# Patient Record
Sex: Female | Born: 1965 | Race: White | Hispanic: No | Marital: Married | Smoking: Never smoker
Health system: Southern US, Community
[De-identification: ages and names within clinical notes are randomized; demographics above are authoritative.]

## PROBLEM LIST (undated history)

## (undated) DIAGNOSIS — T884XXA Failed or difficult intubation, initial encounter: Secondary | ICD-10-CM

## (undated) DIAGNOSIS — Z803 Family history of malignant neoplasm of breast: Secondary | ICD-10-CM

## (undated) DIAGNOSIS — Z87442 Personal history of urinary calculi: Secondary | ICD-10-CM

## (undated) DIAGNOSIS — T7840XA Allergy, unspecified, initial encounter: Secondary | ICD-10-CM

## (undated) DIAGNOSIS — F410 Panic disorder [episodic paroxysmal anxiety] without agoraphobia: Secondary | ICD-10-CM

## (undated) DIAGNOSIS — C50919 Malignant neoplasm of unspecified site of unspecified female breast: Secondary | ICD-10-CM

## (undated) HISTORY — DX: Malignant neoplasm of unspecified site of unspecified female breast: C50.919

## (undated) HISTORY — DX: Family history of malignant neoplasm of breast: Z80.3

## (undated) HISTORY — PX: BREAST BIOPSY: SHX20

## (undated) HISTORY — PX: COLONOSCOPY: SHX174

## (undated) HISTORY — DX: Allergy, unspecified, initial encounter: T78.40XA

## (undated) HISTORY — DX: Panic disorder (episodic paroxysmal anxiety): F41.0

---

## 1997-09-13 ENCOUNTER — Other Ambulatory Visit: Admission: RE | Admit: 1997-09-13 | Discharge: 1997-09-13 | Payer: Self-pay | Admitting: *Deleted

## 1998-02-13 ENCOUNTER — Other Ambulatory Visit: Admission: RE | Admit: 1998-02-13 | Discharge: 1998-02-13 | Payer: Self-pay | Admitting: *Deleted

## 1998-11-13 ENCOUNTER — Other Ambulatory Visit: Admission: RE | Admit: 1998-11-13 | Discharge: 1998-11-13 | Payer: Self-pay | Admitting: *Deleted

## 1998-11-29 ENCOUNTER — Ambulatory Visit (HOSPITAL_COMMUNITY): Admission: RE | Admit: 1998-11-29 | Discharge: 1998-11-29 | Payer: Self-pay | Admitting: Obstetrics and Gynecology

## 1999-11-12 ENCOUNTER — Other Ambulatory Visit: Admission: RE | Admit: 1999-11-12 | Discharge: 1999-11-12 | Payer: Self-pay | Admitting: *Deleted

## 2000-07-01 ENCOUNTER — Other Ambulatory Visit: Admission: RE | Admit: 2000-07-01 | Discharge: 2000-07-01 | Payer: Self-pay | Admitting: Obstetrics and Gynecology

## 2001-02-02 ENCOUNTER — Inpatient Hospital Stay (HOSPITAL_COMMUNITY): Admission: AD | Admit: 2001-02-02 | Discharge: 2001-02-05 | Payer: Self-pay | Admitting: Obstetrics and Gynecology

## 2001-02-10 ENCOUNTER — Observation Stay (HOSPITAL_COMMUNITY): Admission: AD | Admit: 2001-02-10 | Discharge: 2001-02-10 | Payer: Self-pay | Admitting: Obstetrics and Gynecology

## 2001-03-11 ENCOUNTER — Other Ambulatory Visit: Admission: RE | Admit: 2001-03-11 | Discharge: 2001-03-11 | Payer: Self-pay | Admitting: Obstetrics and Gynecology

## 2002-04-24 ENCOUNTER — Other Ambulatory Visit: Admission: RE | Admit: 2002-04-24 | Discharge: 2002-04-24 | Payer: Self-pay | Admitting: Obstetrics and Gynecology

## 2003-04-26 ENCOUNTER — Other Ambulatory Visit: Admission: RE | Admit: 2003-04-26 | Discharge: 2003-04-26 | Payer: Self-pay | Admitting: Obstetrics and Gynecology

## 2003-05-15 ENCOUNTER — Encounter: Payer: Self-pay | Admitting: Internal Medicine

## 2004-05-08 ENCOUNTER — Other Ambulatory Visit: Admission: RE | Admit: 2004-05-08 | Discharge: 2004-05-08 | Payer: Self-pay | Admitting: Obstetrics and Gynecology

## 2004-08-04 ENCOUNTER — Ambulatory Visit: Payer: Self-pay | Admitting: Internal Medicine

## 2004-08-11 ENCOUNTER — Ambulatory Visit: Payer: Self-pay | Admitting: Internal Medicine

## 2004-09-10 ENCOUNTER — Other Ambulatory Visit: Admission: RE | Admit: 2004-09-10 | Discharge: 2004-09-10 | Payer: Self-pay | Admitting: Obstetrics and Gynecology

## 2005-07-10 ENCOUNTER — Other Ambulatory Visit: Admission: RE | Admit: 2005-07-10 | Discharge: 2005-07-10 | Payer: Self-pay | Admitting: Obstetrics and Gynecology

## 2006-07-09 ENCOUNTER — Ambulatory Visit: Payer: Self-pay | Admitting: Internal Medicine

## 2006-07-09 LAB — CONVERTED CEMR LAB
ALT: 22 units/L (ref 0–40)
AST: 21 units/L (ref 0–37)
Alkaline Phosphatase: 78 units/L (ref 39–117)
BUN: 9 mg/dL (ref 6–23)
Basophils Relative: 0.2 % (ref 0.0–1.0)
Bilirubin, Direct: 0.2 mg/dL (ref 0.0–0.3)
Calcium: 9 mg/dL (ref 8.4–10.5)
Chloride: 107 meq/L (ref 96–112)
Cholesterol: 154 mg/dL (ref 0–200)
Creatinine, Ser: 0.6 mg/dL (ref 0.4–1.2)
Eosinophils Absolute: 0.1 10*3/uL (ref 0.0–0.6)
GFR calc non Af Amer: 117 mL/min
Glucose, Bld: 76 mg/dL (ref 70–99)
HCT: 37.6 % (ref 36.0–46.0)
Hemoglobin: 13 g/dL (ref 12.0–15.0)
Neutro Abs: 3 10*3/uL (ref 1.4–7.7)
Platelets: 295 10*3/uL (ref 150–400)
TSH: 1.35 microintl units/mL (ref 0.35–5.50)

## 2006-10-07 DIAGNOSIS — J309 Allergic rhinitis, unspecified: Secondary | ICD-10-CM | POA: Insufficient documentation

## 2007-02-02 ENCOUNTER — Ambulatory Visit: Payer: Self-pay | Admitting: Internal Medicine

## 2007-02-02 DIAGNOSIS — R002 Palpitations: Secondary | ICD-10-CM | POA: Insufficient documentation

## 2007-02-11 ENCOUNTER — Encounter: Payer: Self-pay | Admitting: Internal Medicine

## 2007-02-11 ENCOUNTER — Ambulatory Visit: Payer: Self-pay

## 2007-02-11 ENCOUNTER — Telehealth: Payer: Self-pay | Admitting: *Deleted

## 2008-03-20 ENCOUNTER — Ambulatory Visit: Payer: Self-pay | Admitting: Internal Medicine

## 2008-03-20 LAB — CONVERTED CEMR LAB
ALT: 16 units/L (ref 0–35)
Albumin: 4.1 g/dL (ref 3.5–5.2)
Alkaline Phosphatase: 69 units/L (ref 39–117)
Basophils Absolute: 0 10*3/uL (ref 0.0–0.1)
Bilirubin, Direct: 0.1 mg/dL (ref 0.0–0.3)
CO2: 31 meq/L (ref 19–32)
Calcium: 9.1 mg/dL (ref 8.4–10.5)
Chloride: 102 meq/L (ref 96–112)
GFR calc non Af Amer: 84 mL/min
Glucose, Urine, Semiquant: NEGATIVE
HDL: 66.5 mg/dL (ref >39.0)
Hemoglobin: 13 g/dL (ref 12.0–15.0)
LDL Cholesterol: 96 mg/dL (ref 0–99)
Neutro Abs: 3.6 10*3/uL (ref 1.4–7.7)
Nitrite: NEGATIVE
Platelets: 300 10*3/uL (ref 150–400)
RDW: 11.8 % (ref 11.5–14.6)
Sodium: 141 meq/L (ref 135–145)
Specific Gravity, Urine: 1.015
Total Bilirubin: 0.8 mg/dL (ref 0.3–1.2)
VLDL: 9 mg/dL (ref 0–40)
WBC: 6.1 10*3/uL (ref 4.5–10.5)

## 2008-03-27 ENCOUNTER — Ambulatory Visit: Payer: Self-pay | Admitting: Internal Medicine

## 2008-03-27 DIAGNOSIS — F410 Panic disorder [episodic paroxysmal anxiety] without agoraphobia: Secondary | ICD-10-CM | POA: Insufficient documentation

## 2008-03-27 DIAGNOSIS — R3129 Other microscopic hematuria: Secondary | ICD-10-CM | POA: Insufficient documentation

## 2008-03-27 LAB — CONVERTED CEMR LAB
Bilirubin Urine: NEGATIVE
Glucose, Urine, Semiquant: NEGATIVE
Nitrite: NEGATIVE
Protein, U semiquant: NEGATIVE
Urobilinogen, UA: NEGATIVE

## 2008-08-07 ENCOUNTER — Encounter: Payer: Self-pay | Admitting: Internal Medicine

## 2008-08-10 ENCOUNTER — Encounter: Payer: Self-pay | Admitting: Internal Medicine

## 2009-01-28 ENCOUNTER — Encounter: Payer: Self-pay | Admitting: Internal Medicine

## 2009-03-26 ENCOUNTER — Encounter: Payer: Self-pay | Admitting: Internal Medicine

## 2009-04-02 ENCOUNTER — Encounter: Payer: Self-pay | Admitting: Internal Medicine

## 2009-04-08 ENCOUNTER — Encounter: Payer: Self-pay | Admitting: Internal Medicine

## 2009-04-26 ENCOUNTER — Encounter: Payer: Self-pay | Admitting: Internal Medicine

## 2009-05-02 ENCOUNTER — Encounter: Payer: Self-pay | Admitting: Internal Medicine

## 2009-05-02 ENCOUNTER — Ambulatory Visit (HOSPITAL_BASED_OUTPATIENT_CLINIC_OR_DEPARTMENT_OTHER): Admission: RE | Admit: 2009-05-02 | Discharge: 2009-05-02 | Payer: Self-pay | Admitting: General Surgery

## 2009-05-17 ENCOUNTER — Encounter: Payer: Self-pay | Admitting: Internal Medicine

## 2009-09-13 ENCOUNTER — Encounter: Payer: Self-pay | Admitting: Internal Medicine

## 2009-10-07 ENCOUNTER — Encounter: Payer: Self-pay | Admitting: Internal Medicine

## 2009-10-30 ENCOUNTER — Encounter: Admission: RE | Admit: 2009-10-30 | Discharge: 2009-10-30 | Payer: Self-pay | Admitting: Radiology

## 2010-04-24 NOTE — Letter (Signed)
Summary: [facility]   Imported By: Maryln Gottron 06/12/2009 10:52:02  _____________________________________________________________________  External Attachment:    Type:   Image     Comment:   External Document

## 2010-04-24 NOTE — Op Note (Signed)
Summary: Specimen Radiograph, Needle Localization-Addended Report  Specimen Radiograph, Needle Localization-Addended Report   Imported By: Maryln Gottron 05/07/2009 11:28:16  _____________________________________________________________________  External Attachment:    Type:   Image     Comment:   External Document

## 2010-04-24 NOTE — Op Note (Signed)
Summary: Specimen Radiograph, Needle Localization/[facility]  Specimen Radiograph, Needle Localization/[facility]   Imported By: Maryln Gottron 05/07/2009 11:21:11  _____________________________________________________________________  External Attachment:    Type:   Image     Comment:   External Document

## 2010-04-24 NOTE — Op Note (Signed)
Summary: Stereotactic Biopsy/[facility]  Stereotactic Biopsy/[facility]   Imported By: Maryln Gottron 04/12/2009 15:02:34  _____________________________________________________________________  External Attachment:    Type:   Image     Comment:   External Document

## 2010-04-24 NOTE — Letter (Signed)
Summary: [facility]   Imported By: Maryln Gottron 05/20/2009 15:30:55  _____________________________________________________________________  External Attachment:    Type:   Image     Comment:   External Document

## 2010-05-20 ENCOUNTER — Ambulatory Visit (INDEPENDENT_AMBULATORY_CARE_PROVIDER_SITE_OTHER): Payer: 59 | Admitting: Internal Medicine

## 2010-05-20 ENCOUNTER — Encounter: Payer: Self-pay | Admitting: Internal Medicine

## 2010-05-20 VITALS — BP 110/60 | HR 72 | Temp 98.3°F | Resp 14 | Ht 67.0 in | Wt 144.0 lb

## 2010-05-20 DIAGNOSIS — N9489 Other specified conditions associated with female genital organs and menstrual cycle: Secondary | ICD-10-CM

## 2010-05-20 DIAGNOSIS — J309 Allergic rhinitis, unspecified: Secondary | ICD-10-CM

## 2010-05-20 DIAGNOSIS — R102 Pelvic and perineal pain: Secondary | ICD-10-CM

## 2010-05-20 DIAGNOSIS — F410 Panic disorder [episodic paroxysmal anxiety] without agoraphobia: Secondary | ICD-10-CM

## 2010-05-20 LAB — POCT URINALYSIS DIPSTICK
Glucose, UA: NEGATIVE
Ketones, UA: NEGATIVE
Leukocytes, UA: NEGATIVE
Nitrite, UA: NEGATIVE
Urobilinogen, UA: 1
pH, UA: 7

## 2010-05-20 NOTE — Assessment & Plan Note (Signed)
Controlled  Under care Dr Nolen Mu

## 2010-05-20 NOTE — Assessment & Plan Note (Addendum)
?   Rectal pressure ? Pelvic pressure but no bowel or bladder dysfunction at present.    Rectal exam normal . / if radiating from back and atypical  Or some type of prolapse.   Get her  Gyne to get her exam    And then go from there  .

## 2010-05-20 NOTE — Patient Instructions (Signed)
Will facilitate appt with your gyne to evaluate this problem .

## 2010-05-20 NOTE — Assessment & Plan Note (Signed)
Quiescent

## 2010-05-20 NOTE — Progress Notes (Signed)
  Subjective:    Patient ID: Amanda Williams, female    DOB: [birth year], 45 y.o.   MRN: [id number]  HPI Patient comes in today  For a new problem Her last visit was  Over a year ago .  She has had no change in med status.   Sees GYne Dr Estanislado Pandy    Poss last fall.  Has had insidious onset of  Pelvic pressure pain after sitting and and end of day only relieved by laying down.  No radiation  More rectal than abd area .   Better in am .  NO rx noted .    Stable anxiety panic on zoloft  Review of Systems Neg cv pulm Gi changes  . Periods and no urinary changes.   No blood in stool. No back pain or neuro sx .  rst of ros [place]  Has hx of microscopic  Hematuria. .    Objective:   Physical Exam WDWN in nad  HEENT no Williams abnormality NEck no masses  Or tenderness Chest:  Clear to A&P without wheezes rales or rhonchi CV:  S1-S2 no gallops or murmurs peripheral perfusion is normal Abdomen:  Sof,t normal bowel sounds without hepatosplenomegaly, no guarding rebound or masses no CVA tenderness no heernia Rectal no  Masses or prolapse   Stool neg  Hem  Nl tone Neuro  Non focal and nl weakness MS back non tender  No atrophy .     Assessment & Plan:  Pelvic perineal pressure sx   / cause   associated with sitting long times   ? If atypical back radiation  R/o pelvic process.    Sx  Strategy   Get her Gyne to evaluated.

## 2010-06-03 ENCOUNTER — Other Ambulatory Visit: Payer: Self-pay | Admitting: Dermatology

## 2010-06-12 LAB — BASIC METABOLIC PANEL
BUN: 8 mg/dL (ref 6–23)
Chloride: 104 mEq/L (ref 96–112)

## 2010-06-12 LAB — DIFFERENTIAL
Basophils Relative: 0 % (ref 0–1)
Eosinophils Absolute: 0 10*3/uL (ref 0.0–0.7)
Lymphocytes Relative: 38 % (ref 12–46)
Lymphs Abs: 2.6 10*3/uL (ref 0.7–4.0)
Neutro Abs: 3.8 10*3/uL (ref 1.7–7.7)

## 2010-06-12 LAB — CBC
HCT: 37.2 % (ref 36.0–46.0)
MCV: 90.4 fL (ref 78.0–100.0)
Platelets: 285 10*3/uL (ref 150–400)
RDW: 12.8 % (ref 11.5–15.5)
WBC: 6.9 10*3/uL (ref 4.0–10.5)

## 2010-07-16 ENCOUNTER — Other Ambulatory Visit: Payer: Self-pay | Admitting: Dermatology

## 2010-08-08 NOTE — H&P (Signed)
[facility]  Patient:    Amanda Williams, Amanda Williams Visit Number: [id number] MRN: [id number]          Service Type: OBS Location: [id number] Attending Physician:  Lenoard Aden Dictated by:   Lenoard Aden, M.D. Admit Date:  02/02/2001   CC:         [facility]   History and Physical  CHIEF COMPLAINT:              Labor.  HISTORY OF PRESENT ILLNESS:   A 45 year old white female, G3, P1, EDD January 26, 2001, at [redacted] weeks gestation who presents in active labor.  Past obstetrical history remarkable for vacuum-assisted vaginal delivery of a female in 1998, [facility] for partial molar pregnancy in September 2000.  ALLERGIES:                    PENICILLIN and SULFA.  MEDICATIONS:                  Prenatal vitamins.  PRENATAL LAB COURSE:          Otherwise uncomplicated.  Patient is advanced maternal age and declined amniocentesis, had a normal triple screen and 20-week ultrasound.  Prenatal lab data reveals a blood type of O positive, Rh antibody negative, rubella immune, hepatitis B surface antigen negative, HA nonreactive.  GC and Chlamydia negative.  GBS negative.  FAMILY HISTORY:               Noncontributory.  SOCIAL HISTORY:               Noncontributory.  PHYSICAL EXAMINATION:  GENERAL:                      She is a well-developed, well-nourished white female in no apparent distress.  HEENT:                        Normal.  LUNGS:                        Clear.  HEART:                        Regular rhythm.  ABDOMEN:                      Soft, gravid, nontender.  Estimated fetal weight on ultrasound 8-1/2 pounds.  PELVIC:                       Cervix is 8 cm, 100% vertex, and 0.  Artificial rupture of membranes clear.  EXTREMITIES:                  No cords.  NEUROLOGIC:                   Exam is nonfocal.  IMPRESSION:                   A 41-week intrauterine pregnancy in active labor.  PLAN:                         Admit and  anticipate attempts at vaginal delivery. Dictated by:   Lenoard Aden, M.D. Attending Physician:  Lenoard Aden DD:  02/02/01 TD:  02/02/01 Job: [id number]

## 2010-10-10 ENCOUNTER — Other Ambulatory Visit: Payer: Self-pay

## 2010-10-10 DIAGNOSIS — Z0289 Encounter for other administrative examinations: Secondary | ICD-10-CM

## 2010-10-17 ENCOUNTER — Ambulatory Visit (INDEPENDENT_AMBULATORY_CARE_PROVIDER_SITE_OTHER): Payer: 59 | Admitting: Internal Medicine

## 2010-10-17 ENCOUNTER — Encounter: Payer: Self-pay | Admitting: Internal Medicine

## 2010-10-17 VITALS — BP 100/70 | HR 76 | Temp 98.4°F | Ht 66.75 in | Wt 142.0 lb

## 2010-10-17 DIAGNOSIS — Z0000 Encounter for general adult medical examination without abnormal findings: Secondary | ICD-10-CM

## 2010-10-17 DIAGNOSIS — R922 Inconclusive mammogram: Secondary | ICD-10-CM

## 2010-10-17 LAB — POCT URINALYSIS DIPSTICK
Bilirubin, UA: NEGATIVE
Glucose, UA: NEGATIVE
Leukocytes, UA: NEGATIVE
Protein, UA: NEGATIVE
Spec Grav, UA: 1.01

## 2010-10-17 LAB — CBC WITH DIFFERENTIAL/PLATELET
Eosinophils Absolute: 0.1 10*3/uL (ref 0.0–0.7)
Eosinophils Relative: 1.2 % (ref 0.0–5.0)
HCT: 38.4 % (ref 36.0–46.0)
Monocytes Absolute: 0.4 10*3/uL (ref 0.1–1.0)
Neutro Abs: 3.9 10*3/uL (ref 1.4–7.7)
Neutrophils Relative %: 61.5 % (ref 43.0–77.0)
RBC: 4.24 Mil/uL (ref 3.87–5.11)
RDW: 13.3 % (ref 11.5–14.6)

## 2010-10-17 LAB — BASIC METABOLIC PANEL
CO2: 28 mEq/L (ref 19–32)
Calcium: 8.6 mg/dL (ref 8.4–10.5)
Creatinine, Ser: 0.7 mg/dL (ref 0.4–1.2)
GFR: 92.9 mL/min (ref >60.00)
Glucose, Bld: 92 mg/dL (ref 70–99)

## 2010-10-17 LAB — LIPID PANEL
Cholesterol: 162 mg/dL (ref 0–200)
LDL Cholesterol: 94 mg/dL (ref 0–99)
Total CHOL/HDL Ratio: 3
VLDL: 11.8 mg/dL (ref 0.0–40.0)

## 2010-10-17 LAB — HEPATIC FUNCTION PANEL
ALT: 13 U/L (ref 0–35)
AST: 16 U/L (ref 0–37)
Alkaline Phosphatase: 81 U/L (ref 39–117)

## 2010-10-17 LAB — TSH: TSH: 1.2 u[IU]/mL (ref 0.35–5.50)

## 2010-10-17 NOTE — Progress Notes (Signed)
  Subjective:    Patient ID: Amanda Williams, female    DOB: Oct 09, 1965, 45 y.o.   MRN: [id number]  HPI Patient comes in today for Preventive Health Care visit  Since last visit no major changes in health. Gets mamo for dense breast and b q 6 months and then early  To get gyne check and pap in fall. Pelvic pressure got better  Has had eval and  blood in ua    Sees DR Nolen Mu yearly and doing well on same dose of zoloft. Review of Systems ROS:  GEN/ HEENTNo fever, significant weight changes sweats headaches vision problems hearing changes, CV/ PULM; No chest pain shortness of breath cough, syncope,edema  change in exercise tolerance. GI /GU: No adominal pain, vomiting, change in bowel habits. No blood in the stool. No significant GU symptoms. SKIN/HEME: ,no acute skin rashes suspicious lesions or bleeding. No lymphadenopathy, nodules, masses.  NEURO/ PSYCH:  No neurologic signs such as weakness numbness No depression anxiety. IMM/ Allergy: No unusual infections.  Allergy .  Mild hay fever.  REST of 12 system review negative  Past history family history social history reviewed in the electronic medical record.     Objective:   Physical Exam Physical Exam: Vital signs reviewed WUJ:WJXB is a well-developed well-nourished alert cooperative  white female who appears her stated age in no acute distress.  HEENT: normocephalic  traumatic , Eyes: PERRL EOM's full, conjunctiva clear, Nares: paten,t no deformity discharge or tenderness., Ears: no deformity EAC's clear TMs with normal landmarks. Mouth: clear OP, no lesions, edema.  Moist mucous membranes. Dentition in adequate repair. NECK: supple without masses, thyromegaly or bruits. CHEST/PULM:  Clear to auscultation and percussion breath sounds equal no wheeze , rales or rhonchi. No chest wall deformities or tenderness. Breast: normal by inspection . No dimpling, discharge, masses, tenderness or discharge . Lumpy  Left breast with wellhealed scar   CV: PMI is nondisplaced, S1 S2 no gallops, murmurs, rubs. Peripheral pulses are full without delay.No JVD .  ABDOMEN: Bowel sounds normal nontender  No guard or rebound, no hepato splenomegal no CVA tenderness.  No hernia. Extremtities:  No clubbing cyanosis or edema, no acute joint swelling or redness no focal atrophy NEURO:  Oriented x3, cranial nerves 3-12 appear to be intact, no obvious focal weakness,gait within normal limits no abnormal reflexes or asymmetrical SKIN: No acute rashes normal turgor, color, no bruising or petechiae. Sun changes  PSYCH: Oriented, good eye contact, no obvious depression anxiety, cognition and judgment appear normal. LN: no cervical axillary inguinal adenopathy     Assessment & Plan:   Preventive Health Care Counseled regarding healthy nutrition, exercise, sleep, injury prevention, calcium vit d and healthy weight . UTD mammo pap and tdap Hx of dense breast.   To call uro for appropriate fu visit when due Depression stable  On meds

## 2010-10-17 NOTE — Patient Instructions (Signed)
Will notify you  of labs when available. Continue lifestyle intervention healthy eating and add  exercise .  Check up in 1-2 years  And reg GYNE check.

## 2010-10-18 ENCOUNTER — Encounter: Payer: Self-pay | Admitting: Internal Medicine

## 2010-10-18 DIAGNOSIS — R922 Inconclusive mammogram: Secondary | ICD-10-CM | POA: Insufficient documentation

## 2010-10-18 DIAGNOSIS — Z0000 Encounter for general adult medical examination without abnormal findings: Secondary | ICD-10-CM | POA: Insufficient documentation

## 2010-10-18 DIAGNOSIS — R9230 Dense breasts, unspecified: Secondary | ICD-10-CM | POA: Insufficient documentation

## 2010-10-20 ENCOUNTER — Encounter: Payer: Self-pay | Admitting: *Deleted

## 2011-04-24 ENCOUNTER — Encounter: Payer: Self-pay | Admitting: Internal Medicine

## 2012-01-27 ENCOUNTER — Ambulatory Visit: Payer: 59 | Admitting: Obstetrics and Gynecology

## 2012-04-12 ENCOUNTER — Encounter: Payer: Self-pay | Admitting: Internal Medicine

## 2012-04-12 ENCOUNTER — Ambulatory Visit (INDEPENDENT_AMBULATORY_CARE_PROVIDER_SITE_OTHER): Payer: BC Managed Care – PPO | Admitting: Internal Medicine

## 2012-04-12 VITALS — BP 104/76 | HR 63 | Temp 98.0°F | Wt 150.0 lb

## 2012-04-12 DIAGNOSIS — R6889 Other general symptoms and signs: Secondary | ICD-10-CM | POA: Insufficient documentation

## 2012-04-12 LAB — HEPATIC FUNCTION PANEL
ALT: 17 U/L (ref 0–35)
AST: 18 U/L (ref 0–37)
Albumin: 4.2 g/dL (ref 3.5–5.2)

## 2012-04-12 LAB — CBC WITH DIFFERENTIAL/PLATELET
Basophils Absolute: 0.1 10*3/uL (ref 0.0–0.1)
Basophils Relative: 0.9 % (ref 0.0–3.0)
Eosinophils Relative: 1.7 % (ref 0.0–5.0)
HCT: 38.3 % (ref 36.0–46.0)
Lymphocytes Relative: 36 % (ref 12.0–46.0)
MCHC: 32.7 g/dL (ref 30.0–36.0)
MCV: 90.3 fl (ref 78.0–100.0)
Monocytes Absolute: 0.5 10*3/uL (ref 0.1–1.0)
Monocytes Relative: 7.2 % (ref 3.0–12.0)
Neutrophils Relative %: 54.2 % (ref 43.0–77.0)
RDW: 12.9 % (ref 11.5–14.6)

## 2012-04-12 LAB — SEDIMENTATION RATE: Sed Rate: 13 mm/hr (ref 0–22)

## 2012-04-12 LAB — T4, FREE: Free T4: 0.8 ng/dL (ref 0.60–1.60)

## 2012-04-12 NOTE — Progress Notes (Signed)
Chief Complaint  Patient presents with  . throat pressure    HPI: Patient comes in today for SDA for  new problem evaluation. Last visit was 7 12 about 18 months ago . She is generally well sees her OB/GYN and Dr. Nolen Mu yearly for med check. She was in her usual state of health until she insidiously noted. "Feeling like lump in throat" for 3-4 weeks  No breathing eating or  reflis sx  Not going away like a pressure  Not sudden sitting working  As cpA    . No choking no heartburn symptoms cough respiratory infection fever or true dysphasia. She's not sure if it's positional or not she thinks it feels a little better when she gets up in the morning.  On zoloft   For suppression of panic attacks and does hurt well with this. She has not had these symptoms before. She did have heartburn of pregnancy. She's not taking any medicine. Family history is negative for thyroid disease. ROS: See pertinent positives and negatives per HPI. No flare of allergies panic.  Past Medical History  Diagnosis Date  . Allergy   . Panic attack     echo 2009 normal  . Hematuria     ? trigonitis    No family history on file.  History   Social History  . Marital Status: Married    Spouse Name: N/A    Number of Children: N/A  . Years of Education: N/A   Social History Main Topics  . Smoking status: Never Smoker   . Smokeless tobacco: None  . Alcohol Use: None  . Drug Use: None  . Sexually Active: None   Other Topics Concern  . 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 Has smoke detector and wears seat belts.  No firearms. No excess sun exposure. Sees dentist regularly .     Outpatient Encounter Prescriptions as of 04/12/2012  Medication Sig Dispense Refill  . sertraline (ZOLOFT) 100 MG tablet Take 100 mg by mouth daily.          EXAM:  BP 104/76  Pulse 63  Temp 98 F (36.7 C) (Oral)  Wt 150 lb (68.04 kg)   SpO2 97%  LMP 03/25/2012  There is no height on file to calculate BMI.  GENERAL: vitals reviewed and listed above, alert, oriented, appears well hydrated and in no acute distress  HEENT: atraumatic, conjunctiva  clear, no obvious abnormalities on inspection of external nose and ears TMs are intact OP : no lesion edema or exudate  no edema is noted no drainage face nontender  NECK: no obvious masses on inspection   no significant adenopathy no bruit she points to the suprasternal notch with her is somewhat of a fullness but cannot tell if she has thyromegaly the isthmus  no supraclavicular adenopathy LUNGS: clear to auscultation bilaterally, no wheezes, rales or rhonchi, good air movement  CV: HRRR, no clubbing cyanosis or  peripheral edema nl cap refill  Abdomen soft without organomegaly guarding or rebound. MS: moves all extremities without noticeable focal  abnormality  PSYCH: pleasant and cooperative, no obvious depression or anxiety  ASSESSMENT AND PLAN:  Discussed the following assessment and plan:  1. Throat symptom  Basic metabolic panel, CBC with Differential, Hepatic function panel, TSH, T4, free, T3, free, Sedimentation rate, US Soft Tissue Head/Neck  Essentially normal exam except possibly borderline thyroid enlargement check labs today ultrasound if negative we'll follow more evaluation if progressive other    -Patient advised to return or notify health care team  immediately if symptoms worsen or persist or new concerns arise.  Patient Instructions  Your exam is good today.  We will get thyroid tests and blood count and arrange for an ultrasound of the thyroid gland to make sure that is not enlarged as the cause of your symptoms. If these tests are normal we will observe and have you come back in a couple months if it is continuing or progressing.  Consider getting ear nose and throat Dr. For  exam if needed.  Occasionally silent reflux could cause this.  Will notify you   of labs when available. Can sign up for My CHart    Neta Mends. Panosh M.D.

## 2012-04-12 NOTE — Patient Instructions (Addendum)
Your exam is good today.  We will get thyroid tests and blood count and arrange for an ultrasound of the thyroid gland to make sure that is not enlarged as the cause of your symptoms. If these tests are normal we will observe and have you come back in a couple months if it is continuing or progressing.  Consider getting ear nose and throat Dr. For  exam if needed.  Occasionally silent reflux could cause this.  Will notify you  of labs when available. Can sign up for My CHart

## 2012-04-14 ENCOUNTER — Ambulatory Visit: Payer: BC Managed Care – PPO

## 2012-04-14 DIAGNOSIS — R6889 Other general symptoms and signs: Secondary | ICD-10-CM

## 2012-04-14 LAB — BASIC METABOLIC PANEL
BUN: 9 mg/dL (ref 6–23)
CO2: 24 mEq/L (ref 19–32)
Chloride: 106 mEq/L (ref 96–112)
GFR: 93.8 mL/min (ref >60.00)

## 2012-04-14 NOTE — Addendum Note (Signed)
Addended by: Rita Ohara R on: 04/14/2012 09:59 AM   Modules accepted: Orders

## 2012-04-15 ENCOUNTER — Encounter: Payer: Self-pay | Admitting: Family Medicine

## 2012-04-15 ENCOUNTER — Ambulatory Visit
Admission: RE | Admit: 2012-04-15 | Discharge: 2012-04-15 | Disposition: A | Discharge status: Home / Self Care | Payer: BC Managed Care – PPO | Source: Ambulatory Visit | Attending: Internal Medicine | Admitting: Internal Medicine

## 2012-04-15 DIAGNOSIS — R6889 Other general symptoms and signs: Secondary | ICD-10-CM

## 2012-04-18 ENCOUNTER — Encounter: Payer: Self-pay | Admitting: Family Medicine

## 2012-04-19 ENCOUNTER — Ambulatory Visit: Payer: 59 | Admitting: Obstetrics and Gynecology

## 2012-08-03 ENCOUNTER — Telehealth: Payer: Self-pay | Admitting: Internal Medicine

## 2012-08-03 NOTE — Telephone Encounter (Signed)
PT stated that she would like to obtain an antibiotic prior to traveling to [place]. Please assist.

## 2012-08-03 NOTE — Telephone Encounter (Signed)
The patient will need to be seen in the office.  Was seen once recently for a sore throat and not seen before that since 2012.  Please call the patient and make this appt.  Thanks!!

## 2012-08-04 ENCOUNTER — Encounter: Payer: Self-pay | Admitting: Internal Medicine

## 2012-08-04 ENCOUNTER — Ambulatory Visit (INDEPENDENT_AMBULATORY_CARE_PROVIDER_SITE_OTHER): Payer: BC Managed Care – PPO | Admitting: Internal Medicine

## 2012-08-04 VITALS — BP 110/76 | HR 59 | Temp 98.1°F | Wt 148.0 lb

## 2012-08-04 DIAGNOSIS — Z209 Contact with and (suspected) exposure to unspecified communicable disease: Secondary | ICD-10-CM

## 2012-08-04 DIAGNOSIS — Z7184 Encounter for health counseling related to travel: Secondary | ICD-10-CM

## 2012-08-04 DIAGNOSIS — Z7189 Other specified counseling: Secondary | ICD-10-CM

## 2012-08-04 DIAGNOSIS — Z299 Encounter for prophylactic measures, unspecified: Secondary | ICD-10-CM

## 2012-08-04 DIAGNOSIS — Z23 Encounter for immunization: Secondary | ICD-10-CM

## 2012-08-04 MED ORDER — CIPROFLOXACIN HCL 500 MG PO TABS: 500.0000 mg | ORAL_TABLET | Freq: Two times a day (BID) | ORAL | Status: DC

## 2012-08-04 NOTE — Progress Notes (Signed)
Chief Complaint  Patient presents with  . Patient leaving the country.    Going to [place] for vacation.    HPI: Patient comes in today for evaluation for travel. He is to go to [place] for about 10 days beginning the end of the month. She looked on the website at the [facility]. They're not going [place] but staying at [employer] and diving. Wanted to check immunizations and other advice. She's doing well this is a anniversary trip.  Has not had hepatitis A vaccine uncertain if she her had hepatitis B vaccine.  Continuing same medicines sertraline. No new medication. Is allergic to sulfa and penicillin doesn't know if she's ever had Cipro ROS: See pertinent positives and negatives per HPI.  Past Medical History  Diagnosis Date  . Allergy   . Panic attack     echo 2009 normal  . Hematuria     ? trigonitis    No family history on file.  History   Social History  . Marital Status: Married    Spouse Name: N/A    Number of Children: N/A  . Years of Education: N/A   Social History Main Topics  . Smoking status: Never Smoker   . Smokeless tobacco: None  . Alcohol Use: None  . Drug Use: None  . Sexually Active: None   Other Topics Concern  . None   Social History Narrative   hhof  4    Pets dog and cat.   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    Has smoke detector and wears seat belts.  No firearms. No excess sun exposure. Sees dentist regularly .           Outpatient Encounter Prescriptions as of 08/04/2012  Medication Sig Dispense Refill  . ciprofloxacin (CIPRO) 500 MG tablet Take 1 tablet (500 mg total) by mouth 2 (two) times daily. If needed for travelers diarrhea.  6 tablet  0  . sertraline (ZOLOFT) 100 MG tablet Take 100 mg by mouth daily.        No facility-administered encounter medications on file as of 08/04/2012.    EXAM:  BP 110/76  Pulse 59  Temp(Src) 98.1 F (36.7 C) (Oral)  Wt  148 lb (67.132 kg)  BMI 23.37 kg/m2  SpO2 98%  LMP 07/29/2012  Body mass index is 23.37 kg/(m^2).  GENERAL: vitals reviewed and listed above, alert, oriented, appears well hydrated and in no acute distress  MPSYCH: pleasant and cooperative, no obvious depression or anxiety  ASSESSMENT AND PLAN:  Discussed the following assessment and plan:  Need for hepatitis A and B vaccination - Plan: Hepatitis A hepatitis B combined vaccine IM  -Patient advised to return or notify health care team  if symptoms worsen or persist or new concerns arise.  Patient Instructions  Can begin antibiotic if needed for  Travelers diarrhea   Begin twin rix   Hep a and hep b    Series of 3      Travelers' Diarrhea Travelers' diarrhea (TD) is the most common illness affecting travelers. Each year many travelers develop diarrhea. TD usually occurs within the first week of travel. However, it may occur at any time while traveling. It may even occur after returning home. The most important risk factor is where you are going. High-risk places are the developing  countries of:  [place].  [place].  The [place].  [place]. High risk people include young adults and those with:  Transplants.  HIV infections.  Medicine that suppresses the immune system.  Inflammatory-bowel disease.  Diabetes.  H-2 blockers or antacids. Attack rates are similar for men and women. The primary source of TD is eating or drinking food or water tainted with feces (stool or bowel movements). CAUSES  Infectious agents are the primary cause of TD. Germs cause almost 80% of TD cases. The most common germ produces:  Watery diarrhea with cramps.  Low-grade or no fever. There are many other bacterial, viral and parasitic pathogens (disease causing "bugs").  SYMPTOMS  Most TD cases begin suddenly. Symptoms include stool that is increased in:  Frequency.  Volume.  Weight. Altered stool consistency also is common.  Typically, you have four to five loose or watery bowel movements each day. Other common symptoms are:  Nausea.  Vomiting.  Diarrhea.  Abdominal cramping.  Bloating.  Fever.  Urgency.  Malaise. Most cases are not dangerous. Most cases go away in 1-2 days without treatment. TD is rarely life threatening. 90% of cases resolve within 1 week. 98% resolve within 1 month. PREVENTION   Avoid foods or beverages purchased from street vendors in high risk countries.  Avoid food from places where unclean conditions are present.  Avoid raw or undercooked meat and seafood.  Avoid raw fruits (e.g., oranges, bananas, avocados) and vegetables unless you peel them yourself.  If handled properly, well-cooked and packaged foods usually are safe. Foods associated with increased risk for TD include:  Tap water.  Ice.  Unpasteurized milk.  Dairy products.  Safe beverages include:  Bottled carbonated beverages.  Hot tea or coffee.  Beer.  Wine.  Boiled water.  Water treated with iodine or chlorine. ANTIBIOTICS ARE NOT RECOMMENDED AS PREVENTION  CDC (Centers for Disease Control) does not recommend antimicrobial drugs (medicine that kill germs) to prevent TD. Several studies show that Pepto-Bismol taken as either 2 tablets 4 times daily, or 2 fluid ounces 4 times daily, reduces the incidence of travelers' diarrhea. People that should avoid Pepto-Bismol include those who are:  Pregnant.  Allergic to aspirin.  Taking anticoagulants medicine (probenecid, methotrexate).  Be informed about potential side effects, in particular about temporary blackening of the tongue and stool, and rarely ringing in the ears. Because of potential adverse side effects, preventative Pepto-Bismol should not be used for more than 3 weeks.  Some antibiotics taken in a once-a-day dose are 90% effective at preventing travelers' diarrhea. However, antibiotics are not recommended as prevention. Routine  antimicrobial prophylaxis increases your risk for:  Adverse reactions.  Infections with resistant organisms.  Antibiotics can increase your susceptibility to resistant bacterial pathogens and provide no protection against either viral or parasitic pathogens. This can give travelers a false sense of security. As a result, strict adherence to preventive measures is encouraged. Pepto-Bismol should be used as an extra effort if prophylaxis is needed. TREATMENT   TD usually is a self-limited disorder. It gets well without treatment. It often goes away without specific treatment. Oral re-hydration is often helpful to replace lost fluids and electrolytes. Clear liquids are routinely recommended for adults. You may be helped with antimicrobial therapy if you develop three or more loose stools in an 8-hour period, especially if associated with:  Nausea.  Vomiting.  Abdominal cramps.  Fever.  Blood in stools.  Antibiotics usually are given for 3-5 days. Pepto-Bismol also may be  used as treatment. Take one fluid ounce, or two 262 mg tablets every 30 minutes, for up to 8 doses in a 24-hour period. This can be repeated on a second day. If diarrhea persists despite therapy, you should be evaluated by a caregiver and treated for possible parasitic infection.  Because drug resistance is a continuing problem and may vary from country to country, professional assistance should be looked for if problems persist.  Antimotility agents (loperamide, diphenoxylate, and paregoric) mostly reduce diarrhea by slowing down the passage of food and drink in the gut. This allows more time for absorption. Some persons believe diarrhea is the body's defense mechanism to minimize contact time between gut pathogens and lining of the bowel. In several studies, antimotility agents have been useful in treating travelers' diarrhea by decreasing the duration of diarrhea. However, these agents should never be used by persons with  fever or bloody diarrhea because they can increase the severity of disease by delaying clearance of causative organisms. Because antimotility agents are now available over the counter, their improper use is of concern. Complications have been reported from the use of these medicines such as:  Toxic megacolon.  Sepsis.  Disseminated intravascular coagulation. SEEK IMMEDIATE MEDICAL CARE IF:   You are unable to keep fluids down.  Vomiting or diarrhea becomes persistent.  Abdominal (belly) pain develops or increases or localizes. (Right sided pain can be appendicitis and left sided pain in adults can be diverticulitis).  You develop an oral temperature above 102 F (38.9 C), or as your caregiver suggests.  Diarrhea becomes excessive or contains blood or mucous.  Excessive weakness, dizziness, fainting or extreme thirst.  Checking weight 2 to 3 times per day in babies and children will help verify adequate fluid replacement. Your caregiver will tell you what loss should concern you or suggest another visit to your personal physician.  Record your weight or your child's weight today. Compare this to your home scale and record all weights and time and date weighed. Try to check weight at the same times every day. Bring this chart to your caregivers if you or your child needs to be seen again. FOR MORE INFORMATION  Travelers should consult with a caregiver before departing on a trip abroad. Information about TD is available from:  Your local or state health departments.  World Science writer [facility]). Other information that may be of interest to travelers can be found at the [facility] Travelers' Health homepage at [employer].gl. Document Released: 02/27/2002 Document Revised: 06/01/2011 Document Reviewed: 05/17/2008 [facility] Patient Information 2013 [place], [place].      Neta Mends. Simcha Farrington M.D.  Total visit > 50% spent counseling and coordinating care

## 2012-08-04 NOTE — Patient Instructions (Signed)
Can begin antibiotic if needed for  Travelers diarrhea   Begin twin rix   Hep a and hep b    Series of 3      Travelers' Diarrhea Travelers' diarrhea (TD) is the most common illness affecting travelers. Each year many travelers develop diarrhea. TD usually occurs within the first week of travel. However, it may occur at any time while traveling. It may even occur after returning home. The most important risk factor is where you are going. High-risk places are the developing countries of:  [place].  [place].  [place].  [place]. High risk people include young adults and those with:  Transplants.  HIV infections.  Medicine that suppresses the immune system.  Inflammatory-bowel disease.  Diabetes.  H-2 blockers or antacids. Attack rates are similar for men and women. The primary source of TD is eating or drinking food or water tainted with feces (stool or bowel movements). CAUSES  Infectious agents are the primary cause of TD. Germs cause almost 80% of TD cases. The most common germ produces:  Watery diarrhea with cramps.  Low-grade or no fever. There are many other bacterial, viral and parasitic pathogens (disease causing "bugs").  SYMPTOMS  Most TD cases begin suddenly. Symptoms include stool that is increased in:  Frequency.  Volume.  Weight. Altered stool consistency also is common. Typically, you have four to five loose or watery bowel movements each day. Other common symptoms are:  Nausea.  Vomiting.  Diarrhea.  Abdominal cramping.  Bloating.  Fever.  Urgency.  Malaise. Most cases are not dangerous. Most cases go away in 1-2 days without treatment. TD is rarely life threatening. 90% of cases resolve within 1 week. 98% resolve within 1 month. PREVENTION   Avoid foods or beverages purchased from street vendors in high risk countries.  Avoid food from places where unclean conditions are present.  Avoid raw or undercooked meat and  seafood.  Avoid raw fruits (e.g., oranges, bananas, avocados) and vegetables unless you peel them yourself.  If handled properly, well-cooked and packaged foods usually are safe. Foods associated with increased risk for TD include:  Tap water.  Ice.  Unpasteurized milk.  Dairy products.  Safe beverages include:  Bottled carbonated beverages.  Hot tea or coffee.  Beer.  Wine.  Boiled water.  Water treated with iodine or chlorine. ANTIBIOTICS ARE NOT RECOMMENDED AS PREVENTION  CDC (Centers for Disease Control) does not recommend antimicrobial drugs (medicine that kill germs) to prevent TD. Several studies show that Pepto-Bismol taken as either 2 tablets 4 times daily, or 2 fluid ounces 4 times daily, reduces the incidence of travelers' diarrhea. People that should avoid Pepto-Bismol include those who are:  Pregnant.  Allergic to aspirin.  Taking anticoagulants medicine (probenecid, methotrexate).  Be informed about potential side effects, in particular about temporary blackening of the tongue and stool, and rarely ringing in the ears. Because of potential adverse side effects, preventative Pepto-Bismol should not be used for more than 3 weeks.  Some antibiotics taken in a once-a-day dose are 90% effective at preventing travelers' diarrhea. However, antibiotics are not recommended as prevention. Routine antimicrobial prophylaxis increases your risk for:  Adverse reactions.  Infections with resistant organisms.  Antibiotics can increase your susceptibility to resistant bacterial pathogens and provide no protection against either viral or parasitic pathogens. This can give travelers a false sense of security. As a result, strict adherence to preventive measures is encouraged. Pepto-Bismol should be used as an extra effort if prophylaxis  is needed. TREATMENT   TD usually is a self-limited disorder. It gets well without treatment. It often goes away without specific  treatment. Oral re-hydration is often helpful to replace lost fluids and electrolytes. Clear liquids are routinely recommended for adults. You may be helped with antimicrobial therapy if you develop three or more loose stools in an 8-hour period, especially if associated with:  Nausea.  Vomiting.  Abdominal cramps.  Fever.  Blood in stools.  Antibiotics usually are given for 3-5 days. Pepto-Bismol also may be used as treatment. Take one fluid ounce, or two 262 mg tablets every 30 minutes, for up to 8 doses in a 24-hour period. This can be repeated on a second day. If diarrhea persists despite therapy, you should be evaluated by a caregiver and treated for possible parasitic infection.  Because drug resistance is a continuing problem and may vary from country to country, professional assistance should be looked for if problems persist.  Antimotility agents (loperamide, diphenoxylate, and paregoric) mostly reduce diarrhea by slowing down the passage of food and drink in the gut. This allows more time for absorption. Some persons believe diarrhea is the body's defense mechanism to minimize contact time between gut pathogens and lining of the bowel. In several studies, antimotility agents have been useful in treating travelers' diarrhea by decreasing the duration of diarrhea. However, these agents should never be used by persons with fever or bloody diarrhea because they can increase the severity of disease by delaying clearance of causative organisms. Because antimotility agents are now available over the counter, their improper use is of concern. Complications have been reported from the use of these medicines such as:  Toxic megacolon.  Sepsis.  Disseminated intravascular coagulation. SEEK IMMEDIATE MEDICAL CARE IF:   You are unable to keep fluids down.  Vomiting or diarrhea becomes persistent.  Abdominal (belly) pain develops or increases or localizes. (Right sided pain can be  appendicitis and left sided pain in adults can be diverticulitis).  You develop an oral temperature above 102 F (38.9 C), or as your caregiver suggests.  Diarrhea becomes excessive or contains blood or mucous.  Excessive weakness, dizziness, fainting or extreme thirst.  Checking weight 2 to 3 times per day in babies and children will help verify adequate fluid replacement. Your caregiver will tell you what loss should concern you or suggest another visit to your personal physician.  Record your weight or your child's weight today. Compare this to your home scale and record all weights and time and date weighed. Try to check weight at the same times every day. Bring this chart to your caregivers if you or your child needs to be seen again. FOR MORE INFORMATION  Travelers should consult with a caregiver before departing on a trip abroad. Information about TD is available from:  Your local or state health departments.  World [employer] writer [facility]). Other information that may be of interest to travelers can be found at the [facility] Travelers' Health homepage at [email or web address]. Document Released: 02/27/2002 Document Revised: 06/01/2011 Document Reviewed: 05/17/2008 [facility] Patient Information 2013 [place], [place].

## 2012-12-28 ENCOUNTER — Encounter: Payer: Self-pay | Admitting: Internal Medicine

## 2013-07-28 LAB — HM MAMMOGRAPHY

## 2013-07-31 ENCOUNTER — Encounter: Payer: Self-pay | Admitting: Internal Medicine

## 2015-08-12 LAB — HM PAP SMEAR

## 2016-05-05 ENCOUNTER — Telehealth: Payer: Self-pay | Admitting: Internal Medicine

## 2016-05-05 NOTE — Telephone Encounter (Signed)
Pt would like to see if you would continue to see her has not been seen since 2014 and state that Dr. Regis Bill said that she did not have to come back every year just only when she needs a CPE. So now she is needing a CPE is it okay to schedule.  Pt would like to have a call.

## 2016-05-05 NOTE — Telephone Encounter (Signed)
Ok to do cpx     Have her bring any   Info  Immunizations  paps or other relevant information tha may have been done elsewhere

## 2016-05-06 NOTE — Telephone Encounter (Signed)
Pt scheduled and will supply information that Dr. Regis Bill requested.

## 2016-05-12 ENCOUNTER — Other Ambulatory Visit (INDEPENDENT_AMBULATORY_CARE_PROVIDER_SITE_OTHER): Payer: 59

## 2016-05-12 DIAGNOSIS — Z0000 Encounter for general adult medical examination without abnormal findings: Secondary | ICD-10-CM | POA: Diagnosis not present

## 2016-05-12 LAB — CBC WITH DIFFERENTIAL/PLATELET
BASOS ABS: 0.1 10*3/uL (ref 0.0–0.1)
Basophils Relative: 0.6 % (ref 0.0–3.0)
Eosinophils Absolute: 0.1 10*3/uL (ref 0.0–0.7)
Eosinophils Relative: 1.3 % (ref 0.0–5.0)
HCT: 38.8 % (ref 36.0–46.0)
HEMOGLOBIN: 13.2 g/dL (ref 12.0–15.0)
LYMPHS ABS: 3.4 10*3/uL (ref 0.7–4.0)
LYMPHS PCT: 41.3 % (ref 12.0–46.0)
MCHC: 34.1 g/dL (ref 30.0–36.0)
MCV: 88.3 fl (ref 78.0–100.0)
MONOS PCT: 6 % (ref 3.0–12.0)
Monocytes Absolute: 0.5 10*3/uL (ref 0.1–1.0)
NEUTROS PCT: 50.8 % (ref 43.0–77.0)
Neutro Abs: 4.2 10*3/uL (ref 1.4–7.7)
Platelets: 329 10*3/uL (ref 150.0–400.0)
RBC: 4.39 Mil/uL (ref 3.87–5.11)
RDW: 12.4 % (ref 11.5–15.5)
WBC: 8.2 10*3/uL (ref 4.0–10.5)

## 2016-05-12 LAB — BASIC METABOLIC PANEL
BUN: 10 mg/dL (ref 6–23)
CALCIUM: 8.9 mg/dL (ref 8.4–10.5)
CO2: 28 mEq/L (ref 19–32)
Chloride: 101 mEq/L (ref 96–112)
Creatinine, Ser: 0.74 mg/dL (ref 0.40–1.20)
GFR: 87.92 mL/min (ref >60.00)
GLUCOSE: 95 mg/dL (ref 70–99)
POTASSIUM: 4.2 meq/L (ref 3.5–5.1)
SODIUM: 137 meq/L (ref 135–145)

## 2016-05-12 LAB — HEPATIC FUNCTION PANEL
ALBUMIN: 4.3 g/dL (ref 3.5–5.2)
ALK PHOS: 75 U/L (ref 39–117)
ALT: 10 U/L (ref 0–35)
AST: 14 U/L (ref 0–37)
Bilirubin, Direct: 0.1 mg/dL (ref 0.0–0.3)
Total Bilirubin: 0.6 mg/dL (ref 0.2–1.2)
Total Protein: 7.1 g/dL (ref 6.0–8.3)

## 2016-05-12 LAB — LIPID PANEL
Cholesterol: 195 mg/dL (ref 0–200)
HDL: 60.2 mg/dL (ref >39.00)
LDL Cholesterol: 114 mg/dL — ABNORMAL HIGH (ref 0–99)
NONHDL: 135
Total CHOL/HDL Ratio: 3
Triglycerides: 107 mg/dL (ref 0.0–149.0)
VLDL: 21.4 mg/dL (ref 0.0–40.0)

## 2016-05-12 LAB — TSH: TSH: 3.23 u[IU]/mL (ref 0.35–4.50)

## 2016-05-19 NOTE — Progress Notes (Signed)
Chief Complaint  Patient presents with  . Annual Exam    HPI: Patient  Amanda Williams  51 y.o. comes in today for Preventive Health Care visit  Last seen 5 2014 reestablishing  Has been pretty well.   Is on Sertraline for panic disorder  And   And oral hrt   For menopausal.  sx .  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  Asks  If can transition over and have me prescribe her sertraline. She denies any specific major side effects and has been on it a while. Asks about Mr. shot for her hepatitis vaccine she got the AB #1 but never got the second and the third. We'll be getting her mammogram in May and scheduled for colonoscopy in March.  Health Maintenance  Topic Date Due  . MAMMOGRAM  07/29/2014  . COLONOSCOPY  04/27/2015  . INFLUENZA VACCINE  10/22/2015  . HIV Screening  05/20/2017 (Originally 04/26/1980)  . TETANUS/TDAP  03/27/2018  . PAP SMEAR  08/12/2018   Health Maintenance Review LIFESTYLE:  Exercise:   Aim 3 x per week walking yoga . Tobacco/ETS: no Alcohol:  Weekend  Sugar beverages: juice .  Sleep: about 7   Drug use: no HH of   3  Dog 2 gp bearded dragon .  Sons in college also  Work:   workin  In home   20 hours  .    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  GEN/ HEENT: No fever, significant weight changes sweats headaches vision problems hearing changes, CV/ PULM; No chest pain shortness of breath cough, syncope,edema  change in exercise tolerance. GI /GU: No adominal pain, vomiting, change in bowel habits. No blood in the stool. No significant GU symptoms. SKIN/HEME: ,no acute skin rashes suspicious lesions or bleeding. No lymphadenopathy, nodules, masses.  NEURO/ PSYCH:  No neurologic signs such as weakness numbness. No depression anxiety. IMM/ Allergy: No unusual infections.   Allergy .   REST of 12 system review negative except as per HPI   Past Medical History:  Diagnosis Date  . Allergy   . Hematuria    ? trigonitis  . Panic attack    echo 2009 normal    Past Surgical History:  Procedure Laterality Date  . BREAST BIOPSY     left  . KIDNEY STONE SURGERY  5 2004    History reviewed. No pertinent family history.  Social History   Social History  . Marital status: Married    Spouse name: N/A  . Number of children: N/A  . Years of education: N/A   Social History Main Topics  . Smoking status: Never Smoker  . Smokeless tobacco: Never Used  . Alcohol use Yes     Comment: ocassionally  . Drug use: No  . Sexual activity: Not Asked   Other Topics Concern  . None   Social History Narrative   hhof  4    Pets dog and cat.   Works 25- 69 .     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    Has smoke detector and wears seat belts.  No firearms. No excess sun exposure. Sees dentist  regularly .           Outpatient Medications Prior to Visit  Medication Sig Dispense Refill  . sertraline (ZOLOFT) 100 MG tablet Take 100 mg by mouth daily.     . ciprofloxacin (CIPRO) 500 MG tablet Take 1 tablet (500 mg total) by mouth 2 (two) times daily. If needed for travelers diarrhea. 6 tablet 0   No facility-administered medications prior to visit.      EXAM:  BP 98/60 (BP Location: Right Arm, Patient Position: Sitting, Cuff Size: Normal)   Pulse 67   Temp 98.2 F (36.8 C) (Oral)   Ht 5' 6" (1.676 m)   Wt 154 lb 4.8 oz (70 kg)   BMI 24.90 kg/m   Body mass index is 24.9 kg/m. Wt Readings from Last 3 Encounters:  05/20/16 154 lb 4.8 oz (70 kg)  08/04/12 148 lb (67.1 kg)  04/12/12 150 lb (68 kg)    Physical Exam: Vital signs reviewed ZOX:WRUE is a well-developed well-nourished alert cooperative    who appearsr stated age in no acute distress.  HEENT: normocephalic atraumatic , Eyes: PERRL EOM's full, conjunctiva clear, Nares:  paten,t no deformity discharge or tenderness., Ears: no deformity EAC's clear TMs with normal landmarks. Mouth: clear OP, no lesions, edema.  Moist mucous membranes. Dentition in adequate repair. NECK: supple without masses, thyromegaly or bruits. CHEST/PULM:  Clear to auscultation and percussion breath sounds equal no wheeze , rales or rhonchi. No chest wall deformities or tenderness. Breast: normal by inspection . No dimpling, discharge, masses, tenderness or discharge . CV: PMI is nondisplaced, S1 S2 no gallops, murmurs, rubs. Peripheral pulses are full without delay.No JVD .  ABDOMEN: Bowel sounds normal nontender  No guard or rebound, no hepato splenomegal no CVA tenderness.  No hernia. Extremtities:  No clubbing cyanosis or edema, no acute joint swelling or redness no focal atrophy NEURO:  Oriented x3, cranial nerves 3-12 appear to be intact, no obvious focal weakness,gait within normal limits no abnormal reflexes or asymmetrical SKIN: No acute rashes normal turgor, color, no bruising or petechiae. PSYCH: Oriented, good eye contact, no obvious depression anxiety, cognition and judgment appear normal. LN: no cervical axillary inguinal adenopathy  Lab Results  Component Value Date   WBC 8.2 05/12/2016   HGB 13.2 05/12/2016   HCT 38.8 05/12/2016   PLT 329.0 05/12/2016   GLUCOSE 95 05/12/2016   CHOL 195 05/12/2016   TRIG 107.0 05/12/2016   HDL 60.20 05/12/2016   LDLCALC 114 (H) 05/12/2016   ALT 10 05/12/2016   AST 14 05/12/2016   NA 137 05/12/2016   K 4.2 05/12/2016   CL 101 05/12/2016   CREATININE 0.74 05/12/2016   BUN 10 05/12/2016   CO2 28 05/12/2016   TSH 3.23 05/12/2016    BP Readings from Last 3 Encounters:  05/20/16 98/60  08/04/12 110/76  04/12/12 104/76    Lab results reviewed with patient   ASSESSMENT AND PLAN:  Discussed the following assessment and plan:  Visit for preventive health examination  Medication management  Need for hepatitis A and B  vaccination  Hormone replacement therapy (HRT) - per gyne  Panic disorder - Well controlled on sertraline okay to transition to PCP for prescription when due. Med management   Has been seeing dr Caprice Beaver now closing practice  Counseled healthy ls Patient Care Team: Burnis Medin, MD as PCP - General Delsa Bern, MD (Obstetrics and Gynecology) Patient Instructions   Continue lifestyle intervention healthy eating and exercise .  Healthy lifestyle includes : At least 150 minutes of exercise weeks  , weight at healthy levels, which is usually   BMI 19-25. Avoid trans fats and processed foods;  Increase fresh fruits and veges to 5 servings per day. And avoid sweet beverages including tea and juice. Mediterranean diet with olive oil and nuts have been noted to be heart and brain healthy . Avoid tobacco products . Limit  alcohol to  7 per week for women and 14 servings for men.  Get adequate sleep . Wear seat belts . Don't text and drive .   Contact us for  Rx for the sertraline when  You   You are funning out to transition prescriber.  Twin rix# 2   And twin rix #3 over 4 months after # 2    Health Maintenance, Female Adopting a healthy lifestyle and getting preventive care can go a long way to promote health and wellness. Talk with your health care provider about what schedule of regular examinations is right for you. This is a good chance for you to check in with your provider about disease prevention and staying healthy. In between checkups, there are plenty of things you can do on your own. Experts have done a lot of research about which lifestyle changes and preventive measures are most likely to keep you healthy. Ask your health care provider for more information. Weight and diet Eat a healthy diet  Be sure to include plenty of vegetables, fruits, low-fat dairy products, and lean protein.  Do not eat a lot of foods high in solid fats, added sugars, or salt.  Get regular  exercise. This is one of the most important things you can do for your health.  Most adults should exercise for at least 150 minutes each week. The exercise should increase your heart rate and make you sweat (moderate-intensity exercise).  Most adults should also do strengthening exercises at least twice a week. This is in addition to the moderate-intensity exercise. Maintain a healthy weight  Body mass index (BMI) is a measurement that can be used to identify possible weight problems. It estimates body fat based on height and weight. Your health care provider can help determine your BMI and help you achieve or maintain a healthy weight.  For females 35 years of age and older:  A BMI below 18.5 is considered underweight.  A BMI of 18.5 to 24.9 is normal.  A BMI of 25 to 29.9 is considered overweight.  A BMI of 30 and above is considered obese. Watch levels of cholesterol and blood lipids  You should start having your blood tested for lipids and cholesterol at 51 years of age, then have this test every 5 years.  You may need to have your cholesterol levels checked more often if:  Your lipid or cholesterol levels are high.  You are older than 51 years of age.  You are at high risk for heart disease. Cancer screening Lung Cancer  Lung cancer screening is recommended for adults 43-18 years old who are at high risk for lung cancer because of a history of smoking.  A yearly low-dose CT scan of the lungs is recommended for people who:  Currently smoke.  Have quit within the past 15 years.  Have at least a 30-pack-year history of smoking. A pack year is smoking an average of one pack of cigarettes a day for 1 year.  Yearly screening should continue until it has been 15 years since you quit.  Yearly screening should stop if you develop a health problem that would prevent you from having lung cancer treatment. Breast Cancer  Practice breast self-awareness. This means  understanding how your breasts normally appear and feel.  It also means doing regular breast self-exams. Let your health care provider know about any changes, no matter how small.  If you are in your 20s or 30s, you should have a clinical breast exam (CBE) by a health care provider every 1-3 years as part of a regular health exam.  If you are 61 or older, have a CBE every year. Also consider having a breast X-ray (mammogram) every year.  If you have a family history of breast cancer, talk to your health care provider about genetic screening.  If you are at high risk for breast cancer, talk to your health care provider about having an MRI and a mammogram every year.  Breast cancer gene (BRCA) assessment is recommended for women who have family members with BRCA-related cancers. BRCA-related cancers include:  Breast.  Ovarian.  Tubal.  Peritoneal cancers.  Results of the assessment will determine the need for genetic counseling and BRCA1 and BRCA2 testing. Cervical Cancer  Your health care provider may recommend that you be screened regularly for cancer of the pelvic organs (ovaries, uterus, and vagina). This screening involves a pelvic examination, including checking for microscopic changes to the surface of your cervix (Pap test). You may be encouraged to have this screening done every 3 years, beginning at age 27.  For women ages 37-65, health care providers may recommend pelvic exams and Pap testing every 3 years, or they may recommend the Pap and pelvic exam, combined with testing for human papilloma virus (HPV), every 5 years. Some types of HPV increase your risk of cervical cancer. Testing for HPV may also be done on women of any age with unclear Pap test results.  Other health care providers may not recommend any screening for nonpregnant women who are considered low risk for pelvic cancer and who do not have symptoms. Ask your health care provider if a screening pelvic exam is  right for you.  If you have had past treatment for cervical cancer or a condition that could lead to cancer, you need Pap tests and screening for cancer for at least 20 years after your treatment. If Pap tests have been discontinued, your risk factors (such as having a new sexual partner) need to be reassessed to determine if screening should resume. Some women have medical problems that increase the chance of getting cervical cancer. In these cases, your health care provider may recommend more frequent screening and Pap tests. Colorectal Cancer  This type of cancer can be detected and often prevented.  Routine colorectal cancer screening usually begins at 51 years of age and continues through 51 years of age.  Your health care provider may recommend screening at an earlier age if you have risk factors for colon cancer.  Your health care provider may also recommend using home test kits to check for hidden blood in the stool.  A small camera at the end of a tube can be used to examine your colon directly (sigmoidoscopy or colonoscopy). This is done to check for the earliest forms of colorectal cancer.  Routine screening usually begins at age 74.  Direct examination of the colon should be repeated every 5-10 years through 51 years of age. However, you may need to be screened more often if early forms of precancerous polyps or  small growths are found. Skin Cancer  Check your skin from head to toe regularly.  Tell your health care provider about any new moles or changes in moles, especially if there is a change in a mole's shape or color.  Also tell your health care provider if you have a mole that is larger than the size of a pencil eraser.  Always use sunscreen. Apply sunscreen liberally and repeatedly throughout the day.  Protect yourself by wearing long sleeves, pants, a wide-brimmed hat, and sunglasses whenever you are outside. Heart disease, diabetes, and high blood pressure  High  blood pressure causes heart disease and increases the risk of stroke. High blood pressure is more likely to develop in:  People who have blood pressure in the high end of the normal range (130-139/85-89 mm Hg).  People who are overweight or obese.  People who are African American.  If you are 68-80 years of age, have your blood pressure checked every 3-5 years. If you are 51 years of age or older, have your blood pressure checked every year. You should have your blood pressure measured twice-once when you are at a hospital or clinic, and once when you are not at a hospital or clinic. Record the average of the two measurements. To check your blood pressure when you are not at a hospital or clinic, you can use:  An automated blood pressure machine at a pharmacy.  A home blood pressure monitor.  If you are between 71 years and 45 years old, ask your health care provider if you should take aspirin to prevent strokes.  Have regular diabetes screenings. This involves taking a blood sample to check your fasting blood sugar level.  If you are at a normal weight and have a low risk for diabetes, have this test once every three years after 51 years of age.  If you are overweight and have a high risk for diabetes, consider being tested at a younger age or more often. Preventing infection Hepatitis B  If you have a higher risk for hepatitis B, you should be screened for this virus. You are considered at high risk for hepatitis B if:  You were born in a country where hepatitis B is common. Ask your health care provider which countries are considered high risk.  Your parents were born in a high-risk country, and you have not been immunized against hepatitis B (hepatitis B vaccine).  You have HIV or AIDS.  You use needles to inject street drugs.  You live with someone who has hepatitis B.  You have had sex with someone who has hepatitis B.  You get hemodialysis treatment.  You take certain  medicines for conditions, including cancer, organ transplantation, and autoimmune conditions. Hepatitis C  Blood testing is recommended for:  Everyone born from [birth year] through 1965.  Anyone with known risk factors for hepatitis C. Sexually transmitted infections (STIs)  You should be screened for sexually transmitted infections (STIs) including gonorrhea and chlamydia if:  You are sexually active and are younger than 50 years of age.  You are older than 51 years of age and your health care provider tells you that you are at risk for this type of infection.  Your sexual activity has changed since you were last screened and you are at an increased risk for chlamydia or gonorrhea. Ask your health care provider if you are at risk.  If you do not have HIV, but are at risk, it may be recommended that  you take a prescription medicine daily to prevent HIV infection. This is called pre-exposure prophylaxis (PrEP). You are considered at risk if:  You are sexually active and do not regularly use condoms or know the HIV status of your partner(s).  You take drugs by injection.  You are sexually active with a partner who has HIV. Talk with your health care provider about whether you are at high risk of being infected with HIV. If you choose to begin PrEP, you should first be tested for HIV. You should then be tested every 3 months for as long as you are taking PrEP. Pregnancy  If you are premenopausal and you may become pregnant, ask your health care provider about preconception counseling.  If you may become pregnant, take 400 to 800 micrograms (mcg) of folic acid every day.  If you want to prevent pregnancy, talk to your health care provider about birth control (contraception). Osteoporosis and menopause  Osteoporosis is a disease in which the bones lose minerals and strength with aging. This can result in serious bone fractures. Your risk for osteoporosis can be identified using a bone density  scan.  If you are 3 years of age or older, or if you are at risk for osteoporosis and fractures, ask your health care provider if you should be screened.  Ask your health care provider whether you should take a calcium or vitamin D supplement to lower your risk for osteoporosis.  Menopause may have certain physical symptoms and risks.  Hormone replacement therapy may reduce some of these symptoms and risks. Talk to your health care provider about whether hormone replacement therapy is right for you. Follow these instructions at home:  Schedule regular health, dental, and eye exams.  Stay current with your immunizations.  Do not use any tobacco products including cigarettes, chewing tobacco, or electronic cigarettes.  If you are pregnant, do not drink alcohol.  If you are breastfeeding, limit how much and how often you drink alcohol.  Limit alcohol intake to no more than 1 drink per day for nonpregnant women. One drink equals 12 ounces of beer, 5 ounces of wine, or 1 ounces of hard liquor.  Do not use street drugs.  Do not share needles.  Ask your health care provider for help if you need support or information about quitting drugs.  Tell your health care provider if you often feel depressed.  Tell your health care provider if you have ever been abused or do not feel safe at home. This information is not intended to replace advice given to you by your health care provider. Make sure you discuss any questions you have with your health care provider. Document Released: 09/22/2010 Document Revised: 08/15/2015 Document Reviewed: 12/11/2014 Elsevier Interactive Patient Education  2017 [place] K. Panosh M.D.

## 2016-05-20 ENCOUNTER — Encounter: Payer: Self-pay | Admitting: Internal Medicine

## 2016-05-20 ENCOUNTER — Ambulatory Visit (INDEPENDENT_AMBULATORY_CARE_PROVIDER_SITE_OTHER): Payer: 59 | Admitting: Internal Medicine

## 2016-05-20 VITALS — BP 98/60 | HR 67 | Temp 98.2°F | Ht 66.0 in | Wt 154.3 lb

## 2016-05-20 DIAGNOSIS — Z0000 Encounter for general adult medical examination without abnormal findings: Secondary | ICD-10-CM | POA: Diagnosis not present

## 2016-05-20 DIAGNOSIS — Z79890 Hormone replacement therapy (postmenopausal): Secondary | ICD-10-CM | POA: Diagnosis not present

## 2016-05-20 DIAGNOSIS — Z79899 Other long term (current) drug therapy: Secondary | ICD-10-CM

## 2016-05-20 DIAGNOSIS — Z23 Encounter for immunization: Secondary | ICD-10-CM

## 2016-05-20 DIAGNOSIS — F410 Panic disorder [episodic paroxysmal anxiety] without agoraphobia: Secondary | ICD-10-CM | POA: Diagnosis not present

## 2016-05-20 NOTE — Patient Instructions (Addendum)
Continue lifestyle intervention healthy eating and exercise . Healthy lifestyle includes : At least 150 minutes of exercise weeks  , weight at healthy levels, which is usually   BMI 19-25. Avoid trans fats and processed foods;  Increase fresh fruits and veges to 5 servings per day. And avoid sweet beverages including tea and juice. Mediterranean diet with olive oil and nuts have been noted to be heart and brain healthy . Avoid tobacco products . Limit  alcohol to  7 per week for women and 14 servings for men.  Get adequate sleep . Wear seat belts . Don't text and drive .   Contact us for  Rx for the sertraline when  You   You are funning out to transition prescriber.  Twin rix# 2   And twin rix #3 over 4 months after # 2    Health Maintenance, Female Adopting a healthy lifestyle and getting preventive care can go a long way to promote health and wellness. Talk with your health care provider about what schedule of regular examinations is right for you. This is a good chance for you to check in with your provider about disease prevention and staying healthy. In between checkups, there are plenty of things you can do on your own. Experts have done a lot of research about which lifestyle changes and preventive measures are most likely to keep you healthy. Ask your health care provider for more information. Weight and diet Eat a healthy diet  Be sure to include plenty of vegetables, fruits, low-fat dairy products, and lean protein.  Do not eat a lot of foods high in solid fats, added sugars, or salt.  Get regular exercise. This is one of the most important things you can do for your health.  Most adults should exercise for at least 150 minutes each week. The exercise should increase your heart rate and make you sweat (moderate-intensity exercise).  Most adults should also do strengthening exercises at least twice a week. This is in addition to the moderate-intensity exercise. Maintain a  healthy weight  Body mass index (BMI) is a measurement that can be used to identify possible weight problems. It estimates body fat based on height and weight. Your health care provider can help determine your BMI and help you achieve or maintain a healthy weight.  For females 31 years of age and older:  A BMI below 18.5 is considered underweight.  A BMI of 18.5 to 24.9 is normal.  A BMI of 25 to 29.9 is considered overweight.  A BMI of 30 and above is considered obese. Watch levels of cholesterol and blood lipids  You should start having your blood tested for lipids and cholesterol at 51 years of age, then have this test every 5 years.  You may need to have your cholesterol levels checked more often if:  Your lipid or cholesterol levels are high.  You are older than 51 years of age.  You are at high risk for heart disease. Cancer screening Lung Cancer  Lung cancer screening is recommended for adults 80-76 years old who are at high risk for lung cancer because of a history of smoking.  A yearly low-dose CT scan of the lungs is recommended for people who:  Currently smoke.  Have quit within the past 15 years.  Have at least a 30-pack-year history of smoking. A pack year is smoking an average of one pack of cigarettes a day for 1 year.  Yearly screening should continue until  it has been 15 years since you quit.  Yearly screening should stop if you develop a health problem that would prevent you from having lung cancer treatment. Breast Cancer  Practice breast self-awareness. This means understanding how your breasts normally appear and feel.  It also means doing regular breast self-exams. Let your health care provider know about any changes, no matter how small.  If you are in your 20s or 30s, you should have a clinical breast exam (CBE) by a health care provider every 1-3 years as part of a regular health exam.  If you are 38 or older, have a CBE every year. Also  consider having a breast X-ray (mammogram) every year.  If you have a family history of breast cancer, talk to your health care provider about genetic screening.  If you are at high risk for breast cancer, talk to your health care provider about having an MRI and a mammogram every year.  Breast cancer gene (BRCA) assessment is recommended for women who have family members with BRCA-related cancers. BRCA-related cancers include:  Breast.  Ovarian.  Tubal.  Peritoneal cancers.  Results of the assessment will determine the need for genetic counseling and BRCA1 and BRCA2 testing. Cervical Cancer  Your health care provider may recommend that you be screened regularly for cancer of the pelvic organs (ovaries, uterus, and vagina). This screening involves a pelvic examination, including checking for microscopic changes to the surface of your cervix (Pap test). You may be encouraged to have this screening done every 3 years, beginning at age 47.  For women ages 21-65, health care providers may recommend pelvic exams and Pap testing every 3 years, or they may recommend the Pap and pelvic exam, combined with testing for human papilloma virus (HPV), every 5 years. Some types of HPV increase your risk of cervical cancer. Testing for HPV may also be done on women of any age with unclear Pap test results.  Other health care providers may not recommend any screening for nonpregnant women who are considered low risk for pelvic cancer and who do not have symptoms. Ask your health care provider if a screening pelvic exam is right for you.  If you have had past treatment for cervical cancer or a condition that could lead to cancer, you need Pap tests and screening for cancer for at least 20 years after your treatment. If Pap tests have been discontinued, your risk factors (such as having a new sexual partner) need to be reassessed to determine if screening should resume. Some women have medical problems that  increase the chance of getting cervical cancer. In these cases, your health care provider may recommend more frequent screening and Pap tests. Colorectal Cancer  This type of cancer can be detected and often prevented.  Routine colorectal cancer screening usually begins at 51 years of age and continues through 51 years of age.  Your health care provider may recommend screening at an earlier age if you have risk factors for colon cancer.  Your health care provider may also recommend using home test kits to check for hidden blood in the stool.  A small camera at the end of a tube can be used to examine your colon directly (sigmoidoscopy or colonoscopy). This is done to check for the earliest forms of colorectal cancer.  Routine screening usually begins at age 69.  Direct examination of the colon should be repeated every 5-10 years through 51 years of age. However, you may need to be screened  more often if early forms of precancerous polyps or small growths are found. Skin Cancer  Check your skin from head to toe regularly.  Tell your health care provider about any new moles or changes in moles, especially if there is a change in a mole's shape or color.  Also tell your health care provider if you have a mole that is larger than the size of a pencil eraser.  Always use sunscreen. Apply sunscreen liberally and repeatedly throughout the day.  Protect yourself by wearing long sleeves, pants, a wide-brimmed hat, and sunglasses whenever you are outside. Heart disease, diabetes, and high blood pressure  High blood pressure causes heart disease and increases the risk of stroke. High blood pressure is more likely to develop in:  People who have blood pressure in the high end of the normal range (130-139/85-89 mm Hg).  People who are overweight or obese.  People who are African American.  If you are 37-29 years of age, have your blood pressure checked every 3-5 years. If you are 60 years of  age or older, have your blood pressure checked every year. You should have your blood pressure measured twice-once when you are at a hospital or clinic, and once when you are not at a hospital or clinic. Record the average of the two measurements. To check your blood pressure when you are not at a hospital or clinic, you can use:  An automated blood pressure machine at a pharmacy.  A home blood pressure monitor.  If you are between 32 years and 79 years old, ask your health care provider if you should take aspirin to prevent strokes.  Have regular diabetes screenings. This involves taking a blood sample to check your fasting blood sugar level.  If you are at a normal weight and have a low risk for diabetes, have this test once every three years after 51 years of age.  If you are overweight and have a high risk for diabetes, consider being tested at a younger age or more often. Preventing infection Hepatitis B  If you have a higher risk for hepatitis B, you should be screened for this virus. You are considered at high risk for hepatitis B if:  You were born in a country where hepatitis B is common. Ask your health care provider which countries are considered high risk.  Your parents were born in a high-risk country, and you have not been immunized against hepatitis B (hepatitis B vaccine).  You have HIV or AIDS.  You use needles to inject street drugs.  You live with someone who has hepatitis B.  You have had sex with someone who has hepatitis B.  You get hemodialysis treatment.  You take certain medicines for conditions, including cancer, organ transplantation, and autoimmune conditions. Hepatitis C  Blood testing is recommended for:  Everyone born from [birth year] through 1965.  Anyone with known risk factors for hepatitis C. Sexually transmitted infections (STIs)  You should be screened for sexually transmitted infections (STIs) including gonorrhea and chlamydia if:  You are  sexually active and are younger than 51 years of age.  You are older than 51 years of age and your health care provider tells you that you are at risk for this type of infection.  Your sexual activity has changed since you were last screened and you are at an increased risk for chlamydia or gonorrhea. Ask your health care provider if you are at risk.  If you do not have HIV,  but are at risk, it may be recommended that you take a prescription medicine daily to prevent HIV infection. This is called pre-exposure prophylaxis (PrEP). You are considered at risk if:  You are sexually active and do not regularly use condoms or know the HIV status of your partner(s).  You take drugs by injection.  You are sexually active with a partner who has HIV. Talk with your health care provider about whether you are at high risk of being infected with HIV. If you choose to begin PrEP, you should first be tested for HIV. You should then be tested every 3 months for as long as you are taking PrEP. Pregnancy  If you are premenopausal and you may become pregnant, ask your health care provider about preconception counseling.  If you may become pregnant, take 400 to 800 micrograms (mcg) of folic acid every day.  If you want to prevent pregnancy, talk to your health care provider about birth control (contraception). Osteoporosis and menopause  Osteoporosis is a disease in which the bones lose minerals and strength with aging. This can result in serious bone fractures. Your risk for osteoporosis can be identified using a bone density scan.  If you are 86 years of age or older, or if you are at risk for osteoporosis and fractures, ask your health care provider if you should be screened.  Ask your health care provider whether you should take a calcium or vitamin D supplement to lower your risk for osteoporosis.  Menopause may have certain physical symptoms and risks.  Hormone replacement therapy may reduce some of  these symptoms and risks. Talk to your health care provider about whether hormone replacement therapy is right for you. Follow these instructions at home:  Schedule regular health, dental, and eye exams.  Stay current with your immunizations.  Do not use any tobacco products including cigarettes, chewing tobacco, or electronic cigarettes.  If you are pregnant, do not drink alcohol.  If you are breastfeeding, limit how much and how often you drink alcohol.  Limit alcohol intake to no more than 1 drink per day for nonpregnant women. One drink equals 12 ounces of beer, 5 ounces of wine, or 1 ounces of hard liquor.  Do not use street drugs.  Do not share needles.  Ask your health care provider for help if you need support or information about quitting drugs.  Tell your health care provider if you often feel depressed.  Tell your health care provider if you have ever been abused or do not feel safe at home. This information is not intended to replace advice given to you by your health care provider. Make sure you discuss any questions you have with your health care provider. Document Released: 09/22/2010 Document Revised: 08/15/2015 Document Reviewed: 12/11/2014 Elsevier Interactive Patient Education  2017 [employer].

## 2016-12-14 ENCOUNTER — Other Ambulatory Visit: Payer: Self-pay | Admitting: Radiology

## 2016-12-16 ENCOUNTER — Telehealth: Payer: Self-pay | Admitting: Hematology and Oncology

## 2016-12-16 ENCOUNTER — Other Ambulatory Visit: Payer: Self-pay | Admitting: Radiology

## 2016-12-16 ENCOUNTER — Encounter: Payer: Self-pay | Admitting: Hematology and Oncology

## 2016-12-16 DIAGNOSIS — Z1501 Genetic susceptibility to malignant neoplasm of breast: Secondary | ICD-10-CM

## 2016-12-16 DIAGNOSIS — Z853 Personal history of malignant neoplasm of breast: Secondary | ICD-10-CM

## 2016-12-16 DIAGNOSIS — Z1509 Genetic susceptibility to other malignant neoplasm: Secondary | ICD-10-CM

## 2016-12-16 NOTE — Telephone Encounter (Signed)
LVM for patient to return call in reference to [facility] appointment for the afternoon.

## 2016-12-17 ENCOUNTER — Ambulatory Visit
Admission: RE | Admit: 2016-12-17 | Discharge: 2016-12-17 | Disposition: A | Discharge status: Home / Self Care | Payer: 59 | Source: Ambulatory Visit | Attending: Radiology | Admitting: Radiology

## 2016-12-17 DIAGNOSIS — Z1509 Genetic susceptibility to other malignant neoplasm: Secondary | ICD-10-CM

## 2016-12-17 DIAGNOSIS — Z853 Personal history of malignant neoplasm of breast: Secondary | ICD-10-CM

## 2016-12-17 DIAGNOSIS — Z1501 Genetic susceptibility to malignant neoplasm of breast: Secondary | ICD-10-CM

## 2016-12-17 MED ORDER — GADOBENATE DIMEGLUMINE 529 MG/ML IV SOLN: 15.0000 mL | Freq: Once | INTRAVENOUS | Status: DC | PRN

## 2016-12-18 ENCOUNTER — Encounter: Payer: Self-pay | Admitting: *Deleted

## 2016-12-18 DIAGNOSIS — C50811 Malignant neoplasm of overlapping sites of right female breast: Secondary | ICD-10-CM

## 2016-12-18 DIAGNOSIS — Z170 Estrogen receptor positive status [ER+]: Secondary | ICD-10-CM

## 2016-12-18 DIAGNOSIS — C50812 Malignant neoplasm of overlapping 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 breast: Secondary | ICD-10-CM | POA: Insufficient documentation

## 2016-12-21 ENCOUNTER — Other Ambulatory Visit: Payer: Self-pay | Admitting: Radiology

## 2016-12-23 ENCOUNTER — Ambulatory Visit: Payer: 59 | Attending: General Surgery | Admitting: Physical Therapy

## 2016-12-23 ENCOUNTER — Ambulatory Visit (HOSPITAL_BASED_OUTPATIENT_CLINIC_OR_DEPARTMENT_OTHER): Payer: 59 | Admitting: Hematology and Oncology

## 2016-12-23 ENCOUNTER — Other Ambulatory Visit: Payer: Self-pay | Admitting: *Deleted

## 2016-12-23 ENCOUNTER — Telehealth: Payer: Self-pay

## 2016-12-23 ENCOUNTER — Other Ambulatory Visit (HOSPITAL_BASED_OUTPATIENT_CLINIC_OR_DEPARTMENT_OTHER): Payer: 59

## 2016-12-23 ENCOUNTER — Ambulatory Visit: Payer: Self-pay | Admitting: General Surgery

## 2016-12-23 ENCOUNTER — Encounter: Payer: Self-pay | Admitting: Hematology and Oncology

## 2016-12-23 ENCOUNTER — Encounter: Payer: Self-pay | Admitting: Physical Therapy

## 2016-12-23 ENCOUNTER — Ambulatory Visit
Admission: RE | Admit: 2016-12-23 | Discharge: 2016-12-23 | Disposition: A | Discharge status: Home / Self Care | Payer: 59 | Source: Ambulatory Visit | Attending: Radiation Oncology | Admitting: Radiation Oncology

## 2016-12-23 ENCOUNTER — Encounter: Payer: Self-pay | Admitting: Radiation Oncology

## 2016-12-23 DIAGNOSIS — C50811 Malignant neoplasm of overlapping sites of right female breast: Secondary | ICD-10-CM

## 2016-12-23 DIAGNOSIS — R293 Abnormal posture: Secondary | ICD-10-CM | POA: Diagnosis present

## 2016-12-23 DIAGNOSIS — C50812 Malignant neoplasm of overlapping 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 breast: Secondary | ICD-10-CM

## 2016-12-23 DIAGNOSIS — Z170 Estrogen receptor positive status [ER+]: Secondary | ICD-10-CM

## 2016-12-23 DIAGNOSIS — C50912 Malignant neoplasm of unspecified site of left female breast: Secondary | ICD-10-CM | POA: Diagnosis not present

## 2016-12-23 DIAGNOSIS — C773 Secondary and unspecified malignant neoplasm of axilla and upper limb lymph nodes: Secondary | ICD-10-CM | POA: Diagnosis not present

## 2016-12-23 LAB — CBC WITH DIFFERENTIAL/PLATELET
BASO%: 0.8 % (ref 0.0–2.0)
BASOS ABS: 0.1 10*3/uL (ref 0.0–0.1)
EOS%: 0.6 % (ref 0.0–7.0)
Eosinophils Absolute: 0 10*3/uL (ref 0.0–0.5)
HCT: 38.9 % (ref 34.8–46.6)
HGB: 13 g/dL (ref 11.6–15.9)
LYMPH%: 31.2 % (ref 14.0–49.7)
MCH: 30 pg (ref 25.1–34.0)
MCHC: 33.5 g/dL (ref 31.5–36.0)
MCV: 89.6 fL (ref 79.5–101.0)
MONO#: 0.4 10*3/uL (ref 0.1–0.9)
MONO%: 5.4 % (ref 0.0–14.0)
NEUT#: 4.5 10*3/uL (ref 1.5–6.5)
NEUT%: 62 % (ref 38.4–76.8)
Platelets: 346 10*3/uL (ref 145–400)
RBC: 4.34 10*6/uL (ref 3.70–5.45)
RDW: 12.7 % (ref 11.2–14.5)
WBC: 7.2 10*3/uL (ref 3.9–10.3)
lymph#: 2.3 10*3/uL (ref 0.9–3.3)

## 2016-12-23 LAB — COMPREHENSIVE METABOLIC PANEL
ALT: 11 U/L (ref 0–55)
ANION GAP: 9 meq/L (ref 3–11)
AST: 16 U/L (ref 5–34)
Albumin: 4.1 g/dL (ref 3.5–5.0)
Alkaline Phosphatase: 79 U/L (ref 40–150)
BUN: 8.7 mg/dL (ref 7.0–26.0)
CHLORIDE: 105 meq/L (ref 98–109)
CO2: 26 meq/L (ref 22–29)
Calcium: 9.7 mg/dL (ref 8.4–10.4)
Creatinine: 0.8 mg/dL (ref 0.6–1.1)
EGFR: 84 mL/min/{1.73_m2} — ABNORMAL LOW (ref >90)
Glucose: 113 mg/dl (ref 70–140)
POTASSIUM: 4 meq/L (ref 3.5–5.1)
Sodium: 140 mEq/L (ref 136–145)
Total Bilirubin: 0.5 mg/dL (ref 0.20–1.20)
Total Protein: 7.9 g/dL (ref 6.4–8.3)

## 2016-12-23 MED ORDER — TAMOXIFEN CITRATE 20 MG PO TABS: 20.0000 mg | ORAL_TABLET | Freq: Every day | ORAL | 0 refills | Status: DC

## 2016-12-23 NOTE — Progress Notes (Signed)
Nutrition Assessment  Reason for Assessment:  Pt seen in Breast Clinic  ASSESSMENT:   51 year old female with new diagnosis of breast cancer.  Past medical history of panic disorder, menopause symptoms  Patient reports normal appetite  Medications:  reviewed  Labs: reviewed  Anthropometrics:   Height: 66 inches Weight: 150 lb BMI: 24   NUTRITION DIAGNOSIS: Food and nutrition related knowledge deficit related to new diagnosis of breast cancer as evidenced by no prior need for nutrition related information.  INTERVENTION:   Discussed and provided packet of information regarding nutritional tips for breast cancer patients.  Questions answered.  Teachback method used.  Contact information provided and patient knows to contact me with questions/concerns.    MONITORING, EVALUATION, and GOAL: Pt will consume a healthy plant based diet to maintain lean body mass throughout treatment.   Amanda Williams B. Zenia Resides, [place], [place] Registered Dietitian [phone] (pager)

## 2016-12-23 NOTE — Patient Instructions (Signed)

## 2016-12-23 NOTE — Progress Notes (Signed)
Radiation Oncology         [phone] ________________________________  Name: DARIN REDMANN        MRN: [id number]  Date of Service: 12/23/2016 DOB: [birth year]  TO:IZTIWP, Standley Brooking, MD  Excell Seltzer, MD     REFERRING PHYSICIAN: Excell Seltzer, MD   DIAGNOSIS: The encounter diagnosis was Malignant neoplasm of overlapping sites of right breast in female, estrogen receptor positive ([place]).   HISTORY OF PRESENT ILLNESS: ANSLEE MICHELETTI is a 51 y.o. female seen in the multidisciplinary breast clinic for a new diagnosis of bilateral breast cancer. The patient was noted to have a palpable mass with nipple inversion of the right breast for several weeks. She proceeded with mammography on 12/10/16 which revealed dense tissue and concerns for distortion in the posterior aspect of the breast with displacement and thickening of the skin. She had an ultrasound of the right breast as well that revealed a 3.1 cm mass at 11:00, 2.5 cm mass at 6:00, 4 cm mass at 8:00, and a 2.2 cm mass at 5:00. In the axilla 2 of the lymph nodes had cortical thickening. She proceeded with biopsy of the right breast in three sites on 12/14/16, the 11:00, 5:30, and 8:00 positions were listed as biopsy sites and revealed a grade 1, ER/PR positive, HER2 negative invasive lobular carcinoma in each. The axillary node was also notable for carcinoma. She had an MRI on 12/17/16 and this revealed enhancement of the skin, with patchy nodular enhancement of bilateral breasts. Within the right breast, there was diffuse enhancement in all 4 quadrants with about 7 cm of enhancement in total per discussion by radiology in conference. On the left there were three spiculated masses, all measuring about 1 cm and throughout the 4 quadrants. She underwent left breast ultrasound that revealed 3.6 cm at 9:00 and 1 cm at 12:00, and her axilla was negative. A biopsy of the two sites seen on ultrasound on 12/21/16 revealed grade 1 invasive ductal carcinoma.  She comes today to discuss options of treatment for her cancer.   PREVIOUS RADIATION THERAPY: No   PAST MEDICAL HISTORY:  Past Medical History:  Diagnosis Date  . Allergy   . Hematuria    ? trigonitis  . Panic attack    echo 2009 normal       PAST SURGICAL HISTORY: Past Surgical History:  Procedure Laterality Date  . BREAST BIOPSY     left  . KIDNEY STONE SURGERY  5 2004     FAMILY HISTORY: Family History  Problem Relation Age of Onset  . Breast cancer Maternal Grandmother     SOCIAL HISTORY:  reports that she has never smoked. She has never used smokeless tobacco. She reports that she drinks about 2.4 oz of alcohol per week . She reports that she does not use drugs. The patient is a Engineer, maintenance (IT). She is married and lives in [place].    ALLERGIES: Penicillins and Sulfonamide derivatives   MEDICATIONS:  Current Outpatient Prescriptions  Medication Sig Dispense Refill  . estradiol (ESTRACE) 1 MG tablet Take 1 mg by mouth daily.    . Progesterone Micronized (PROGESTERONE PO) Take by mouth.    . sertraline (ZOLOFT) 100 MG tablet Take 100 mg by mouth daily.      No current facility-administered medications for this encounter.      REVIEW OF SYSTEMS: On review of systems, the patient reports that she is doing well overall. She denies any chest pain, shortness of breath, cough,  fevers, chills, night sweats, unintended weight changes. She denies any bowel or bladder disturbances, and denies abdominal pain, nausea or vomiting. She denies any new musculoskeletal or joint aches or pains. A complete review of systems is obtained and is otherwise negative.     PHYSICAL EXAM:  Wt Readings from Last 3 Encounters:  12/23/16 150 lb 4.8 oz (68.2 kg)  05/20/16 154 lb 4.8 oz (70 kg)  08/04/12 148 lb (67.1 kg)   Temp Readings from Last 3 Encounters:  12/23/16 98.5 F (36.9 C) (Oral)  05/20/16 98.2 F (36.8 C) (Oral)  08/04/12 98.1 F (36.7 C) (Oral)   BP Readings from Last 3  Encounters:  12/23/16 118/75  05/20/16 98/60  08/04/12 110/76   Pulse Readings from Last 3 Encounters:  12/23/16 68  05/20/16 67  08/04/12 (!) 59     In general this is a well appearing Caucasian female in no acute distress. She is alert and oriented x4 and appropriate throughout the examination. HEENT reveals that the patient is normocephalic, atraumatic. EOMs are intact. PERRLA. Skin is intact without any evidence of gross lesions. Cardiopulmonary assessment is negative for acute distress and she exhibits normal effort. Breast exam is deferred.    ECOG = 1  0 - Asymptomatic (Fully active, able to carry on all predisease activities without restriction)  1 - Symptomatic but completely ambulatory (Restricted in physically strenuous activity but ambulatory and able to carry out work of a light or sedentary nature. For example, light housework, office work)  2 - Symptomatic, <50% in bed during the day (Ambulatory and capable of all self care but unable to carry out any work activities. Up and about more than 50% of waking hours)  3 - Symptomatic, >50% in bed, but not bedbound (Capable of only limited self-care, confined to bed or chair 50% or more of waking hours)  4 - Bedbound (Completely disabled. Cannot carry on any self-care. Totally confined to bed or chair)  5 - Death   Eustace Pen MM, Creech RH, Tormey DC, et al. [phone]). "Toxicity and response criteria of the [facility]". Bee Oncol. 5 (6): 649-55    LABORATORY DATA:  Lab Results  Component Value Date   WBC 7.2 12/23/2016   HGB 13.0 12/23/2016   HCT 38.9 12/23/2016   MCV 89.6 12/23/2016   PLT 346 12/23/2016   Lab Results  Component Value Date   NA 140 12/23/2016   K 4.0 12/23/2016   CL 101 05/12/2016   CO2 26 12/23/2016   Lab Results  Component Value Date   ALT 11 12/23/2016   AST 16 12/23/2016   ALKPHOS 79 12/23/2016   BILITOT 0.50 12/23/2016      RADIOGRAPHY: Mr Breast  Bilateral W Wo Contrast  Result Date: 12/18/2016 CLINICAL DATA:  Recent diagnosis right breast cancer. LABS:  Does not apply EXAM: BILATERAL BREAST MRI WITH AND WITHOUT CONTRAST TECHNIQUE: Multiplanar, multisequence MR images of both breasts were obtained prior to and following the intravenous administration of 15 ml of MultiHance. THREE-DIMENSIONAL MR IMAGE RENDERING ON INDEPENDENT WORKSTATION: Three-dimensional MR images were rendered by post-processing of the original MR data on an independent workstation. The three-dimensional MR images were interpreted, and findings are reported in the following complete MRI report for this study. Three dimensional images were evaluated at the independent DynaCad workstation COMPARISON:  Previous exam(s). FINDINGS: Breast composition: d. Extreme fibroglandular tissue. Background parenchymal enhancement: Marked. Right breast: Extensive enhancement with plateau enhancement kinetics is identified throughout  the right breast involving all 4 quadrants with extension of enhancement to the skin and thick in the enhancement of the skin especially just below the nipple. The abnormal enhancement does not extend to the chest wall. Left breast: Patchy nodular enhancement is noted throughout all 4 quadrants of the left breast with similar enhancement pattern to the right breast but less extent of involvement. There are 3 spiculated masse, one in the medial mid depth left breast and two in the anterior and mid depth left breast at the level of the nipple. All these masses measure approximately 1 cm in size. Lymph nodes: Abnormal thick cortex enlarged lymph nodes are identified within the right axilla consistent with biopsy proven metastatic breast cancer. The left axillary lymph nodes are normal appearing. Ancillary findings:  None. IMPRESSION: Suspicious findings. RECOMMENDATION: Extensive enhancement throughout the right breast 4 quadrant with extension to the skin and no extension to  the chest wall consistent with biopsy proven breast cancer. Abnormal enhancing enlarged right axillary lymph nodes consistent with biopsy proven metastatic breast cancer. Patchy nodular enhancement throughout all 4 quadrants of the left breast with 3 spiculated masses in the anterior to mid and depth left breast. Recommend second-look ultrasound for the 3 spiculated masses with biopsy. BI-RADS CATEGORY  5: Highly suggestive of malignancy. Electronically Signed   By: Abelardo Diesel M.D.   On: 12/18/2016 08:45       IMPRESSION/PLAN: 1. 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 The patient's prognostic markers are still pending at the time of dictation. Dr. Lisbeth Renshaw discusses the pathology findings and reviews the nature of bilateral breast disease. The consensus from the breast conference included neoadjuvant endocrine therapy bilateral mastectomies with immediate reconstruction, and right axillary dissection and left sentinel node evaluation. Her tumor will be sent for mammaprint as well to determine the role for radiotherapy. She would benefit for right chest wall and regional node radiotherapy, and at this time we would not anticipate treatment for the left unless she had high risk features at the time of surgery. Dr. Lisbeth Renshaw spent time discussing radiation therapy, and discussed the risks, benefits, short, and long term effects of radiotherapy, and the patient is interested in proceeding. Dr. Lisbeth Renshaw discusses the delivery and logistics of radiotherapy and would anticipate a course of 6 1/2 weeks. We will see her back about 2 weeks after surgery (if there is not a role for chemotherapy) to move forward with the simulation and planning process and anticipate starting radiotherapy about 4 weeks after surgery.  2. Possible genetic predisposition to malignancy. Given the nature of her breast cancer being  bilateral as well as her family history makes her eligible for genetic testing. She will be referred for this.   In a visit lasting 45 minutes, greater than 50% of the time was spent face to face discussing her case, and coordinating the patient's care.   The above documentation reflects my direct findings during this shared patient visit. Please see the separate note by Dr. Lisbeth Renshaw on this date for the remainder of the patient's plan of care.    Carola Rhine, PAC

## 2016-12-23 NOTE — Therapy (Signed)
[place], [place], [address] Phone: [phone]   Fax:  [phone]  Physical Therapy Evaluation  Patient Details  Name: Amanda Williams MRN: [id number] Date of Birth: [birth year] Referring Provider: Dr. Excell Seltzer  Encounter Date: 12/23/2016      PT End of Session - 12/23/16 1609    Visit Number 1   Number of Visits 2   PT Start Time 1450   PT Stop Time 1527   PT Time Calculation (min) 37 min   Activity Tolerance Patient tolerated treatment well   Behavior During Therapy [facility] for tasks assessed/performed      Past Medical History:  Diagnosis Date  . Allergy   . Hematuria    ? trigonitis  . Panic attack    echo 2009 normal    Past Surgical History:  Procedure Laterality Date  . BREAST BIOPSY     left  . KIDNEY STONE SURGERY  5 2004    There were no vitals filed for this visit.       Subjective Assessment - 12/23/16 1559    Subjective Patient reports she is here to be seen by her medical team for her newly diagnosed bilateral breast cancer.   Patient is accompained by: Family member   Pertinent History Patient was diagnosed on 12/10/16 with bilateral grade 1 invasive lobular carcinoma breast cancer. There are 3 masses on the left each measuring 1 cm located in vasrious quadrants. There are 4 masses on the right breast located in 3 quadrants measuring 2.2, 3.1, 2.5, and 4 cm. All masses that were biopsied are ER/PR positive and HER2 negative with a Ki67 of 10%. An axillary node on the right side was biopsied and found to be positive.   Patient Stated Goals Reduce lymphedema risk and learn post op shoulder ROM HEP   Currently in Pain? No/denies  Sometimes her back "goes out" and pain is 10/10. This is a long-term issue and not new.    Multiple Pain Sites No            [facility] PT Assessment - 12/23/16 0001      Assessment   Medical Diagnosis Bilateral breast cancer   Referring  Provider Dr. Excell Seltzer   Onset Date/Surgical Date 12/10/16   Hand Dominance Left   Prior Therapy none     Precautions   Precautions Other (comment)   Precaution Comments active cancer     Restrictions   Weight Bearing Restrictions No     Balance Screen   Has the patient fallen in the past 6 months No   Has the patient had a decrease in activity level because of a fear of falling?  No   Is the patient reluctant to leave their home because of a fear of falling?  No     Home Environment   Living Environment Private residence   Living Arrangements Spouse/significant other;Children  Lives with husband and 43 y.o. son; has 10 y.o. 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   Leisure She does yoga 2x/wk, walks 45 min 2x/wk     Cognition   Overall Cognitive Status Within Functional Limits for tasks assessed     Posture/Postural Control   Posture/Postural Control Postural limitations   Postural Limitations Rounded Shoulders;Forward head     ROM /  Strength   AROM / PROM / Strength AROM;Strength     AROM   AROM Assessment Site Shoulder;Cervical   Right/Left Shoulder Right;Left   Right Shoulder Extension 38 Degrees   Right Shoulder Flexion 159 Degrees   Right Shoulder ABduction 178 Degrees   Right Shoulder Internal Rotation 79 Degrees   Right Shoulder External Rotation 90 Degrees   Left Shoulder Extension 40 Degrees   Left Shoulder Flexion 155 Degrees   Left Shoulder ABduction 173 Degrees   Left Shoulder Internal Rotation 76 Degrees   Left Shoulder External Rotation 90 Degrees   Cervical Flexion WNL   Cervical Extension WNL   Cervical - Right Side Bend WNL   Cervical - Left Side Bend WNL   Cervical - Right Rotation WNL   Cervical - Left Rotation WNL     Strength   Overall Strength Within functional limits for tasks performed            LYMPHEDEMA/ONCOLOGY QUESTIONNAIRE - 12/23/16 1607      Type   Cancer Type Bilateral breast cancer     Lymphedema Assessments   Lymphedema Assessments Upper extremities     Right Upper Extremity Lymphedema   10 cm Proximal to Olecranon Process 26.3 cm   Olecranon Process 23.5 cm   10 cm Proximal to Ulnar Styloid Process 21.5 cm   Just Proximal to Ulnar Styloid Process 14.8 cm   Across Hand at [employer] 17.6 cm   At [place] of 2nd Digit 5.9 cm     Left Upper Extremity Lymphedema   10 cm Proximal to Olecranon Process 26.5 cm   Olecranon Process 23.9 cm   10 cm Proximal to Ulnar Styloid Process 20.3 cm   Just Proximal to Ulnar Styloid Process 14.5 cm   Across Hand at [employer] 17.2 cm   At [place] of 2nd Digit 5.8 cm         Objective measurements completed on examination: See above findings.                  PT Education - 12/23/16 1608    Education provided Yes   Education Details Lymphedema risk reduction and post op shoulder ROM HEP   Person(s) Educated Patient;Spouse   Methods Explanation;Demonstration;Handout   Comprehension Returned demonstration;Verbalized understanding              Breast Clinic Goals - 12/23/16 1622      Patient will be able to verbalize understanding of pertinent lymphedema risk reduction practices relevant to her diagnosis specifically related to skin care.   Time 1   Period Days   Status Achieved     Patient will be able to return demonstrate and/or verbalize understanding of the post-op home exercise program related to regaining shoulder range of motion.   Time 1   Period Days   Status Achieved     Patient will be able to verbalize understanding of the importance of attending the postoperative After Breast Cancer Class for further lymphedema risk reduction education and therapeutic exercise.   Time 1   Period Days   Status Achieved               Plan - 12/23/16 1609    Clinical  Impression Statement Patient was diagnosed on 12/10/16 with bilateral grade 1 invasive lobular carcinoma breast cancer. There are 3 masses on the left each measuring 1 cm located in various quadrants. There are 4 masses on the right breast located in 3  quadrants measuring 2.2, 3.1, 2.5, and 4 cm. All masses that were biopsied are ER/PR positive and HER2 negative with a Ki67 of 10%. An axillary node on the right side was biopsied and found to be positive. Her multidisciplinary medical team met prior to her assessments to determine a recommended treatment plan. She is planning to have a bilateral mastectomy, a right axillary node dissection, a left sentinel node biopsy with reconstruction followed by possible chemotherapy, right side radiation, and anti-estrogen therapy. She will benefit from post op PT to regain shoulder ROM and reduce lymphedema risk as her right arm will be at high risk.   History and Personal Factors relevant to plan of care: Unknown extent of disease; pt scheduled to have CT/bone scan; currently metastatic disease to axillary node   Clinical Presentation Evolving   Clinical Presentation due to: Unknown extent of disease   Clinical Decision Making Moderate   Rehab Potential Excellent   Clinical Impairments Affecting Rehab Potential None   PT Treatment/Interventions ADLs/Self Care Home Management;Therapeutic exercise;Patient/family education   PT Next Visit Plan Will f/u when referred by her plastic surgeon   PT Home Exercise Plan Post op shoulder ROM HEP   Consulted and Agree with Plan of Care Patient;Family member/caregiver   Family Member Consulted Husband      Patient will benefit from skilled therapeutic intervention in order to improve the following deficits and impairments:  Decreased range of motion, Impaired UE functional use, Pain, Decreased knowledge of precautions, Postural dysfunction  Visit Diagnosis: Malignant neoplasm of overlapping sites of right breast in female,  estrogen receptor positive ([place]) - Plan: PT plan of care cert/re-cert  Malignant neoplasm of overlapping sites of left breast in female, estrogen receptor positive ([place]) - Plan: PT plan of care cert/re-cert  Abnormal posture - Plan: PT plan of care cert/re-cert   Patient will follow up at outpatient cancer rehab 3-4 weeks following surgery.  If the patient requires physical therapy at that time, a specific plan will be dictated and sent to the referring physician for approval. The patient was educated today on appropriate basic range of motion exercises to begin post operatively and the importance of attending the After Breast Cancer class following surgery.  Patient was educated today on lymphedema risk reduction practices as it pertains to recommendations that will benefit the patient immediately following surgery.  She verbalized good understanding.      Problem List Patient Active Problem List   Diagnosis Date Noted  . Malignant neoplasm of overlapping sites of right breast in female, estrogen receptor positive ([place]) 12/23/2016  . Malignant neoplasm of overlapping 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 breast ([place]) 12/18/2016  . Exposure to communicable disease 08/04/2012  . Counseling about travel 08/04/2012  . Throat symptom 04/12/2012  . Visit for preventive health examination 10/18/2010  . Dense breasts 10/18/2010  . Pelvic pressure in female 05/20/2010  . PANIC ATTACK 03/27/2008  . MICROSCOPIC HEMATURIA 03/27/2008  . PALPITATIONS 02/02/2007  . ALLERGIC RHINITIS 10/07/2006    Annia Friendly, PT 12/23/16 4:27 PM  [facility] [place], [place], [address] Phone: [phone]   Fax:  [phone]  Name: Amanda Williams MRN: [id number] Date of Birth: 1965-11-05

## 2016-12-23 NOTE — Telephone Encounter (Signed)
Gave patient contrast.per 10/3 los

## 2016-12-24 ENCOUNTER — Ambulatory Visit (HOSPITAL_BASED_OUTPATIENT_CLINIC_OR_DEPARTMENT_OTHER): Payer: 59 | Admitting: Genetics

## 2016-12-24 ENCOUNTER — Encounter: Payer: Self-pay | Admitting: General Practice

## 2016-12-24 ENCOUNTER — Encounter: Payer: Self-pay | Admitting: Genetics

## 2016-12-24 DIAGNOSIS — Z170 Estrogen receptor positive status [ER+]: Secondary | ICD-10-CM | POA: Diagnosis not present

## 2016-12-24 DIAGNOSIS — C50811 Malignant neoplasm of overlapping sites of right female breast: Secondary | ICD-10-CM

## 2016-12-24 DIAGNOSIS — Z803 Family history of malignant neoplasm of breast: Secondary | ICD-10-CM | POA: Diagnosis not present

## 2016-12-24 NOTE — Assessment & Plan Note (Signed)
12/21/2016:Palpable lumps in the right breast with nipple inversion: Mammogram revealed skin thickening and distortion, ultrasound revealed 4 masses 3.1 cm at 11:00, 2.5 cm at 6:00, 4 cm at 8:00, 2.2 cm at 5:00 with 2 abnormal axillary lymph nodes: MRI revealed 7 cm abnormality on right breast, in addition 3.6 cm abnormality in the left breast and 2 additional masses 1 cm each; right breast: T4 N1 stage III B clinical stage; left breast: T2 N0 stage IB clinical stage  Right breast: Grade 1 Invasive lobular cancer, lymph node also positive, ER 70%, PR 90%, Ki-67 10%, HER-2 negative ratio 1.09 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 After reviewing the pathology in detail, we proceeded to discuss the different treatment options between surgery, radiation, chemotherapy, antiestrogen therapies.  Recommendation: 1. Bilateral mastectomies 2. Mammaprint testing on the right breast (if the patient has less than 3 involved lymph nodes) 3. Oncotype DX testing on the left breast 4. Systemic chemotherapy if she has high risk molecular testing or more than 4 positive lymph nodes 5. Adjuvant radiation therapy 6. Followed by adjuvant antiestrogen therapy with tamoxifen. 7. Genetic testing  Since the patient has several weeks to go before her surgery, I started her on tamoxifen 20 mg daily.  Tamoxifen counseling: We discussed the risks and benefits of tamoxifen. These include but not limited to insomnia, hot flashes, mood changes, vaginal dryness, and weight gain. Although rare, serious side effects including endometrial cancer, risk of blood clots were also discussed. We strongly believe that the benefits far outweigh the risks. Patient understands these risks  and consented to starting treatment.   I discussed with the patient that we will meet one week post surgery to discuss the final pathology report and determine appropriate treatment plan.

## 2016-12-24 NOTE — Progress Notes (Signed)
REFERRING PROVIDER: Nicholas Lose, MD [place], [place] [address]  PRIMARY PROVIDER:  Burnis Medin, MD  PRIMARY REASON FOR VISIT:  1. Malignant neoplasm of overlapping sites of right breast in female, estrogen receptor positive ([place])   2. Family history of breast cancer      HISTORY OF PRESENT ILLNESS:   Amanda Williams, a 51 y.o. female, was seen for a  cancer genetics consultation at the request of Dr. Lindi Adie due to a personal and family history of cancer.  Amanda Williams presents to clinic today to discuss the possibility of a hereditary predisposition to cancer, genetic testing, and to further clarify her future cancer risks, as well as potential cancer risks for family members.   In Sept 2018, at the age of 80, Amanda Williams was diagnosed with bilateral breast cancer.  She will be having a bilateral mastectomy followed by adjuvant radiation, antiestrogen therapy and possible adjuvant chemotherapy.     CANCER HISTORY:    Malignant neoplasm of overlapping sites of right breast in female, estrogen receptor positive ([place])   12/21/2016 Initial Diagnosis    Palpable lumps in the right breast with nipple inversion: Mammogram revealed skin thickening and distortion, ultrasound revealed 4 masses 3.1 cm at 11:00, 2.5 cm at 6:00, 4 cm at 8:00, 2.2 cm at 5:00 with 2 abnormal axillary lymph nodes: MRI revealed 7 cm abnormality on right breast, in addition 3.6 cm abnormality in the left breast and 2 additional masses 1 cm each; right breast: T4 N1 stage III B clinical stage; left breast: T2 N0 stage IB clinical stage      12/21/2016 Pathology Results    Right breast: Grade 1 Invasive lobular cancer, lymph node also positive, ER 70%, PR 90%, Ki-67 10%, HER-2 negative ratio 1.09 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  OCP use for approximately 30 years.  Ovaries  intact: yes.  Hysterectomy: no.  Menopausal status: perimenopausal.  HRT use: 1 and 1.5 years. Colonoscopy: yes; reportedly normal. Mammogram within the last year: yes.  Past Medical History:  Diagnosis Date  . Allergy   . Breast cancer ([place])   . Family history of breast cancer   . Hematuria    ? trigonitis  . Panic attack    echo 2009 normal    Past Surgical History:  Procedure Laterality Date  . BREAST BIOPSY     left  . KIDNEY STONE SURGERY  5 2004    Social History   Social History  . Marital status: Married    Spouse name: N/A  . Number of children: N/A  . Years of education: N/A   Social History Main Topics  . Smoking status: Never Smoker  . Smokeless tobacco: Never Used  . Alcohol use 2.4 oz/week    4 Glasses of wine per week     Comment: ocassionally  . Drug use: No  . Sexual activity: Not Asked   Other Topics Concern  . None   Social History Narrative   hhof  4    Pets dog and cat.   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    Has smoke detector and wears seat belts.  No firearms. No excess sun exposure. Sees dentist  regularly .            FAMILY HISTORY:  We obtained a detailed, 4-generation family history.  Significant diagnoses are listed below: Family History  Problem Relation Age of Onset  . Breast cancer Maternal Grandmother 87   [name] has 2 sons ages 39 and 75 who have no history of cancer.  [name] has no biological siblings.  She does not have any information about her father and his side of the family.    Amanda Williams mother is 45 with no history of cancer.  She still has her uterus and ovaries intact as far as she is aware.  Amanda Williams has 1 maternal aunt who is 34 with no history of cancer.  She has her ovaries and uterus intact.  This aunt has 1 son who is 9 with no history of cancer.  Amanda Williams maternal grandfather is 49 without any history of cancer. Amanda Williams maternal grandmother  was diagnosed with breast cancer at 33 and is now 22.  This grandmother's father had lung cancer (hx smoking) and her mother died 'young' from pneumonia.    Amanda Williams is unaware of previous family history of genetic testing for hereditary cancer risks. Patient reports that she does not know her mother's side's descent.  Paternal ancestors are of Caucasian descent. There is no reported Ashkenazi Jewish ancestry. There is no known consanguinity.  GENETIC COUNSELING ASSESSMENT: [name] is a 51 y.o. female with a personal and family history which is somewhat suggestive of a Hereditary Cancer Predisposition Syndrome. We, therefore, discussed and recommended the following at today's visit.   DISCUSSION: We reviewed the characteristics, features and inheritance patterns of hereditary cancer syndromes. We also discussed genetic testing, including the appropriate family members to test, the process of testing, insurance coverage and turn-around-time for results. We discussed the implications of a negative, positive and/or variant of uncertain significant result. We recommended Amanda Williams pursue genetic testing for the Multi-Cancer gene panel. The Multi-Cancer Panel offered by [facility] includes sequencing and/or deletion duplication testing of the following 83 genes: ALK, APC, ATM, AXIN2,BAP1,  BARD1, BLM, BMPR1A, BRCA1, BRCA2, BRIP1, CASR, CDC73, CDH1, CDK4, CDKN1B, CDKN1C, CDKN2A (p14ARF), CDKN2A (p16INK4a), CEBPA, CHEK2, CTNNA1, DICER1, DIS3L2, EGFR (c.2369C>T, p.Thr790Met variant only), EPCAM (Deletion/duplication testing only), FH, FLCN, GATA2, GPC3, GREM1 (Promoter region deletion/duplication testing only), HOXB13 (c.251G>A, p.Gly84Glu), HRAS, KIT, MAX, MEN1, MET, MITF (c.952G>A, p.Glu318Lys variant only), MLH1, MSH2, MSH3, MSH6, MUTYH, NBN, NF1, NF2, NTHL1, PALB2, PDGFRA, PHOX2B, PMS2, POLD1, POLE, POT1, PRKAR1A, PTCH1, PTEN, RAD50, RAD51C, RAD51D, RB1, RECQL4, RET, RUNX1, SDHAF2, SDHA (sequence changes  only), SDHB, SDHC, SDHD, SMAD4, SMARCA4, SMARCB1, SMARCE1, STK11, SUFU, TERC, TERT, TMEM127, TP53, TSC1, TSC2, VHL, WRN and WT1.   We discussed that only 5-10% of cancers are associated with a Hereditary cancer predisposition syndrome.  One of the most common hereditary cancer syndromes that increases breast cancer risk is called Hereditary Breast and Ovarian Cancer (HBOC) syndrome.  This syndrome is caused by mutations in the BRCA1 and BRCA2 genes.  This syndrome increases an individual's lifetime risk to develop breast, ovarian, pancreatic, and other types of cancer.  There are also many other cancer predisposition syndromes caused by mutations in several other genes.  We discussed that if she is found to have a mutation in one of these genes, it may impact future medical management recommendations such as increased cancer screenings and consideration of risk reducing surgeries.  A positive result could also have implications for the  patient's family members.  A Negative result would mean we were unable to identify a hereditary component to her cancer, but does not rule out the possibility of a hereditary basis for her cancer.  There could be mutations that are undetectable by current technology, or in genes not yet tested or identified to increase cancer risk.    We discussed the potential to find a Variant of Uncertain Significance or VUS.  These are variants that have not yet been identified as pathogenic or benign, and it is unknown if this variant is associated with increased cancer risk or if this is a normal finding.  Most VUS's are reclassified to benign or likely benign.   It should not be used to make medical management decisions. With time, we suspect the lab will determine the significance of any VUS's identified if any.   Based on Ms. Calderwood's personal and family history of cancer, she meets medical criteria for genetic testing. Despite that she meets criteria, she may still have an out of  pocket cost. We discussed that if her out of pocket cost for testing is over $100, the laboratory will call and confirm whether she wants to proceed with testing.  If the out of pocket cost of testing is less than $100 she will be billed by the genetic testing laboratory.   We discussed that some people do not want to undergo genetic testing due to fear of genetic discrimination.  A federal law called the Genetic Information Non-Discrimination Act (GINA) of 2008 helps protect individuals against genetic discrimination based on their genetic test results.  It impacts both health insurance and employment.  For health insurance, it protects against increased premiums, being kicked off insurance or being forced to take a test in order to be insured.  For employment it protects against hiring, firing and promoting decisions based on genetic test results.  Health status due to a cancer diagnosis is not protected under GINA.  This law does not protect life insurance, disability insurance, or other types of insurance.   PLAN: After considering the risks, benefits, and limitations, Ms. Riches  provided informed consent to pursue genetic testing and the blood sample was sent to [facility] for analysis of the Multi-Cancer Panel. Results should be available within approximately 2-3 weeks' time, at which point they will be disclosed by telephone to Ms. Gravett, as will any additional recommendations warranted by these results. Ms. Groom will receive a summary of her genetic counseling visit and a copy of her results once available. This information will also be available in Epic. We encouraged Ms. Pangborn to remain in contact with cancer genetics annually so that we can continuously update the family history and inform her of any changes in cancer genetics and testing that may be of benefit for her family. Ms. Pack questions were answered to her satisfaction today. Our contact information was provided should  additional questions or concerns arise.  Lastly, we encouraged Ms. Wollam to remain in contact with cancer genetics annually so that we can continuously update the family history and inform her of any changes in cancer genetics and testing that may be of benefit for this family.   Ms.  Tarazon questions were answered to her satisfaction today. Our contact information was provided should additional questions or concerns arise. Thank you for the referral and allowing [place] to share in the care of your patient.   Tana Felts, MS Genetic Counselor [email or web address] phone: [phone]  The patient was seen  for a total of 35 minutes in face-to-face genetic counseling.  This patient was discussed with Drs. Magrinat, Lindi Adie and/or Burr Medico who agrees with the above.

## 2016-12-24 NOTE — Progress Notes (Signed)
[facility] Psychosocial Distress Screening [place] by phone following Breast Multidisciplinary Clinic to introduce [facility] team/resources, reviewing distress screen per protocol.  The patient scored a 7 on the Psychosocial Distress Thermometer which indicates severe distress. Also assessed for distress and other psychosocial needs.   ONCBCN DISTRESS SCREENING 12/24/2016  Screening Type Initial Screening  Distress experienced in past week (1-10) 7  Emotional problem type Nervousness/Anxiety;Adjusting to illness  Information Concerns Type Lack of info about diagnosis;Lack of info about treatment  Physical Problem type Pain;Sleep/insomnia  Referral to support programs Yes   After [facility], Soumya is processing the unexpected scope and involvement of her treatment plan.  Per pt, she has good support from family/friends and no additional concerns beyond preparing for a long and invasive treatment process.   Provided empathic listening and pastoral reflection, normalizing feelings and use of Support Center team/programming resources for coping.  Encouraged table massages, Breast Cancer Support Group, [facility], and other programs as means for deep self-care and connection with other people who have lived some of the same questions.   Follow up needed: No. Geralyn knows to contact Support Team whenever desired, but please also page if immediate needs arise or circumstances change.  Thank you.   [place], [place], [facility] Pager [phone] Voicemail [phone]

## 2016-12-24 NOTE — Progress Notes (Signed)
[facility] NOTE  Patient Care Team: Panosh, Standley Brooking, MD as PCP - General Delsa Bern, MD (Obstetrics and Gynecology) Excell Seltzer, MD as Consulting Physician (General Surgery) Nicholas Lose, MD as Consulting Physician (Hematology and Oncology) Kyung Rudd, MD as Consulting Physician (Radiation Oncology)  CHIEF COMPLAINTS/PURPOSE OF CONSULTATION:  Newly diagnosed breast cancer  HISTORY OF PRESENTING ILLNESS:  Amanda Williams 51 y.o. female is here because of recent diagnosis of bilateral breast cancers. Patient felt lumps in the right breast for the last [redacted] weeks along with nipple inversion. She had extremely dense breasts and the mammogram revealed skin thickening along with distortion the breast. Ultrasound revealed multiple masses in the right breast measuring 3.1 cm #2.5 cm, 4 cm, 2.2 cm. She also had 2 enlarged abnormal axillary lymph nodes. These breast masses and the lymph nodes were biopsied and they came back as grade 1 invasive lobular cancer that was ER/PR positive HER-2 negative with a Ki-67 of 10%. She had a breast MRI that revealed a 7 cm area of abnormality in the right breast in the following 3 spiculated masses but also in the left breast she had new 3.6 cm mass along with satellite lesions measuring 1 cm each in the retroareolar region. 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 She was presented this morning in the multidisciplinary tumor board and she is here today accompanied by her husband to discuss the treatment plan.  I reviewed her records extensively and collaborated the history with the patient.  SUMMARY OF ONCOLOGIC HISTORY:   Malignant neoplasm of overlapping sites of right breast in female, estrogen receptor positive ([place])   12/21/2016 Initial Diagnosis    Palpable lumps in the right breast with nipple inversion: Mammogram revealed skin thickening and  distortion, ultrasound revealed 4 masses 3.1 cm at 11:00, 2.5 cm at 6:00, 4 cm at 8:00, 2.2 cm at 5:00 with 2 abnormal axillary lymph nodes: MRI revealed 7 cm abnormality on right breast, in addition 3.6 cm abnormality in the left breast and 2 additional masses 1 cm each; right breast: T4 N1 stage III B clinical stage; left breast: T2 N0 stage IB clinical stage      12/21/2016 Pathology Results    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 Allergy   . Hematuria    ? trigonitis  . Panic attack    echo 2009 normal    SURGICAL HISTORY: Past Surgical History:  Procedure Laterality Date  . BREAST BIOPSY     left  . KIDNEY STONE SURGERY  5 2004    SOCIAL HISTORY: Social History   Social History  . Marital status: Married    Spouse name: N/A  . Number of children: N/A  . Years of education: N/A   Occupational History  . Not on file.   Social History Main Topics  . Smoking status: Never Smoker  . Smokeless tobacco: Never Used  . Alcohol use 2.4 oz/week    4 Glasses of wine per week     Comment: ocassionally  . Drug use: No  . Sexual activity: Not on file   Other Topics Concern  . Not on file  Social History Narrative   hhof  4    Pets dog and cat.   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    Has smoke detector and wears seat belts.  No firearms. No excess sun exposure. Sees dentist regularly .           FAMILY HISTORY: Family History  Problem Relation Age of Onset  . Breast cancer Maternal Grandmother     ALLERGIES:  is allergic to penicillins and sulfonamide derivatives.  MEDICATIONS:  Current Outpatient Prescriptions  Medication Sig Dispense Refill  . sertraline (ZOLOFT) 100 MG tablet Take 100 mg by mouth daily.      . tamoxifen (NOLVADEX) 20 MG tablet Take 1 tablet (20 mg total) by mouth daily. 30 tablet 0   No current facility-administered medications for this visit.     REVIEW OF SYSTEMS:   Constitutional: Denies fevers, chills or abnormal night sweats Eyes: Denies blurriness of vision, double vision or watery eyes Ears, nose, mouth, throat, and face: Denies mucositis or sore throat Respiratory: Denies cough, dyspnea or wheezes Cardiovascular: Denies palpitation, chest discomfort or lower extremity swelling Gastrointestinal:  Denies nausea, heartburn or change in bowel habits Skin: Denies abnormal skin rashes Lymphatics: Denies new lymphadenopathy or easy bruising Neurological:Denies numbness, tingling or new weaknesses Behavioral/Psych: Mood is stable, no new changes  Breast: Palpable lump in the right breast with nipple inversion All other systems were reviewed with the patient and are negative.  PHYSICAL EXAMINATION: ECOG PERFORMANCE STATUS: 1 - Symptomatic but completely ambulatory  Vitals:   12/23/16 1246  BP: 118/75  Pulse: 68  Resp: 17  Temp: 98.5 F (36.9 C)  SpO2: 100%   Filed Weights   12/23/16 1246  Weight: 150 lb 4.8 oz (68.2 kg)    GENERAL:alert, no distress and comfortable SKIN: skin color, texture, turgor are normal, no rashes or significant lesions EYES: normal, conjunctiva are pink and non-injected, sclera clear OROPHARYNX:no exudate, no erythema and lips, buccal mucosa, and tongue normal  NECK: supple, thyroid normal size, non-tender, without nodularity LYMPH:  no palpable lymphadenopathy in the cervical, axillary or inguinal LUNGS: clear to auscultation and percussion with normal breathing effort HEART: regular rate & rhythm and no murmurs and no lower extremity edema ABDOMEN:abdomen soft, non-tender and normal bowel sounds Musculoskeletal:no cyanosis of digits and no clubbing  PSYCH: alert & oriented x 3 with fluent speech NEURO: no focal motor/sensory  deficits BREAST: Palpable lump in the right breast nipple inversion. I could not palpate any axillary or supraclavicular lymphadenopathy (exam performed in the presence of a chaperone)   LABORATORY DATA:  I have reviewed the data as listed Lab Results  Component Value Date   WBC 7.2 12/23/2016   HGB 13.0 12/23/2016   HCT 38.9 12/23/2016   MCV 89.6 12/23/2016   PLT 346 12/23/2016   Lab Results  Component Value Date   NA 140 12/23/2016   K 4.0 12/23/2016   CL 101 05/12/2016   CO2 26 12/23/2016    RADIOGRAPHIC STUDIES: I have personally reviewed the radiological reports and agreed with the findings in the report.  ASSESSMENT AND PLAN:  Malignant neoplasm of overlapping sites of right breast in female, estrogen receptor positive ([place]) 12/21/2016:Palpable lumps in the right breast with nipple inversion: Mammogram revealed skin thickening and distortion, ultrasound revealed 4 masses 3.1 cm at 11:00, 2.5 cm at 6:00, 4  cm at 8:00, 2.2 cm at 5:00 with 2 abnormal axillary lymph nodes: MRI revealed 7 cm abnormality on right breast, in addition 3.6 cm abnormality in the left breast and 2 additional masses 1 cm each; right breast: T4 N1 stage III B clinical stage; left breast: T2 N0 stage IB clinical stage  Right breast: Grade 1 Invasive lobular cancer, lymph node also positive, ER 70%, PR 90%, Ki-67 10%, HER-2 negative ratio 1.09 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 After reviewing the pathology in detail, we proceeded to discuss the different treatment options between surgery, radiation, chemotherapy, antiestrogen therapies.  Recommendation: 1. Bilateral mastectomies 2. Mammaprint testing on the right breast (if the patient has less than 3  involved lymph nodes) 3. Oncotype DX testing on the left breast 4. Systemic chemotherapy if she has high risk molecular testing or more than 4 positive lymph nodes 5. Adjuvant radiation therapy 6. Followed by adjuvant antiestrogen therapy with tamoxifen. 7. Genetic testing  Since the patient has several weeks to go before her surgery, I started her on tamoxifen 20 mg daily.  Tamoxifen counseling: We discussed the risks and benefits of tamoxifen. These include but not limited to insomnia, hot flashes, mood changes, vaginal dryness, and weight gain. Although rare, serious side effects including endometrial cancer, risk of blood clots were also discussed. We strongly believe that the benefits far outweigh the risks. Patient understands these risks and consented to starting treatment.   I discussed with the patient that we will meet one week post surgery to discuss the final pathology report and determine appropriate treatment plan.     All questions were answered. The patient knows to call the clinic with any problems, questions or concerns.    Rulon Eisenmenger, MD 12/24/16

## 2016-12-25 ENCOUNTER — Ambulatory Visit (HOSPITAL_COMMUNITY): Payer: 59

## 2016-12-28 ENCOUNTER — Encounter (HOSPITAL_COMMUNITY): Payer: Self-pay

## 2016-12-28 ENCOUNTER — Telehealth: Payer: Self-pay | Admitting: *Deleted

## 2016-12-28 ENCOUNTER — Encounter (HOSPITAL_COMMUNITY)
Admission: RE | Admit: 2016-12-28 | Discharge: 2016-12-28 | Disposition: A | Discharge status: Home / Self Care | Payer: 59 | Source: Ambulatory Visit | Attending: Hematology and Oncology | Admitting: Hematology and Oncology

## 2016-12-28 DIAGNOSIS — C50811 Malignant neoplasm of overlapping sites of right female breast: Secondary | ICD-10-CM

## 2016-12-28 DIAGNOSIS — Z170 Estrogen receptor positive status [ER+]: Secondary | ICD-10-CM

## 2016-12-28 MED ORDER — IOPAMIDOL (ISOVUE-300) INJECTION 61%
INTRAVENOUS | Status: AC
  Filled 2016-12-28: qty 100

## 2016-12-28 MED ORDER — TECHNETIUM TC 99M MEDRONATE IV KIT
21.3000 | PACK | Freq: Once | INTRAVENOUS | Status: AC | PRN
  Administered 2016-12-28: 21.3 via INTRAVENOUS

## 2016-12-28 MED ORDER — IOPAMIDOL (ISOVUE-300) INJECTION 61%
100.0000 mL | Freq: Once | INTRAVENOUS | Status: AC | PRN
  Administered 2016-12-28: 100 mL via INTRAVENOUS

## 2016-12-29 ENCOUNTER — Encounter: Payer: Self-pay | Admitting: General Practice

## 2016-12-29 NOTE — Telephone Encounter (Signed)
  Oncology Nurse Navigator Documentation  Navigator Location: [facility] (12/28/16 1400)   )Navigator Encounter Type: Telephone (12/28/16 1400) Telephone: Outgoing Call;Clinic/[facility] Follow-up (12/28/16 1400)                                                  Time Spent with Patient: 15 (12/28/16 1400)

## 2016-12-29 NOTE — Progress Notes (Signed)
[facility] Spiritual Care Note  Referred by Bryson Ha Perkins/PA via Vernie Shanks Elmore/LCSW for assistance with thinking through how to talk with pt's 5 and 51yo children about dx/tx/px.  Netta Neat at an inconvenient time, so she plans to call back later.  [place], [place], [facility] Pager [phone] Voicemail [phone]

## 2017-01-05 ENCOUNTER — Ambulatory Visit: Payer: Self-pay | Admitting: Genetics

## 2017-01-05 DIAGNOSIS — Z803 Family history of malignant neoplasm of breast: Secondary | ICD-10-CM

## 2017-01-05 DIAGNOSIS — Z170 Estrogen receptor positive status [ER+]: Secondary | ICD-10-CM

## 2017-01-05 DIAGNOSIS — Z1379 Encounter for other screening for genetic and chromosomal anomalies: Secondary | ICD-10-CM | POA: Insufficient documentation

## 2017-01-05 DIAGNOSIS — C50811 Malignant neoplasm of overlapping sites of right female breast: Secondary | ICD-10-CM

## 2017-01-07 ENCOUNTER — Encounter: Payer: Self-pay | Admitting: Genetics

## 2017-01-07 ENCOUNTER — Other Ambulatory Visit: Payer: Self-pay | Admitting: General Surgery

## 2017-01-07 DIAGNOSIS — C50812 Malignant neoplasm of overlapping 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 breast: Secondary | ICD-10-CM

## 2017-01-07 NOTE — Progress Notes (Signed)
HPI: Ms. Wassenaar was previously seen in the [facility] on 12/24/2016 due to a personal and family history of breast cancer and concerns regarding a hereditary predisposition to cancer. Please refer to our prior cancer genetics clinic note for more information regarding Ms. Reindel's medical, social and family histories, and our assessment and recommendations, at the time. Ms. Husser recent genetic test results were disclosed to her, as well as recommendations warranted by these results. These results and recommendations are discussed in more detail below.  CANCER HISTORY:    Malignant neoplasm of overlapping sites of right breast in female, estrogen receptor positive ([place])   12/21/2016 Initial Diagnosis    Palpable lumps in the right breast with nipple inversion: Mammogram revealed skin thickening and distortion, ultrasound revealed 4 masses 3.1 cm at 11:00, 2.5 cm at 6:00, 4 cm at 8:00, 2.2 cm at 5:00 with 2 abnormal axillary lymph nodes: MRI revealed 7 cm abnormality on right breast, in addition 3.6 cm abnormality in the left breast and 2 additional masses 1 cm each; right breast: T4 N1 stage III B clinical stage; left breast: T2 N0 stage IB clinical stage      12/21/2016 Pathology Results    Right breast: Grade 1 Invasive lobular cancer, lymph node also positive, ER 70%, PR 90%, Ki-67 10%, HER-2 negative ratio 1.09 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  The Multi-Cancer Panel was ordered. The Multi-Cancer Panel offered by [facility] includes sequencing and/or deletion duplication testing of the following 83 genes: ALK, APC, ATM, AXIN2,BAP1,  BARD1, BLM, BMPR1A, BRCA1, BRCA2, BRIP1, CASR, CDC73, CDH1, CDK4, CDKN1B, CDKN1C, CDKN2A (p14ARF), CDKN2A (p16INK4a), CEBPA, CHEK2, CTNNA1, DICER1, DIS3L2, EGFR  (c.2369C>T, p.Thr790Met variant only), EPCAM (Deletion/duplication testing only), FH, FLCN, GATA2, GPC3, GREM1 (Promoter region deletion/duplication testing only), HOXB13 (c.251G>A, p.Gly84Glu), HRAS, KIT, MAX, MEN1, MET, MITF (c.952G>A, p.Glu318Lys variant only), MLH1, MSH2, MSH3, MSH6, MUTYH, NBN, NF1, NF2, NTHL1, PALB2, PDGFRA, PHOX2B, PMS2, POLD1, POLE, POT1, PRKAR1A, PTCH1, PTEN, RAD50, RAD51C, RAD51D, RB1, RECQL4, RET, RUNX1, SDHAF2, SDHA (sequence changes only), SDHB, SDHC, SDHD, SMAD4, SMARCA4, SMARCB1, SMARCE1, STK11, SUFU, TERC, TERT, TMEM127, TP53, TSC1, TSC2, VHL, WRN and WT1.   Results: Negative, no pathogenic variants identified.  The date of this test report is 01/04/2017.          FAMILY HISTORY:  We obtained a detailed, 4-generation family history.  Significant diagnoses are listed below: Family History  Problem Relation Age of Onset  . Breast cancer Maternal Grandmother 87   Ms. Martindelcampo has 2 sons ages 38 and 70 who have no history of cancer.  Ms. Trigueros has no biological siblings.  She does not have any information about her father and his side of the family.    Ms. Hausner mother is 35 with no history of cancer.  She still has her uterus and ovaries intact as far as she is aware.  Ms. Salem has 1 maternal aunt who is 71 with no history of cancer.  She has her ovaries and uterus intact.  This aunt has 1 son who is 88 with no history of cancer.  Ms. Rickles maternal grandfather is 45 without any history of cancer. Ms. Yeagle maternal grandmother was diagnosed with breast cancer at 22 and is now 21.  This grandmother's father had lung  cancer (hx smoking) and her mother died 'young' from pneumonia.    Ms. Kressin is unaware of previous family history of genetic testing for hereditary cancer risks. Patient reports that she does not know her mother's side's descent.  Paternal ancestors are of Caucasian descent. There is no reported Ashkenazi Jewish ancestry. There is no known  consanguinity.  GENETIC TEST RESULTS: Genetic testing performed through [facility]'s Multi-Cancer Panel reported out on 01/04/2017 showed no pathogenic mutations. The Multi-Cancer Panel offered by [facility] includes sequencing and/or deletion duplication testing of the following 83 genes: ALK, APC, ATM, AXIN2,BAP1,  BARD1, BLM, BMPR1A, BRCA1, BRCA2, BRIP1, CASR, CDC73, CDH1, CDK4, CDKN1B, CDKN1C, CDKN2A (p14ARF), CDKN2A (p16INK4a), CEBPA, CHEK2, CTNNA1, DICER1, DIS3L2, EGFR (c.2369C>T, p.Thr790Met variant only), EPCAM (Deletion/duplication testing only), FH, FLCN, GATA2, GPC3, GREM1 (Promoter region deletion/duplication testing only), HOXB13 (c.251G>A, p.Gly84Glu), HRAS, KIT, MAX, MEN1, MET, MITF (c.952G>A, p.Glu318Lys variant only), MLH1, MSH2, MSH3, MSH6, MUTYH, NBN, NF1, NF2, NTHL1, PALB2, PDGFRA, PHOX2B, PMS2, POLD1, POLE, POT1, PRKAR1A, PTCH1, PTEN, RAD50, RAD51C, RAD51D, RB1, RECQL4, RET, RUNX1, SDHAF2, SDHA (sequence changes only), SDHB, SDHC, SDHD, SMAD4, SMARCA4, SMARCB1, SMARCE1, STK11, SUFU, TERC, TERT, TMEM127, TP53, TSC1, TSC2, VHL, WRN and WT1. .  The test report will be scanned into EPIC and will be located under the Molecular Pathology section of the Results Review tab.A portion of the result report is included below for reference.     We discussed with Ms. Croston that because current genetic testing is not perfect, it is possible there may be a gene mutation in one of these genes that current testing cannot detect, but that chance is small. We also discussed, that there could be another gene that has not yet been discovered, or that we have not yet tested, that is responsible for the cancer diagnoses in the family. Therefore, it is important to remain in touch with cancer genetics in the future so that we can continue to offer Ms. Corti the most up to date genetic testing.   ADDITIONAL GENETIC TESTING: We discussed with Ms. Hougland that her genetic testing was fairly extensive.  If there are  are genes identified to increase cancer risk that can be analyzed in the future, we would be happy to discuss and coordinate this testing at that time.    CANCER SCREENING RECOMMENDATIONS: Ms. Rothschild test result was negative (normal).  This means that we have not identified a hereditary cause for her personal history of bilateral breast cancer or family history of cancer.   While reassuring, this does not definitively rule out a hereditary basis for your cancer. It is still possible that there could be genetic mutations that are undetectable by current technology, or genetic mutations in genes that have not been tested or identified to increase cancer risk.  Therefore, Ms. Hergert was advised to continue following the cancer screening guidelines provided by her primary healthcare providers. Other factors such as her personal and family history may still affect her cancer risk.     RECOMMENDATIONS FOR FAMILY MEMBERS: Women in this family might be at some increased risk of developing cancer, over the general population risk, simply due to the family history of cancer. We recommended women in this family have a yearly mammogram beginning at age 25, or 66 years younger than the earliest onset of cancer, an annual clinical breast exam, and perform monthly breast self-exams. Women in this family should also have a gynecological exam as recommended by their primary provider. All family members should have a colonoscopy by  age 39.  FOLLOW-UP: Lastly, we discussed with Ms. Valera that cancer genetics is a rapidly advancing field and it is possible that new genetic tests will be appropriate for her and/or her family members in the future. We encouraged her to remain in contact with cancer genetics on an annual basis so we can update her personal and family histories and let her know of advances in cancer genetics that may benefit this family.   Our contact number was provided. Ms. Truszkowski questions were  answered to her satisfaction, and she knows she is welcome to call us at anytime with additional questions or concerns.   Ferol Luz, MS Genetic Counselor [email or web address]

## 2017-01-08 ENCOUNTER — Telehealth: Payer: Self-pay | Admitting: Genetics

## 2017-01-08 ENCOUNTER — Encounter (HOSPITAL_BASED_OUTPATIENT_CLINIC_OR_DEPARTMENT_OTHER): Payer: Self-pay | Admitting: *Deleted

## 2017-01-08 NOTE — Telephone Encounter (Signed)
Revealed negative genetic testing.  This normal result is reassuring and indicates that it is unlikely Amanda Williams's cancer is due to a hereditary cause.  It is unlikely that there is an increased risk of another cancer due to a mutation in one of these genes.  However, genetic testing is not perfect, and cannot definitively rule out a hereditary cause.  It will be important for her to keep in contact with genetics to learn if any additional testing may be needed in the future.

## 2017-01-09 NOTE — H&P (Signed)
Subjective:     Patient ID: Amanda Williams is a 51 y.o. female.  HPI  Referred for consultation breast reconstruction. Presented with palpable mass with nipple inversion of the right breast. MMG noted distortion in the posterior aspect of the breast and thickening of the skin. US of RIGHT breast revealed a 3.1 cm mass at 11:00, 2.5 cm mass at 6:00, 4 cm mass at 8:00, and a 2.2 cm mass at 5:00. In the axilla 2 of the lymph nodes had cortical thickening. Biopsy of the right breast at the 11:00, 5:30, and 8:00 positions showed ILC, ER/PR +, HER2 -.  The axillary node was positive for carcinoma.   MRI demonstrated enhancement of the skin, with patchy nodular enhancement of bilateral breasts. Within the RIGHT breast, there was diffuse enhancement in all 4 quadrants. On the LEFT there were three spiculated masses 4 quadrants. LEFT breast US revealed 3.6 cm mass at 9:00 and 1 cm mass at 12:00, and her axilla was negative. No US correlate for third MRI site. Biopsy of two sites noted on US with IDC.  Plan for bilateral mastectomies. She will receive post mastectomy radiation on RIGHT. Plan mammaprint right, oncotype left. Genetics negative. Tamoxifen started neoadjuvantly as she awaits scheduling and completion work up.  Staging scans negative  Current 38 B, desires larger. Wt stable. Prior left breast benign excisional biopsy.  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 Have 2 teenage children at home. [facility] have [facility].  Review of Systems 12 point review negative    Objective:   Physical Exam  Cardiovascular: Normal rate.  Normal heart sounds. Pulmonary/Chest: Effort normal. Clear to ausculatation Abdominal: Soft.  Small to moderate volume redundant tissue, no panniculus no hernia   Right breast firm large mass occupying majority breast Left breast with radial scar medial breast grade1 ptosis bilat   SN to nipple R 24.5 L 26 BW R 17 L  17 (CW 13 cm) Nipple to IMF R 9.5 L 9.5 cm  Assessment:     Right breast ca multicentric (ILC) ER+ Left breast ca multicentric (IDC)    Plan:     Reviewed options prosthesis and provided brochure for [employer]. Discussed immediate vs delayed, autologous vs implant based reconstruction. Reviewed incisions, drains, OR length, hospital stay and recovery for each. Discussed process of expansion and implant based risks including rupture, MRI surveillance for silicone implants, infection requiring surgery or removal, contracture. Discussed TRAM vs DIEP, latter would require treatment at tertiary center. Discussed abdominal based complications including failure flap, abdominal bulge or hernia. My opinion is that volume of her abdomen would result in smaller than current volume breast. Reviewed microsurgeon would have other options in addition to abdomen if autologous is her goal.  Discussed future surgery dependent on adjuvant treatments. This includes radiation- discussed this significantly increases risk reconstruction including wound healing problems capsular contracture. Options would be to delay reconstruction and pursue LD + TE on radiated side TE alone if no radiation or or DIEP. Alternative would be placement expanders at time of mastectomy- the benefit of this would be to retain breast footprint. However this would then mean expanders would be in place for several months (approximately 6 months from end of radiation) before continuing reconstruction process. At that time could do implant exchange alone, implant exchange with LD flap for radiated chest, or coversion to autologous.   Plan immediate expander based reconstruction prepectoral. Use of   ADM in breast reconstruction discussed, cadaveric source. Risks of this include seroma, and in short term can act as nidus for infection and may require removal.  Reviewed SSM will be asensate and not stimulate. Reviewed risks mastectomy flap  necrosis requiring additional surgery.   Reviewed portfolio, examples of expanders and implants.  Irene Limbo, MD [facility] Plastic & Reconstructive Surgery [phone], pin [phone]

## 2017-01-11 NOTE — Progress Notes (Signed)
PT in to pick up Ensure pre op drink.  Instructions reviewed.

## 2017-01-12 ENCOUNTER — Ambulatory Visit (HOSPITAL_COMMUNITY)
Admission: RE | Admit: 2017-01-12 | Discharge: 2017-01-13 | Disposition: A | Discharge status: Home / Self Care | Payer: 59 | Source: Ambulatory Visit | Attending: General Surgery | Admitting: General Surgery

## 2017-01-12 ENCOUNTER — Ambulatory Visit (HOSPITAL_BASED_OUTPATIENT_CLINIC_OR_DEPARTMENT_OTHER): Payer: 59 | Admitting: Anesthesiology

## 2017-01-12 ENCOUNTER — Encounter (HOSPITAL_BASED_OUTPATIENT_CLINIC_OR_DEPARTMENT_OTHER)
Admission: RE | Disposition: A | Discharge status: Home / Self Care | Payer: Self-pay | Source: Ambulatory Visit | Attending: General Surgery

## 2017-01-12 ENCOUNTER — Encounter (HOSPITAL_BASED_OUTPATIENT_CLINIC_OR_DEPARTMENT_OTHER): Payer: Self-pay | Admitting: Anesthesiology

## 2017-01-12 ENCOUNTER — Ambulatory Visit (HOSPITAL_COMMUNITY)
Admission: RE | Admit: 2017-01-12 | Discharge: 2017-01-12 | Disposition: A | Discharge status: Home / Self Care | Payer: 59 | Source: Ambulatory Visit | Attending: General Surgery | Admitting: General Surgery

## 2017-01-12 DIAGNOSIS — Z1501 Genetic susceptibility to malignant neoplasm of breast: Secondary | ICD-10-CM | POA: Diagnosis not present

## 2017-01-12 DIAGNOSIS — C50911 Malignant neoplasm of unspecified site of right female breast: Secondary | ICD-10-CM | POA: Diagnosis not present

## 2017-01-12 DIAGNOSIS — C50912 Malignant neoplasm of unspecified site of left female breast: Secondary | ICD-10-CM | POA: Diagnosis not present

## 2017-01-12 DIAGNOSIS — F419 Anxiety disorder, unspecified: Secondary | ICD-10-CM | POA: Insufficient documentation

## 2017-01-12 DIAGNOSIS — C50812 Malignant neoplasm of overlapping 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 breast: Secondary | ICD-10-CM

## 2017-01-12 DIAGNOSIS — C773 Secondary and unspecified malignant neoplasm of axilla and upper limb lymph nodes: Secondary | ICD-10-CM | POA: Insufficient documentation

## 2017-01-12 DIAGNOSIS — Z79899 Other long term (current) drug therapy: Secondary | ICD-10-CM | POA: Insufficient documentation

## 2017-01-12 DIAGNOSIS — Z170 Estrogen receptor positive status [ER+]: Secondary | ICD-10-CM | POA: Insufficient documentation

## 2017-01-12 HISTORY — PX: MASTECTOMY W/ SENTINEL NODE BIOPSY: SHX2001

## 2017-01-12 HISTORY — PX: BREAST RECONSTRUCTION WITH PLACEMENT OF TISSUE EXPANDER AND FLEX HD (ACELLULAR HYDRATED DERMIS): SHX6295

## 2017-01-12 SURGERY — MASTECTOMY WITH SENTINEL LYMPH NODE BIOPSY
Anesthesia: General | Site: Chest | Laterality: Bilateral

## 2017-01-12 MED ORDER — ONDANSETRON HCL 4 MG/2ML IJ SOLN: 4.0000 mg | Freq: Once | INTRAMUSCULAR | Status: DC | PRN

## 2017-01-12 MED ORDER — FENTANYL CITRATE (PF) 100 MCG/2ML IJ SOLN
50.0000 ug | INTRAMUSCULAR | Status: DC | PRN
  Administered 2017-01-12: 100 ug via INTRAVENOUS

## 2017-01-12 MED ORDER — CHLORHEXIDINE GLUCONATE CLOTH 2 % EX PADS: 6.0000 | MEDICATED_PAD | Freq: Once | CUTANEOUS | Status: DC

## 2017-01-12 MED ORDER — SUGAMMADEX SODIUM 200 MG/2ML IV SOLN
INTRAVENOUS | Status: AC
  Filled 2017-01-12: qty 2

## 2017-01-12 MED ORDER — MIDAZOLAM HCL 2 MG/2ML IJ SOLN: 1.0000 mg | INTRAMUSCULAR | Status: DC | PRN

## 2017-01-12 MED ORDER — CIPROFLOXACIN IN D5W 400 MG/200ML IV SOLN
400.0000 mg | INTRAVENOUS | Status: AC
  Administered 2017-01-12: 400 mg via INTRAVENOUS

## 2017-01-12 MED ORDER — CIPROFLOXACIN IN D5W 400 MG/200ML IV SOLN
400.0000 mg | Freq: Two times a day (BID) | INTRAVENOUS | Status: DC
  Administered 2017-01-13: 400 mg via INTRAVENOUS
  Filled 2017-01-12: qty 200

## 2017-01-12 MED ORDER — ONDANSETRON HCL 4 MG/2ML IJ SOLN: 4.0000 mg | Freq: Four times a day (QID) | INTRAMUSCULAR | Status: DC | PRN

## 2017-01-12 MED ORDER — 0.9 % SODIUM CHLORIDE (POUR BTL) OPTIME
TOPICAL | Status: DC | PRN
  Administered 2017-01-12: 1000 mL

## 2017-01-12 MED ORDER — ONDANSETRON 4 MG PO TBDP
4.0000 mg | ORAL_TABLET | Freq: Four times a day (QID) | ORAL | Status: DC | PRN
Start: 2017-01-12 — End: 2017-01-13

## 2017-01-12 MED ORDER — FENTANYL CITRATE (PF) 100 MCG/2ML IJ SOLN
INTRAMUSCULAR | Status: AC
  Filled 2017-01-12: qty 2

## 2017-01-12 MED ORDER — PHENYLEPHRINE HCL 10 MG/ML IJ SOLN
INTRAMUSCULAR | Status: AC
  Filled 2017-01-12: qty 2

## 2017-01-12 MED ORDER — ENSURE PRE-SURGERY PO LIQD: 592.0000 mL | Freq: Once | ORAL | Status: DC

## 2017-01-12 MED ORDER — ENSURE PRE-SURGERY PO LIQD: 296.0000 mL | Freq: Once | ORAL | Status: DC

## 2017-01-12 MED ORDER — METHOCARBAMOL 500 MG PO TABS
500.0000 mg | ORAL_TABLET | Freq: Four times a day (QID) | ORAL | Status: DC | PRN
  Administered 2017-01-12 – 2017-01-13 (×2): 500 mg via ORAL
  Filled 2017-01-12 (×2): qty 1

## 2017-01-12 MED ORDER — GLYCOPYRROLATE 0.2 MG/ML IJ SOLN
INTRAMUSCULAR | Status: DC | PRN
  Administered 2017-01-12: 0.2 mg via INTRAVENOUS

## 2017-01-12 MED ORDER — SODIUM CHLORIDE 0.9 % IV SOLN
INTRAVENOUS | Status: DC
  Administered 2017-01-12: 18:00:00 via INTRAVENOUS

## 2017-01-12 MED ORDER — LACTATED RINGERS IV SOLN
INTRAVENOUS | Status: DC
  Administered 2017-01-12 (×4): via INTRAVENOUS

## 2017-01-12 MED ORDER — PHENYLEPHRINE HCL 10 MG/ML IJ SOLN
INTRAVENOUS | Status: DC | PRN
  Administered 2017-01-12: 25 ug/min via INTRAVENOUS

## 2017-01-12 MED ORDER — PROCHLORPERAZINE MALEATE 10 MG PO TABS: 10.0000 mg | ORAL_TABLET | Freq: Four times a day (QID) | ORAL | Status: DC | PRN

## 2017-01-12 MED ORDER — LIDOCAINE HCL (CARDIAC) 20 MG/ML IV SOLN
INTRAVENOUS | Status: DC | PRN
  Administered 2017-01-12: 30 mg via INTRAVENOUS

## 2017-01-12 MED ORDER — MIDAZOLAM HCL 2 MG/2ML IJ SOLN
INTRAMUSCULAR | Status: AC
  Filled 2017-01-12: qty 2

## 2017-01-12 MED ORDER — PROPOFOL 10 MG/ML IV BOLUS
INTRAVENOUS | Status: DC | PRN
  Administered 2017-01-12: 60 mg via INTRAVENOUS
  Administered 2017-01-12: 200 mg via INTRAVENOUS

## 2017-01-12 MED ORDER — GABAPENTIN 300 MG PO CAPS
ORAL_CAPSULE | ORAL | Status: AC
  Filled 2017-01-12: qty 1

## 2017-01-12 MED ORDER — MIDAZOLAM HCL 2 MG/2ML IJ SOLN
1.0000 mg | INTRAMUSCULAR | Status: DC | PRN
  Administered 2017-01-12: 2 mg via INTRAVENOUS

## 2017-01-12 MED ORDER — ROCURONIUM BROMIDE 100 MG/10ML IV SOLN
INTRAVENOUS | Status: DC | PRN
  Administered 2017-01-12: 10 mg via INTRAVENOUS
  Administered 2017-01-12: 50 mg via INTRAVENOUS

## 2017-01-12 MED ORDER — TECHNETIUM TC 99M SULFUR COLLOID FILTERED
1.0000 | Freq: Once | INTRAVENOUS | Status: AC | PRN
  Administered 2017-01-12: 1 via INTRADERMAL

## 2017-01-12 MED ORDER — DOXYCYCLINE HYCLATE 50 MG PO CAPS: 50.0000 mg | ORAL_CAPSULE | Freq: Two times a day (BID) | ORAL | 0 refills | Status: DC

## 2017-01-12 MED ORDER — DEXAMETHASONE SODIUM PHOSPHATE 4 MG/ML IJ SOLN
INTRAMUSCULAR | Status: DC | PRN
  Administered 2017-01-12 (×2): 10 mg via INTRAVENOUS

## 2017-01-12 MED ORDER — ESMOLOL HCL 100 MG/10ML IV SOLN
INTRAVENOUS | Status: AC
  Filled 2017-01-12: qty 10

## 2017-01-12 MED ORDER — OXYCODONE HCL 5 MG PO TABS: 5.0000 mg | ORAL_TABLET | ORAL | 0 refills | Status: DC | PRN

## 2017-01-12 MED ORDER — GABAPENTIN 300 MG PO CAPS
300.0000 mg | ORAL_CAPSULE | ORAL | Status: AC
  Administered 2017-01-12: 300 mg via ORAL

## 2017-01-12 MED ORDER — PHENYLEPHRINE HCL 10 MG/ML IJ SOLN
INTRAMUSCULAR | Status: AC
  Filled 2017-01-12: qty 1

## 2017-01-12 MED ORDER — SIMETHICONE 80 MG PO CHEW: 40.0000 mg | CHEWABLE_TABLET | Freq: Four times a day (QID) | ORAL | Status: DC | PRN

## 2017-01-12 MED ORDER — ACETAMINOPHEN 500 MG PO TABS
ORAL_TABLET | ORAL | Status: AC
  Filled 2017-01-12: qty 2

## 2017-01-12 MED ORDER — FENTANYL CITRATE (PF) 100 MCG/2ML IJ SOLN
25.0000 ug | INTRAMUSCULAR | Status: DC | PRN
Start: 2017-01-12 — End: 2017-01-13
  Administered 2017-01-12 (×3): 50 ug via INTRAVENOUS

## 2017-01-12 MED ORDER — OXYCODONE HCL 5 MG PO TABS
5.0000 mg | ORAL_TABLET | ORAL | Status: DC | PRN
  Administered 2017-01-12 – 2017-01-13 (×3): 5 mg via ORAL
  Filled 2017-01-12 (×3): qty 1

## 2017-01-12 MED ORDER — PHENYLEPHRINE 40 MCG/ML (10ML) SYRINGE FOR IV PUSH (FOR BLOOD PRESSURE SUPPORT)
PREFILLED_SYRINGE | INTRAVENOUS | Status: AC
  Filled 2017-01-12: qty 20

## 2017-01-12 MED ORDER — CIPROFLOXACIN IN D5W 400 MG/200ML IV SOLN
INTRAVENOUS | Status: AC
  Filled 2017-01-12: qty 200

## 2017-01-12 MED ORDER — CIPROFLOXACIN IN D5W 400 MG/200ML IV SOLN: 400.0000 mg | INTRAVENOUS | Status: DC

## 2017-01-12 MED ORDER — SODIUM CHLORIDE 0.9 % IV SOLN
INTRAVENOUS | Status: DC | PRN
  Administered 2017-01-12: 1000 mL

## 2017-01-12 MED ORDER — ACETAMINOPHEN 500 MG PO TABS: 1000.0000 mg | ORAL_TABLET | ORAL | Status: DC

## 2017-01-12 MED ORDER — SCOPOLAMINE 1 MG/3DAYS TD PT72: 1.0000 | MEDICATED_PATCH | Freq: Once | TRANSDERMAL | Status: DC | PRN

## 2017-01-12 MED ORDER — ACETAMINOPHEN 500 MG PO TABS
1000.0000 mg | ORAL_TABLET | ORAL | Status: AC
Start: 2017-01-12 — End: 2017-01-12
  Administered 2017-01-12: 1000 mg via ORAL

## 2017-01-12 MED ORDER — PROCHLORPERAZINE EDISYLATE 5 MG/ML IJ SOLN: 5.0000 mg | Freq: Four times a day (QID) | INTRAMUSCULAR | Status: DC | PRN

## 2017-01-12 MED ORDER — MORPHINE SULFATE (PF) 4 MG/ML IV SOLN
INTRAVENOUS | Status: AC
  Filled 2017-01-12: qty 1

## 2017-01-12 MED ORDER — PHENYLEPHRINE HCL 10 MG/ML IJ SOLN
INTRAMUSCULAR | Status: DC | PRN
Start: 2017-01-12 — End: 2017-01-12
  Administered 2017-01-12 (×2): 80 ug via INTRAVENOUS
  Administered 2017-01-12: 120 ug via INTRAVENOUS
  Administered 2017-01-12 (×2): 80 ug via INTRAVENOUS
  Administered 2017-01-12: 120 ug via INTRAVENOUS

## 2017-01-12 MED ORDER — LACTATED RINGERS IV SOLN: 500.0000 mL | INTRAVENOUS | Status: DC

## 2017-01-12 MED ORDER — CEFAZOLIN SODIUM-DEXTROSE 2-4 GM/100ML-% IV SOLN
INTRAVENOUS | Status: AC
  Filled 2017-01-12: qty 100

## 2017-01-12 MED ORDER — FENTANYL CITRATE (PF) 100 MCG/2ML IJ SOLN
50.0000 ug | INTRAMUSCULAR | Status: AC | PRN
  Administered 2017-01-12: 25 ug via INTRAVENOUS
  Administered 2017-01-12: 50 ug via INTRAVENOUS
  Administered 2017-01-12: 100 ug via INTRAVENOUS
  Administered 2017-01-12 (×2): 25 ug via INTRAVENOUS
  Administered 2017-01-12: 50 ug via INTRAVENOUS
  Administered 2017-01-12: 25 ug via INTRAVENOUS

## 2017-01-12 MED ORDER — BUPIVACAINE-EPINEPHRINE (PF) 0.25% -1:200000 IJ SOLN
INTRAMUSCULAR | Status: DC | PRN
  Administered 2017-01-12 (×2): 30 mL

## 2017-01-12 MED ORDER — ACETAMINOPHEN 500 MG PO TABS
1000.0000 mg | ORAL_TABLET | Freq: Four times a day (QID) | ORAL | Status: DC
  Administered 2017-01-12 – 2017-01-13 (×2): 1000 mg via ORAL
  Filled 2017-01-12 (×2): qty 2

## 2017-01-12 MED ORDER — ONDANSETRON HCL 4 MG/2ML IJ SOLN
INTRAMUSCULAR | Status: DC | PRN
  Administered 2017-01-12: 4 mg via INTRAVENOUS

## 2017-01-12 MED ORDER — PROPOFOL 10 MG/ML IV BOLUS
INTRAVENOUS | Status: AC
  Filled 2017-01-12: qty 20

## 2017-01-12 MED ORDER — MORPHINE SULFATE (PF) 2 MG/ML IV SOLN
2.0000 mg | INTRAVENOUS | Status: DC | PRN
  Administered 2017-01-12: 2 mg via INTRAVENOUS

## 2017-01-12 MED ORDER — METHOCARBAMOL 500 MG PO TABS: 500.0000 mg | ORAL_TABLET | Freq: Three times a day (TID) | ORAL | 0 refills | Status: DC | PRN

## 2017-01-12 MED ORDER — GABAPENTIN 300 MG PO CAPS: 300.0000 mg | ORAL_CAPSULE | ORAL | Status: DC

## 2017-01-12 SURGICAL SUPPLY — 99 items
ALLODERM 8X16 READY TO USE (Tissue) ×4 IMPLANT
ALLODERM RTU 8X16 (Tissue) ×8 IMPLANT
APPLIER CLIP 11 MED OPEN (CLIP) ×3
APPLIER CLIP 9.375 MED OPEN (MISCELLANEOUS) ×3
BAG DECANTER FOR FLEXI CONT (MISCELLANEOUS) ×3 IMPLANT
BENZOIN TINCTURE PRP APPL 2/3 (GAUZE/BANDAGES/DRESSINGS) IMPLANT
BINDER BREAST LRG (GAUZE/BANDAGES/DRESSINGS) IMPLANT
BINDER BREAST MEDIUM (GAUZE/BANDAGES/DRESSINGS) IMPLANT
BINDER BREAST XLRG (GAUZE/BANDAGES/DRESSINGS) ×3 IMPLANT
BINDER BREAST XXLRG (GAUZE/BANDAGES/DRESSINGS) IMPLANT
BIOPATCH RED 1 DISK 7.0 (GAUZE/BANDAGES/DRESSINGS) IMPLANT
BLADE CLIPPER SURG (BLADE) IMPLANT
BLADE HEX COATED 2.75 (ELECTRODE) ×3 IMPLANT
BLADE SURG 10 STRL SS (BLADE) ×3 IMPLANT
BLADE SURG 15 STRL LF DISP TIS (BLADE) ×2 IMPLANT
BLADE SURG 15 STRL SS (BLADE) ×1
BNDG GAUZE ELAST 4 BULKY (GAUZE/BANDAGES/DRESSINGS) ×6 IMPLANT
CANISTER SUCT 1200ML W/VALVE (MISCELLANEOUS) ×6 IMPLANT
CHLORAPREP W/TINT 26ML (MISCELLANEOUS) ×6 IMPLANT
CLIP APPLIE 11 MED OPEN (CLIP) ×2 IMPLANT
CLIP APPLIE 9.375 MED OPEN (MISCELLANEOUS) ×2 IMPLANT
CLIP VESOCCLUDE SM WIDE 6/CT (CLIP) IMPLANT
COVER BACK TABLE 60X90IN (DRAPES) ×3 IMPLANT
COVER MAYO STAND STRL (DRAPES) ×6 IMPLANT
COVER PROBE W GEL 5X96 (DRAPES) ×3 IMPLANT
DECANTER SPIKE VIAL GLASS SM (MISCELLANEOUS) IMPLANT
DERMABOND ADVANCED (GAUZE/BANDAGES/DRESSINGS) ×2
DERMABOND ADVANCED .7 DNX12 (GAUZE/BANDAGES/DRESSINGS) ×4 IMPLANT
DEVICE DISSECT PLASMABLAD 3.0S (MISCELLANEOUS) IMPLANT
DRAIN CHANNEL 15F RND FF W/TCR (WOUND CARE) ×9 IMPLANT
DRAIN CHANNEL 19F RND (DRAIN) ×9 IMPLANT
DRAPE LAPAROSCOPIC ABDOMINAL (DRAPES) IMPLANT
DRAPE TOP ARMCOVERS (MISCELLANEOUS) ×3 IMPLANT
DRAPE U-SHAPE 76X120 STRL (DRAPES) ×3 IMPLANT
DRAPE UTILITY XL STRL (DRAPES) ×6 IMPLANT
DRSG PAD ABDOMINAL 8X10 ST (GAUZE/BANDAGES/DRESSINGS) ×9 IMPLANT
DRSG TEGADERM 4X10 (GAUZE/BANDAGES/DRESSINGS) IMPLANT
DRSG TEGADERM 4X4.75 (GAUZE/BANDAGES/DRESSINGS) ×6 IMPLANT
ELECT BLADE 4.0 EZ CLEAN MEGAD (MISCELLANEOUS) ×3
ELECT COATED BLADE 2.86 ST (ELECTRODE) ×3 IMPLANT
ELECT REM PT RETURN 9FT ADLT (ELECTROSURGICAL) ×3
ELECTRODE BLDE 4.0 EZ CLN MEGD (MISCELLANEOUS) ×2 IMPLANT
ELECTRODE REM PT RTRN 9FT ADLT (ELECTROSURGICAL) ×2 IMPLANT
EVACUATOR SILICONE 100CC (DRAIN) ×15 IMPLANT
EXPANDER BREAST TISSUE 400CC (Breast) ×6 IMPLANT
GAUZE SPONGE 4X4 12PLY STRL LF (GAUZE/BANDAGES/DRESSINGS) IMPLANT
GLOVE BIO SURGEON STRL SZ 6 (GLOVE) ×9 IMPLANT
GLOVE BIO SURGEON STRL SZ7 (GLOVE) ×6 IMPLANT
GLOVE BIOGEL PI IND STRL 7.5 (GLOVE) ×2 IMPLANT
GLOVE BIOGEL PI INDICATOR 7.5 (GLOVE) ×1
GOWN STRL REUS W/ TWL LRG LVL3 (GOWN DISPOSABLE) ×10 IMPLANT
GOWN STRL REUS W/TWL LRG LVL3 (GOWN DISPOSABLE) ×5
HEMOSTAT ARISTA ABSORB 3G PWDR (MISCELLANEOUS) ×3 IMPLANT
ILLUMINATOR WAVEGUIDE N/F (MISCELLANEOUS) IMPLANT
IV NS 500ML (IV SOLUTION)
IV NS 500ML BAXH (IV SOLUTION) IMPLANT
KIT FILL SYSTEM UNIVERSAL (SET/KITS/TRAYS/PACK) ×3 IMPLANT
LIGHT WAVEGUIDE WIDE FLAT (MISCELLANEOUS) ×3 IMPLANT
MARKER SKIN DUAL TIP RULER LAB (MISCELLANEOUS) IMPLANT
NDL SAFETY ECLIPSE 18X1.5 (NEEDLE) IMPLANT
NEEDLE HYPO 18GX1.5 SHARP (NEEDLE)
NEEDLE HYPO 25X1 1.5 SAFETY (NEEDLE) ×3 IMPLANT
NS IRRIG 1000ML POUR BTL (IV SOLUTION) ×6 IMPLANT
PACK BASIN DAY SURGERY FS (CUSTOM PROCEDURE TRAY) ×3 IMPLANT
PENCIL BUTTON HOLSTER BLD 10FT (ELECTRODE) ×3 IMPLANT
PIN SAFETY STERILE (MISCELLANEOUS) ×12 IMPLANT
PLASMABLADE 3.0S (MISCELLANEOUS)
PUNCH BIOPSY DERMAL 4MM (MISCELLANEOUS) ×3 IMPLANT
SHEET MEDIUM DRAPE 40X70 STRL (DRAPES) ×3 IMPLANT
SLEEVE SCD COMPRESS KNEE MED (MISCELLANEOUS) ×6 IMPLANT
SPONGE LAP 18X18 X RAY DECT (DISPOSABLE) ×21 IMPLANT
SPONGE LAP 4X18 X RAY DECT (DISPOSABLE) IMPLANT
STAPLER VISISTAT 35W (STAPLE) ×3 IMPLANT
STOCKINETTE IMPERVIOUS LG (DRAPES) IMPLANT
STRIP CLOSURE SKIN 1/2X4 (GAUZE/BANDAGES/DRESSINGS) IMPLANT
SUT CHROMIC 4 0 PS 2 18 (SUTURE) ×18 IMPLANT
SUT ETHILON 2 0 FS 18 (SUTURE) ×9 IMPLANT
SUT MNCRL AB 4-0 PS2 18 (SUTURE) ×6 IMPLANT
SUT PDS AB 2-0 CT2 27 (SUTURE) IMPLANT
SUT SILK 2 0 SH (SUTURE) IMPLANT
SUT VIC AB 0 CT1 27 (SUTURE) ×4
SUT VIC AB 0 CT1 27XBRD ANBCTR (SUTURE) ×8 IMPLANT
SUT VIC AB 3-0 54X BRD REEL (SUTURE) IMPLANT
SUT VIC AB 3-0 BRD 54 (SUTURE)
SUT VIC AB 3-0 SH 27 (SUTURE) ×2
SUT VIC AB 3-0 SH 27X BRD (SUTURE) ×4 IMPLANT
SUT VICRYL 0 CT-2 (SUTURE) IMPLANT
SUT VICRYL 3-0 CR8 SH (SUTURE) ×3 IMPLANT
SUT VICRYL 4-0 PS2 18IN ABS (SUTURE) ×6 IMPLANT
SUT VLOC 180 0 24IN GS25 (SUTURE) ×6 IMPLANT
SYR BULB IRRIGATION 50ML (SYRINGE) ×3 IMPLANT
SYR CONTROL 10ML LL (SYRINGE) IMPLANT
TAPE MEASURE VINYL STERILE (MISCELLANEOUS) IMPLANT
TISSUE ALLDRM RTU 8X16 (Tissue) ×8 IMPLANT
TOWEL OR 17X24 6PK STRL BLUE (TOWEL DISPOSABLE) ×9 IMPLANT
TOWEL OR NON WOVEN STRL DISP B (DISPOSABLE) ×3 IMPLANT
TUBE CONNECTING 20X1/4 (TUBING) ×3 IMPLANT
UNDERPAD 30X30 (UNDERPADS AND DIAPERS) IMPLANT
YANKAUER SUCT BULB TIP NO VENT (SUCTIONS) ×3 IMPLANT

## 2017-01-12 NOTE — Anesthesia Preprocedure Evaluation (Signed)
Anesthesia Evaluation  Patient identified by MRN, date of birth, ID band Patient awake    Reviewed: Allergy & Precautions, NPO status , Patient's Chart, lab work & pertinent test results  Airway Mallampati: II  TM Distance: >3 FB Neck ROM: Full    Dental  (+) Dental Advisory Given   Pulmonary neg pulmonary ROS,    breath sounds clear to auscultation       Cardiovascular negative cardio ROS   Rhythm:Regular Rate:Normal     Neuro/Psych Anxiety negative neurological ROS     GI/Hepatic negative GI ROS, Neg liver ROS,   Endo/Other  negative endocrine ROS  Renal/GU negative Renal ROS     Musculoskeletal   Abdominal   Peds  Hematology negative hematology ROS (+)   Anesthesia Other Findings   Reproductive/Obstetrics                             Anesthesia Physical Anesthesia Plan  ASA: II  Anesthesia Plan: General   Post-op Pain Management:  Regional for Post-op pain   Induction: Intravenous  PONV Risk Score and Plan: 3 and Ondansetron, Dexamethasone, Midazolam and Treatment may vary due to age or medical condition  Airway Management Planned: Oral ETT  Additional Equipment:   Intra-op Plan:   Post-operative Plan: Extubation in OR  Informed Consent: I have reviewed the patients History and Physical, chart, labs and discussed the procedure including the risks, benefits and alternatives for the proposed anesthesia with the patient or authorized representative who has indicated his/her understanding and acceptance.   Dental advisory given  Plan Discussed with: CRNA  Anesthesia Plan Comments:         Anesthesia Quick Evaluation

## 2017-01-12 NOTE — Interval H&P Note (Signed)
History and Physical Interval Note:  01/12/2017 12:26 PM  [name]  has presented today for surgery, with the diagnosis of BILATERAL BREAST CANCER  The various methods of treatment have been discussed with the patient and family. After consideration of risks, benefits and other options for treatment, the patient has consented to  Procedure(s): RIGHT MODIFIED RADICAL MASTECTOMY, LEFT TOTAL MASTECTOMY WITH LEFT SENTINEL LYMPH NODE BIOPSY (Bilateral) BREAST RECONSTRUCTION WITH PLACEMENT OF TISSUE EXPANDER AND ALLODERM (Bilateral) as a surgical intervention .  The patient's history has been reviewed, patient examined, no change in status, stable for surgery.  I have reviewed the patient's chart and labs.  Questions were answered to the patient's satisfaction.     Amanda Williams

## 2017-01-12 NOTE — Progress Notes (Signed)
Emotional support during breast injections °

## 2017-01-12 NOTE — Anesthesia Procedure Notes (Addendum)
Procedure Name: Intubation Date/Time: 01/12/2017 1:10 PM Performed by: BLOCKER, TIMOTHY D Pre-anesthesia Checklist: Patient identified, Emergency Drugs available, Suction available, Patient being monitored and Timeout performed Patient Re-evaluated:Patient Re-evaluated prior to induction Oxygen Delivery Method: Circle system utilized Preoxygenation: Pre-oxygenation with 100% oxygen Induction Type: IV induction Ventilation: Mask ventilation without difficulty Laryngoscope Size: Mac, 3 and Glidescope Grade View: Grade III Tube type: Oral Tube size: 7.0 mm Number of attempts: 3 Airway Equipment and Method: Stylet,  Oral airway and Video-laryngoscopy Placement Confirmation: ETT inserted through vocal cords under direct vision,  positive ETCO2 and breath sounds checked- equal and bilateral Secured at: 21 cm Tube secured with: Tape Dental Injury: Teeth and Oropharynx as per pre-operative assessment  Difficulty Due To: Difficulty was unanticipated Future Recommendations: Recommend- induction with short-acting agent, and alternative techniques readily available Comments: Smooth IV induction, EASY MASK airway, very limited oral opening to instrument the airway, airway anterior, One attempt unable to secure airway with MAC3 unable to visualize vocal cords, second attempt with glide scope partial vocal cords visualized and unable to pass tube, third attempt (second with glide scope) Dr Ola Spurr vocal cords partial visualized able to place 7.0 ETT . +ETCO2, + BBS, dentition unchanged

## 2017-01-12 NOTE — Op Note (Signed)
Operative Note   DATE OF OPERATION: 10.23.18  LOCATION: [facility]-outpatient  SURGICAL DIVISION: Plastic Surgery  PREOPERATIVE DIAGNOSES:  1. Right breast ca multicentric (ILC) ER+ 2. Left breast ca multicentric (IDC)  POSTOPERATIVE DIAGNOSES:  same  PROCEDURE:  1. Bilateral breast reconstruction with tissue expanders 2. Acellular dermis for breast reconstruction (Alloderm) 500 cm2  SURGEON: Irene Limbo MD MBA  ASSISTANT: none  ANESTHESIA:  General.   EBL: 150 ml for entire procedure  COMPLICATIONS: None immediate.   INDICATIONS FOR PROCEDURE:  The patient, Amanda Williams, is a 51 y.o. female born on March 09, 1966, is here for immediate prepectoral expander based reconstruction following right MRM left skin sparing mastectomy with SLN.   FINDINGS: Natrelle 133FV-12-T 400 ml tissue expander placed bilateral, initial fill 240 ml air right, initial fill 300 ml air left. RIGHT SN [id number] LEFT SN [id number]  DESCRIPTION OF PROCEDURE:  Following induction of general anesthesia, Foley catheter placed. The patient's operative site was prepped and draped in a sterile fashion. A time out was performed and all information was confirmed to be correct. Following completion of mastectomies, reconstruction began on left side.  The cavity was irrigated with solution containing Ancef, gentamicin, and bacitracin. Hemostasis was ensured. Laterally the mastectomy flap over posterior axillary line was advanced anteriorly and the subcutaneous tissue and superficial fascia was secured to pectoralis muscle and serratus muscle with 0-vicryl. A 19 Fr drain was placed in subcutaneous position laterally and a 15 Fr drain placed along inframammary fold. Each secured to skin with 2-0 nylon. Cavity irrigated with Betadine. The tissue expanders were prepared on back table prior in insertion. The expander was filled with air to 300 ml. Perforated acellular dermis was draped over anterior surface expander. The  ADM was then secured to itself over posterior surface of expander with 4-0 chromic suture. Redundant folds acellular dermis excised so that the ADM lied flat without folds over air filled expander.The expander was secured to fascia over lateral sternal borderwith a 0 vicryl. Thelateral tab was also secured to pectoralis muscle with 0-vicryl. The ADM was secured to pectoralis muscle and chest wall along inferior border at inframammary fold with 0 V-lock suture. Skin closure completedwith 3-0 vicryl in fascial layer and 4-0 vicryl in dermis. Skin closure completed with 4-0 monocryl subcuticular and tissue adhesive.  I then directed my attention to right chest where similar irrigation and drain placement completed. Laterally the mastectomy flap over posterior axillary line was advanced anteriorly and the subcutaneous tissue and superficial fascia was secured to pectoralis muscle and serratus muscle with 0-vicryl. The axillary drain was thus isolated from remainder breast cavity. The prepared expander with ADM secured over anterior surface was placed in right chest and tabs secured to chest wall and pectoralis muscle with 0- vicryl suture. The acellular dermis at inframammary fold was secured to chest wall with 0 V-lock suture. Skin closure completedwith 3-0 vicryl in fascial layer and 4-0 vicryl in dermis. Skin closure completed with 4-0 monocryl subcuticular and tissue adhesive. Tegaderm dressings applied followed by breast binder.  The patient was allowed to wake from anesthesia, extubated and taken to the recovery room in satisfactory condition.   SPECIMENS: none  DRAINS: 15 and 19 Fr JP in right and left breast reconstruction  Irene Limbo, MD [facility] Plastic & Reconstructive Surgery [phone], pin [phone]

## 2017-01-12 NOTE — Interval H&P Note (Signed)
History and Physical Interval Note:  01/12/2017 12:19 PM  Amanda Williams  has presented today for surgery, with the diagnosis of BILATERAL BREAST CANCER  The various methods of treatment have been discussed with the patient and family. After consideration of risks, benefits and other options for treatment, the patient has consented to  Procedure(s): RIGHT MODIFIED RADICAL MASTECTOMY, LEFT TOTAL MASTECTOMY WITH LEFT SENTINEL LYMPH NODE BIOPSY (Bilateral) BREAST RECONSTRUCTION WITH PLACEMENT OF TISSUE EXPANDER AND ALLODERM (Bilateral) as a surgical intervention .  The patient's history has been reviewed, patient examined, no change in status, stable for surgery.  I have reviewed the patient's chart and labs.  Questions were answered to the patient's satisfaction.     Apolo Cutshaw

## 2017-01-12 NOTE — H&P (Signed)
51 yof I know from prior benign breast biopsy as well as taking care of her gm Lanier Ensign for breast cancer. that is only family history. she was recently seen in [place] and I am seeing today. she is healthy. she noted a right breast mass about a month ago. she had mm in july that was normal. she then underwent evaluation that showed D density breast tissue. there was architectural distortion in the right breast central to the nipple. there is some skin thickening and nipple retraction also. the left breast was normal. US showed 4 masses in the right breast measuring 3.1 cm upper outer, 4 cm at 8 oclock, 2.5 cm at 6 oclock and 2.2 cm lower inner breast. there were also 2 abnormal right axillary nodes seen. US guided biopsy of three breast masses and a node were then performed. all the breast biopsies and the node showed invasive lobular carcinoma. all are er/pr pos, her 2 negative with low Ki's. she then proceeded to get a breast mri that showed extremely dense breasts. the right breast has extensive enhancement in all four quadrants with extension to the skin. it does not appear to involve chest wall the left breast has patchy nodular enhancement with 3 spiculated masses all measuring 1 cm in size. there are abnormal right sided nodes, the left and im nodes appear normal. she then underwent left sided biopsies. these are both invasive ductal carcinoma with dcis. the first is er/pr pos, her 2 negative and low Ki. the second is a triple positive with a low Ki. she has seen plastic surgery already. she is on tamoxifen right now in anticipation of eventual surgery. she has had genetics drawn. she works as a cpa   Past Surgical History Rolm Bookbinder, MD; 01/07/2017 1:27 PM) Breast Biopsy  Bilateral. Sentinel Lymph Node Biopsy   Allergies Malachy Moan, RMA; 01/07/2017 10:27 AM) Penicillins  Hives. Sulfa Antibiotics  Hives.  Medication History Malachy Moan, [place]; 01/07/2017  10:27 AM) Tamoxifen Citrate (20MG  Tablet, Oral) Active. Sertraline HCl (100MG  Tablet, Oral) Active. Medications Reconciled  Social History Rolm Bookbinder, MD; 01/07/2017 1:28 PM) Alcohol use  Occasional alcohol use. Caffeine use  Carbonated beverages, Coffee, Tea. No drug use  Tobacco use  Never smoker.  Family History Rolm Bookbinder, MD; 01/07/2017 1:28 PM) Heart disease in female family member before age 32  Hypertension  Mother.  Review of Systems Sunday Spillers [place] RN; 12/23/2016 7:32 AM) General Not Present- Appetite Loss, Chills, Fatigue, Fever, Night Sweats, Weight Gain and Weight Loss. Skin Not Present- Change in Wart/Mole, Dryness, Hives, Jaundice, New Lesions, Non-Healing Wounds, Rash and Ulcer. HEENT Present- Wears glasses/contact lenses. Not Present- Earache, Hearing Loss, Hoarseness, Nose Bleed, Oral Ulcers, Ringing in the Ears, Seasonal Allergies, Sinus Pain, Sore Throat, Visual Disturbances and Yellow Eyes. Respiratory Not Present- Bloody sputum, Chronic Cough, Difficulty Breathing, Snoring and Wheezing. Breast Present- Breast Mass and Breast Pain. Not Present- Nipple Discharge and Skin Changes. Cardiovascular Not Present- Chest Pain, Difficulty Breathing Lying Down, Leg Cramps, Palpitations, Rapid Heart Rate, Shortness of Breath and Swelling of Extremities. Gastrointestinal Not Present- Abdominal Pain, Bloating, Bloody Stool, Change in Bowel Habits, Chronic diarrhea, Constipation, Difficulty Swallowing, Excessive gas, Gets full quickly at meals, Hemorrhoids, Indigestion, Nausea, Rectal Pain and Vomiting. Female Genitourinary Not Present- Frequency, Nocturia, Painful Urination, Pelvic Pain and Urgency. Musculoskeletal Not Present- Back Pain, Joint Pain, Joint Stiffness, Muscle Pain, Muscle Weakness and Swelling of Extremities. Neurological Not Present- Decreased Memory, Fainting, Headaches, Numbness, Seizures, Tingling, Tremor, Trouble walking and  Weakness. Psychiatric Present- Anxiety. Not Present- Bipolar, Change in Sleep Pattern, Depression, Fearful and Frequent crying. Endocrine Not Present- Cold Intolerance, Excessive Hunger, Hair Changes, Heat Intolerance, Hot flashes and New Diabetes. Hematology Not Present- Blood Thinners, Easy Bruising, Excessive bleeding, Gland problems, HIV and Persistent Infections.  Vitals Malachy Moan RMA; 01/07/2017 10:28 AM) 01/07/2017 10:27 AM Weight: 149.2 lb Height: 66in Body Surface Area: 1.77 m Body Mass Index: 24.08 kg/m  Temp.: 63F  Pulse: 74 (Regular)  BP: 100/80 (Sitting, Left Arm, Standard) Physical Exam Rolm Bookbinder MD; 01/07/2017 1:14 PM) General Mental Status-Alert. Orientation-Oriented X3. Head and Neck Trachea-midline. Thyroid Gland Characteristics - normal size and consistency. Eye Sclera/Conjunctiva - Bilateral-No scleral icterus. Chest and Lung Exam Chest and lung exam reveals -quiet, even and easy respiratory effort with no use of accessory muscles and on auscultation, normal breath sounds, no adventitious sounds and normal vocal resonance. Breast Note: right nipple partially retracted, left normal appearing left breast without palpable mass right breast feels like most of breast is tumor, no skin involvement, it is mobile no left axillary lad one palpable right axillary node Cardiovascular Cardiovascular examination reveals -normal heart sounds, regular rate and rhythm with no murmurs and no digital clubbing, cyanosis, edema, increased warmth or tenderness. Lymphatic Head & Neck General Head & Neck Lymphatics: Bilateral - Description - Normal. Note: no [place] adenopathy   Assessment & Plan Rolm Bookbinder MD; 01/07/2017 1:27 PM) MALIGNANT NEOPLASM OF OVERLAPPING SITES OF BOTH BREASTS IN FEMALE, ESTROGEN RECEPTOR POSITIVE (C50.811) Story: Right modified radical mastectomy, left total mastectomy, left axillary sentinel node  biopsy We discussed her breast cancer at length today. we discussed all treatment options. I think surgery will be first treatment for her. her her 2 positivity and need for antiher2 therapy and chemotherapy will be evaluated with final pathology and possibly molecular testing. I think she needs a right mrm. this is not clinically inflammatory cancer. I think ssm with removal nac is reasonable. I think fine to have imemediate expander reconstruction. I also recommended an axillary node dissection to her. we discussed drain and lymphedema risk postoperatively as well. On left side I do think she needs total mastectomy with removal nac. we can do a sentinel node on this side and we discussed this in detail. will proceed with prepec expander on this side also. will work with Dr Iran Planas to schedule soon. will need to be off tamoxifen a week before

## 2017-01-12 NOTE — Anesthesia Procedure Notes (Signed)
Anesthesia Regional Block: Pectoralis block   Pre-Anesthetic Checklist: ,, timeout performed, Correct Patient, Correct Site, Correct Laterality, Correct Procedure, Correct Position, site marked, Risks and benefits discussed,  Surgical consent,  Pre-op evaluation,  At surgeon's request and post-op pain management  Laterality: Left  Prep: chloraprep       Needles:  Injection technique: Single-shot  Needle Type: Echogenic Needle     Needle Length: 9cm  Needle Gauge: 21     Additional Needles:   Procedures:,,,, ultrasound used (permanent image in chart),,,,  Narrative:  Start time: 01/12/2017 12:38 PM End time: 01/12/2017 12:44 PM Injection made incrementally with aspirations every 5 mL.  Performed by: Personally  Anesthesiologist: Suzette Battiest

## 2017-01-12 NOTE — Progress Notes (Signed)
Assisted Dr. Rodman Comp with right, and left, ultrasound guided, pectoralis blocks. Side rails up, monitors on throughout procedure. See vital signs in flow sheet. Tolerated Procedure well.

## 2017-01-12 NOTE — Anesthesia Procedure Notes (Signed)
Anesthesia Regional Block: Pectoralis block   Pre-Anesthetic Checklist: ,, timeout performed, Correct Patient, Correct Site, Correct Laterality, Correct Procedure, Correct Position, site marked, Risks and benefits discussed,  Surgical consent,  Pre-op evaluation,  At surgeon's request and post-op pain management  Laterality: Right  Prep: chloraprep       Needles:  Injection technique: Single-shot  Needle Type: Echogenic Needle     Needle Length: 9cm  Needle Gauge: 21     Additional Needles:   Procedures:,,,, ultrasound used (permanent image in chart),,,,  Narrative:  Start time: 01/12/2017 12:30 PM End time: 01/12/2017 12:37 PM Injection made incrementally with aspirations every 5 mL.  Performed by: Personally  Anesthesiologist: Suzette Battiest

## 2017-01-12 NOTE — Op Note (Signed)
Preoperative diagnosis: Clinical stage I left breast cancer, clinical stage II right breast cancer Postoperative diagnosis: Same as above Procedure: 1.  Right modified radical mastectomy 2.  Left skin sparing mastectomy 3.  Left axillary sentinel lymph node biopsy Surgeon: Dr. Serita Grammes Anesthesia: General with bilateral pectoral blocks Estimated blood loss: 100 cc Specimens: 1.  Left total mastectomy marked short superior, long lateral 2.  Left axillary sentinel lymph nodes with highest count of 1868 3.  Right modified radical mastectomy with short marking superior and long stitch marking lateral, additional axillary contents Complications: None Drains: 19 French Blake drain to right axilla, remainder of drains will be placed by plastic surgery Disposition Case turned over to plastic surgery for completion  Indications: This is a 51 year old female who presented with a right breast mass that she had palpated.  She had a mammogram in July that was normal.  She underwent evaluation and ended up having at least 2 abnormal right axillary lymph nodes and at least 3 right breast masses measuring at most 4 cm.  This underwent biopsy and they all were invasive lobular carcinoma that is hormone receptor positive.  These were HER-2 negative. She then had an MRI that showed that she had multiple left breast masses.  All of these measured about 1 cm size.  There were no abnormal lymph nodes.  Two were biopsied and were invasive ductal carcinoma with DCIS.  One of these is HER-2 positive.  In discussion with oncology we have elected to wait for final pathology to determine chemotherapy and anti-HER-2 therapy.  We discussed all of the options and she very much would like to proceed with mastectomy and an attempt at expander reconstruction immediately.  I recommended a sentinel lymph node on the left and a axillary lymph node dissection on the right.  Procedure: After informed consent was obtained the  patient was taken to the operating room.  She underwent bilateral pectoral blocks.  She was given antibiotics.  SCDs were in place.  She was then placed under general anesthesia.  Her airway was very difficult.  She was then prepped and draped in the standard sterile surgical fashion.  A Foley catheter was placed.  A surgical timeout was then performed.  I did a left mastectomy first.  I made elliptical incision encompassing the nipple and the areola in an attempt to save as much skin as possible.  I then created flaps with cautery to the clavicle, parasternal area, inframammary fold, and the latissimus laterally.  I then removed the breast from the pectoralis muscle to include the pectoralis fascia.  I then passed this off the table as a specimen.  I then entered into the axilla.  I used the neoprobe to identify what appeared to be a small bundle of sentinel lymph nodes.  I removed these with the highest count as listed above.  There was no background radioactivity.  I then obtained hemostasis.  I packed this with sponges and went to the other side.  I made a fairly large elliptical incision encompassing the tumor as well as the skin that was adherent to it in several places.  This was seen clinically as well as with the MRI.  This is not an inflammatory cancer.  I then created flaps to the clavicle, parasternal area, inframammary fold, and latissimus laterally.  I then removed the breast from the pectoralis muscle including a small portion of the pectoralis muscle where the tumor was very close.  I then entered into  the axilla.  I identified the long thoracic nerve, thoracodorsal bundle, and axillary vein.  These were all preserved.  I swept all the nodal tissue caudad from there.  There was no more palpable nodal tissue present in this area of the axilla.  There were no other palpable nodes at level 1 in the axilla either.  I then passed this off the table as a specimen.  I obtained hemostasis.  I then placed  a 44 [place] Blake drain to lie in the axilla and secured this with a 2-0 nylon.  The case was then turned over to plastic surgery.

## 2017-01-12 NOTE — Transfer of Care (Signed)
Immediate Anesthesia Transfer of Care Note  Patient: Amanda Williams  Procedure(s) Performed: RIGHT MODIFIED RADICAL MASTECTOMY, LEFT TOTAL MASTECTOMY WITH LEFT SENTINEL LYMPH NODE BIOPSY (Bilateral Breast) BREAST RECONSTRUCTION WITH PLACEMENT OF TISSUE EXPANDER AND ALLODERM (Bilateral Chest)  Patient Location: PACU  Anesthesia Type:GA combined with regional for post-op pain  Level of Consciousness: awake and patient cooperative  Airway & Oxygen Therapy: Patient Spontanous Breathing and Patient connected to face mask oxygen  Post-op Assessment: Report given to RN and Post -op Vital signs reviewed and stable  Post vital signs: Reviewed and stable  Last Vitals:  Vitals:   01/12/17 1257 01/12/17 1258  BP:    Pulse: 97 99  Resp: 18 19  Temp:    SpO2: 100% 100%    Last Pain:  Vitals:   01/12/17 1151  TempSrc: Oral         Complications: No apparent anesthesia complications

## 2017-01-13 DIAGNOSIS — C50911 Malignant neoplasm of unspecified site of right female breast: Secondary | ICD-10-CM | POA: Diagnosis not present

## 2017-01-13 NOTE — Progress Notes (Signed)
Patient ID: Amanda Williams, female   DOB: July 16, 1965, 51 y.o.   MRN: [id number] Doing well, pain controlled, drains as expected dc home

## 2017-01-14 ENCOUNTER — Other Ambulatory Visit: Payer: 59

## 2017-01-14 ENCOUNTER — Encounter (HOSPITAL_BASED_OUTPATIENT_CLINIC_OR_DEPARTMENT_OTHER): Payer: Self-pay | Admitting: General Surgery

## 2017-01-14 NOTE — Anesthesia Postprocedure Evaluation (Signed)
Anesthesia Post Note  Patient: Amanda Williams  Procedure(s) Performed: RIGHT MODIFIED RADICAL MASTECTOMY, LEFT TOTAL MASTECTOMY WITH LEFT SENTINEL LYMPH NODE BIOPSY (Bilateral Breast) BREAST RECONSTRUCTION WITH PLACEMENT OF TISSUE EXPANDER AND ALLODERM (Bilateral Chest)     Patient location during evaluation: PACU Anesthesia Type: General Level of consciousness: awake and alert Pain management: pain level controlled Vital Signs Assessment: post-procedure vital signs reviewed and stable Respiratory status: spontaneous breathing, nonlabored ventilation, respiratory function stable and patient connected to nasal cannula oxygen Cardiovascular status: blood pressure returned to baseline and stable Postop Assessment: no apparent nausea or vomiting Anesthetic complications: no    Last Vitals:  Vitals:   01/13/17 0730 01/13/17 0745  BP:  105/65  Pulse:  77  Resp:  18  Temp:  36.6 C  SpO2: 98% 97%    Last Pain:  Vitals:   01/13/17 0730  TempSrc:   PainSc: 4                  Tiajuana Amass

## 2017-01-15 ENCOUNTER — Telehealth: Payer: Self-pay | Admitting: Hematology and Oncology

## 2017-01-15 NOTE — Telephone Encounter (Signed)
Left message for patient regarding appt that was added per 10/26 sch msg

## 2017-01-19 ENCOUNTER — Other Ambulatory Visit: Payer: Self-pay | Admitting: Hematology and Oncology

## 2017-01-21 ENCOUNTER — Ambulatory Visit (HOSPITAL_BASED_OUTPATIENT_CLINIC_OR_DEPARTMENT_OTHER): Payer: 59 | Admitting: Hematology and Oncology

## 2017-01-21 VITALS — BP 102/61 | HR 82 | Temp 97.9°F | Resp 20 | Ht 66.0 in | Wt 147.4 lb

## 2017-01-21 DIAGNOSIS — C50812 Malignant neoplasm of overlapping 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 breast: Secondary | ICD-10-CM

## 2017-01-21 DIAGNOSIS — Z170 Estrogen receptor positive status [ER+]: Secondary | ICD-10-CM | POA: Diagnosis not present

## 2017-01-21 DIAGNOSIS — C50811 Malignant neoplasm of overlapping sites of right female breast: Secondary | ICD-10-CM | POA: Diagnosis not present

## 2017-01-21 DIAGNOSIS — C773 Secondary and unspecified malignant neoplasm of axilla and upper limb lymph nodes: Secondary | ICD-10-CM | POA: Diagnosis not present

## 2017-01-21 NOTE — Assessment & Plan Note (Addendum)
01/12/2017: Left mastectomy: IDC with DCIS, 2 tumors 3.6 cm ER 90%, PR 95%, Ki-67 5%, HER-2 negative ratio 1.32) and 1.2 cm (ER 95% PR 95% positive Her 2 Positive ratio 3.2, Ki-67 5%) T2 N0 stage Ib Right mastectomy: Invasive and in situ lobular carcinoma involves the dermis of the skin of the nipple, margins negative, 3/3 lymph nodes positive, grade 1, EF 75%, PR 95%, HER-2 negative ratio 1.19, Ki-67 10%, T3N1A stage IIa  Pathology counseling: I discussed the final pathology report of the patient provided  a copy of this report. I discussed the margins as well as lymph node surgeries. We also discussed the final staging along with previously performed ER/PR and HER-2/neu testing.  Recommendation: 1.  Based on the HER-2 positivity, patient will need adjuvant systemic therapy with TCH. 2. I discussed the case with Dr. Donne Hazel and there is no further lymph nodes to be removed in the right axilla. 3.  Adjuvant radiation therapy 4.  Followed by adjuvant antiestrogen therapy Patient may be eligible for [facility] clinical trial

## 2017-01-21 NOTE — Progress Notes (Signed)
Patient Care Team: Panosh, Standley Brooking, MD as PCP - General Delsa Bern, MD (Obstetrics and Gynecology) Excell Seltzer, MD as Consulting Physician (General Surgery) Nicholas Lose, MD as Consulting Physician (Hematology and Oncology) Kyung Rudd, MD as Consulting Physician (Radiation Oncology)  DIAGNOSIS:  Encounter Diagnoses  Name Primary?  . Malignant neoplasm of overlapping sites of right breast in female, estrogen receptor positive ([place]) Yes  . Malignant neoplasm of overlapping 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 breast, unspecified estrogen receptor status ([place])     SUMMARY OF ONCOLOGIC HISTORY:   Malignant neoplasm of overlapping 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 breast ([place])   01/12/2017 Surgery    Left mastectomy: IDC with DCIS, 2 tumors 3.6 cm ER 90%, PR 95%, Ki-67 5%, HER-2 negative ratio 1.32) and 1.2 cm (ER 95% PR 95% positive Her 2 Positive ratio 3.2, Ki-67 5%) T2 N0 stage Ib       Malignant neoplasm of overlapping sites of right breast in female, estrogen receptor positive ([place])   12/21/2016 Initial Diagnosis    Palpable lumps in the right breast with nipple inversion: Mammogram revealed skin thickening and distortion, ultrasound revealed 4 masses 3.1 cm at 11:00, 2.5 cm at 6:00, 4 cm at 8:00, 2.2 cm at 5:00 with 2 abnormal axillary lymph nodes: MRI revealed 7 cm abnormality on right breast, in addition 3.6 cm abnormality in the left breast and 2 additional masses 1 cm each; right breast: T4 N1 stage III B clinical stage; left breast: T2 N0 stage IB clinical stage      12/21/2016 Pathology Results    Right breast: Grade 1 Invasive lobular cancer, lymph node also positive, ER 70%, PR 90%, Ki-67 10%, HER-2 negative ratio 1.09 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  The Multi-Cancer Panel was ordered. The Multi-Cancer  Panel offered by [facility] includes sequencing and/or deletion duplication testing of the following 83 genes: ALK, APC, ATM, AXIN2,BAP1,  BARD1, BLM, BMPR1A, BRCA1, BRCA2, BRIP1, CASR, CDC73, CDH1, CDK4, CDKN1B, CDKN1C, CDKN2A (p14ARF), CDKN2A (p16INK4a), CEBPA, CHEK2, CTNNA1, DICER1, DIS3L2, EGFR (c.2369C>T, p.Thr790Met variant only), EPCAM (Deletion/duplication testing only), FH, FLCN, GATA2, GPC3, GREM1 (Promoter region deletion/duplication testing only), HOXB13 (c.251G>A, p.Gly84Glu), HRAS, KIT, MAX, MEN1, MET, MITF (c.952G>A, p.Glu318Lys variant only), MLH1, MSH2, MSH3, MSH6, MUTYH, NBN, NF1, NF2, NTHL1, PALB2, PDGFRA, PHOX2B, PMS2, POLD1, POLE, POT1, PRKAR1A, PTCH1, PTEN, RAD50, RAD51C, RAD51D, RB1, RECQL4, RET, RUNX1, SDHAF2, SDHA (sequence changes only), SDHB, SDHC, SDHD, SMAD4, SMARCA4, SMARCB1, SMARCE1, STK11, SUFU, TERC, TERT, TMEM127, TP53, TSC1, TSC2, VHL, WRN and WT1.   Results: Negative, no pathogenic variants identified.  The date of this test report is 01/04/2017.        01/12/2017 Surgery    Right mastectomy: Invasive and in situ lobular carcinoma involves the dermis of the skin of the nipple, margins negative, 3/3 lymph nodes positive, grade 1, EF 75%, PR 95%, HER-2 negative ratio 1.19, Ki-67 10%, T3N1A stage IIa       CHIEF COMPLIANT:   INTERVAL HISTORY: Amanda Williams is a  REVIEW OF SYSTEMS:   Constitutional: Denies fevers, chills or abnormal weight loss Eyes: Denies blurriness of vision Ears, nose, mouth, throat, and face: Denies mucositis or sore throat Respiratory: Denies cough, dyspnea or wheezes Cardiovascular: Denies palpitation, chest discomfort Gastrointestinal:  Denies nausea, heartburn or change in bowel habits  Skin: Denies abnormal skin rashes Lymphatics: Denies new lymphadenopathy or easy bruising Neurological:Denies numbness, tingling or new weaknesses Behavioral/Psych: Mood is stable, no new changes  Extremities: No lower extremity edema  All other systems  were reviewed with the patient and are negative.  I have reviewed the past medical history, past surgical history, social history and family history with the patient and they are unchanged from previous note.  ALLERGIES:  is allergic to adhesive [tape]; penicillins; and sulfonamide derivatives.  MEDICATIONS:  Current Outpatient Medications  Medication Sig Dispense Refill  . doxycycline (VIBRAMYCIN) 50 MG capsule Take 1 capsule (50 mg total) by mouth 2 (two) times daily. 12 capsule 0  . methocarbamol (ROBAXIN) 500 MG tablet Take 1 tablet (500 mg total) by mouth every 8 (eight) hours as needed for muscle spasms. 25 tablet 0  . oxyCODONE (ROXICODONE) 5 MG immediate release tablet Take 1-2 tablets (5-10 mg total) by mouth every 4 (four) hours as needed. 40 tablet 0  . sertraline (ZOLOFT) 100 MG tablet Take 100 mg by mouth daily.     . tamoxifen (NOLVADEX) 20 MG tablet TAKE 1 TABLET BY MOUTH EVERY DAY 30 tablet 0   No current facility-administered medications for this visit.     PHYSICAL EXAMINATION: ECOG PERFORMANCE STATUS: 1 - Symptomatic but completely ambulatory  Vitals:   01/21/17 1435  BP: 102/61  Pulse: 82  Resp: 20  Temp: 97.9 F (36.6 C)  SpO2: 98%   Filed Weights   01/21/17 1435  Weight: 147 lb 6.4 oz (66.9 kg)    GENERAL:alert, no distress and comfortable SKIN: skin color, texture, turgor are normal, no rashes or significant lesions EYES: normal, Conjunctiva are pink and non-injected, sclera clear OROPHARYNX:no exudate, no erythema and lips, buccal mucosa, and tongue normal  NECK: supple, thyroid normal size, non-tender, without nodularity LYMPH:  no palpable lymphadenopathy in the cervical, axillary or inguinal LUNGS: clear to auscultation and percussion with normal breathing effort HEART: regular rate & rhythm and no murmurs and no lower extremity edema ABDOMEN:abdomen soft, non-tender and normal bowel sounds MUSCULOSKELETAL:no cyanosis of digits and no clubbing    NEURO: alert & oriented x 3 with fluent speech, no focal motor/sensory deficits EXTREMITIES: No lower extremity edema  LABORATORY DATA:  I have reviewed the data as listed   Chemistry      Component Value Date/Time   NA 140 12/23/2016 1231   K 4.0 12/23/2016 1231   CL 101 05/12/2016 0820   CO2 26 12/23/2016 1231   BUN 8.7 12/23/2016 1231   CREATININE 0.8 12/23/2016 1231      Component Value Date/Time   CALCIUM 9.7 12/23/2016 1231   ALKPHOS 79 12/23/2016 1231   AST 16 12/23/2016 1231   ALT 11 12/23/2016 1231   BILITOT 0.50 12/23/2016 1231       Lab Results  Component Value Date   WBC 7.2 12/23/2016   HGB 13.0 12/23/2016   HCT 38.9 12/23/2016   MCV 89.6 12/23/2016   PLT 346 12/23/2016   NEUTROABS 4.5 12/23/2016    ASSESSMENT & PLAN:  Malignant neoplasm of overlapping sites of right breast in female, estrogen receptor positive ([place]) 01/12/2017: Left mastectomy: IDC with DCIS, 2 tumors 3.6 cm ER 90%, PR 95%, Ki-67 5%, HER-2 negative ratio 1.32) and 1.2 cm (ER 95% PR 95% positive Her 2 Positive ratio 3.2, Ki-67 5%) T2 N0 stage Ib Right mastectomy: Invasive and in situ lobular carcinoma involves the dermis of the skin of the nipple, margins negative,  3/3 lymph nodes positive, grade 1, EF 75%, PR 95%, HER-2 negative ratio 1.19, Ki-67 10%, T3N1A stage IIa  Pathology counseling: I discussed the final pathology report of the patient provided  a copy of this report. I discussed the margins as well as lymph node surgeries. We also discussed the final staging along with previously performed ER/PR and HER-2/neu testing.  Recommendation: 1.  Based on the HER-2 positivity, patient will need adjuvant systemic therapy with TCH. 2. I discussed the case with Dr. Donne Hazel and there is no further lymph nodes to be removed in the right axilla. 3.  Adjuvant radiation therapy 4.  Followed by adjuvant antiestrogen therapy Patient may be eligible for [facility] clinical trial    I spent 25  minutes talking to the patient of which more than half was spent in counseling and coordination of care.  No orders of the defined types were placed in this encounter.  The patient has a good understanding of the overall plan. she agrees with it. she will call with any problems that may develop before the next visit here.   Rulon Eisenmenger, MD 01/25/17

## 2017-01-22 ENCOUNTER — Encounter: Payer: Self-pay | Admitting: *Deleted

## 2017-01-22 ENCOUNTER — Telehealth: Payer: Self-pay | Admitting: *Deleted

## 2017-01-22 NOTE — Telephone Encounter (Signed)
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 She was given materials to read on 01/21/17. Chart review shows she has bilateral breast tissue expanders, which makes her ineligible for trial due to non-compatible with MRI. Patient notified. She was thanked for her interest in trial. Research nurse wished her well in her treatment. Will discuss DCP at next office visit. Mauri Reading Michaelle Copas Therapist, sports, BSN Clinical Research Nurse 01/22/17 @ [phone]

## 2017-01-25 ENCOUNTER — Other Ambulatory Visit: Payer: Self-pay | Admitting: Hematology and Oncology

## 2017-01-25 ENCOUNTER — Other Ambulatory Visit: Payer: Self-pay | Admitting: *Deleted

## 2017-01-25 DIAGNOSIS — Z170 Estrogen receptor positive status [ER+]: Secondary | ICD-10-CM

## 2017-01-25 DIAGNOSIS — C50812 Malignant neoplasm of overlapping 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 breast: Secondary | ICD-10-CM

## 2017-01-25 NOTE — Progress Notes (Signed)
START ON PATHWAY REGIMEN - Breast   Docetaxel + Carboplatin + Trastuzumab [facility]):   A cycle is every 21 days:     Trastuzumab      Trastuzumab      Docetaxel      Carboplatin   **Always confirm dose/schedule in your pharmacy ordering system**    Trastuzumab (Maintenance Post TCH) x 11 Cycles:   A cycle is every 21 days:     Trastuzumab   **Always confirm dose/schedule in your pharmacy ordering system**    Patient Characteristics: Postoperative without Neoadjuvant Therapy (Pathologic Staging), Invasive Disease, Adjuvant Therapy, HER2 Positive, ER Positive, Node Negative, pT1c, pN0/N47m Therapeutic Status: Postoperative without Neoadjuvant Therapy (Pathologic Staging) AJCC Grade: G1 AJCC N Category: pN0 AJCC M Category: cM0 ER Status: Positive (+) AJCC 8 Stage Grouping: IA HER2 Status: Positive (+) Oncotype Dx Recurrence Score: Not Appropriate AJCC T Category: pT1c PR Status: Positive (+) Intent of Therapy: Curative Intent, Discussed with Patient

## 2017-01-26 ENCOUNTER — Other Ambulatory Visit: Payer: Self-pay | Admitting: General Surgery

## 2017-01-28 ENCOUNTER — Encounter: Payer: Self-pay | Admitting: *Deleted

## 2017-01-29 ENCOUNTER — Other Ambulatory Visit (HOSPITAL_COMMUNITY): Payer: 59

## 2017-01-29 ENCOUNTER — Other Ambulatory Visit: Payer: 59

## 2017-01-29 ENCOUNTER — Telehealth: Payer: Self-pay | Admitting: Hematology and Oncology

## 2017-01-29 NOTE — Telephone Encounter (Signed)
Scheduled appt per 11/7 sch message - left message with appt date and time. -

## 2017-01-31 ENCOUNTER — Encounter: Payer: Self-pay | Admitting: Hematology and Oncology

## 2017-02-01 ENCOUNTER — Ambulatory Visit (HOSPITAL_COMMUNITY)
Admission: RE | Admit: 2017-02-01 | Discharge: 2017-02-01 | Disposition: A | Discharge status: Home / Self Care | Payer: 59 | Source: Ambulatory Visit | Attending: Hematology and Oncology | Admitting: Hematology and Oncology

## 2017-02-01 ENCOUNTER — Encounter: Payer: Self-pay | Admitting: Hematology and Oncology

## 2017-02-01 DIAGNOSIS — C50812 Malignant neoplasm of overlapping 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 breast: Secondary | ICD-10-CM

## 2017-02-01 DIAGNOSIS — I081 Rheumatic disorders of both mitral and tricuspid valves: Secondary | ICD-10-CM | POA: Diagnosis not present

## 2017-02-01 DIAGNOSIS — Z170 Estrogen receptor positive status [ER+]: Secondary | ICD-10-CM

## 2017-02-01 NOTE — Progress Notes (Signed)
Called patient to introduce myself as Arboriculturist and to discuss possible assistance available such as [employer] First [employer] for Neulasta as well as the [employer]. Left a voicemail with my contact name and number.

## 2017-02-01 NOTE — Progress Notes (Signed)
  Echocardiogram 2D Echocardiogram has been performed.  Amanda Williams 02/01/2017, 11:47 AM

## 2017-02-02 ENCOUNTER — Telehealth: Payer: Self-pay

## 2017-02-02 NOTE — Telephone Encounter (Signed)
Called pt and lvm with call back number to let her know that Dr.Gudena is okay with delaying her chemo start date as requested from her mychart email. Pt going on a cruise and has several concerns and questions that she would like to discuss with MD. Told pt to call back if she would like to sit down with Dr.Gudena and discuss all this before her chemo start date.

## 2017-02-03 ENCOUNTER — Ambulatory Visit (HOSPITAL_BASED_OUTPATIENT_CLINIC_OR_DEPARTMENT_OTHER): Payer: 59 | Admitting: Hematology and Oncology

## 2017-02-03 ENCOUNTER — Telehealth: Payer: Self-pay | Admitting: Hematology and Oncology

## 2017-02-03 DIAGNOSIS — C50812 Malignant neoplasm of overlapping 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 breast: Secondary | ICD-10-CM | POA: Diagnosis not present

## 2017-02-03 DIAGNOSIS — Z170 Estrogen receptor positive status [ER+]: Secondary | ICD-10-CM

## 2017-02-03 NOTE — Progress Notes (Signed)
Patient Care Team: Panosh, Standley Brooking, MD as PCP - General Delsa Bern, MD (Obstetrics and Gynecology) Excell Seltzer, MD as Consulting Physician (General Surgery) Nicholas Lose, MD as Consulting Physician (Hematology and Oncology) Kyung Rudd, MD as Consulting Physician (Radiation Oncology)  DIAGNOSIS:  Encounter Diagnosis  Name Primary?  . Malignant neoplasm of overlapping sites of left breast in female, estrogen receptor positive ([place])     SUMMARY OF ONCOLOGIC HISTORY:   Malignant neoplasm of overlapping 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 breast ([place])   01/12/2017 Surgery    Left mastectomy: IDC with DCIS, 2 tumors 3.6 cm ER 90%, PR 95%, Ki-67 5%, HER-2 negative ratio 1.32) and 1.2 cm (ER 95% PR 95% positive Her 2 Positive ratio 3.2, Ki-67 5%) T2 N0 stage Ib       Malignant neoplasm of overlapping sites of right breast in female, estrogen receptor positive ([place])   12/21/2016 Initial Diagnosis    Palpable lumps in the right breast with nipple inversion: Mammogram revealed skin thickening and distortion, ultrasound revealed 4 masses 3.1 cm at 11:00, 2.5 cm at 6:00, 4 cm at 8:00, 2.2 cm at 5:00 with 2 abnormal axillary lymph nodes: MRI revealed 7 cm abnormality on right breast, in addition 3.6 cm abnormality in the left breast and 2 additional masses 1 cm each; right breast: T4 N1 stage III B clinical stage; left breast: T2 N0 stage IB clinical stage      12/21/2016 Pathology Results    Right breast: Grade 1 Invasive lobular cancer, lymph node also positive, ER 70%, PR 90%, Ki-67 10%, HER-2 negative ratio 1.09 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  The Multi-Cancer Panel was ordered. The Multi-Cancer Panel offered by [facility] includes sequencing and/or deletion duplication testing of the following 83 genes: ALK, APC,  ATM, AXIN2,BAP1,  BARD1, BLM, BMPR1A, BRCA1, BRCA2, BRIP1, CASR, CDC73, CDH1, CDK4, CDKN1B, CDKN1C, CDKN2A (p14ARF), CDKN2A (p16INK4a), CEBPA, CHEK2, CTNNA1, DICER1, DIS3L2, EGFR (c.2369C>T, p.Thr790Met variant only), EPCAM (Deletion/duplication testing only), FH, FLCN, GATA2, GPC3, GREM1 (Promoter region deletion/duplication testing only), HOXB13 (c.251G>A, p.Gly84Glu), HRAS, KIT, MAX, MEN1, MET, MITF (c.952G>A, p.Glu318Lys variant only), MLH1, MSH2, MSH3, MSH6, MUTYH, NBN, NF1, NF2, NTHL1, PALB2, PDGFRA, PHOX2B, PMS2, POLD1, POLE, POT1, PRKAR1A, PTCH1, PTEN, RAD50, RAD51C, RAD51D, RB1, RECQL4, RET, RUNX1, SDHAF2, SDHA (sequence changes only), SDHB, SDHC, SDHD, SMAD4, SMARCA4, SMARCB1, SMARCE1, STK11, SUFU, TERC, TERT, TMEM127, TP53, TSC1, TSC2, VHL, WRN and WT1.   Results: Negative, no pathogenic variants identified.  The date of this test report is 01/04/2017.        01/12/2017 Surgery    Right mastectomy: Invasive and in situ lobular carcinoma involves the dermis of the skin of the nipple, margins negative, 3/3 lymph nodes positive, grade 1, EF 75%, PR 95%, HER-2 negative ratio 1.19, Ki-67 10%, T3N1A stage IIa       CHIEF COMPLIANT: Follow-up to discuss the adjuvant treatment plan  INTERVAL HISTORY: Amanda Williams is a 51 year old with above-mentioned history of right breast cancer who underwent mastectomy and is here today to discuss the adjuvant treatment plan.  Patient had staging scans.  CT chest abdomen pelvis done on 12/28/2016 showed mild axillary lymphadenopathy but no evidence of distant metastatic disease.  Bone scan did not show any bone metastases.   REVIEW OF SYSTEMS:   Constitutional:  Denies fevers, chills or abnormal weight loss Eyes: Denies blurriness of vision Ears, nose, mouth, throat, and face: Denies mucositis or sore throat Respiratory: Denies cough, dyspnea or wheezes Cardiovascular: Denies palpitation, chest discomfort Gastrointestinal:  Denies nausea, heartburn or  change in bowel habits Skin: Denies abnormal skin rashes Lymphatics: Denies new lymphadenopathy or easy bruising Neurological:Denies numbness, tingling or new weaknesses Behavioral/Psych: Mood is stable, no new changes  Extremities: No lower extremity edema Breast: Right mastectomy All other systems were reviewed with the patient and are negative.  I have reviewed the past medical history, past surgical history, social history and family history with the patient and they are unchanged from previous note.  ALLERGIES:  is allergic to adhesive [tape]; penicillins; and sulfonamide derivatives.  MEDICATIONS:  Current Outpatient Medications  Medication Sig Dispense Refill  . sertraline (ZOLOFT) 100 MG tablet Take 100 mg by mouth daily.     . tamoxifen (NOLVADEX) 20 MG tablet TAKE 1 TABLET BY MOUTH EVERY DAY 30 tablet 0   No current facility-administered medications for this visit.     PHYSICAL EXAMINATION: ECOG PERFORMANCE STATUS: 1 - Symptomatic but completely ambulatory  There were no vitals filed for this visit. There were no vitals filed for this visit.  GENERAL:alert, no distress and comfortable SKIN: skin color, texture, turgor are normal, no rashes or significant lesions EYES: normal, Conjunctiva are pink and non-injected, sclera clear OROPHARYNX:no exudate, no erythema and lips, buccal mucosa, and tongue normal  NECK: supple, thyroid normal size, non-tender, without nodularity LYMPH:  no palpable lymphadenopathy in the cervical, axillary or inguinal LUNGS: clear to auscultation and percussion with normal breathing effort HEART: regular rate & rhythm and no murmurs and no lower extremity edema ABDOMEN:abdomen soft, non-tender and normal bowel sounds MUSCULOSKELETAL:no cyanosis of digits and no clubbing  NEURO: alert & oriented x 3 with fluent speech, no focal motor/sensory deficits EXTREMITIES: No lower extremity edema  LABORATORY DATA:  I have reviewed the data as listed    Chemistry      Component Value Date/Time   NA 140 12/23/2016 1231   K 4.0 12/23/2016 1231   CL 101 05/12/2016 0820   CO2 26 12/23/2016 1231   BUN 8.7 12/23/2016 1231   CREATININE 0.8 12/23/2016 1231      Component Value Date/Time   CALCIUM 9.7 12/23/2016 1231   ALKPHOS 79 12/23/2016 1231   AST 16 12/23/2016 1231   ALT 11 12/23/2016 1231   BILITOT 0.50 12/23/2016 1231       Lab Results  Component Value Date   WBC 7.2 12/23/2016   HGB 13.0 12/23/2016   HCT 38.9 12/23/2016   MCV 89.6 12/23/2016   PLT 346 12/23/2016   NEUTROABS 4.5 12/23/2016    ASSESSMENT & PLAN:  Malignant neoplasm of overlapping 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 breast ([place]) 01/12/2017: Left mastectomy: IDC with DCIS, 2 tumors 3.6 cm ER 90%, PR 95%, Ki-67 5%, HER-2 negative ratio 1.32) and 1.2 cm (ER 95% PR 95% positive Her 2 Positive ratio 3.2, Ki-67 5%) T2 N0 stage Ib Right mastectomy: Invasive and in situ lobular carcinoma involves the dermis of the skin of the nipple, margins negative, 3/3 lymph nodes positive, grade 1, EF 75%, PR 95%, HER-2 negative ratio 1.19, Ki-67 10%, T3N1A stage IIa  Treatment plan: 1.  Adjuvant chemotherapy with TCH followed by Herceptin maintenance for 1 year 2. Adjuvant radiation therapy 3. Followed by adjuvant antiestrogen therapy  Chemo counseling: Discussed risks and benefits of Taxotere and carboplatin including risk of  hair loss, nausea, cytopenias and risk of infection, neuropathy risks as well as renal and kidney toxicities.  We also discussed risks of Herceptin causing decline in ejection fraction.  Return to clinic to start her chemotherapy. Patient has a cruise vacation planned from 03/08/2017-03/15/2017 We will postpone cycle 2 chemotherapy by 1 week I instructed her not to eat food in the buffets.  I also instructed her to be careful about sun exposure.  I spent 25 minutes talking to the patient of which more than half was spent in counseling and coordination of care.  No  orders of the defined types were placed in this encounter.  The patient has a good understanding of the overall plan. she agrees with it. she will call with any problems that may develop before the next visit here.   Rulon Eisenmenger, MD 02/03/17

## 2017-02-03 NOTE — Telephone Encounter (Signed)
Gave patient avs and calendar with appts per 11/14 los.  °

## 2017-02-03 NOTE — Assessment & Plan Note (Signed)
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  Adjuvant chemotherapy with TCH followed by Herceptin maintenance for 1 year 2. Adjuvant radiation therapy 3. Followed by adjuvant antiestrogen therapy  Chemo counseling: Discussed risks and benefits of Taxotere and carboplatin including risk of hair loss, nausea, cytopenias and risk of infection, neuropathy risks as well as renal and kidney toxicities.  We also discussed risks of Herceptin causing decline in ejection fraction.  Return to clinic to start her chemotherapy. I also discussed with her about participating in [employer] clinical trial  

## 2017-02-04 ENCOUNTER — Encounter: Payer: Self-pay | Admitting: *Deleted

## 2017-02-08 ENCOUNTER — Encounter: Payer: Self-pay | Admitting: Physical Therapy

## 2017-02-08 ENCOUNTER — Ambulatory Visit: Payer: 59 | Attending: Plastic Surgery | Admitting: Physical Therapy

## 2017-02-08 DIAGNOSIS — M25511 Pain in right shoulder: Secondary | ICD-10-CM

## 2017-02-08 DIAGNOSIS — M25512 Pain in left shoulder: Secondary | ICD-10-CM | POA: Diagnosis present

## 2017-02-08 DIAGNOSIS — R293 Abnormal posture: Secondary | ICD-10-CM | POA: Diagnosis present

## 2017-02-08 DIAGNOSIS — M25612 Stiffness of left shoulder, not elsewhere classified: Secondary | ICD-10-CM | POA: Insufficient documentation

## 2017-02-08 DIAGNOSIS — R600 Localized edema: Secondary | ICD-10-CM | POA: Insufficient documentation

## 2017-02-08 DIAGNOSIS — M6281 Muscle weakness (generalized): Secondary | ICD-10-CM

## 2017-02-08 DIAGNOSIS — M25611 Stiffness of right shoulder, not elsewhere classified: Secondary | ICD-10-CM

## 2017-02-08 NOTE — Therapy (Signed)
[place], [place], [address] Phone: [phone]   Fax:  [phone]  Physical Therapy Evaluation  Patient Details  Name: Amanda Williams MRN: [id number] Date of Birth: 05-30-1965 Referring Provider: Dr. Iran Planas   Encounter Date: 02/08/2017  PT End of Session - 02/08/17 1154    Visit Number  1    Number of Visits  9    Date for PT Re-Evaluation  03/08/17    PT Start Time  0850    PT Stop Time  0928    PT Time Calculation (min)  38 min    Activity Tolerance  Patient tolerated treatment well    Behavior During Therapy  [facility] for tasks assessed/performed       Past Medical History:  Diagnosis Date  . Allergy   . Breast cancer ([place])   . Family history of breast cancer   . Hematuria    ? trigonitis  . Panic attack    echo 2009 normal    Past Surgical History:  Procedure Laterality Date  . BREAST BIOPSY     left  . BREAST RECONSTRUCTION WITH PLACEMENT OF TISSUE EXPANDER AND ALLODERM Bilateral 01/12/2017   Performed by Irene Limbo, MD at [facility]  . RIGHT MODIFIED RADICAL MASTECTOMY, LEFT TOTAL MASTECTOMY WITH LEFT SENTINEL LYMPH NODE BIOPSY Bilateral 01/12/2017   Performed by Rolm Bookbinder, MD at [facility]    There were no vitals filed for this visit.   Subjective Assessment - 02/08/17 0855    Subjective  I had bilateral mastectomies on 01/12/17. I had to take a pain pill last night because I have more than discomfort but it is not pain in the typical sense. It is hard to describe. I have trouble sleeping at night. I have trouble with range of motion with both arms but the right is worst. I had a dissection on the right and a biopsy on the left. I have trouble getting dressed and pulling my shirt over my head. I can't reach my back or shave.     Pertinent History  Patient was diagnosed on 12/10/16 with bilateral grade 1 invasive lobular carcinoma breast  cancer. There are 3 masses on the left each measuring 1 cm located in vasrious quadrants. There are 4 masses on the right breast located in 3 quadrants measuring 2.2, 3.1, 2.5, and 4 cm. All masses that were biopsied are ER/PR positive and HER2 negative with a Ki67 of 10%. An axillary node on the right side was biopsied and found to be positive., 01/12/17- pt underwent bilateral mastectomies, she had drains removed about 2 weeks ago, pt wil have to undergo chemotherpy followed by radiation- she will begin radiaiton on 02/16/17     Patient Stated Goals  to know what kind of exercises to do to increase strength and flexibility, stamina    Currently in Pain?  Yes    Pain Score  3     Pain Location  Chest    Pain Orientation  -- where drains were placed, under tissue expanders    Pain Descriptors / Indicators  Discomfort    Pain Type  Surgical pain    Pain Onset  1 to 4 weeks ago    Pain Frequency  Intermittent    Aggravating Factors   when she moves    Pain Relieving Factors  not sure    Effect of Pain on Daily Activities  limited to ADLs, unable to  vacuum etc.         [facility] PT Assessment - 02/08/17 0001      Assessment   Medical Diagnosis  Bilateral breast cancer    Referring Provider  Dr. Iran Planas    Onset Date/Surgical Date  01/12/17    Hand Dominance  Left    Prior Therapy  none      Precautions   Precautions  Other (comment)    Precaution Comments  at risk for lymphedema      Restrictions   Weight Bearing Restrictions  No      Balance Screen   Has the patient fallen in the past 6 months  No    Has the patient had a decrease in activity level because of a fear of falling?   No    Is the patient reluctant to leave their home because of a fear of falling?   No      Home Film/video editor residence    Living Arrangements  Spouse/significant other;Children Lives with husband and 27 y.o. son; has 11 y.o. 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    Leisure  She was doing  yoga 2x/wk, walks 45 min 2x/wk prior to surgery      Cognition   Overall Cognitive Status  Within Functional Limits for tasks assessed      Observation/Other Assessments   Observations  mild edema noted at bilateral lateral trunk, mastectomy scars are healing well, no steri strips in place, increased tightness of bilateral pecs      Posture/Postural Control   Posture/Postural Control  Postural limitations    Postural Limitations  Rounded Shoulders;Forward head      AROM   Right/Left Shoulder  Left;Right    Right Shoulder Extension  41 Degrees    Right Shoulder Flexion  109 Degrees    Right Shoulder ABduction  84 Degrees    Right Shoulder Internal Rotation  55 Degrees    Right Shoulder External Rotation  71 Degrees    Left Shoulder Extension  39 Degrees    Left Shoulder Flexion  116 Degrees    Left Shoulder ABduction  105 Degrees    Left Shoulder Internal Rotation  62 Degrees    Left Shoulder External Rotation  77 Degrees    Cervical Flexion  --    Cervical Extension  --    Cervical - Right Side Bend  --    Cervical - Left Side Bend  --    Cervical - Right Rotation  --    Cervical - Left Rotation  --      Strength   Overall Strength  --        LYMPHEDEMA/ONCOLOGY QUESTIONNAIRE - 02/08/17 0911      Type   Cancer Type  Bilateral breast cancer      Surgeries   Mastectomy Date  01/12/17  Sentinel Lymph Node Biopsy Date  01/12/17 on L    Axillary Lymph Node Dissection Date  01/12/17 on R    Number Lymph Nodes  Removed  -- pt was told all nodes on R were removed, on the L had 5 biop      Date Lymphedema/Swelling Started   Date  01/12/17      Treatment   Active Chemotherapy Treatment  No 02/16/17    Past Chemotherapy Treatment  No    Active Radiation Treatment  No will begin after chemo    Past Radiation Treatment  No    Current Hormone Treatment  Yes    Drug Name  Tamoxifen    Past Hormone Therapy  No      What other symptoms do you have   Are you Having Heaviness or Tightness  Yes    Are you having Pain  Yes    Are you having pitting edema  No    Is it Hard or Difficult finding clothes that fit  Yes    Do you have infections  No    Is there Decreased scar mobility  Yes scars still healing      Lymphedema Assessments   Lymphedema Assessments  Upper extremities      Right Upper Extremity Lymphedema   15 cm Proximal to Olecranon Process  27.3 cm    10 cm Proximal to Olecranon Process  25.8 cm    Olecranon Process  23.5 cm    10 cm Proximal to Ulnar Styloid Process  20.2 cm    Just Proximal to Ulnar Styloid Process  15 cm    Across Hand at [employer]  18.5 cm    At [place] of 2nd Digit  5.8 cm      Left Upper Extremity Lymphedema   15 cm Proximal to Olecranon Process  28 cm    10 cm Proximal to Olecranon Process  26.5 cm    Olecranon Process  24 cm    10 cm Proximal to Ulnar Styloid Process  20 cm    Just Proximal to Ulnar Styloid Process  15.5 cm    Across Hand at [employer]  18 cm    At [place] of 2nd Digit  5.7 cm          Objective measurements completed on examination: See above findings.              PT Education - 02/08/17 1158    Education provided  Yes    Education Details  anatomy and physiology of lymphatic system, lymphedema risk reduction practices    Person(s) Educated  Patient    Methods  Explanation;Handout    Comprehension  Verbalized understanding           Breast Clinic Goals - 12/23/16 1622      Patient will be able to  verbalize understanding of pertinent lymphedema risk reduction practices relevant to her diagnosis specifically related to skin care.   Time  1    Period  Days    Status  Achieved      Patient will be able to return demonstrate and/or verbalize understanding of the post-op home exercise program related to regaining shoulder range of motion.   Time  1    Period  Days    Status  Achieved      Patient will be able to verbalize understanding of the importance of attending the postoperative After Breast Cancer Class for further lymphedema  risk reduction education and therapeutic exercise.   Time  1    Period  Days    Status  Achieved       Long Term Clinic Goals - 02/08/17 1154      CC Long Term Goal  #1   Title  Pt to be able to independently verbalize lymphedema risk reduction practices    Time  4    Period  Weeks    Status  New    Target Date  03/08/17      CC Long Term Goal  #2   Title  Pt to demonstrate 165 degrees of flexion bilaterally to allow pt to reach up overhead.    Baseline  R 109, L 116    Time  4    Period  Weeks    Status  New    Target Date  03/08/17      CC Long Term Goal  #3   Title  Pt to demonstrate 165 degrees of bilateral shoulder abduction to allow pt to reach out to sides to complete ADLs.    Baseline  R 84, L 105    Time  4    Period  Weeks    Status  New    Target Date  03/08/17      CC Long Term Goal  #4   Title  Pt to be independent in a home exercise program for continued strengthening and stretching.    Time  4    Period  Weeks    Status  New    Target Date  03/08/17      CC Long Term Goal  #5   Title  Pt to report a 75% improvement in swelling in bilateral trunk to allow improved comfort.    Time  4    Period  Weeks    Status  New      CC Long Term Goal  #6   Title  Pt to report a 75% improvement in tightness across chest with bilateral shoulder ROM to allow improved comfort.     Time  4    Period  Weeks    Status  New    Target  Date  03/08/17      Additional Goals   Additional Goals  Yes          Plan - 02/08/17 0938    Clinical Impression Statement  Pt underwent bilateral mastectomies on 01/12/17 for treatment of bilateral breast cancer. She underwent an axillary node dissection with removal of all lymph nodes per pt on the right side and an axillary node biopsy on the left side. She had tissue expanders placed. She will begin chemotherapy on 02/16/17 followed by radiation. Pt now presents with decreased bilateral shoulder ROM with R worse than left and swelling in bilateral trunk. Pt would benefit from skilled PT services to increase bilateral shoulder ROM and strength, decrease pain and tightness across her chest, decrease swelling and instruct pt in a home exercise program for continued strengthening and stretching.     History and Personal Factors relevant to plan of care:  pt is left handed, pt has bilateral breast cancer so decrease ROM of bilateral UEs     Clinical Presentation  Evolving    Clinical Presentation due to:  pt to begin chemotherapy and radiation after that    Clinical Decision Making  Moderate    Rehab Potential  Good    Clinical Impairments Affecting Rehab Potential  pt  to begin chemo followed by radiation    PT Frequency  2x / week    PT Duration  4 weeks    PT Treatment/Interventions  ADLs/Self Care Home Management;Therapeutic exercise;Therapeutic activities;Patient/family education;Orthotic Fit/Training;Manual lymph drainage;Manual techniques;Scar mobilization;Passive range of motion;Taping    PT Next Visit Plan  give dowel exercises, begin gentle AA/A/PROM to bilateral shoulders    Consulted and Agree with Plan of Care  Patient       Patient will benefit from skilled therapeutic intervention in order to improve the following deficits and impairments:  Decreased range of motion, Impaired UE functional use, Pain, Decreased knowledge of precautions, Postural dysfunction, Increased edema,  Decreased strength, Decreased scar mobility, Increased fascial restricitons  Visit Diagnosis: Stiffness of right shoulder, not elsewhere classified  Shoulder stiffness, left  Acute pain of right shoulder  Acute pain of left shoulder  Muscle weakness (generalized)  Abnormal posture  Localized edema     Problem List Patient Active Problem List   Diagnosis Date Noted  . Bilateral breast cancer ([place]) 01/12/2017  . Genetic testing 01/05/2017  . Family history of breast cancer   . Malignant neoplasm of overlapping sites of right breast in female, estrogen receptor positive ([place]) 12/23/2016  . Malignant neoplasm of overlapping 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 breast ([place]) 12/18/2016  . Exposure to communicable disease 08/04/2012  . Counseling about travel 08/04/2012  . Throat symptom 04/12/2012  . Visit for preventive health examination 10/18/2010  . Dense breasts 10/18/2010  . Pelvic pressure in female 05/20/2010  . PANIC ATTACK 03/27/2008  . MICROSCOPIC HEMATURIA 03/27/2008  . PALPITATIONS 02/02/2007  . ALLERGIC RHINITIS 10/07/2006    Allyson Sabal [facility] 02/08/2017, 11:59 AM  [place], [place], [address] Phone: [phone]   Fax:  [phone]  Name: LAVAUN GREENFIELD MRN: [id number] Date of Birth: [birth year]  Manus Gunning, PT 02/08/17 11:59 AM

## 2017-02-09 ENCOUNTER — Encounter: Payer: Self-pay | Admitting: Physical Therapy

## 2017-02-09 ENCOUNTER — Ambulatory Visit: Payer: 59 | Admitting: Physical Therapy

## 2017-02-09 DIAGNOSIS — M25611 Stiffness of right shoulder, not elsewhere classified: Secondary | ICD-10-CM

## 2017-02-09 DIAGNOSIS — M25511 Pain in right shoulder: Secondary | ICD-10-CM

## 2017-02-09 DIAGNOSIS — M25612 Stiffness of left shoulder, not elsewhere classified: Secondary | ICD-10-CM

## 2017-02-09 DIAGNOSIS — M25512 Pain in left shoulder: Secondary | ICD-10-CM

## 2017-02-09 NOTE — Patient Instructions (Signed)
Shoulder: Flexion (Supine)    With hands shoulder width apart, slowly lower dowel to floor behind head. Do not let elbows bend. Keep back flat. Hold _5-15___ seconds. Repeat __10__ times. Do _2___ sessions per day. CAUTION: Stretch slowly and gently.  Copyright  VHI. All rights reserved.  Shoulder: Abduction (Supine)    With right arm flat on floor, hold dowel in palm. Slowly move arm up to side of head by pushing with opposite arm. Do not let elbow bend. Repeat on left side.  Hold _5-15___ seconds. Repeat _10___ times. Do _2___ sessions per day. CAUTION: Stretch slowly and gently.  Copyright  VHI. All rights reserved.

## 2017-02-09 NOTE — Therapy (Signed)
[place], [place], [address] Phone: [phone]   Fax:  [phone]  Physical Therapy Treatment  Patient Details  Name: Amanda Williams MRN: [id number] Date of Birth: 11/20/1965 Referring Provider: Dr. Iran Planas   Encounter Date: 02/09/2017  PT End of Session - 02/09/17 1153    Visit Number  2    Number of Visits  9    Date for PT Re-Evaluation  03/08/17    PT Start Time  1100    PT Stop Time  1145    PT Time Calculation (min)  45 min    Activity Tolerance  Patient tolerated treatment well    Behavior During Therapy  [facility] for tasks assessed/performed       Past Medical History:  Diagnosis Date  . Allergy   . Breast cancer ([place])   . Family history of breast cancer   . Hematuria    ? trigonitis  . Panic attack    echo 2009 normal    Past Surgical History:  Procedure Laterality Date  . BREAST BIOPSY     left  . BREAST RECONSTRUCTION WITH PLACEMENT OF TISSUE EXPANDER AND FLEX HD (ACELLULAR HYDRATED DERMIS) Bilateral 01/12/2017   Procedure: BREAST RECONSTRUCTION WITH PLACEMENT OF TISSUE EXPANDER AND ALLODERM;  Surgeon: Irene Limbo, MD;  Location: [place];  Service: Plastics;  Laterality: Bilateral;  . MASTECTOMY W/ SENTINEL NODE BIOPSY Bilateral 01/12/2017   Procedure: RIGHT MODIFIED RADICAL MASTECTOMY, LEFT TOTAL MASTECTOMY WITH LEFT SENTINEL LYMPH NODE BIOPSY;  Surgeon: Rolm Bookbinder, MD;  Location: [place];  Service: General;  Laterality: Bilateral;    There were no vitals filed for this visit.  Subjective Assessment - 02/09/17 1102    Subjective  My shoulders are feeling tight today. I am still having to take the pain pill to sleep at night.     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 There are 3 masses on the left each measuring 1 cm located in vasrious quadrants. There  are 4 masses on the right breast located in 3 quadrants measuring 2.2, 3.1, 2.5, and 4 cm. All masses that were biopsied are ER/PR positive and HER2 negative with a Ki67 of 10%. An axillary node on the right side was biopsied and found to be positive., 01/12/17- pt underwent bilateral mastectomies, she had drains removed about 2 weeks ago, pt wil have to undergo chemotherpy followed by radiation- she will begin radiaiton on 02/16/17     Patient Stated Goals  to know what kind of exercises to do to increase strength and flexibility, stamina    Currently in Pain?  Yes    Pain Score  3     Pain Location  Chest    Pain Orientation  -- across chest            LYMPHEDEMA/ONCOLOGY QUESTIONNAIRE - 02/08/17 0911      Type   Cancer Type  Bilateral breast cancer      Surgeries   Mastectomy Date  01/12/17    Sentinel Lymph Node Biopsy Date  01/12/17 on L    Axillary Lymph Node Dissection Date  01/12/17 on R    Number Lymph Nodes Removed  -- pt was told all nodes on R were removed, on the L had 5 biop      Date Lymphedema/Swelling Started   Date  01/12/17      Treatment  Active Chemotherapy Treatment  No 02/16/17    Past Chemotherapy Treatment  No    Active Radiation Treatment  No will begin after chemo    Past Radiation Treatment  No    Current Hormone Treatment  Yes    Drug Name  Tamoxifen    Past Hormone Therapy  No      What other symptoms do you have   Are you Having Heaviness or Tightness  Yes    Are you having Pain  Yes    Are you having pitting edema  No    Is it Hard or Difficult finding clothes that fit  Yes    Do you have infections  No    Is there Decreased scar mobility  Yes scars still healing      Lymphedema Assessments   Lymphedema Assessments  Upper extremities      Right Upper Extremity Lymphedema   15 cm Proximal to Olecranon Process  27.3 cm    10 cm Proximal to Olecranon Process  25.8 cm    Olecranon Process  23.5 cm    10 cm Proximal to Ulnar Styloid  Process  20.2 cm    Just Proximal to Ulnar Styloid Process  15 cm    Across Hand at [employer]  18.5 cm    At [place] of 2nd Digit  5.8 cm      Left Upper Extremity Lymphedema   15 cm Proximal to Olecranon Process  28 cm    10 cm Proximal to Olecranon Process  26.5 cm    Olecranon Process  24 cm    10 cm Proximal to Ulnar Styloid Process  20 cm    Just Proximal to Ulnar Styloid Process  15.5 cm    Across Hand at [employer]  18 cm    At [place] of 2nd Digit  5.7 cm               [facility] Adult PT Treatment/Exercise - 02/09/17 0001      Shoulder Exercises: Supine   Flexion  AAROM;Both;10 reps;Other (comment) with dowel with 5 sec holds    ABduction  AAROM;Both;10 reps with dowel with 5 sec holds      Manual Therapy   Manual Therapy  Passive ROM    Passive ROM  to bilateral shoulders in direction of flexion, abduction and ER to pt's tolerance             PT Education - 02/08/17 1158    Education provided  Yes    Education Details  anatomy and physiology of lymphatic system, lymphedema risk reduction practices    Person(s) Educated  Patient    Methods  Explanation;Handout    Comprehension  Verbalized understanding           Breast Clinic Goals - 12/23/16 1622      Patient will be able to verbalize understanding of pertinent lymphedema risk reduction practices relevant to her diagnosis specifically related to skin care.   Time  1    Period  Days    Status  Achieved      Patient will be able to return demonstrate and/or verbalize understanding of the post-op home exercise program related to regaining shoulder range of motion.   Time  1    Period  Days    Status  Achieved      Patient will be able to verbalize understanding of the importance of attending the postoperative After Breast Cancer Class  for further lymphedema risk reduction education and therapeutic exercise.   Time  1    Period  Days    Status  Achieved       Long Term Clinic Goals -  02/08/17 1154      CC Long Term Goal  #1   Title  Pt to be able to independently verbalize lymphedema risk reduction practices    Time  4    Period  Weeks    Status  New    Target Date  03/08/17      CC Long Term Goal  #2   Title  Pt to demonstrate 165 degrees of flexion bilaterally to allow pt to reach up overhead.    Baseline  R 109, L 116    Time  4    Period  Weeks    Status  New    Target Date  03/08/17      CC Long Term Goal  #3   Title  Pt to demonstrate 165 degrees of bilateral shoulder abduction to allow pt to reach out to sides to complete ADLs.    Baseline  R 84, L 105    Time  4    Period  Weeks    Status  New    Target Date  03/08/17      CC Long Term Goal  #4   Title  Pt to be independent in a home exercise program for continued strengthening and stretching.    Time  4    Period  Weeks    Status  New    Target Date  03/08/17      CC Long Term Goal  #5   Title  Pt to report a 75% improvement in swelling in bilateral trunk to allow improved comfort.    Time  4    Period  Weeks    Status  New      CC Long Term Goal  #6   Title  Pt to report a 75% improvement in tightness across chest with bilateral shoulder ROM to allow improved comfort.     Time  4    Period  Weeks    Status  New    Target Date  03/08/17      Additional Goals   Additional Goals  Yes         Plan - 02/09/17 1153    Clinical Impression Statement  Instructed pt in supine dowel exercises today and issued these at part of pt's home exercise program. Began gentle PROM to bilateral shoulders to pt's tolerance. Also issued pt handout regarding ABC class.     Rehab Potential  Good    Clinical Impairments Affecting Rehab Potential  pt to begin chemo followed by radiation    PT Frequency  2x / week    PT Duration  4 weeks    PT Treatment/Interventions  ADLs/Self Care Home Management;Therapeutic exercise;Therapeutic activities;Patient/family education;Orthotic Fit/Training;Manual lymph  drainage;Manual techniques;Scar mobilization;Passive range of motion;Taping    PT Next Visit Plan  see if pt signed up for ABC class on Dec 3, assess indep with dowel exercises, continue gentle AA/A/PROM to bilateral shoulders, add pulleys and ball    PT Home Exercise Plan  Post op shoulder ROM HEP, supine dowel    Consulted and Agree with Plan of Care  Patient       Patient will benefit from skilled therapeutic intervention in order to improve the following deficits and impairments:  Decreased range of  motion, Impaired UE functional use, Pain, Decreased knowledge of precautions, Postural dysfunction, Increased edema, Decreased strength, Decreased scar mobility, Increased fascial restricitons  Visit Diagnosis: Stiffness of right shoulder, not elsewhere classified  Shoulder stiffness, left  Acute pain of right shoulder  Acute pain of left shoulder     Problem List Patient Active Problem List   Diagnosis Date Noted  . Bilateral breast cancer ([place]) 01/12/2017  . Genetic testing 01/05/2017  . Family history of breast cancer   . Malignant neoplasm of overlapping sites of right breast in female, estrogen receptor positive ([place]) 12/23/2016  . Malignant neoplasm of overlapping 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 breast ([place]) 12/18/2016  . Exposure to communicable disease 08/04/2012  . Counseling about travel 08/04/2012  . Throat symptom 04/12/2012  . Visit for preventive health examination 10/18/2010  . Dense breasts 10/18/2010  . Pelvic pressure in female 05/20/2010  . PANIC ATTACK 03/27/2008  . MICROSCOPIC HEMATURIA 03/27/2008  . PALPITATIONS 02/02/2007  . ALLERGIC RHINITIS 10/07/2006    Allyson Sabal [facility] 02/09/2017, 11:56 AM  [place], [place], [address] Phone: [phone]   Fax:  [phone]  Name: Amanda Williams MRN: [id number] Date of Birth: 02-02-1966  Manus Gunning, PT 02/09/17 11:56 AM

## 2017-02-10 ENCOUNTER — Other Ambulatory Visit: Payer: Self-pay

## 2017-02-10 ENCOUNTER — Encounter (HOSPITAL_COMMUNITY): Payer: Self-pay | Admitting: *Deleted

## 2017-02-10 NOTE — Progress Notes (Signed)
Pt denies SOB, chest pain, and being under the care of a cardiologist. Pt denies having a stress test and cardiac cath. Pt denies having an EKG and chest x ray within the last year. Pt denies recent labs. Pt made aware to stop  taking Aspirin, vitamins, fish oil and herbal medications. Do not take any NSAIDs ie: Ibuprofen, Advil, Naproxen (Aleve), Motrin, BC and Goody Powder or any medication containing Aspirin. Pt verbalized understanding of all pre-op instructions. Anesthesia made aware of pt difficult intubation.

## 2017-02-12 ENCOUNTER — Encounter (HOSPITAL_COMMUNITY): Payer: Self-pay | Admitting: Emergency Medicine

## 2017-02-12 MED ORDER — CIPROFLOXACIN IN D5W 400 MG/200ML IV SOLN
400.0000 mg | INTRAVENOUS | Status: AC
  Administered 2017-02-15: 400 mg via INTRAVENOUS
  Filled 2017-02-12: qty 200

## 2017-02-12 MED ORDER — ACETAMINOPHEN 500 MG PO TABS
1000.0000 mg | ORAL_TABLET | ORAL | Status: AC
  Administered 2017-02-15: 1000 mg via ORAL
  Filled 2017-02-12: qty 2

## 2017-02-12 MED ORDER — GABAPENTIN 300 MG PO CAPS
300.0000 mg | ORAL_CAPSULE | ORAL | Status: AC
  Administered 2017-02-15: 300 mg via ORAL
  Filled 2017-02-12: qty 1

## 2017-02-14 NOTE — Anesthesia Preprocedure Evaluation (Addendum)
Anesthesia Evaluation  Patient identified by MRN, date of birth, ID band Patient awake    Reviewed: Allergy & Precautions, NPO status , Patient's Chart, lab work & pertinent test results  History of Anesthesia Complications (+) DIFFICULT AIRWAY  Airway Mallampati: III  TM Distance: >3 FB   Mouth opening: Limited Mouth Opening  Dental  (+) Teeth Intact, Dental Advisory Given   Pulmonary neg pulmonary ROS,    breath sounds clear to auscultation       Cardiovascular negative cardio ROS   Rhythm:Regular Rate:Normal     Neuro/Psych PSYCHIATRIC DISORDERS Anxiety negative neurological ROS     GI/Hepatic negative GI ROS, Neg liver ROS,   Endo/Other  negative endocrine ROS  Renal/GU negative Renal ROS     Musculoskeletal negative musculoskeletal ROS (+)   Abdominal   Peds  Hematology negative hematology ROS (+)   Anesthesia Other Findings Day of surgery medications reviewed with the patient.  Reproductive/Obstetrics                            Lab Results  Component Value Date   WBC 7.2 12/23/2016   HGB 13.0 12/23/2016   HCT 38.9 12/23/2016   MCV 89.6 12/23/2016   PLT 346 12/23/2016   Echo: - Left ventricle: The cavity size was normal. Systolic function was   normal. The estimated ejection fraction was in the range of 55%   to 60%. Wall motion was normal; there were no regional wall   motion abnormalities. Left ventricular diastolic function   parameters were normal. - Aortic valve: Trileaflet; normal thickness leaflets. There was no   regurgitation. - Mitral valve: Structurally normal valve. There was mild   regurgitation. - Right ventricle: The cavity size was normal. Wall thickness was   normal. Systolic function was normal. - Tricuspid valve: There was mild regurgitation. - Pulmonary arteries: Systolic pressure was within the normal   range. - Inferior vena cava: The vessel was  normal in size. - Pericardium, extracardiac: There was no pericardial effusion.  Anesthesia Physical Anesthesia Plan  ASA: II  Anesthesia Plan: MAC and General   Post-op Pain Management:    Induction: Intravenous  PONV Risk Score and Plan: 4 or greater and Ondansetron, Dexamethasone, Midazolam and Scopolamine patch - Pre-op  Airway Management Planned: LMA  Additional Equipment:   Intra-op Plan:   Post-operative Plan: Extubation in OR  Informed Consent: I have reviewed the patients History and Physical, chart, labs and discussed the procedure including the risks, benefits and alternatives for the proposed anesthesia with the patient or authorized representative who has indicated his/her understanding and acceptance.   Dental advisory given  Plan Discussed with: CRNA  Anesthesia Plan Comments:       Anesthesia Quick Evaluation

## 2017-02-15 ENCOUNTER — Other Ambulatory Visit: Payer: Self-pay

## 2017-02-15 ENCOUNTER — Ambulatory Visit (HOSPITAL_COMMUNITY): Payer: 59 | Admitting: Emergency Medicine

## 2017-02-15 ENCOUNTER — Ambulatory Visit (HOSPITAL_COMMUNITY): Payer: 59

## 2017-02-15 ENCOUNTER — Ambulatory Visit (HOSPITAL_COMMUNITY)
Admission: RE | Admit: 2017-02-15 | Discharge: 2017-02-15 | Disposition: A | Discharge status: Home / Self Care | Payer: 59 | Source: Ambulatory Visit | Attending: General Surgery | Admitting: General Surgery

## 2017-02-15 ENCOUNTER — Encounter (HOSPITAL_COMMUNITY)
Admission: RE | Disposition: A | Discharge status: Home / Self Care | Payer: Self-pay | Source: Ambulatory Visit | Attending: General Surgery

## 2017-02-15 ENCOUNTER — Encounter (HOSPITAL_COMMUNITY): Payer: Self-pay | Admitting: *Deleted

## 2017-02-15 DIAGNOSIS — F419 Anxiety disorder, unspecified: Secondary | ICD-10-CM | POA: Diagnosis not present

## 2017-02-15 DIAGNOSIS — Z882 Allergy status to sulfonamides status: Secondary | ICD-10-CM | POA: Diagnosis not present

## 2017-02-15 DIAGNOSIS — C50911 Malignant neoplasm of unspecified site of right female breast: Secondary | ICD-10-CM | POA: Diagnosis not present

## 2017-02-15 DIAGNOSIS — Z880 Allergy status to penicillin: Secondary | ICD-10-CM | POA: Diagnosis not present

## 2017-02-15 DIAGNOSIS — C50812 Malignant neoplasm of overlapping 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 breast: Secondary | ICD-10-CM

## 2017-02-15 DIAGNOSIS — Z170 Estrogen receptor positive status [ER+]: Secondary | ICD-10-CM

## 2017-02-15 DIAGNOSIS — C50919 Malignant neoplasm of unspecified site of unspecified female breast: Secondary | ICD-10-CM

## 2017-02-15 DIAGNOSIS — Z9013 Acquired absence of bilateral breasts and nipples: Secondary | ICD-10-CM | POA: Insufficient documentation

## 2017-02-15 DIAGNOSIS — Z79899 Other long term (current) drug therapy: Secondary | ICD-10-CM | POA: Diagnosis not present

## 2017-02-15 DIAGNOSIS — C50912 Malignant neoplasm of unspecified site of left female breast: Secondary | ICD-10-CM | POA: Diagnosis not present

## 2017-02-15 DIAGNOSIS — Z95828 Presence of other vascular implants and grafts: Secondary | ICD-10-CM

## 2017-02-15 HISTORY — DX: Failed or difficult intubation, initial encounter: T88.4XXA

## 2017-02-15 HISTORY — PX: PORTACATH PLACEMENT: SHX2246

## 2017-02-15 LAB — CBC
HEMATOCRIT: 34.6 % — AB (ref 36.0–46.0)
HEMOGLOBIN: 11.1 g/dL — AB (ref 12.0–15.0)
MCH: 29.2 pg (ref 26.0–34.0)
MCHC: 32.1 g/dL (ref 30.0–36.0)
MCV: 91.1 fL (ref 78.0–100.0)
Platelets: 276 10*3/uL (ref 150–400)
RBC: 3.8 MIL/uL — ABNORMAL LOW (ref 3.87–5.11)
RDW: 12.7 % (ref 11.5–15.5)
WBC: 8.6 10*3/uL (ref 4.0–10.5)

## 2017-02-15 LAB — HCG, SERUM, QUALITATIVE: Preg, Serum: NEGATIVE

## 2017-02-15 SURGERY — INSERTION, TUNNELED CENTRAL VENOUS DEVICE, WITH PORT
Anesthesia: General | Site: Neck

## 2017-02-15 MED ORDER — SODIUM CHLORIDE 0.9% FLUSH: 3.0000 mL | Freq: Two times a day (BID) | INTRAVENOUS | Status: DC

## 2017-02-15 MED ORDER — HEPARIN SOD (PORK) LOCK FLUSH 100 UNIT/ML IV SOLN
INTRAVENOUS | Status: AC
  Filled 2017-02-15: qty 5

## 2017-02-15 MED ORDER — LACTATED RINGERS IV SOLN: INTRAVENOUS | Status: DC

## 2017-02-15 MED ORDER — FENTANYL CITRATE (PF) 250 MCG/5ML IJ SOLN
INTRAMUSCULAR | Status: AC
  Filled 2017-02-15: qty 5

## 2017-02-15 MED ORDER — MEPERIDINE HCL 25 MG/ML IJ SOLN
6.2500 mg | INTRAMUSCULAR | Status: DC | PRN
Start: 2017-02-15 — End: 2017-02-15

## 2017-02-15 MED ORDER — BUPIVACAINE-EPINEPHRINE (PF) 0.25% -1:200000 IJ SOLN
INTRAMUSCULAR | Status: AC
  Filled 2017-02-15: qty 30

## 2017-02-15 MED ORDER — HEPARIN SOD (PORK) LOCK FLUSH 100 UNIT/ML IV SOLN
INTRAVENOUS | Status: DC | PRN
  Administered 2017-02-15: 500 [IU] via INTRAVENOUS

## 2017-02-15 MED ORDER — LACTATED RINGERS IV SOLN
INTRAVENOUS | Status: DC | PRN
  Administered 2017-02-15: 07:00:00 via INTRAVENOUS

## 2017-02-15 MED ORDER — DEXAMETHASONE SODIUM PHOSPHATE 10 MG/ML IJ SOLN
INTRAMUSCULAR | Status: DC | PRN
  Administered 2017-02-15: 10 mg via INTRAVENOUS

## 2017-02-15 MED ORDER — HYDROMORPHONE HCL 1 MG/ML IJ SOLN: 0.2500 mg | INTRAMUSCULAR | Status: DC | PRN

## 2017-02-15 MED ORDER — 0.9 % SODIUM CHLORIDE (POUR BTL) OPTIME
TOPICAL | Status: DC | PRN
  Administered 2017-02-15: 1000 mL

## 2017-02-15 MED ORDER — ACETAMINOPHEN 325 MG PO TABS: 650.0000 mg | ORAL_TABLET | ORAL | Status: DC | PRN

## 2017-02-15 MED ORDER — BUPIVACAINE HCL (PF) 0.25 % IJ SOLN
INTRAMUSCULAR | Status: DC | PRN
  Administered 2017-02-15: 8 mL

## 2017-02-15 MED ORDER — PROPOFOL 10 MG/ML IV BOLUS
INTRAVENOUS | Status: AC
  Filled 2017-02-15: qty 20

## 2017-02-15 MED ORDER — LIDOCAINE 2% (20 MG/ML) 5 ML SYRINGE
INTRAMUSCULAR | Status: DC | PRN
  Administered 2017-02-15: 60 mg via INTRAVENOUS

## 2017-02-15 MED ORDER — SODIUM CHLORIDE 0.9% FLUSH: 3.0000 mL | INTRAVENOUS | Status: DC | PRN

## 2017-02-15 MED ORDER — SODIUM CHLORIDE 0.9 % IV SOLN
INTRAVENOUS | Status: DC | PRN
  Administered 2017-02-15: 500 mL

## 2017-02-15 MED ORDER — SODIUM CHLORIDE 0.9 % IV SOLN: INTRAVENOUS | Status: DC

## 2017-02-15 MED ORDER — ACETAMINOPHEN 650 MG RE SUPP: 650.0000 mg | RECTAL | Status: DC | PRN

## 2017-02-15 MED ORDER — PROMETHAZINE HCL 25 MG/ML IJ SOLN: 6.2500 mg | INTRAMUSCULAR | Status: DC | PRN

## 2017-02-15 MED ORDER — SCOPOLAMINE 1 MG/3DAYS TD PT72SCOPOLAMINE 1 MG/3DAYS
MEDICATED_PATCH | TRANSDERMAL | Status: DC | PRN
Start: 2017-02-15 — End: 2017-02-18
  Administered 2017-02-15: 1 via TRANSDERMAL

## 2017-02-15 MED ORDER — SODIUM CHLORIDE 0.9 % IV SOLN: 250.0000 mL | INTRAVENOUS | Status: DC | PRN

## 2017-02-15 MED ORDER — ONDANSETRON HCL 4 MG/2ML IJ SOLN
INTRAMUSCULAR | Status: DC | PRN
Start: 2017-02-15 — End: 2017-02-18
  Administered 2017-02-15: 4 mg via INTRAVENOUS

## 2017-02-15 MED ORDER — PROPOFOL 10 MG/ML IV BOLUS
INTRAVENOUS | Status: DC | PRN
  Administered 2017-02-15: 200 mg via INTRAVENOUS

## 2017-02-15 MED ORDER — MIDAZOLAM HCL 2 MG/2ML IJ SOLN
INTRAMUSCULAR | Status: AC
  Filled 2017-02-15: qty 2

## 2017-02-15 MED ORDER — MIDAZOLAM HCL 5 MG/5ML IJ SOLN
INTRAMUSCULAR | Status: DC | PRN
Start: 2017-02-15 — End: 2017-02-18
  Administered 2017-02-15: 2 mg via INTRAVENOUS

## 2017-02-15 MED ORDER — MORPHINE SULFATE (PF) 2 MG/ML IV SOLN: 2.0000 mg | INTRAVENOUS | Status: DC | PRN

## 2017-02-15 MED ORDER — OXYCODONE HCL 5 MG PO TABS: 5.0000 mg | ORAL_TABLET | ORAL | Status: DC | PRN

## 2017-02-15 SURGICAL SUPPLY — 49 items
BAG DECANTER FOR FLEXI CONT (MISCELLANEOUS) ×2 IMPLANT
BLADE SURG 11 STRL SS (BLADE) ×2 IMPLANT
BLADE SURG 15 STRL LF DISP TIS (BLADE) ×1 IMPLANT
BLADE SURG 15 STRL SS (BLADE) ×1
CHLORAPREP W/TINT 26ML (MISCELLANEOUS) ×2 IMPLANT
COVER SURGICAL LIGHT HANDLE (MISCELLANEOUS) ×2 IMPLANT
COVER TRANSDUCER ULTRASND GEL (DRAPE) ×2 IMPLANT
CRADLE DONUT ADULT HEAD (MISCELLANEOUS) ×2 IMPLANT
DECANTER SPIKE VIAL GLASS SM (MISCELLANEOUS) ×2 IMPLANT
DERMABOND ADVANCED (GAUZE/BANDAGES/DRESSINGS) ×1
DERMABOND ADVANCED .7 DNX12 (GAUZE/BANDAGES/DRESSINGS) ×1 IMPLANT
DRAPE C-ARM 42X72 X-RAY (DRAPES) ×2 IMPLANT
DRAPE CHEST BREAST 15X10 FENES (DRAPES) ×2 IMPLANT
DRAPE UTILITY XL STRL (DRAPES) ×2 IMPLANT
DRSG TEGADERM 4X4.75 (GAUZE/BANDAGES/DRESSINGS) ×4 IMPLANT
ELECT CAUTERY BLADE 6.4 (BLADE) ×2 IMPLANT
ELECT REM PT RETURN 9FT ADLT (ELECTROSURGICAL) ×2
ELECTRODE REM PT RTRN 9FT ADLT (ELECTROSURGICAL) ×1 IMPLANT
GAUZE SPONGE 4X4 12PLY STRL LF (GAUZE/BANDAGES/DRESSINGS) ×2 IMPLANT
GAUZE SPONGE 4X4 16PLY XRAY LF (GAUZE/BANDAGES/DRESSINGS) ×2 IMPLANT
GEL ULTRASOUND 20GR AQUASONIC (MISCELLANEOUS) ×2 IMPLANT
GLOVE BIO SURGEON STRL SZ7 (GLOVE) ×2 IMPLANT
GLOVE BIOGEL PI IND STRL 7.5 (GLOVE) ×1 IMPLANT
GLOVE BIOGEL PI INDICATOR 7.5 (GLOVE) ×1
GOWN STRL REUS W/ TWL LRG LVL3 (GOWN DISPOSABLE) ×2 IMPLANT
GOWN STRL REUS W/TWL LRG LVL3 (GOWN DISPOSABLE) ×2
INTRODUCER COOK 11FR (CATHETERS) IMPLANT
KIT BASIN OR (CUSTOM PROCEDURE TRAY) ×2 IMPLANT
KIT PORT POWER 8FR ISP CVUE (Miscellaneous) ×2 IMPLANT
KIT ROOM TURNOVER OR (KITS) ×2 IMPLANT
NEEDLE HYPO 25GX1X1/2 BEV (NEEDLE) ×2 IMPLANT
NS IRRIG 1000ML POUR BTL (IV SOLUTION) ×2 IMPLANT
PACK SURGICAL SETUP 50X90 (CUSTOM PROCEDURE TRAY) ×2 IMPLANT
PAD ARMBOARD 7.5X6 YLW CONV (MISCELLANEOUS) ×4 IMPLANT
PENCIL BUTTON HOLSTER BLD 10FT (ELECTRODE) ×2 IMPLANT
SET INTRODUCER 12FR PACEMAKER (SHEATH) IMPLANT
SET SHEATH INTRODUCER 10FR (MISCELLANEOUS) IMPLANT
SHEATH COOK PEEL AWAY SET 9F (SHEATH) IMPLANT
SUT MNCRL AB 4-0 PS2 18 (SUTURE) ×2 IMPLANT
SUT PROLENE 2 0 SH DA (SUTURE) ×2 IMPLANT
SUT SILK 2 0 (SUTURE)
SUT SILK 2-0 18XBRD TIE 12 (SUTURE) IMPLANT
SUT VIC AB 3-0 SH 27 (SUTURE) ×1
SUT VIC AB 3-0 SH 27XBRD (SUTURE) ×1 IMPLANT
SYR 20ML ECCENTRIC (SYRINGE) ×4 IMPLANT
SYR 5ML LUER SLIP (SYRINGE) ×2 IMPLANT
SYR CONTROL 10ML LL (SYRINGE) ×2 IMPLANT
TOWEL OR 17X24 6PK STRL BLUE (TOWEL DISPOSABLE) ×2 IMPLANT
TOWEL OR 17X26 10 PK STRL BLUE (TOWEL DISPOSABLE) IMPLANT

## 2017-02-15 NOTE — Transfer of Care (Signed)
Immediate Anesthesia Transfer of Care Note  Patient: Amanda Williams  Procedure(s) Performed: INSERTION PORT-A-CATH WITH [place] (N/A Neck)  Patient Location: PACU  Anesthesia Type:General  Level of Consciousness: awake, alert  and oriented  Airway & Oxygen Therapy: Patient Spontanous Breathing and Patient connected to nasal cannula oxygen  Post-op Assessment: Report given to RN and Post -op Vital signs reviewed and stable  Post vital signs: Reviewed and stable  Last Vitals:  Vitals:   02/15/17 0609  BP: 138/65  Pulse: 72  Resp: 19  Temp: 36.8 C  SpO2: 98%    Last Pain:  Vitals:   02/15/17 0609  TempSrc: Oral         Complications: No apparent anesthesia complications

## 2017-02-15 NOTE — Discharge Instructions (Signed)
    PORT-A-CATH: POST OP INSTRUCTIONS  Always review your discharge instruction sheet given to you by the facility where your surgery was performed.   1. A prescription for pain medication may be given to you upon discharge. Take your pain medication as prescribed, if needed. If narcotic pain medicine is not needed, then you make take acetaminophen (Tylenol) or ibuprofen (Advil) as needed.  2. Take your usually prescribed medications unless otherwise directed. 3. If you need a refill on your pain medication, please contact our office. All narcotic pain medicine now requires a paper prescription.  Phoned in and fax refills are no longer allowed by law.  Prescriptions will not be filled after 5 pm or on weekends.  4. You should follow a light diet for the remainder of the day after your procedure. 5. Most patients will experience some mild swelling and/or bruising in the area of the incision. It may take several days to resolve. 6. It is common to experience some constipation if taking pain medication after surgery. Increasing fluid intake and taking a stool softener (such as Colace) will usually help or prevent this problem from occurring. A mild laxative (Milk of Magnesia or Miralax) should be taken according to package directions if there are no bowel movements after 48 hours.  7. Unless discharge instructions indicate otherwise, you may remove your bandages 48 hours after surgery, and you may shower at that time. You may have steri-strips (small white skin tapes) in place directly over the incision.  These strips should be left on the skin for 7-10 days.  If your surgeon used Dermabond (skin glue) on the incision, you may shower in 24 hours.  The glue will flake off over the next 2-3 weeks.  8. If your port is left accessed at the end of surgery (needle left in port), the dressing cannot get wet and should only by changed by a healthcare professional. When the port is no longer accessed (when the  needle has been removed), follow step 7.   9. ACTIVITIES:  Limit activity involving your arms for the next 72 hours. Do no strenuous exercise or activity for 1 week. You may drive when you are no longer taking prescription pain medication, you can comfortably wear a seatbelt, and you can maneuver your car. 10.You may need to see your doctor in the office for a follow-up appointment.  Please       check with your doctor.  11.When you receive a new Port-a-Cath, you will get a product guide and        ID card.  Please keep them in case you need them.  WHEN TO CALL YOUR DOCTOR ([phone]): 1. Fever over 101.0 2. Chills 3. Continued bleeding from incision 4. Increased redness and tenderness at the site 5. Shortness of breath, difficulty breathing   The clinic staff is available to answer your questions during regular business hours. Please don't hesitate to call and ask to speak to one of the nurses or medical assistants for clinical concerns. If you have a medical emergency, go to the nearest emergency room or call 911.  A surgeon from [facility] is always on call at the hospital.     For further information, please visit [email or web address]      

## 2017-02-15 NOTE — Op Note (Signed)
Preoperative diagnosis: bilateral breast cancer, needs venous access Postoperative diagnosis: same as above Procedure: right ij US guided powerport insertion Surgeon: Dr Serita Grammes EBL: minimal Anes: general  Specimens none Complications none Drains none Sponge count correct Dispo to pacu stable  Indications: This is a 47 yof with bilateral breast cancer who needs venous access for chemotherapy.   Procedure: After informed consent was obtained the patient was taken to the operating room. She was given antibiotics. Sequential compression devices were on her legs. She was then placed under general anesthesia with an LMA. Then she was prepped and draped in the standard sterile surgical fashion. Surgical timeout was then performed.  I used the ultrasound to identify the right internal jugular vein. I then accessed the vein using the ultrasound.This aspirated blood. I then placed the wire. This was confirmed by fluoroscopy and ultrasound to be in the correct position. I created a pocket on the right chest. I tunneled the line between the 2 sites. I then dilated the tract and placed the dilator assembly with the sheath. This was done under fluoroscopy. I then removed the sheath and dilator. The wire was also removed. The line was then pulled back to be in the venacava. I hooked this up to the port. I sutured this into place with 2-0 Prolene in 2 places. This aspirated blood and flushed easily.This was confirmed with a final fluoroscopy. I then closed this with 2-0 Vicryl and 4-0 Monocryl. I accessed the port with the access kit.  This withdrew blood and I placed hpearin in it. She receives chemotherapy tomorrow. Dermabond was placed on both the incisions.A dressing was placed. She tolerated this well and was transferred to the recovery room in stable condition

## 2017-02-15 NOTE — H&P (Signed)
Amanda Williams is an 51 y.o. female.   Chief Complaint: breast cancer HPI: 59 yof s/p bilateral mastectomies who is doing well. Will need chemotherapy. Here for port placement   Past Medical History:  Diagnosis Date  . Allergy   . Breast cancer ([place])   . Difficult intubation    see 01/12/17 anesthesia record  . Family history of breast cancer   . Hematuria    ? trigonitis  . Panic attack    echo 2009 normal    Past Surgical History:  Procedure Laterality Date  . BREAST BIOPSY     left  . BREAST RECONSTRUCTION WITH PLACEMENT OF TISSUE EXPANDER AND FLEX HD (ACELLULAR HYDRATED DERMIS) Bilateral 01/12/2017   Procedure: BREAST RECONSTRUCTION WITH PLACEMENT OF TISSUE EXPANDER AND ALLODERM;  Surgeon: Irene Limbo, MD;  Location: [facility];  Service: Plastics;  Laterality: Bilateral;  . MASTECTOMY W/ SENTINEL NODE BIOPSY Bilateral 01/12/2017   Procedure: RIGHT MODIFIED RADICAL MASTECTOMY, LEFT TOTAL MASTECTOMY WITH LEFT SENTINEL LYMPH NODE BIOPSY;  Surgeon: Rolm Bookbinder, MD;  Location: [facility];  Service: General;  Laterality: Bilateral;    Family History  Problem Relation Age of Onset  . Breast cancer Maternal Grandmother 87   Social History:  reports that  has never smoked. she has never used smokeless tobacco. She reports that she drinks about 2.4 oz of alcohol per week. She reports that she does not use drugs.  Allergies:  Allergies  Allergen Reactions  . Penicillins Hives and Other (See Comments)    Has patient had a PCN reaction causing immediate rash, facial/tongue/throat swelling, SOB or lightheadedness with hypotension: No HAS PT DEVELOPED SEVERE RASH INVOLVING MUCUS MEMBRANES or SKIN NECROSIS: #  #  #  YES  #  #  #   Has patient had a PCN reaction that required hospitalization: No Has patient had a PCN reaction occurring within the last 10 years: No If all of the above answers are "NO", then may proceed with Cephalosporin use.    . Sulfonamide Derivatives Hives  . Adhesive [Tape] Other (See Comments)    Redness     Medications Prior to Admission  Medication Sig Dispense Refill  . acetaminophen (TYLENOL) 500 MG tablet Take 1,000 mg 2 (two) times daily as needed by mouth for moderate pain.    Marland Kitchen ibuprofen (ADVIL,MOTRIN) 200 MG tablet Take 400 mg 2 (two) times daily as needed by mouth for moderate pain.    Marland Kitchen oxyCODONE (OXY IR/ROXICODONE) 5 MG immediate release tablet Take 5 mg at bedtime as needed by mouth for pain.    Marland Kitchen sertraline (ZOLOFT) 100 MG tablet Take 100 mg every evening by mouth.     . tamoxifen (NOLVADEX) 20 MG tablet TAKE 1 TABLET BY MOUTH EVERY DAY (Patient taking differently: TAKE 1 TABLET BY MOUTH EVERY DAY IN THE EVENING) 30 tablet 0    No results found for this or any previous visit (from the past 48 hour(s)). No results found.  Review of Systems  All other systems reviewed and are negative.   Blood pressure 138/65, pulse 72, temperature 98.3 F (36.8 C), temperature source Oral, resp. rate 19, height 5\' 6"  (1.676 m), weight 68 kg (150 lb), last menstrual period 12/07/2016, SpO2 98 %. Physical Exam  Vitals reviewed. Constitutional: She appears well-developed and well-nourished.  Cardiovascular: Normal rate and regular rhythm.  Respiratory: Effort normal and breath sounds normal.  GI: Soft.     Assessment/Plan Breast cancer Port placement today  Rolm Bookbinder, MD 02/15/2017, 7:00 AM

## 2017-02-15 NOTE — Interval H&P Note (Signed)
History and Physical Interval Note:  02/15/2017 7:01 AM  Amanda Williams  has presented today for surgery, with the diagnosis of BREAST CANCER  The various methods of treatment have been discussed with the patient and family. After consideration of risks, benefits and other options for treatment, the patient has consented to  Procedure(s): INSERTION PORT-A-CATH WITH [place] (N/A) as a surgical intervention .  The patient's history has been reviewed, patient examined, no change in status, stable for surgery.  I have reviewed the patient's chart and labs.  Questions were answered to the patient's satisfaction.     Rolm Bookbinder

## 2017-02-15 NOTE — Anesthesia Procedure Notes (Signed)
Procedure Name: LMA Insertion Date/Time: 02/15/2017 7:47 AM Performed by: Babs Bertin, CRNA Pre-anesthesia Checklist: Patient identified, Emergency Drugs available, Suction available and Patient being monitored Patient Re-evaluated:Patient Re-evaluated prior to induction Oxygen Delivery Method: Circle System Utilized Preoxygenation: Pre-oxygenation with 100% oxygen Induction Type: IV induction Ventilation: Mask ventilation without difficulty LMA: LMA inserted LMA Size: 4.0 Number of attempts: 1 Airway Equipment and Method: Bite block Placement Confirmation: positive ETCO2 Tube secured with: Tape Dental Injury: Teeth and Oropharynx as per pre-operative assessment

## 2017-02-16 ENCOUNTER — Ambulatory Visit: Payer: 59

## 2017-02-16 ENCOUNTER — Ambulatory Visit (HOSPITAL_BASED_OUTPATIENT_CLINIC_OR_DEPARTMENT_OTHER): Payer: 59

## 2017-02-16 ENCOUNTER — Telehealth: Payer: Self-pay | Admitting: Adult Health

## 2017-02-16 ENCOUNTER — Encounter: Payer: Self-pay | Admitting: Adult Health

## 2017-02-16 ENCOUNTER — Ambulatory Visit (HOSPITAL_BASED_OUTPATIENT_CLINIC_OR_DEPARTMENT_OTHER): Payer: 59 | Admitting: Adult Health

## 2017-02-16 ENCOUNTER — Encounter: Payer: Self-pay | Admitting: *Deleted

## 2017-02-16 ENCOUNTER — Other Ambulatory Visit (HOSPITAL_BASED_OUTPATIENT_CLINIC_OR_DEPARTMENT_OTHER): Payer: 59

## 2017-02-16 VITALS — BP 125/62 | HR 79 | Temp 98.6°F | Resp 17

## 2017-02-16 VITALS — BP 144/81 | HR 70 | Temp 98.0°F | Resp 18 | Ht 66.0 in | Wt 151.2 lb

## 2017-02-16 DIAGNOSIS — Z170 Estrogen receptor positive status [ER+]: Secondary | ICD-10-CM

## 2017-02-16 DIAGNOSIS — C50812 Malignant neoplasm of overlapping 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 breast: Secondary | ICD-10-CM

## 2017-02-16 DIAGNOSIS — C50012 Malignant neoplasm of nipple and areola, left female breast: Secondary | ICD-10-CM

## 2017-02-16 DIAGNOSIS — C50011 Malignant neoplasm of nipple and areola, right female breast: Secondary | ICD-10-CM

## 2017-02-16 DIAGNOSIS — Z9013 Acquired absence of bilateral breasts and nipples: Secondary | ICD-10-CM | POA: Diagnosis not present

## 2017-02-16 DIAGNOSIS — C50811 Malignant neoplasm of overlapping sites of right female breast: Secondary | ICD-10-CM

## 2017-02-16 DIAGNOSIS — Z5112 Encounter for antineoplastic immunotherapy: Secondary | ICD-10-CM

## 2017-02-16 DIAGNOSIS — Z5189 Encounter for other specified aftercare: Secondary | ICD-10-CM | POA: Diagnosis not present

## 2017-02-16 DIAGNOSIS — D729 Disorder of white blood cells, unspecified: Secondary | ICD-10-CM

## 2017-02-16 LAB — CBC WITH DIFFERENTIAL/PLATELET
BASO%: 0.1 % (ref 0.0–2.0)
Basophils Absolute: 0 10*3/uL (ref 0.0–0.1)
EOS ABS: 0 10*3/uL (ref 0.0–0.5)
EOS%: 0 % (ref 0.0–7.0)
HEMATOCRIT: 32.6 % — AB (ref 34.8–46.6)
HEMOGLOBIN: 10.6 g/dL — AB (ref 11.6–15.9)
LYMPH#: 1.3 10*3/uL (ref 0.9–3.3)
LYMPH%: 8.5 % — ABNORMAL LOW (ref 14.0–49.7)
MCH: 29.5 pg (ref 25.1–34.0)
MCHC: 32.7 g/dL (ref 31.5–36.0)
MCV: 90.2 fL (ref 79.5–101.0)
MONO#: 0.7 10*3/uL (ref 0.1–0.9)
MONO%: 4.2 % (ref 0.0–14.0)
NEUT%: 87.2 % — ABNORMAL HIGH (ref 38.4–76.8)
NEUTROS ABS: 13.6 10*3/uL — AB (ref 1.5–6.5)
PLATELETS: 313 10*3/uL (ref 145–400)
RBC: 3.61 10*6/uL — ABNORMAL LOW (ref 3.70–5.45)
RDW: 12.9 % (ref 11.2–14.5)
WBC: 15.6 10*3/uL — AB (ref 3.9–10.3)

## 2017-02-16 LAB — COMPREHENSIVE METABOLIC PANEL
ALT: 9 U/L (ref 0–55)
ANION GAP: 11 meq/L (ref 3–11)
AST: 11 U/L (ref 5–34)
Albumin: 3.3 g/dL — ABNORMAL LOW (ref 3.5–5.0)
Alkaline Phosphatase: 62 U/L (ref 40–150)
BUN: 9.7 mg/dL (ref 7.0–26.0)
CALCIUM: 8.8 mg/dL (ref 8.4–10.4)
CHLORIDE: 106 meq/L (ref 98–109)
CO2: 22 mEq/L (ref 22–29)
Creatinine: 0.8 mg/dL (ref 0.6–1.1)
Glucose: 157 mg/dl — ABNORMAL HIGH (ref 70–140)
POTASSIUM: 3.7 meq/L (ref 3.5–5.1)
Sodium: 138 mEq/L (ref 136–145)
Total Bilirubin: 0.26 mg/dL (ref 0.20–1.20)
Total Protein: 6.7 g/dL (ref 6.4–8.3)

## 2017-02-16 MED ORDER — PROCHLORPERAZINE MALEATE 10 MG PO TABS: 10.0000 mg | ORAL_TABLET | Freq: Four times a day (QID) | ORAL | 1 refills | Status: DC | PRN

## 2017-02-16 MED ORDER — SODIUM CHLORIDE 0.9 % IV SOLN
677.4000 mg | Freq: Once | INTRAVENOUS | Status: AC
  Administered 2017-02-16: 680 mg via INTRAVENOUS
  Filled 2017-02-16: qty 68

## 2017-02-16 MED ORDER — SODIUM CHLORIDE 0.9 % IV SOLN
8.0000 mg/kg | Freq: Once | INTRAVENOUS | Status: AC
  Administered 2017-02-16: 525 mg via INTRAVENOUS
  Filled 2017-02-16: qty 25

## 2017-02-16 MED ORDER — SODIUM CHLORIDE 0.9% FLUSH
10.0000 mL | Freq: Once | INTRAVENOUS | Status: AC
  Administered 2017-02-16: 10 mL
  Filled 2017-02-16: qty 10

## 2017-02-16 MED ORDER — PEGFILGRASTIM 6 MG/0.6ML ~~LOC~~ PSKT
6.0000 mg | PREFILLED_SYRINGE | Freq: Once | SUBCUTANEOUS | Status: AC
  Administered 2017-02-16: 6 mg via SUBCUTANEOUS
  Filled 2017-02-16: qty 0.6

## 2017-02-16 MED ORDER — DEXAMETHASONE SODIUM PHOSPHATE 10 MG/ML IJ SOLN
INTRAMUSCULAR | Status: AC
  Filled 2017-02-16: qty 1

## 2017-02-16 MED ORDER — PALONOSETRON HCL INJECTION 0.25 MG/5ML
INTRAVENOUS | Status: AC
  Filled 2017-02-16: qty 5

## 2017-02-16 MED ORDER — DEXAMETHASONE SODIUM PHOSPHATE 10 MG/ML IJ SOLN
10.0000 mg | Freq: Once | INTRAMUSCULAR | Status: AC
  Administered 2017-02-16: 10 mg via INTRAVENOUS

## 2017-02-16 MED ORDER — SODIUM CHLORIDE 0.9 % IV SOLN
Freq: Once | INTRAVENOUS | Status: AC
  Administered 2017-02-16: 10:00:00 via INTRAVENOUS

## 2017-02-16 MED ORDER — DIPHENHYDRAMINE HCL 25 MG PO CAPS
ORAL_CAPSULE | ORAL | Status: AC
  Filled 2017-02-16: qty 2

## 2017-02-16 MED ORDER — PALONOSETRON HCL INJECTION 0.25 MG/5ML
0.2500 mg | Freq: Once | INTRAVENOUS | Status: AC
  Administered 2017-02-16: 0.25 mg via INTRAVENOUS

## 2017-02-16 MED ORDER — LIDOCAINE-PRILOCAINE 2.5-2.5 % EX CREA: TOPICAL_CREAM | CUTANEOUS | 3 refills | Status: DC

## 2017-02-16 MED ORDER — ACETAMINOPHEN 325 MG PO TABS
ORAL_TABLET | ORAL | Status: AC
  Filled 2017-02-16: qty 2

## 2017-02-16 MED ORDER — HEPARIN SOD (PORK) LOCK FLUSH 100 UNIT/ML IV SOLN
500.0000 [IU] | Freq: Once | INTRAVENOUS | Status: AC | PRN
  Administered 2017-02-16: 500 [IU]
  Filled 2017-02-16: qty 5

## 2017-02-16 MED ORDER — DIPHENHYDRAMINE HCL 25 MG PO CAPS
50.0000 mg | ORAL_CAPSULE | Freq: Once | ORAL | Status: AC
  Administered 2017-02-16: 50 mg via ORAL

## 2017-02-16 MED ORDER — ACETAMINOPHEN 325 MG PO TABS
650.0000 mg | ORAL_TABLET | Freq: Once | ORAL | Status: AC
  Administered 2017-02-16: 650 mg via ORAL

## 2017-02-16 MED ORDER — ONDANSETRON HCL 8 MG PO TABS: 8.0000 mg | ORAL_TABLET | Freq: Two times a day (BID) | ORAL | 1 refills | Status: DC | PRN

## 2017-02-16 MED ORDER — SODIUM CHLORIDE 0.9% FLUSH
10.0000 mL | INTRAVENOUS | Status: DC | PRN
  Administered 2017-02-16: 10 mL
  Filled 2017-02-16: qty 10

## 2017-02-16 MED ORDER — DEXAMETHASONE 4 MG PO TABS: 4.0000 mg | ORAL_TABLET | Freq: Every day | ORAL | 0 refills | Status: DC

## 2017-02-16 MED ORDER — LORAZEPAM 0.5 MG PO TABS: 0.5000 mg | ORAL_TABLET | Freq: Four times a day (QID) | ORAL | 0 refills | Status: DC | PRN

## 2017-02-16 MED ORDER — SODIUM CHLORIDE 0.9 % IV SOLN
75.0000 mg/m2 | Freq: Once | INTRAVENOUS | Status: AC
  Administered 2017-02-16: 130 mg via INTRAVENOUS
  Filled 2017-02-16: qty 13

## 2017-02-16 NOTE — Progress Notes (Signed)
Discharge and Neulasta instructions printed and verbally reviewed. Pt verbalizes understanding.  Per Dr. Jana Hakim okay to place Neulasta on pro on patient's arm if pt requests.

## 2017-02-16 NOTE — Progress Notes (Signed)
Amanda Cancer Follow up:    Burnis Medin, MD [place] [id number]   DIAGNOSIS: Cancer Staging Malignant neoplasm of overlapping 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 breast [facility]) Staging form: Breast, AJCC 8th Edition - Clinical: cT2, cN0, cM0, G1, ER: Unknown, PR: Unknown, HER2: Unknown - Unsigned - Pathologic: Stage IA (pT2, pN0, cM0, G1, ER: Positive, PR: Positive, HER2: Positive) - Signed by Gardenia Phlegm, NP on 01/27/2017  Malignant neoplasm of overlapping sites of right breast in female, estrogen receptor positive ([place]) Staging form: Breast, AJCC 8th Edition - Clinical stage from 12/23/2016: Stage IIIB (cT4, cN1, cM0, G1, ER: Positive, PR: Positive, HER2: Negative) - Unsigned - Pathologic: Stage IB (pT3, pN1a, cM0, G1, ER: Positive, PR: Positive, HER2: Negative) - Signed by Gardenia Phlegm, NP on 01/27/2017   SUMMARY OF ONCOLOGIC HISTORY:   Malignant neoplasm of overlapping 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 breast ([place])   01/12/2017 Surgery    Left mastectomy: IDC with DCIS, 2 tumors 3.6 cm ER 90%, PR 95%, Ki-67 5%, HER-2 negative ratio 1.32) and 1.2 cm (ER 95% PR 95% positive Her 2 Positive ratio 3.2, Ki-67 5%) T2 N0 stage Ib       Malignant neoplasm of overlapping sites of right breast in female, estrogen receptor positive ([place])   12/21/2016 Initial Diagnosis    Palpable lumps in the right breast with nipple inversion: Mammogram revealed skin thickening and distortion, ultrasound revealed 4 masses 3.1 cm at 11:00, 2.5 cm at 6:00, 4 cm at 8:00, 2.2 cm at 5:00 with 2 abnormal axillary lymph nodes: MRI revealed 7 cm abnormality on right breast, in addition 3.6 cm abnormality in the left breast and 2 additional masses 1 cm each; right breast: T4 N1 stage III B clinical stage; left breast: T2 N0 stage IB clinical stage      12/21/2016 Pathology Results    Right breast: Grade 1 Invasive lobular cancer, lymph node also positive, ER 70%, PR 90%, Ki-67  10%, HER-2 negative ratio 1.09 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  The Multi-Cancer Panel was ordered. The Multi-Cancer Panel offered by [facility] includes sequencing and/or deletion duplication testing of the following 83 genes: ALK, APC, ATM, AXIN2,BAP1,  BARD1, BLM, BMPR1A, BRCA1, BRCA2, BRIP1, CASR, CDC73, CDH1, CDK4, CDKN1B, CDKN1C, CDKN2A (p14ARF), CDKN2A (p16INK4a), CEBPA, CHEK2, CTNNA1, DICER1, DIS3L2, EGFR (c.2369C>T, p.Thr790Met variant only), EPCAM (Deletion/duplication testing only), FH, FLCN, GATA2, GPC3, GREM1 (Promoter region deletion/duplication testing only), HOXB13 (c.251G>A, p.Gly84Glu), HRAS, KIT, MAX, MEN1, MET, MITF (c.952G>A, p.Glu318Lys variant only), MLH1, MSH2, MSH3, MSH6, MUTYH, NBN, NF1, NF2, NTHL1, PALB2, PDGFRA, PHOX2B, PMS2, POLD1, POLE, POT1, PRKAR1A, PTCH1, PTEN, RAD50, RAD51C, RAD51D, RB1, RECQL4, RET, RUNX1, SDHAF2, SDHA (sequence changes only), SDHB, SDHC, SDHD, SMAD4, SMARCA4, SMARCB1, SMARCE1, STK11, SUFU, TERC, TERT, TMEM127, TP53, TSC1, TSC2, VHL, WRN and WT1.   Results: Negative, no pathogenic variants identified.  The date of this test report is 01/04/2017.        01/12/2017 Surgery    Right mastectomy: Invasive and in situ lobular carcinoma involves the dermis of the skin of the nipple, margins negative, 3/3 lymph nodes positive, grade 1, EF 75%, PR 95%, HER-2 negative ratio 1.19, Ki-67 10%, T3N1A stage IIa       CURRENT THERAPY: TCH  INTERVAL HISTORY:  Amanda Williams 51 y.o. female returns for evaluation prior to receiving her first cycle of [facility].  She underwent port placement yesterday and tolerated this procedure well.  She denies any issues today.  Her WBC is slightly elevated however she denies any fevers, chills, or any other concerns.  She does not have any of her anti  emetics and she is requesting I send these into her pharmacy.     Patient Active Problem List   Diagnosis Date Noted  . Bilateral breast cancer ([place]) 01/12/2017  . Genetic testing 01/05/2017  . Family history of breast cancer   . Malignant neoplasm of overlapping sites of right breast in female, estrogen receptor positive ([place]) 12/23/2016  . Malignant neoplasm of overlapping 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 breast ([place]) 12/18/2016  . Exposure to communicable disease 08/04/2012  . Counseling about travel 08/04/2012  . Throat symptom 04/12/2012  . Visit for preventive health examination 10/18/2010  . Dense breasts 10/18/2010  . Pelvic pressure in female 05/20/2010  . PANIC ATTACK 03/27/2008  . MICROSCOPIC HEMATURIA 03/27/2008  . PALPITATIONS 02/02/2007  . ALLERGIC RHINITIS 10/07/2006    is allergic to penicillins; sulfonamide derivatives; and adhesive [tape].  MEDICAL HISTORY: Past Medical History:  Diagnosis Date  . Allergy   . Breast cancer ([place])   . Difficult intubation    see 01/12/17 anesthesia record  . Family history of breast cancer   . Hematuria    ? trigonitis  . Panic attack    echo 2009 normal    SURGICAL HISTORY: Past Surgical History:  Procedure Laterality Date  . BREAST BIOPSY     left  . BREAST RECONSTRUCTION WITH PLACEMENT OF TISSUE EXPANDER AND FLEX HD (ACELLULAR HYDRATED DERMIS) Bilateral 01/12/2017   Procedure: BREAST RECONSTRUCTION WITH PLACEMENT OF TISSUE EXPANDER AND ALLODERM;  Surgeon: Irene Limbo, MD;  Location: [facility];  Service: Plastics;  Laterality: Bilateral;  . MASTECTOMY W/ SENTINEL NODE BIOPSY Bilateral 01/12/2017   Procedure: RIGHT MODIFIED RADICAL MASTECTOMY, LEFT TOTAL MASTECTOMY WITH LEFT SENTINEL LYMPH NODE BIOPSY;  Surgeon: Rolm Bookbinder, MD;  Location: [facility];  Service: General;  Laterality: Bilateral;    SOCIAL HISTORY: Social History   Socioeconomic History  . Marital status: Married     Spouse name: Not on file  . Number of children: Not on file  . Years of education: Not on file  . Highest education level: Not on file  Social Needs  . Financial resource strain: Not on file  . Food insecurity - worry: Not on file  . Food insecurity - inability: Not on file  . Transportation needs - medical: Not on file  . Transportation needs - non-medical: Not on file  Occupational History  . Not on file  Tobacco Use  . Smoking status: Never Smoker  . Smokeless tobacco: Never Used  Substance and Sexual Activity  . Alcohol use: Yes    Alcohol/week: 2.4 oz    Types: 4 Glasses of wine per week    Comment: ocassionally  . Drug use: No  . Sexual activity: Not on file    Comment: Husband has had vasectomy  Other Topics Concern  . Not on file  Social History Narrative   hhof  4    Pets dog and cat.   Works [employer] .     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    Has smoke detector and  wears seat belts.  No firearms. No excess sun exposure. Sees dentist regularly .           FAMILY HISTORY: Family History  Problem Relation Age of Onset  . Breast cancer Maternal Grandmother 87    Review of Systems  Constitutional: Negative for appetite change, chills, fatigue, fever and unexpected weight change.  HENT:   Negative for hearing loss, lump/mass and sore throat.   Eyes: Negative for eye problems and icterus.  Respiratory: Negative for chest tightness, cough and shortness of breath.   Cardiovascular: Negative for chest pain, leg swelling and palpitations.  Gastrointestinal: Negative for abdominal distention, abdominal pain, constipation, diarrhea, nausea and vomiting.  Endocrine: Negative for hot flashes.  Musculoskeletal: Negative for arthralgias.  Skin: Negative for itching and rash.  Neurological: Negative for dizziness, extremity weakness, headaches and numbness.  Hematological: Negative for adenopathy. Does not bruise/bleed easily.  Psychiatric/Behavioral:  Negative for depression. The patient is not nervous/anxious.       PHYSICAL EXAMINATION  ECOG PERFORMANCE STATUS: 0 - Asymptomatic  Vitals:   02/16/17 0842  BP: (!) 144/81  Pulse: 70  Resp: 18  Temp: 98 F (36.7 C)  SpO2: 99%    Physical Exam  Constitutional: She is oriented to person, place, and time and well-developed, well-nourished, and in no distress.  HENT:  Head: Normocephalic and atraumatic.  Mouth/Throat: Oropharynx is clear and moist. No oropharyngeal exudate.  Eyes: Pupils are equal, round, and reactive to light. No scleral icterus.  Neck: Neck supple.  Cardiovascular: Normal rate, regular rhythm, normal heart sounds and intact distal pulses.  Pulmonary/Chest: Effort normal and breath sounds normal. No respiratory distress. She has no wheezes. She has no rales. She exhibits no tenderness.  Abdominal: Soft. Bowel sounds are normal. She exhibits no distension and no mass. There is no tenderness. There is no rebound and no guarding.  Musculoskeletal: She exhibits no edema.  Lymphadenopathy:    She has no cervical adenopathy.  Neurological: She is alert and oriented to person, place, and time.  Skin: Skin is warm and dry. No rash noted.  Psychiatric: Mood and affect normal.    LABORATORY DATA:  CBC    Component Value Date/Time   WBC 15.6 (H) 02/16/2017 0814   WBC 8.6 02/15/2017 0631   RBC 3.61 (L) 02/16/2017 0814   RBC 3.80 (L) 02/15/2017 0631   HGB 10.6 (L) 02/16/2017 0814   HCT 32.6 (L) 02/16/2017 0814   PLT 313 02/16/2017 0814   MCV 90.2 02/16/2017 0814   MCH 29.5 02/16/2017 0814   MCH 29.2 02/15/2017 0631   MCHC 32.7 02/16/2017 0814   MCHC 32.1 02/15/2017 0631   RDW 12.9 02/16/2017 0814   LYMPHSABS 1.3 02/16/2017 0814   MONOABS 0.7 02/16/2017 0814   EOSABS 0.0 02/16/2017 0814   BASOSABS 0.0 02/16/2017 0814    CMP     Component Value Date/Time   NA 138 02/16/2017 0814   K 3.7 02/16/2017 0814   CL 101 05/12/2016 0820   CO2 22 02/16/2017 0814    GLUCOSE 157 (H) 02/16/2017 0814   BUN 9.7 02/16/2017 0814   CREATININE 0.8 02/16/2017 0814   CALCIUM 8.8 02/16/2017 0814   PROT 6.7 02/16/2017 0814   ALBUMIN 3.3 (L) 02/16/2017 0814   AST 11 02/16/2017 0814   ALT 9 02/16/2017 0814   ALKPHOS 62 02/16/2017 0814   BILITOT 0.26 02/16/2017 0814   GFRNONAA >60 05/01/2009 1127   GFRAA  05/01/2009 1127    >60  The eGFR has been calculated using the MDRD equation. This calculation has not been validated in all clinical situations. eGFR's persistently <60 mL/min signify possible Chronic Kidney Disease.      RADIOGRAPHIC STUDIES:  Dg Chest Port 1 View  Result Date: 02/15/2017 CLINICAL DATA:  Port placement EXAM: PORTABLE CHEST 1 VIEW COMPARISON:  Chest CT 12/28/2016 FINDINGS: Right port is in place with the tip in the lower SVC. No pneumothorax. Heart and mediastinal contours are within normal limits. No focal opacities or effusions. No acute bony abnormality. Tissue expanders noted in the chest wall bilaterally. IMPRESSION: Right Port-A-Cath placement with the tip in the SVC. No pneumothorax. No active cardiopulmonary disease. Electronically Signed   By: Rolm Baptise M.D.   On: 02/15/2017 09:57   Dg Fluoro Guide Cv Line-no Report  Result Date: 02/15/2017 Fluoroscopy was utilized by the requesting physician.  No radiographic interpretation.     ASSESSMENT and PLAN:   Malignant neoplasm of overlapping sites of right breast in female, estrogen receptor positive ([place]) 01/12/2017: Left mastectomy: IDC with DCIS, 2 tumors 3.6 cm ER 90%, PR 95%, Ki-67 5%, HER-2 negative ratio 1.32) and 1.2 cm (ER 95% PR 95% positive Her 2 Positive ratio 3.2, Ki-67 5%) T2 N0 stage Ib Right mastectomy: Invasive and in situ lobular carcinoma involves the dermis of the skin of the nipple, margins negative, 3/3 lymph nodes positive, grade 1, EF 75%, PR 95%, HER-2 negative ratio 1.19, Ki-67 10%, T3N1A stage IIa  Treatment plan: 1.  Adjuvant  chemotherapy with TCH followed by Herceptin maintenance for 1 year 2. Adjuvant radiation therapy 3. Followed by adjuvant antiestrogen therapy __________________________________________________________________________________________ Echo on 02/01/17:  55% -   60%    Patient is doing well today.  She will proceed with chemotherapy today.  I reviewed her chemotherapy plan with Dr. Lindi Adie, and verified that she will not receive Perjeta as she didn't have node positive HER-2 positive disease.  I also reviewed her WBC with Dr. Lindi Adie as they were elevated.  She is clear to proceed with treatment today.  I sent in her nausea medications today.  I reviewed them with her in detail and gave her a handout about what to take and when.  She and I reviewed her treatment plan today as well.     All questions were answered. The patient knows to call the clinic with any problems, questions or concerns. We can certainly see the patient much sooner if necessary.  A total of (30) minutes of face-to-face time was spent with this patient with greater than 50% of that time in counseling and care-coordination.  This note was electronically signed. Scot Dock, NP 02/16/2017

## 2017-02-16 NOTE — Patient Instructions (Signed)
[facility] Discharge Instructions for Patients Receiving Chemotherapy  Today you received the following chemotherapy agents Docetaxel, Herceptin, Carboplatin  To help prevent nausea and vomiting after your treatment, we encourage you to take your nausea medication as directed   If you develop nausea and vomiting that is not controlled by your nausea medication, call the clinic.   BELOW ARE SYMPTOMS THAT SHOULD BE REPORTED IMMEDIATELY:  *FEVER GREATER THAN 100.5 F  *CHILLS WITH OR WITHOUT FEVER  NAUSEA AND VOMITING THAT IS NOT CONTROLLED WITH YOUR NAUSEA MEDICATION  *UNUSUAL SHORTNESS OF BREATH  *UNUSUAL BRUISING OR BLEEDING  TENDERNESS IN MOUTH AND THROAT WITH OR WITHOUT PRESENCE OF ULCERS  *URINARY PROBLEMS  *BOWEL PROBLEMS  UNUSUAL RASH Items with * indicate a potential emergency and should be followed up as soon as possible.  Feel free to call the clinic should you have any questions or concerns. The clinic phone number is [phone].  Please show the [place] at check-in to the Emergency Department and triage nurse.  Trastuzumab injection for infusion What is this medicine? TRASTUZUMAB (tras TOO zoo mab) is a monoclonal antibody. It is used to treat breast cancer and stomach cancer. This medicine may be used for other purposes; ask your health care provider or pharmacist if you have questions. COMMON BRAND NAME(S): Herceptin What should I tell my health care provider before I take this medicine? They need to know if you have any of these conditions: -heart disease -heart failure -lung or breathing disease, like asthma -an unusual or allergic reaction to trastuzumab, benzyl alcohol, or other medications, foods, dyes, or preservatives -pregnant or trying to get pregnant -breast-feeding How should I use this medicine? This drug is given as an infusion into a vein. It is administered in a hospital or clinic by a specially trained health care  professional. Talk to your pediatrician regarding the use of this medicine in children. This medicine is not approved for use in children. Overdosage: If you think you have taken too much of this medicine contact a poison control center or emergency room at once. NOTE: This medicine is only for you. Do not share this medicine with others. What if I miss a dose? It is important not to miss a dose. Call your doctor or health care professional if you are unable to keep an appointment. What may interact with this medicine? This medicine may interact with the following medications: -certain types of chemotherapy, such as daunorubicin, doxorubicin, epirubicin, and idarubicin This list may not describe all possible interactions. Give your health care provider a list of all the medicines, herbs, non-prescription drugs, or dietary supplements you use. Also tell them if you smoke, drink alcohol, or use illegal drugs. Some items may interact with your medicine. What should I watch for while using this medicine? Visit your doctor for checks on your progress. Report any side effects. Continue your course of treatment even though you feel ill unless your doctor tells you to stop. Call your doctor or health care professional for advice if you get a fever, chills or sore throat, or other symptoms of a cold or flu. Do not treat yourself. Try to avoid being around people who are sick. You may experience fever, chills and shaking during your first infusion. These effects are usually mild and can be treated with other medicines. Report any side effects during the infusion to your health care professional. Fever and chills usually do not happen with later infusions. Do not become  pregnant while taking this medicine or for 7 months after stopping it. Women should inform their doctor if they wish to become pregnant or think they might be pregnant. Women of child-bearing potential will need to have a negative pregnancy test  before starting this medicine. There is a potential for serious side effects to an unborn child. Talk to your health care professional or pharmacist for more information. Do not breast-feed an infant while taking this medicine or for 7 months after stopping it. Women must use effective birth control with this medicine. What side effects may I notice from receiving this medicine? Side effects that you should report to your doctor or health care professional as soon as possible: -allergic reactions like skin rash, itching or hives, swelling of the face, lips, or tongue -chest pain or palpitations -cough -dizziness -feeling faint or lightheaded, falls -fever -general ill feeling or flu-like symptoms -signs of worsening heart failure like breathing problems; swelling in your legs and feet -unusually weak or tired Side effects that usually do not require medical attention (report to your doctor or health care professional if they continue or are bothersome): -bone pain -changes in taste -diarrhea -joint pain -nausea/vomiting -weight loss This list may not describe all possible side effects. Call your doctor for medical advice about side effects. You may report side effects to FDA at 1-800-FDA-1088. Where should I keep my medicine? This drug is given in a hospital or clinic and will not be stored at home. NOTE: This sheet is a summary. It may not cover all possible information. If you have questions about this medicine, talk to your doctor, pharmacist, or health care provider.  2018 Elsevier/Gold Standard (2016-03-03 14:37:52)   Docetaxel injection What is this medicine? DOCETAXEL (doe se TAX el) is a chemotherapy drug. It targets fast dividing cells, like cancer cells, and causes these cells to die. This medicine is used to treat many types of cancers like breast cancer, certain stomach cancers, head and neck cancer, lung cancer, and prostate cancer. This medicine may be used for other  purposes; ask your health care provider or pharmacist if you have questions. COMMON BRAND NAME(S): Docefrez, Taxotere What should I tell my health care provider before I take this medicine? They need to know if you have any of these conditions: -infection (especially a virus infection such as chickenpox, cold sores, or herpes) -liver disease -low blood counts, like low white cell, platelet, or red cell counts -an unusual or allergic reaction to docetaxel, polysorbate 80, other chemotherapy agents, other medicines, foods, dyes, or preservatives -pregnant or trying to get pregnant -breast-feeding How should I use this medicine? This drug is given as an infusion into a vein. It is administered in a hospital or clinic by a specially trained health care professional. Talk to your pediatrician regarding the use of this medicine in children. Special care may be needed. Overdosage: If you think you have taken too much of this medicine contact a poison control center or emergency room at once. NOTE: This medicine is only for you. Do not share this medicine with others. What if I miss a dose? It is important not to miss your dose. Call your doctor or health care professional if you are unable to keep an appointment. What may interact with this medicine? -cyclosporine -erythromycin -ketoconazole -medicines to increase blood counts like filgrastim, pegfilgrastim, sargramostim -vaccines Talk to your doctor or health care professional before taking any of these medicines: -acetaminophen -aspirin -ibuprofen -ketoprofen -naproxen This list may  not describe all possible interactions. Give your health care provider a list of all the medicines, herbs, non-prescription drugs, or dietary supplements you use. Also tell them if you smoke, drink alcohol, or use illegal drugs. Some items may interact with your medicine. What should I watch for while using this medicine? Your condition will be monitored  carefully while you are receiving this medicine. You will need important blood work done while you are taking this medicine. This drug may make you feel generally unwell. This is not uncommon, as chemotherapy can affect healthy cells as well as cancer cells. Report any side effects. Continue your course of treatment even though you feel ill unless your doctor tells you to stop. In some cases, you may be given additional medicines to help with side effects. Follow all directions for their use. Call your doctor or health care professional for advice if you get a fever, chills or sore throat, or other symptoms of a cold or flu. Do not treat yourself. This drug decreases your body's ability to fight infections. Try to avoid being around people who are sick. This medicine may increase your risk to bruise or bleed. Call your doctor or health care professional if you notice any unusual bleeding. This medicine may contain alcohol in the product. You may get drowsy or dizzy. Do not drive, use machinery, or do anything that needs mental alertness until you know how this medicine affects you. Do not stand or sit up quickly, especially if you are an older patient. This reduces the risk of dizzy or fainting spells. Avoid alcoholic drinks. Do not become pregnant while taking this medicine. Women should inform their doctor if they wish to become pregnant or think they might be pregnant. There is a potential for serious side effects to an unborn child. Talk to your health care professional or pharmacist for more information. Do not breast-feed an infant while taking this medicine. What side effects may I notice from receiving this medicine? Side effects that you should report to your doctor or health care professional as soon as possible: -allergic reactions like skin rash, itching or hives, swelling of the face, lips, or tongue -low blood counts - This drug may decrease the number of white blood cells, red blood cells  and platelets. You may be at increased risk for infections and bleeding. -signs of infection - fever or chills, cough, sore throat, pain or difficulty passing urine -signs of decreased platelets or bleeding - bruising, pinpoint red spots on the skin, black, tarry stools, nosebleeds -signs of decreased red blood cells - unusually weak or tired, fainting spells, lightheadedness -breathing problems -fast or irregular heartbeat -low blood pressure -mouth sores -nausea and vomiting -pain, swelling, redness or irritation at the injection site -pain, tingling, numbness in the hands or feet -swelling of the ankle, feet, hands -weight gain Side effects that usually do not require medical attention (report to your doctor or health care professional if they continue or are bothersome): -bone pain -complete hair loss including hair on your head, underarms, pubic hair, eyebrows, and eyelashes -diarrhea -excessive tearing -changes in the color of fingernails -loosening of the fingernails -nausea -muscle pain -red flush to skin -sweating -weak or tired This list may not describe all possible side effects. Call your doctor for medical advice about side effects. You may report side effects to FDA at 1-800-FDA-1088. Where should I keep my medicine? This drug is given in a hospital or clinic and will not be stored at home.  NOTE: This sheet is a summary. It may not cover all possible information. If you have questions about this medicine, talk to your doctor, pharmacist, or health care provider.  2018 Elsevier/Gold Standard (2015-04-11 12:32:56)   Carboplatin injection What is this medicine? CARBOPLATIN (KAR boe pla tin) is a chemotherapy drug. It targets fast dividing cells, like cancer cells, and causes these cells to die. This medicine is used to treat ovarian cancer and many other cancers. This medicine may be used for other purposes; ask your health care provider or pharmacist if you have  questions. COMMON BRAND NAME(S): Paraplatin What should I tell my health care provider before I take this medicine? They need to know if you have any of these conditions: -blood disorders -hearing problems -kidney disease -recent or ongoing radiation therapy -an unusual or allergic reaction to carboplatin, cisplatin, other chemotherapy, other medicines, foods, dyes, or preservatives -pregnant or trying to get pregnant -breast-feeding How should I use this medicine? This drug is usually given as an infusion into a vein. It is administered in a hospital or clinic by a specially trained health care professional. Talk to your pediatrician regarding the use of this medicine in children. Special care may be needed. Overdosage: If you think you have taken too much of this medicine contact a poison control center or emergency room at once. NOTE: This medicine is only for you. Do not share this medicine with others. What if I miss a dose? It is important not to miss a dose. Call your doctor or health care professional if you are unable to keep an appointment. What may interact with this medicine? -medicines for seizures -medicines to increase blood counts like filgrastim, pegfilgrastim, sargramostim -some antibiotics like amikacin, gentamicin, neomycin, streptomycin, tobramycin -vaccines Talk to your doctor or health care professional before taking any of these medicines: -acetaminophen -aspirin -ibuprofen -ketoprofen -naproxen This list may not describe all possible interactions. Give your health care provider a list of all the medicines, herbs, non-prescription drugs, or dietary supplements you use. Also tell them if you smoke, drink alcohol, or use illegal drugs. Some items may interact with your medicine. What should I watch for while using this medicine? Your condition will be monitored carefully while you are receiving this medicine. You will need important blood work done while you are  taking this medicine. This drug may make you feel generally unwell. This is not uncommon, as chemotherapy can affect healthy cells as well as cancer cells. Report any side effects. Continue your course of treatment even though you feel ill unless your doctor tells you to stop. In some cases, you may be given additional medicines to help with side effects. Follow all directions for their use. Call your doctor or health care professional for advice if you get a fever, chills or sore throat, or other symptoms of a cold or flu. Do not treat yourself. This drug decreases your body's ability to fight infections. Try to avoid being around people who are sick. This medicine may increase your risk to bruise or bleed. Call your doctor or health care professional if you notice any unusual bleeding. Be careful brushing and flossing your teeth or using a toothpick because you may get an infection or bleed more easily. If you have any dental work done, tell your dentist you are receiving this medicine. Avoid taking products that contain aspirin, acetaminophen, ibuprofen, naproxen, or ketoprofen unless instructed by your doctor. These medicines may hide a fever. Do not become pregnant while taking  this medicine. Women should inform their doctor if they wish to become pregnant or think they might be pregnant. There is a potential for serious side effects to an unborn child. Talk to your health care professional or pharmacist for more information. Do not breast-feed an infant while taking this medicine. What side effects may I notice from receiving this medicine? Side effects that you should report to your doctor or health care professional as soon as possible: -allergic reactions like skin rash, itching or hives, swelling of the face, lips, or tongue -signs of infection - fever or chills, cough, sore throat, pain or difficulty passing urine -signs of decreased platelets or bleeding - bruising, pinpoint red spots on the  skin, black, tarry stools, nosebleeds -signs of decreased red blood cells - unusually weak or tired, fainting spells, lightheadedness -breathing problems -changes in hearing -changes in vision -chest pain -high blood pressure -low blood counts - This drug may decrease the number of white blood cells, red blood cells and platelets. You may be at increased risk for infections and bleeding. -nausea and vomiting -pain, swelling, redness or irritation at the injection site -pain, tingling, numbness in the hands or feet -problems with balance, talking, walking -trouble passing urine or change in the amount of urine Side effects that usually do not require medical attention (report to your doctor or health care professional if they continue or are bothersome): -hair loss -loss of appetite -metallic taste in the mouth or changes in taste This list may not describe all possible side effects. Call your doctor for medical advice about side effects. You may report side effects to FDA at 1-800-FDA-1088. Where should I keep my medicine? This drug is given in a hospital or clinic and will not be stored at home. NOTE: This sheet is a summary. It may not cover all possible information. If you have questions about this medicine, talk to your doctor, pharmacist, or health care provider.  2018 Elsevier/Gold Standard (2007-06-14 14:38:05)

## 2017-02-16 NOTE — Assessment & Plan Note (Addendum)
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  Adjuvant chemotherapy with TCH followed by Herceptin maintenance for 1 year 2. Adjuvant radiation therapy 3. Followed by adjuvant antiestrogen therapy __________________________________________________________________________________________ Echo on 02/01/17:  55% -   60%   Patient is doing well today.  She will proceed with chemotherapy today.  I reviewed her chemotherapy plan with Dr. Lindi Adie, and verified that she will not receive Perjeta as she didn't have node positive HER-2 positive disease.  I also reviewed her WBC with Dr. Lindi Adie as they were elevated.  She is clear to proceed with treatment today.  I sent in her nausea medications today.  I reviewed them with her in detail and gave her a handout about what to take and when.  She and I reviewed her treatment plan today as well.

## 2017-02-16 NOTE — Telephone Encounter (Signed)
Gave patient Amanda Williams. Patient will receive update in MyChart.

## 2017-02-17 ENCOUNTER — Ambulatory Visit: Payer: 59

## 2017-02-17 ENCOUNTER — Telehealth: Payer: Self-pay

## 2017-02-17 DIAGNOSIS — M25611 Stiffness of right shoulder, not elsewhere classified: Secondary | ICD-10-CM

## 2017-02-17 DIAGNOSIS — M25612 Stiffness of left shoulder, not elsewhere classified: Secondary | ICD-10-CM

## 2017-02-17 DIAGNOSIS — M25512 Pain in left shoulder: Secondary | ICD-10-CM

## 2017-02-17 DIAGNOSIS — R293 Abnormal posture: Secondary | ICD-10-CM

## 2017-02-17 DIAGNOSIS — M25511 Pain in right shoulder: Secondary | ICD-10-CM

## 2017-02-17 DIAGNOSIS — M6281 Muscle weakness (generalized): Secondary | ICD-10-CM

## 2017-02-17 DIAGNOSIS — R600 Localized edema: Secondary | ICD-10-CM

## 2017-02-17 NOTE — Therapy (Signed)
[place], [place], [address] Phone: [phone]   Fax:  [phone]  Physical Therapy Treatment  Patient Details  Name: Amanda Williams MRN: [id number] Date of Birth: October 29, 1965 Referring Provider: Dr. Iran Planas   Encounter Date: 02/17/2017  PT End of Session - 02/17/17 1226    Visit Number  3    Number of Visits  9    Date for PT Re-Evaluation  03/08/17    PT Start Time  1111    PT Stop Time  1152    PT Time Calculation (min)  41 min    Activity Tolerance  Patient tolerated treatment well    Behavior During Therapy  [facility] for tasks assessed/performed       Past Medical History:  Diagnosis Date  . Allergy   . Breast cancer ([place])   . Difficult intubation    see 01/12/17 anesthesia record  . Family history of breast cancer   . Hematuria    ? trigonitis  . Panic attack    echo 2009 normal    Past Surgical History:  Procedure Laterality Date  . BREAST BIOPSY     left  . BREAST RECONSTRUCTION WITH PLACEMENT OF TISSUE EXPANDER AND FLEX HD (ACELLULAR HYDRATED DERMIS) Bilateral 01/12/2017   Procedure: BREAST RECONSTRUCTION WITH PLACEMENT OF TISSUE EXPANDER AND ALLODERM;  Surgeon: Irene Limbo, MD;  Location: [facility];  Service: Plastics;  Laterality: Bilateral;  . MASTECTOMY W/ SENTINEL NODE BIOPSY Bilateral 01/12/2017   Procedure: RIGHT MODIFIED RADICAL MASTECTOMY, LEFT TOTAL MASTECTOMY WITH LEFT SENTINEL LYMPH NODE BIOPSY;  Surgeon: Rolm Bookbinder, MD;  Location: [facility];  Service: General;  Laterality: Bilateral;  . PORTACATH PLACEMENT N/A 02/15/2017   Procedure: INSERTION PORT-A-CATH WITH [place];  Surgeon: Rolm Bookbinder, MD;  Location: [facility];  Service: General;  Laterality: N/A;    There were no vitals filed for this visit.  Subjective Assessment - 02/17/17 1113    Subjective  I got my port in Monday and my first chemo treatment was yesterday. So  far I'm feeling good and they added new nauseous meds for me and I have an onsite Neulasta on as well.     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 There are 3 masses on the left each measuring 1 cm located in vasrious quadrants. There are 4 masses on the right breast located in 3 quadrants measuring 2.2, 3.1, 2.5, and 4 cm. All masses that were biopsied are ER/PR positive and HER2 negative with a Ki67 of 10%. An axillary node on the right side was biopsied and found to be positive., 01/12/17- pt underwent bilateral mastectomies, she had drains removed about 2 weeks ago, pt wil have to undergo chemotherpy followed by radiation- she will begin radiaiton on 02/16/17     Patient Stated Goals  to know what kind of exercises to do to increase strength and flexibility, stamina    Currently in Pain?  No/denies                      [facility] Adult PT Treatment/Exercise - 02/17/17 0001      Shoulder Exercises: Supine   Horizontal ABduction  AROM;Both;10 reps Over towel roll    Other Supine Exercises  Bil scaption over towel roll 10 times      Shoulder Exercises: Pulleys   Flexion  2 minutes    Flexion Limitations  Pt returned correct therapist demo    ABduction  2 minutes    ABduction Limitations  VC and demo for corect UE positioning      Shoulder Exercises: Therapy Ball   Flexion  10 reps With forward lean into end of stretch    ABduction  5 reps Bil UE's with side lean into end of stretch      Manual Therapy   Manual Therapy  Passive ROM    Passive ROM  to bilateral shoulders in direction of flexion, abduction and ER to pt's tolerance             PT Education - 02/17/17 1246    Education provided  Yes    Education Details  Supine over towel roll for bil UE abd and scaption ("V") and how to bring arms into abduction with arms restin gon floor until gentle stetch at pecs felt (snow angel)    Person(s)  Educated  Patient    Methods  Explanation;Demonstration    Comprehension  Verbalized understanding;Returned demonstration           Breast Clinic Goals - 12/23/16 1622      Patient will be able to verbalize understanding of pertinent lymphedema risk reduction practices relevant to her diagnosis specifically related to skin care.   Time  1    Period  Days    Status  Achieved      Patient will be able to return demonstrate and/or verbalize understanding of the post-op home exercise program related to regaining shoulder range of motion.   Time  1    Period  Days    Status  Achieved      Patient will be able to verbalize understanding of the importance of attending the postoperative After Breast Cancer Class for further lymphedema risk reduction education and therapeutic exercise.   Time  1    Period  Days    Status  Achieved       Long Term Clinic Goals - 02/08/17 1154      CC Long Term Goal  #1   Title  Pt to be able to independently verbalize lymphedema risk reduction practices    Time  4    Period  Weeks    Status  New    Target Date  03/08/17      CC Long Term Goal  #2   Title  Pt to demonstrate 165 degrees of flexion bilaterally to allow pt to reach up overhead.    Baseline  R 109, L 116    Time  4    Period  Weeks    Status  New    Target Date  03/08/17      CC Long Term Goal  #3   Title  Pt to demonstrate 165 degrees of bilateral shoulder abduction to allow pt to reach out to sides to complete ADLs.    Baseline  R 84, L 105    Time  4    Period  Weeks    Status  New    Target Date  03/08/17      CC Long Term Goal  #4   Title  Pt to be independent in a home exercise program for continued strengthening and stretching.    Time  4    Period  Weeks    Status  New    Target Date  03/08/17      CC Long Term Goal  #5   Title  Pt  to report a 75% improvement in swelling in bilateral trunk to allow improved comfort.    Time  4    Period  Weeks    Status  New       CC Long Term Goal  #6   Title  Pt to report a 75% improvement in tightness across chest with bilateral shoulder ROM to allow improved comfort.     Time  4    Period  Weeks    Status  New    Target Date  03/08/17      Additional Goals   Additional Goals  Yes         Plan - 02/17/17 1228    Clinical Impression Statement  Continued with P/ROM today, though done gently on Rt side as pt just had port placed on [place], though she repotrs no pain or tenderness here today. Progressed pt to include A/ROM supine over towel roll and AA/ROM with the pulleys and ball on wall which she tolerated very well. Pt has been doing supine dowel exercises without problems except held off last 2 days due to port placement. She reported feeling good after session today, looser and no increased pain.     Rehab Potential  Good    Clinical Impairments Affecting Rehab Potential  pt to begin chemo followed by radiation    PT Frequency  2x / week    PT Duration  4 weeks    PT Treatment/Interventions  ADLs/Self Care Home Management;Therapeutic exercise;Therapeutic activities;Patient/family education;Orthotic Fit/Training;Manual lymph drainage;Manual techniques;Scar mobilization;Passive range of motion;Taping    PT Next Visit Plan  see if pt signed up for ABC class on Dec 3, continue gentle AA/A/PROM to bilateral shoulders, cont pulleys and ball; progress to supine scapular series once port site has completely healed.     PT Home Exercise Plan  Post op shoulder ROM HEP, supine dowel    Consulted and Agree with Plan of Care  Patient       Patient will benefit from skilled therapeutic intervention in order to improve the following deficits and impairments:  Decreased range of motion, Impaired UE functional use, Pain, Decreased knowledge of precautions, Postural dysfunction, Increased edema, Decreased strength, Decreased scar mobility, Increased fascial restricitons  Visit Diagnosis: Stiffness of right shoulder, not  elsewhere classified  Shoulder stiffness, left  Acute pain of right shoulder  Acute pain of left shoulder  Muscle weakness (generalized)  Abnormal posture  Localized edema     Problem List Patient Active Problem List   Diagnosis Date Noted  . Bilateral breast cancer ([place]) 01/12/2017  . Genetic testing 01/05/2017  . Family history of breast cancer   . Malignant neoplasm of overlapping sites of right breast in female, estrogen receptor positive ([place]) 12/23/2016  . Malignant neoplasm of overlapping 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 breast ([place]) 12/18/2016  . Exposure to communicable disease 08/04/2012  . Counseling about travel 08/04/2012  . Throat symptom 04/12/2012  . Visit for preventive health examination 10/18/2010  . Dense breasts 10/18/2010  . Pelvic pressure in female 05/20/2010  . PANIC ATTACK 03/27/2008  . MICROSCOPIC HEMATURIA 03/27/2008  . PALPITATIONS 02/02/2007  . ALLERGIC RHINITIS 10/07/2006    Otelia Limes, PTA 02/17/2017, 12:48 PM  [place], [place], [address] Phone: [phone]   Fax:  [phone]  Name: Amanda Williams MRN: [id number] Date of Birth: 05-14-1965

## 2017-02-17 NOTE — Telephone Encounter (Signed)
RN completed 1st time chemo follow-up call with patient. Patient reports having retrieved all medications. See flowsheet for details. Denies concerns or questions at this time. Verbalized "so far, so good".

## 2017-02-18 ENCOUNTER — Encounter: Payer: Self-pay | Admitting: Hematology and Oncology

## 2017-02-18 ENCOUNTER — Encounter (HOSPITAL_COMMUNITY): Payer: Self-pay | Admitting: General Surgery

## 2017-02-18 ENCOUNTER — Ambulatory Visit: Payer: 59 | Admitting: Physical Therapy

## 2017-02-18 DIAGNOSIS — M25611 Stiffness of right shoulder, not elsewhere classified: Secondary | ICD-10-CM

## 2017-02-18 DIAGNOSIS — M25612 Stiffness of left shoulder, not elsewhere classified: Secondary | ICD-10-CM

## 2017-02-18 NOTE — Therapy (Signed)
[place], [place], [address] Phone: [phone]   Fax:  [phone]  Physical Therapy Treatment  Patient Details  Name: Amanda Williams MRN: [id number] Date of Birth: 1965/05/15 Referring Provider: Dr. Iran Planas   Encounter Date: 02/18/2017  PT End of Session - 02/18/17 1707    Visit Number  4    Number of Visits  9    Date for PT Re-Evaluation  03/08/17    PT Start Time  1611    PT Stop Time  1657    PT Time Calculation (min)  46 min    Activity Tolerance  Patient tolerated treatment well    Behavior During Therapy  [facility] for tasks assessed/performed       Past Medical History:  Diagnosis Date  . Allergy   . Breast cancer ([place])   . Difficult intubation    see 01/12/17 anesthesia record  . Family history of breast cancer   . Hematuria    ? trigonitis  . Panic attack    echo 2009 normal    Past Surgical History:  Procedure Laterality Date  . BREAST BIOPSY     left  . BREAST RECONSTRUCTION WITH PLACEMENT OF TISSUE EXPANDER AND FLEX HD (ACELLULAR HYDRATED DERMIS) Bilateral 01/12/2017   Procedure: BREAST RECONSTRUCTION WITH PLACEMENT OF TISSUE EXPANDER AND ALLODERM;  Surgeon: Irene Limbo, MD;  Location: [facility];  Service: Plastics;  Laterality: Bilateral;  . MASTECTOMY W/ SENTINEL NODE BIOPSY Bilateral 01/12/2017   Procedure: RIGHT MODIFIED RADICAL MASTECTOMY, LEFT TOTAL MASTECTOMY WITH LEFT SENTINEL LYMPH NODE BIOPSY;  Surgeon: Rolm Bookbinder, MD;  Location: [facility];  Service: General;  Laterality: Bilateral;  . PORTACATH PLACEMENT N/A 02/15/2017   Procedure: INSERTION PORT-A-CATH WITH [place];  Surgeon: Rolm Bookbinder, MD;  Location: [facility];  Service: General;  Laterality: N/A;    There were no vitals filed for this visit.  Subjective Assessment - 02/18/17 1613    Subjective  I have some heartburn--I started chemo Tuesday. Is unable to go to ABC  class this Monday due to a conflict, nor the next session, as she will be out of town. Asks about the compression sleeve prescription; she reports she had ALND on right and 5 nodes removed on the left.     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 There are 3 masses on the left each measuring 1 cm located in vasrious quadrants. There are 4 masses on the right breast located in 3 quadrants measuring 2.2, 3.1, 2.5, and 4 cm. All masses that were biopsied are ER/PR positive and HER2 negative with a Ki67 of 10%. An axillary node on the right side was biopsied and found to be positive., 01/12/17- pt underwent bilateral mastectomies, she had drains removed about 2 weeks ago, pt wil have to undergo chemotherpy followed by radiation- she will begin radiaiton on 02/16/17     Currently in Pain?  No/denies                      [facility] Adult PT Treatment/Exercise - 02/18/17 0001      Shoulder Exercises: Pulleys   Flexion  2 minutes      Shoulder Exercises: Therapy Ball   Flexion  10 reps With forward lean into end of stretch      Shoulder Exercises: Stretch   Wall Stretch - ABduction  5 reps with finger ladder,  each side done    Wall Stretch - ABduction Limitations  #23 with right arm, 27 with left      Manual Therapy   Manual Therapy  Myofascial release    Myofascial Release  gentle myofascial pulling to each UE with movement into abduction; crosshands on left in diagonal from upper outer chest to abdomen, on right in vertical from axilla to right flank and from right upper arm to right flank; also unidirectional release at right upper axilla    Passive ROM  to bilateral shoulders in direction of flexion, abduction and ER to pt's tolerance             PT Education - 02/17/17 1246    Education provided  Yes    Education Details  Supine over towel roll for bil UE abd and scaption ("V") and how to bring arms  into abduction with arms restin gon floor until gentle stetch at pecs felt (snow angel)    Person(s) Educated  Patient    Methods  Explanation;Demonstration    Comprehension  Verbalized understanding;Returned demonstration           Breast Clinic Goals - 12/23/16 1622      Patient will be able to verbalize understanding of pertinent lymphedema risk reduction practices relevant to her diagnosis specifically related to skin care.   Time  1    Period  Days    Status  Achieved      Patient will be able to return demonstrate and/or verbalize understanding of the post-op home exercise program related to regaining shoulder range of motion.   Time  1    Period  Days    Status  Achieved      Patient will be able to verbalize understanding of the importance of attending the postoperative After Breast Cancer Class for further lymphedema risk reduction education and therapeutic exercise.   Time  1    Period  Days    Status  Achieved       Long Term Clinic Goals - 02/08/17 1154      CC Long Term Goal  #1   Title  Pt to be able to independently verbalize lymphedema risk reduction practices    Time  4    Period  Weeks    Status  New    Target Date  03/08/17      CC Long Term Goal  #2   Title  Pt to demonstrate 165 degrees of flexion bilaterally to allow pt to reach up overhead.    Baseline  R 109, L 116    Time  4    Period  Weeks    Status  New    Target Date  03/08/17      CC Long Term Goal  #3   Title  Pt to demonstrate 165 degrees of bilateral shoulder abduction to allow pt to reach out to sides to complete ADLs.    Baseline  R 84, L 105    Time  4    Period  Weeks    Status  New    Target Date  03/08/17      CC Long Term Goal  #4   Title  Pt to be independent in a home exercise program for continued strengthening and stretching.    Time  4    Period  Weeks    Status  New    Target Date  03/08/17      CC Long Term Goal  #  5   Title  Pt to report a 75% improvement in  swelling in bilateral trunk to allow improved comfort.    Time  4    Period  Weeks    Status  New      CC Long Term Goal  #6   Title  Pt to report a 75% improvement in tightness across chest with bilateral shoulder ROM to allow improved comfort.     Time  4    Period  Weeks    Status  New    Target Date  03/08/17      Additional Goals   Additional Goals  Yes         Plan - 02/18/17 1707    Clinical Impression Statement  Pt. is doing well two days post chemo and three days post having port placed.  Continued focus on ROM today, adding myofascial release to AA and P/ROM to both shoulders.    Rehab Potential  Good    Clinical Impairments Affecting Rehab Potential  pt. started chemo 02/16/17    PT Frequency  2x / week    PT Duration  4 weeks    PT Treatment/Interventions  ADLs/Self Care Home Management;Therapeutic exercise;Therapeutic activities;Patient/family education;Orthotic Fit/Training;Manual lymph drainage;Manual techniques;Scar mobilization;Passive range of motion;Taping    PT Next Visit Plan  Check to see if compression sleeve prescriptions were returned. Check goals. Continue gentle AA/A/PROM to bilateral shoulders, cont pulleys and ball; progress to supine scapular series once port site has completely healed.     PT Home Exercise Plan  Post op shoulder ROM HEP, supine dowel    Recommended Other Services  Suggested to patient that she call to get an appointment at [facility] for next week, saying that we should have Rx by then.    Consulted and Agree with Plan of Care  Patient       Patient will benefit from skilled therapeutic intervention in order to improve the following deficits and impairments:  Decreased range of motion, Impaired UE functional use, Pain, Decreased knowledge of precautions, Postural dysfunction, Increased edema, Decreased strength, Decreased scar mobility, Increased fascial restricitons  Visit Diagnosis: Stiffness of right shoulder, not elsewhere  classified  Shoulder stiffness, left     Problem List Patient Active Problem List   Diagnosis Date Noted  . Bilateral breast cancer ([place]) 01/12/2017  . Genetic testing 01/05/2017  . Family history of breast cancer   . Malignant neoplasm of overlapping sites of right breast in female, estrogen receptor positive ([place]) 12/23/2016  . Malignant neoplasm of overlapping 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 breast ([place]) 12/18/2016  . Exposure to communicable disease 08/04/2012  . Counseling about travel 08/04/2012  . Throat symptom 04/12/2012  . Visit for preventive health examination 10/18/2010  . Dense breasts 10/18/2010  . Pelvic pressure in female 05/20/2010  . PANIC ATTACK 03/27/2008  . MICROSCOPIC HEMATURIA 03/27/2008  . PALPITATIONS 02/02/2007  . ALLERGIC RHINITIS 10/07/2006    SALISBURY,DONNA 02/18/2017, 5:12 PM  [place], [place], [address] Phone: [phone]   Fax:  [phone]  Name: Amanda Williams MRN: [id number] Date of Birth: 1966-02-15  Serafina Royals, PT 02/18/17 5:12 PM

## 2017-02-19 NOTE — Anesthesia Postprocedure Evaluation (Signed)
Anesthesia Post Note  Patient: Amanda Williams  Procedure(s) Performed: INSERTION PORT-A-CATH WITH [place] (N/A Neck)     Patient location during evaluation: PACU Anesthesia Type: General Level of consciousness: awake and alert Pain management: pain level controlled Vital Signs Assessment: post-procedure vital signs reviewed and stable Respiratory status: spontaneous breathing, nonlabored ventilation, respiratory function stable and patient connected to nasal cannula oxygen Cardiovascular status: blood pressure returned to baseline and stable Postop Assessment: no apparent nausea or vomiting Anesthetic complications: no    Last Vitals:  Vitals:   02/15/17 1000 02/15/17 1008  BP: (!) 114/58 (!) 97/52  Pulse: 64 61  Resp: 18 (!) 23  Temp: (!) 36.3 C   SpO2: 95% 95%    Last Pain:  Vitals:   02/15/17 0609  TempSrc: Oral                 Effie Berkshire

## 2017-02-22 ENCOUNTER — Telehealth: Payer: Self-pay

## 2017-02-22 ENCOUNTER — Other Ambulatory Visit: Payer: Self-pay

## 2017-02-22 DIAGNOSIS — Z170 Estrogen receptor positive status [ER+]: Secondary | ICD-10-CM

## 2017-02-22 DIAGNOSIS — C50812 Malignant neoplasm of overlapping 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 breast: Secondary | ICD-10-CM

## 2017-02-22 NOTE — Telephone Encounter (Signed)
Pt called triage over the weekend for some nausea issues. Returned call today to check on pt. She is feeling better with nausea. I instructed her to take her zofran as prescribed if nausea continues. She complained of diarrhea as well. Instructed her to take immodium as prescribed for that and to let us know if that doesn't work. She is to return tomorrow for labs and f/u. Pt requested a flush appt for labs to be drawn via port. Appt was placed. Pt aware. No further questions at this time.  Cyndia Bent RN

## 2017-02-23 ENCOUNTER — Ambulatory Visit (HOSPITAL_BASED_OUTPATIENT_CLINIC_OR_DEPARTMENT_OTHER): Payer: 59 | Admitting: Hematology and Oncology

## 2017-02-23 ENCOUNTER — Ambulatory Visit: Payer: 59 | Attending: Plastic Surgery

## 2017-02-23 ENCOUNTER — Ambulatory Visit (HOSPITAL_BASED_OUTPATIENT_CLINIC_OR_DEPARTMENT_OTHER): Payer: 59

## 2017-02-23 ENCOUNTER — Other Ambulatory Visit (HOSPITAL_BASED_OUTPATIENT_CLINIC_OR_DEPARTMENT_OTHER): Payer: 59

## 2017-02-23 VITALS — BP 142/83 | HR 75 | Temp 98.2°F | Resp 18 | Ht 66.0 in | Wt 146.6 lb

## 2017-02-23 DIAGNOSIS — M898X9 Other specified disorders of bone, unspecified site: Secondary | ICD-10-CM

## 2017-02-23 DIAGNOSIS — M25511 Pain in right shoulder: Secondary | ICD-10-CM | POA: Insufficient documentation

## 2017-02-23 DIAGNOSIS — M25612 Stiffness of left shoulder, not elsewhere classified: Secondary | ICD-10-CM

## 2017-02-23 DIAGNOSIS — M25611 Stiffness of right shoulder, not elsewhere classified: Secondary | ICD-10-CM | POA: Diagnosis not present

## 2017-02-23 DIAGNOSIS — C50811 Malignant neoplasm of overlapping sites of right female breast: Secondary | ICD-10-CM | POA: Diagnosis not present

## 2017-02-23 DIAGNOSIS — R293 Abnormal posture: Secondary | ICD-10-CM

## 2017-02-23 DIAGNOSIS — R600 Localized edema: Secondary | ICD-10-CM | POA: Diagnosis present

## 2017-02-23 DIAGNOSIS — Z170 Estrogen receptor positive status [ER+]: Secondary | ICD-10-CM

## 2017-02-23 DIAGNOSIS — C50812 Malignant neoplasm of overlapping 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 breast: Secondary | ICD-10-CM

## 2017-02-23 DIAGNOSIS — M6281 Muscle weakness (generalized): Secondary | ICD-10-CM | POA: Insufficient documentation

## 2017-02-23 DIAGNOSIS — K219 Gastro-esophageal reflux disease without esophagitis: Secondary | ICD-10-CM

## 2017-02-23 DIAGNOSIS — C50011 Malignant neoplasm of nipple and areola, right female breast: Secondary | ICD-10-CM

## 2017-02-23 DIAGNOSIS — R530 Neoplastic (malignant) related fatigue: Secondary | ICD-10-CM | POA: Diagnosis not present

## 2017-02-23 DIAGNOSIS — R110 Nausea: Secondary | ICD-10-CM

## 2017-02-23 DIAGNOSIS — C50012 Malignant neoplasm of nipple and areola, left female breast: Principal | ICD-10-CM

## 2017-02-23 DIAGNOSIS — M25512 Pain in left shoulder: Secondary | ICD-10-CM | POA: Insufficient documentation

## 2017-02-23 LAB — CBC WITH DIFFERENTIAL/PLATELET
BASO%: 0.3 % (ref 0.0–2.0)
BASOS ABS: 0.1 10*3/uL (ref 0.0–0.1)
EOS ABS: 0 10*3/uL (ref 0.0–0.5)
EOS%: 0 % (ref 0.0–7.0)
HCT: 31.8 % — ABNORMAL LOW (ref 34.8–46.6)
HGB: 10.4 g/dL — ABNORMAL LOW (ref 11.6–15.9)
LYMPH%: 23 % (ref 14.0–49.7)
MCH: 29.4 pg (ref 25.1–34.0)
MCHC: 32.9 g/dL (ref 31.5–36.0)
MCV: 89.4 fL (ref 79.5–101.0)
MONO#: 2.7 10*3/uL — AB (ref 0.1–0.9)
MONO%: 8.6 % (ref 0.0–14.0)
NEUT%: 68.1 % (ref 38.4–76.8)
NEUTROS ABS: 21.6 10*3/uL — AB (ref 1.5–6.5)
PLATELETS: 214 10*3/uL (ref 145–400)
RBC: 3.55 10*6/uL — AB (ref 3.70–5.45)
RDW: 12.5 % (ref 11.2–14.5)
WBC: 31.8 10*3/uL — AB (ref 3.9–10.3)
lymph#: 7.3 10*3/uL — ABNORMAL HIGH (ref 0.9–3.3)

## 2017-02-23 LAB — COMPREHENSIVE METABOLIC PANEL
ALT: 14 U/L (ref 0–55)
AST: 20 U/L (ref 5–34)
Albumin: 3.5 g/dL (ref 3.5–5.0)
Alkaline Phosphatase: 76 U/L (ref 40–150)
Anion Gap: 9 mEq/L (ref 3–11)
BUN: 9.5 mg/dL (ref 7.0–26.0)
CALCIUM: 8.5 mg/dL (ref 8.4–10.4)
CHLORIDE: 106 meq/L (ref 98–109)
CO2: 25 meq/L (ref 22–29)
CREATININE: 0.9 mg/dL (ref 0.6–1.1)
EGFR: >60 mL/min/{1.73_m2} (ref >60)
GLUCOSE: 125 mg/dL (ref 70–140)
POTASSIUM: 3.2 meq/L — AB (ref 3.5–5.1)
SODIUM: 140 meq/L (ref 136–145)
Total Bilirubin: <0.22 mg/dL (ref 0.20–1.20)
Total Protein: 6.5 g/dL (ref 6.4–8.3)

## 2017-02-23 MED ORDER — HEPARIN SOD (PORK) LOCK FLUSH 100 UNIT/ML IV SOLN
250.0000 [IU] | Freq: Once | INTRAVENOUS | Status: AC
  Administered 2017-02-23: 16:00:00
  Filled 2017-02-23: qty 5

## 2017-02-23 MED ORDER — SODIUM CHLORIDE 0.9% FLUSH
10.0000 mL | Freq: Once | INTRAVENOUS | Status: AC
  Administered 2017-02-23: 10 mL
  Filled 2017-02-23: qty 10

## 2017-02-23 NOTE — Therapy (Signed)
[place], [place], [address] Phone: [phone]   Fax:  [phone]  Physical Therapy Treatment  Patient Details  Name: Amanda Williams MRN: [id number] Date of Birth: 05-27-1965 Referring Provider: Dr. Iran Planas   Encounter Date: 02/23/2017  PT End of Session - 02/23/17 1032    Visit Number  5    Number of Visits  9    Date for PT Re-Evaluation  03/08/17    PT Start Time  1022    PT Stop Time  1102    PT Time Calculation (min)  40 min    Activity Tolerance  Patient tolerated treatment well    Behavior During Therapy  [facility] for tasks assessed/performed       Past Medical History:  Diagnosis Date  . Allergy   . Breast cancer ([place])   . Difficult intubation    see 01/12/17 anesthesia record  . Family history of breast cancer   . Hematuria    ? trigonitis  . Panic attack    echo 2009 normal    Past Surgical History:  Procedure Laterality Date  . BREAST BIOPSY     left  . BREAST RECONSTRUCTION WITH PLACEMENT OF TISSUE EXPANDER AND FLEX HD (ACELLULAR HYDRATED DERMIS) Bilateral 01/12/2017   Procedure: BREAST RECONSTRUCTION WITH PLACEMENT OF TISSUE EXPANDER AND ALLODERM;  Surgeon: Irene Limbo, MD;  Location: [place];  Service: Plastics;  Laterality: Bilateral;  . MASTECTOMY W/ SENTINEL NODE BIOPSY Bilateral 01/12/2017   Procedure: RIGHT MODIFIED RADICAL MASTECTOMY, LEFT TOTAL MASTECTOMY WITH LEFT SENTINEL LYMPH NODE BIOPSY;  Surgeon: Rolm Bookbinder, MD;  Location: [place];  Service: General;  Laterality: Bilateral;  . PORTACATH PLACEMENT N/A 02/15/2017   Procedure: INSERTION PORT-A-CATH WITH [place];  Surgeon: Rolm Bookbinder, MD;  Location: [place];  Service: General;  Laterality: N/A;    There were no vitals filed for this visit.  Subjective Assessment - 02/23/17 1025    Subjective  Had a really rough weekend with nauesous, not being able to eat much and  just generally not feeling well. But I'm doing a little better today and feel like I could eat something. I have lab work later today and haven't removed the gauze from my port site yet, so they can deal with that. I didn't get to exercise this weekend but my shoulders are feeling pretty good today.     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 There are 3 masses on the left each measuring 1 cm located in vasrious quadrants. There are 4 masses on the right breast located in 3 quadrants measuring 2.2, 3.1, 2.5, and 4 cm. All masses that were biopsied are ER/PR positive and HER2 negative with a Ki67 of 10%. An axillary node on the right side was biopsied and found to be positive., 01/12/17- pt underwent bilateral mastectomies, she had drains removed about 2 weeks ago, pt wil have to undergo chemotherpy followed by radiation- she will begin radiaiton on 02/16/17     Patient Stated Goals  to know what kind of exercises to do to increase strength and flexibility, stamina    Currently in Pain?  No/denies         [facility] PT Assessment - 02/23/17 0001      AROM   Right Shoulder Extension  46 Degrees    Right Shoulder Flexion  132 Degrees    Right Shoulder ABduction  107 Degrees    Right Shoulder Internal Rotation  61 Degrees    Right Shoulder External Rotation  83 Degrees    Left Shoulder Extension  57 Degrees    Left Shoulder Flexion  132 Degrees    Left Shoulder ABduction  109 Degrees    Left Shoulder Internal Rotation  71 Degrees    Left Shoulder External Rotation  82 Degrees                  [facility] Adult PT Treatment/Exercise - 02/23/17 0001      Shoulder Exercises: Pulleys   Flexion  2 minutes    ABduction  2 minutes    ABduction Limitations  Pt with good technique today      Shoulder Exercises: Therapy Ball   Flexion  10 reps With forward lean into end of stretch      Shoulder Exercises: Stretch   Wall  Stretch - ABduction  5 reps with finger ladder, bil     Wall Stretch - ABduction Limitations  #27 with Rt arm, #28 with Lt      Manual Therapy   Manual Therapy  Myofascial release;Passive ROM    Myofascial Release  gentle myofascial pulling to each UE with movement into abduction; crosshands on left in diagonal from upper outer chest to abdomen, on right in vertical from axilla to right flank and from right upper arm to right flank; also unidirectional release at right upper axilla    Passive ROM  to bilateral shoulders in direction of flexion, abduction and ER to pt's tolerance                   Breast Clinic Goals - 12/23/16 1622      Patient will be able to verbalize understanding of pertinent lymphedema risk reduction practices relevant to her diagnosis specifically related to skin care.   Time  1    Period  Days    Status  Achieved      Patient will be able to return demonstrate and/or verbalize understanding of the post-op home exercise program related to regaining shoulder range of motion.   Time  1    Period  Days    Status  Achieved      Patient will be able to verbalize understanding of the importance of attending the postoperative After Breast Cancer Class for further lymphedema risk reduction education and therapeutic exercise.   Time  1    Period  Days    Status  Achieved       Long Term Clinic Goals - 02/23/17 1103      CC Long Term Goal  #1   Title  Pt to be able to independently verbalize lymphedema risk reduction practices    Baseline  Pt hopes to attend ABC class in future    Status  On-going      CC Long Term Goal  #2   Title  Pt to demonstrate 165 degrees of flexion bilaterally to allow pt to reach up overhead.    Baseline  R 109, L 116; Bil 132 degress today-02/23/17    Status  On-going      CC Long Term Goal  #3   Title  Pt to demonstrate 165 degrees of bilateral shoulder abduction to allow pt to reach out to sides to complete ADLs.     Baseline  R 84, L 105; Rt 107 and Lt 109 degrees-02/23/17    Status  On-going  CC Long Term Goal  #4   Title  Pt to be independent in a home exercise program for continued strengthening and stretching.    Status  On-going      CC Long Term Goal  #5   Title  Pt to report a 75% improvement in swelling in bilateral trunk to allow improved comfort.    Status  On-going      CC Long Term Goal  #6   Title  Pt to report a 75% improvement in tightness across chest with bilateral shoulder ROM to allow improved comfort.     Baseline  This some limited at Rt side as pt had port placement last week-02/23/17    Status  On-going         Plan - 02/23/17 1039    Clinical Impression Statement  Issued script for compression garments today and pt knows to call [facility] to get measured. All her A/ROM measurements have imporved since eavluation and pt reports noticing she can do more now, like reach to close her car door and reach behind her head. Overall pt is pkeased with ehr progress thus far.    Rehab Potential  Good    Clinical Impairments Affecting Rehab Potential  pt. started chemo 02/16/17    PT Frequency  2x / week    PT Duration  4 weeks    PT Treatment/Interventions  ADLs/Self Care Home Management;Therapeutic exercise;Therapeutic activities;Patient/family education;Orthotic Fit/Training;Manual lymph drainage;Manual techniques;Scar mobilization;Passive range of motion;Taping    PT Next Visit Plan  Continue gentle AA/A/PROM to bilateral shoulders, cont pulleys and ball; progress to supine scapular series once port site has completely healed.     PT Home Exercise Plan  Post op shoulder ROM HEP, supine dowel    Consulted and Agree with Plan of Care  Patient       Patient will benefit from skilled therapeutic intervention in order to improve the following deficits and impairments:  Decreased range of motion, Impaired UE functional use, Pain, Decreased knowledge of precautions, Postural  dysfunction, Increased edema, Decreased strength, Decreased scar mobility, Increased fascial restricitons  Visit Diagnosis: Stiffness of right shoulder, not elsewhere classified  Shoulder stiffness, left  Acute pain of right shoulder  Acute pain of left shoulder  Muscle weakness (generalized)  Abnormal posture  Localized edema     Problem List Patient Active Problem List   Diagnosis Date Noted  . Bilateral breast cancer ([place]) 01/12/2017  . Genetic testing 01/05/2017  . Family history of breast cancer   . Malignant neoplasm of overlapping sites of right breast in female, estrogen receptor positive ([place]) 12/23/2016  . Malignant neoplasm of overlapping 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 breast ([place]) 12/18/2016  . Exposure to communicable disease 08/04/2012  . Counseling about travel 08/04/2012  . Throat symptom 04/12/2012  . Visit for preventive health examination 10/18/2010  . Dense breasts 10/18/2010  . Pelvic pressure in female 05/20/2010  . PANIC ATTACK 03/27/2008  . MICROSCOPIC HEMATURIA 03/27/2008  . PALPITATIONS 02/02/2007  . ALLERGIC RHINITIS 10/07/2006    Amanda Williams, PTA 02/23/2017, 11:06 AM  Amanda Williams, [place], [address] Phone: [phone]   Fax:  [phone]  Name: Amanda Williams MRN: [id number] Date of Birth: [birth year]

## 2017-02-23 NOTE — Progress Notes (Signed)
Patient Care Team: Panosh, Standley Brooking, MD as PCP - General Delsa Bern, MD (Obstetrics and Gynecology) Excell Seltzer, MD as Consulting Physician (General Surgery) Nicholas Lose, MD as Consulting Physician (Hematology and Oncology) Kyung Rudd, MD as Consulting Physician (Radiation Oncology)  DIAGNOSIS:  Encounter Diagnoses  Name Primary?  . Malignant neoplasm of overlapping sites of right breast in female, estrogen receptor positive ([place]) Yes  . Malignant neoplasm of overlapping sites of left breast in female, estrogen receptor positive ([place])     SUMMARY OF ONCOLOGIC HISTORY:   Malignant neoplasm of overlapping 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 breast ([place])   01/12/2017 Surgery    Left mastectomy: IDC with DCIS, 2 tumors 3.6 cm ER 90%, PR 95%, Ki-67 5%, HER-2 negative ratio 1.32) and 1.2 cm (ER 95% PR 95% positive Her 2 Positive ratio 3.2, Ki-67 5%) T2 N0 stage Ib      02/16/2017 -  Chemotherapy    TCH x6 followed by Herceptin maintenance        Malignant neoplasm of overlapping sites of right breast in female, estrogen receptor positive ([place])   12/21/2016 Initial Diagnosis    Palpable lumps in the right breast with nipple inversion: Mammogram revealed skin thickening and distortion, ultrasound revealed 4 masses 3.1 cm at 11:00, 2.5 cm at 6:00, 4 cm at 8:00, 2.2 cm at 5:00 with 2 abnormal axillary lymph nodes: MRI revealed 7 cm abnormality on right breast, in addition 3.6 cm abnormality in the left breast and 2 additional masses 1 cm each; right breast: T4 N1 stage III B clinical stage; left breast: T2 N0 stage IB clinical stage      12/21/2016 Pathology Results    Right breast: Grade 1 Invasive lobular cancer, lymph node also positive, ER 70%, PR 90%, Ki-67 10%, HER-2 negative ratio 1.09 Left breast: Grade 1 IDC with DCIS prognostic panel pending      01/04/2017 Genetic Testing    The patient had genetic testing due to a personal history of bilateral breast caner and a family history  of breast cancer.  The Multi-Cancer Panel was ordered. The Multi-Cancer Panel offered by [facility] includes sequencing and/or deletion duplication testing of the following 83 genes: ALK, APC, ATM, AXIN2,BAP1,  BARD1, BLM, BMPR1A, BRCA1, BRCA2, BRIP1, CASR, CDC73, CDH1, CDK4, CDKN1B, CDKN1C, CDKN2A (p14ARF), CDKN2A (p16INK4a), CEBPA, CHEK2, CTNNA1, DICER1, DIS3L2, EGFR (c.2369C>T, p.Thr790Met variant only), EPCAM (Deletion/duplication testing only), FH, FLCN, GATA2, GPC3, GREM1 (Promoter region deletion/duplication testing only), HOXB13 (c.251G>A, p.Gly84Glu), HRAS, KIT, MAX, MEN1, MET, MITF (c.952G>A, p.Glu318Lys variant only), MLH1, MSH2, MSH3, MSH6, MUTYH, NBN, NF1, NF2, NTHL1, PALB2, PDGFRA, PHOX2B, PMS2, POLD1, POLE, POT1, PRKAR1A, PTCH1, PTEN, RAD50, RAD51C, RAD51D, RB1, RECQL4, RET, RUNX1, SDHAF2, SDHA (sequence changes only), SDHB, SDHC, SDHD, SMAD4, SMARCA4, SMARCB1, SMARCE1, STK11, SUFU, TERC, TERT, TMEM127, TP53, TSC1, TSC2, VHL, WRN and WT1.   Results: Negative, no pathogenic variants identified.  The date of this test report is 01/04/2017.        01/12/2017 Surgery    Right mastectomy: Invasive and in situ lobular carcinoma involves the dermis of the skin of the nipple, margins negative, 3/3 lymph nodes positive, grade 1, EF 75%, PR 95%, HER-2 negative ratio 1.19, Ki-67 10%, T3N1A stage IIa       CHIEF COMPLIANT: Cycle 1 day 8 TCH  INTERVAL HISTORY: Amanda Williams is a 51 year old with above-mentioned his bilateral breast cancers treated with bilateral mastectomies.  She is currently on adjuvant chemotherapy today cycle 1 day 8.  She tolerated chemotherapy fairly  well.  Day for 2 days 6 she felt fatigued as well as mild nausea and acid reflux along with bone pain related to Neulasta.  REVIEW OF SYSTEMS:   Constitutional: Denies fevers, chills or abnormal weight loss Eyes: Denies blurriness of vision Ears, nose, mouth, throat, and face: Denies mucositis or sore throat Respiratory: Denies  cough, dyspnea or wheezes Cardiovascular: Denies palpitation, chest discomfort Gastrointestinal:  Denies nausea, heartburn or change in bowel habits Skin: Denies abnormal skin rashes Lymphatics: Denies new lymphadenopathy or easy bruising Neurological:Denies numbness, tingling or new weaknesses Behavioral/Psych: Mood is stable, no new changes  Extremities: No lower extremity edema Breast: Bilateral mastectomies All other systems were reviewed with the patient and are negative.  I have reviewed the past medical history, past surgical history, social history and family history with the patient and they are unchanged from previous note.  ALLERGIES:  is allergic to penicillins; sulfonamide derivatives; and adhesive [tape].  MEDICATIONS:  Current Outpatient Medications  Medication Sig Dispense Refill  . acetaminophen (TYLENOL) 500 MG tablet Take 1,000 mg 2 (two) times daily as needed by mouth for moderate pain.    Marland Kitchen dexamethasone (DECADRON) 4 MG tablet Take 1 tablet (4 mg total) by mouth daily. Take 1 tablet a day before chemo and 1 tablet a day after 12 tablet 0  . ibuprofen (ADVIL,MOTRIN) 200 MG tablet Take 400 mg 2 (two) times daily as needed by mouth for moderate pain.    Marland Kitchen lidocaine-prilocaine (EMLA) cream Apply to affected area once 30 g 3  . LORazepam (ATIVAN) 0.5 MG tablet Take 1 tablet (0.5 mg total) by mouth every 6 (six) hours as needed (Nausea or vomiting). 30 tablet 0  . ondansetron (ZOFRAN) 8 MG tablet Take 1 tablet (8 mg total) by mouth 2 (two) times daily as needed for refractory nausea / vomiting. Start on day 3 after chemo. 30 tablet 1  . oxyCODONE (OXY IR/ROXICODONE) 5 MG immediate release tablet Take 5 mg at bedtime as needed by mouth for pain.    Marland Kitchen prochlorperazine (COMPAZINE) 10 MG tablet Take 1 tablet (10 mg total) by mouth every 6 (six) hours as needed (Nausea or vomiting). 30 tablet 1  . sertraline (ZOLOFT) 100 MG tablet Take 100 mg every evening by mouth.      No  current facility-administered medications for this visit.     PHYSICAL EXAMINATION: ECOG PERFORMANCE STATUS: 1 - Symptomatic but completely ambulatory  Vitals:   02/23/17 1547  BP: (!) 142/83  Pulse: 75  Resp: 18  Temp: 98.2 F (36.8 C)  SpO2: 100%   Filed Weights   02/23/17 1547  Weight: 146 lb 9.6 oz (66.5 kg)    GENERAL:alert, no distress and comfortable SKIN: skin color, texture, turgor are normal, no rashes or significant lesions EYES: normal, Conjunctiva are pink and non-injected, sclera clear OROPHARYNX:no exudate, no erythema and lips, buccal mucosa, and tongue normal  NECK: supple, thyroid normal size, non-tender, without nodularity LYMPH:  no palpable lymphadenopathy in the cervical, axillary or inguinal LUNGS: clear to auscultation and percussion with normal breathing effort HEART: regular rate & rhythm and no murmurs and no lower extremity edema ABDOMEN:abdomen soft, non-tender and normal bowel sounds MUSCULOSKELETAL:no cyanosis of digits and no clubbing  NEURO: alert & oriented x 3 with fluent speech, no focal motor/sensory deficits EXTREMITIES: No lower extremity edema  LABORATORY DATA:  I have reviewed the data as listed   Chemistry      Component Value Date/Time   NA 140 02/23/2017  1459   K 3.2 (L) 02/23/2017 1459   CL 101 05/12/2016 0820   CO2 25 02/23/2017 1459   BUN 9.5 02/23/2017 1459   CREATININE 0.9 02/23/2017 1459      Component Value Date/Time   CALCIUM 8.5 02/23/2017 1459   ALKPHOS 76 02/23/2017 1459   AST 20 02/23/2017 1459   ALT 14 02/23/2017 1459   BILITOT <0.22 02/23/2017 1459       Lab Results  Component Value Date   WBC 31.8 (H) 02/23/2017   HGB 10.4 (L) 02/23/2017   HCT 31.8 (L) 02/23/2017   MCV 89.4 02/23/2017   PLT 214 02/23/2017   NEUTROABS 21.6 (H) 02/23/2017    ASSESSMENT & PLAN:  Malignant neoplasm of overlapping 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 breast ([place]) 01/12/2017: Left mastectomy: IDC with DCIS, 2 tumors 3.6 cm ER 90%,  PR 95%, Ki-67 5%, HER-2 negative ratio 1.32) and 1.2 cm (ER 95% PR 95% positive Her 2 Positive ratio 3.2, Ki-67 5%) T2 N0 stage Ib Right mastectomy: Invasive and in situ lobular carcinoma involves the dermis of the skin of the nipple, margins negative, 3/3 lymph nodes positive, grade 1, EF 75%, PR 95%, HER-2 negative ratio 1.19, Ki-67 10%, T3N1A stage IIa  Treatment plan: 1.  Adjuvant chemotherapy with TCH followed by Herceptin maintenance for 1 year 2. Adjuvant radiation therapy 3. Followed by adjuvant antiestrogen therapy __________________________________________________________________________________________ Echo on 02/01/17:  55% -   60%   Current treatment: Cycle 1 day 8 TCH Chemo toxicities: 1.  Fatigue 2. nausea grade 1 3.  Acid reflux 4.  Bone pain related to Neulasta in spite of Claritin I discussed with her that today her blood counts have normalized in fact they have gone above normal.  We will discuss if Neulasta can be omitted with next cycle onwards  Return to clinic after her cruise vacation to receive cycle 2    I spent 25 minutes talking to the patient of which more than half was spent in counseling and coordination of care.  Orders Placed This Encounter  Procedures  . Ferritin    Standing Status:   Future    Standing Expiration Date:   02/23/2018  . Iron and TIBC    Standing Status:   Future    Standing Expiration Date:   02/23/2018  . Folate, Serum    Standing Status:   Future    Standing Expiration Date:   02/23/2018  . Vitamin B12    Standing Status:   Future    Standing Expiration Date:   02/23/2018   The patient has a good understanding of the overall plan. she agrees with it. she will call with any problems that may develop before the next visit here.   Rulon Eisenmenger, MD 02/23/17

## 2017-02-23 NOTE — Assessment & Plan Note (Signed)
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  Adjuvant chemotherapy with TCH followed by Herceptin maintenance for 1 year 2. Adjuvant radiation therapy 3. Followed by adjuvant antiestrogen therapy __________________________________________________________________________________________ Echo on 02/01/17:  55% -   60%  Current treatment: Cycle 1 day 8 TCH Chemo toxicities: 1.  Fatigue 2. nausea grade 1 3.  Acid reflux 4.  Bone pain related to Neulasta in spite of Claritin I discussed with her that today her blood counts have normalized in fact they have gone above normal.  We will discuss if Neulasta can be omitted with next cycle onwards  Return to clinic after her cruise vacation to receive cycle 2

## 2017-02-24 ENCOUNTER — Other Ambulatory Visit: Payer: Self-pay | Admitting: Hematology and Oncology

## 2017-03-01 ENCOUNTER — Encounter: Payer: 59 | Admitting: Physical Therapy

## 2017-03-02 ENCOUNTER — Encounter: Payer: 59 | Admitting: Physical Therapy

## 2017-03-03 ENCOUNTER — Ambulatory Visit: Payer: 59

## 2017-03-03 DIAGNOSIS — M25512 Pain in left shoulder: Secondary | ICD-10-CM

## 2017-03-03 DIAGNOSIS — M25611 Stiffness of right shoulder, not elsewhere classified: Secondary | ICD-10-CM | POA: Diagnosis not present

## 2017-03-03 DIAGNOSIS — M25511 Pain in right shoulder: Secondary | ICD-10-CM

## 2017-03-03 DIAGNOSIS — R293 Abnormal posture: Secondary | ICD-10-CM

## 2017-03-03 DIAGNOSIS — M25612 Stiffness of left shoulder, not elsewhere classified: Secondary | ICD-10-CM

## 2017-03-03 NOTE — Therapy (Signed)
[place], [place], [address] Phone: [phone]   Fax:  [phone]  Physical Therapy Treatment  Patient Details  Name: Amanda Williams MRN: [id number] Date of Birth: 11/16/1965 Referring Provider: Dr. Iran Planas   Encounter Date: 03/03/2017  PT End of Session - 03/03/17 1105    Visit Number  6    Number of Visits  9    Date for PT Re-Evaluation  03/08/17    PT Start Time  1016    PT Stop Time  1104    PT Time Calculation (min)  48 min    Activity Tolerance  Patient tolerated treatment well    Behavior During Therapy  [facility] for tasks assessed/performed       Past Medical History:  Diagnosis Date  . Allergy   . Breast cancer ([place])   . Difficult intubation    see 01/12/17 anesthesia record  . Family history of breast cancer   . Hematuria    ? trigonitis  . Panic attack    echo 2009 normal    Past Surgical History:  Procedure Laterality Date  . BREAST BIOPSY     left  . BREAST RECONSTRUCTION WITH PLACEMENT OF TISSUE EXPANDER AND FLEX HD (ACELLULAR HYDRATED DERMIS) Bilateral 01/12/2017   Procedure: BREAST RECONSTRUCTION WITH PLACEMENT OF TISSUE EXPANDER AND ALLODERM;  Surgeon: Irene Limbo, MD;  Location: [facility];  Service: Plastics;  Laterality: Bilateral;  . MASTECTOMY W/ SENTINEL NODE BIOPSY Bilateral 01/12/2017   Procedure: RIGHT MODIFIED RADICAL MASTECTOMY, LEFT TOTAL MASTECTOMY WITH LEFT SENTINEL LYMPH NODE BIOPSY;  Surgeon: Rolm Bookbinder, MD;  Location: [facility];  Service: General;  Laterality: Bilateral;  . PORTACATH PLACEMENT N/A 02/15/2017   Procedure: INSERTION PORT-A-CATH WITH [place];  Surgeon: Rolm Bookbinder, MD;  Location: [facility];  Service: General;  Laterality: N/A;    There were no vitals filed for this visit.  Subjective Assessment - 03/03/17 1020    Subjective  My shoulders are feeling good and I'm feeling good. I tried on alot of  shirts yesterday for my upcoming cruise and that is really good PT! My next chemo is the week of Christmas after I get back from my cruise.     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 There are 3 masses on the left each measuring 1 cm located in vasrious quadrants. There are 4 masses on the right breast located in 3 quadrants measuring 2.2, 3.1, 2.5, and 4 cm. All masses that were biopsied are ER/PR positive and HER2 negative with a Ki67 of 10%. An axillary node on the right side was biopsied and found to be positive., 01/12/17- pt underwent bilateral mastectomies, she had drains removed about 2 weeks ago, pt wil have to undergo chemotherpy followed by radiation- she will begin radiaiton on 02/16/17     Patient Stated Goals  to know what kind of exercises to do to increase strength and flexibility, stamina    Currently in Pain?  No/denies         [facility] PT Assessment - 03/03/17 0001      AROM   Right Shoulder Extension  60 Degrees    Right Shoulder Flexion  152 Degrees    Right Shoulder ABduction  173 Degrees    Right Shoulder Internal Rotation  60 Degrees    Right Shoulder External Rotation  90 Degrees    Left Shoulder Extension  55 Degrees    Left Shoulder Flexion  145 Degrees    Left Shoulder ABduction  168 Degrees    Left Shoulder Internal Rotation  68 Degrees    Left Shoulder External Rotation  87 Degrees                  [facility] Adult PT Treatment/Exercise - 03/03/17 0001      Shoulder Exercises: Pulleys   Flexion  2 minutes    ABduction  2 minutes      Shoulder Exercises: Therapy Ball   Flexion  10 reps With forward lean into end of stretch      Shoulder Exercises: Stretch   Wall Stretch - ABduction  5 reps With finger ladder, bil UE's    Wall Stretch - ABduction Limitations  #28 with bil UE's today      Manual Therapy   Manual Therapy  Myofascial release;Passive ROM    Myofascial Release   gentle myofascial pulling to each UE with movement into abduction; crosshands on left in diagonal from upper outer chest to abdomen, on right in vertical from axilla to right flank and from right upper arm to right flank; also unidirectional release at right upper axilla    Passive ROM  to bilateral shoulders in direction of flexion, abduction and D2 to pt's tolerance                   Breast Clinic Goals - 12/23/16 1622      Patient will be able to verbalize understanding of pertinent lymphedema risk reduction practices relevant to her diagnosis specifically related to skin care.   Time  1    Period  Days    Status  Achieved      Patient will be able to return demonstrate and/or verbalize understanding of the post-op home exercise program related to regaining shoulder range of motion.   Time  1    Period  Days    Status  Achieved      Patient will be able to verbalize understanding of the importance of attending the postoperative After Breast Cancer Class for further lymphedema risk reduction education and therapeutic exercise.   Time  1    Period  Days    Status  Achieved       Long Term Clinic Goals - 03/03/17 1210      CC Long Term Goal  #1   Title  Pt to be able to independently verbalize lymphedema risk reduction practices    Baseline  Pt hopes to attend ABC class in future    Status  On-going      CC Long Term Goal  #2   Title  Pt to demonstrate 165 degrees of flexion bilaterally to allow pt to reach up overhead.    Baseline  R 109, L 116; Bil 132 degress today-02/23/17; Rt 152 and Lt 145 degrees-03/03/17    Status  On-going      CC Long Term Goal  #3   Title  Pt to demonstrate 165 degrees of bilateral shoulder abduction to allow pt to reach out to sides to complete ADLs.    Baseline  R 84, L 105; Rt 107 and Lt 109 degrees-02/23/17; Rt 173 and Lt 168-03/03/17    Status  Achieved      CC Long Term Goal  #4   Title  Pt to be independent in a home exercise  program for continued strengthening and stretching.    Status  Partially Met      CC Long Term Goal  #5   Title  Pt to report a 75% improvement in swelling in bilateral trunk to allow improved comfort.    Status  On-going      CC Long Term Goal  #6   Title  Pt to report a 75% improvement in tightness across chest with bilateral shoulder ROM to allow improved comfort.     Baseline  This some limited at Rt side as pt had port placement last week-02/23/17; pt reports feeling very minimal tightness here now with ROM though did not rate it-03/03/17    Status  Partially Met         Plan - 03/03/17 1105    Clinical Impression Statement  Pt has made excellent progress thus far with ROM and has been measured for compression sleeves. She goes out of town for a week next week and would like to come for at least one more visit after she returns for assess.     Rehab Potential  Good    Clinical Impairments Affecting Rehab Potential  pt. started chemo 02/16/17    PT Frequency  2x / week    PT Duration  4 weeks    PT Treatment/Interventions  ADLs/Self Care Home Management;Therapeutic exercise;Therapeutic activities;Patient/family education;Orthotic Fit/Training;Manual lymph drainage;Manual techniques;Scar mobilization;Passive range of motion;Taping    PT Next Visit Plan  Assess goals (instruct in lymphedema risk reduction handout if she isn't going to attend ABC class) and ROM next visit; potential D/C if pt doing well. If not will need a renewal.    Consulted and Agree with Plan of Care  Patient       Patient will benefit from skilled therapeutic intervention in order to improve the following deficits and impairments:  Decreased range of motion, Impaired UE functional use, Pain, Decreased knowledge of precautions, Postural dysfunction, Increased edema, Decreased strength, Decreased scar mobility, Increased fascial restricitons  Visit Diagnosis: Stiffness of right shoulder, not elsewhere  classified  Shoulder stiffness, left  Acute pain of right shoulder  Acute pain of left shoulder  Abnormal posture     Problem List Patient Active Problem List   Diagnosis Date Noted  . Bilateral breast cancer ([place]) 01/12/2017  . Genetic testing 01/05/2017  . Family history of breast cancer   . Malignant neoplasm of overlapping sites of right breast in female, estrogen receptor positive ([place]) 12/23/2016  . Malignant neoplasm of overlapping 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 breast ([place]) 12/18/2016  . Exposure to communicable disease 08/04/2012  . Counseling about travel 08/04/2012  . Throat symptom 04/12/2012  . Visit for preventive health examination 10/18/2010  . Dense breasts 10/18/2010  . Pelvic pressure in female 05/20/2010  . PANIC ATTACK 03/27/2008  . MICROSCOPIC HEMATURIA 03/27/2008  . PALPITATIONS 02/02/2007  . ALLERGIC RHINITIS 10/07/2006    Otelia Limes, PTA 03/03/2017, 12:18 PM  [place], [place], [address] Phone: [phone]   Fax:  [phone]  Name: Amanda Williams MRN: [id number] Date of Birth: August 21, 1965

## 2017-03-04 ENCOUNTER — Other Ambulatory Visit: Payer: Self-pay

## 2017-03-04 DIAGNOSIS — Z170 Estrogen receptor positive status [ER+]: Principal | ICD-10-CM

## 2017-03-04 DIAGNOSIS — C50011 Malignant neoplasm of nipple and areola, right female breast: Secondary | ICD-10-CM

## 2017-03-04 DIAGNOSIS — C50012 Malignant neoplasm of nipple and areola, left female breast: Principal | ICD-10-CM

## 2017-03-04 MED ORDER — PROCHLORPERAZINE MALEATE 10 MG PO TABS: 10.0000 mg | ORAL_TABLET | Freq: Four times a day (QID) | ORAL | 2 refills | Status: DC | PRN

## 2017-03-05 ENCOUNTER — Other Ambulatory Visit: Payer: Self-pay

## 2017-03-09 ENCOUNTER — Ambulatory Visit: Payer: 59

## 2017-03-09 ENCOUNTER — Other Ambulatory Visit: Payer: 59

## 2017-03-09 ENCOUNTER — Ambulatory Visit: Payer: 59 | Admitting: Hematology and Oncology

## 2017-03-16 NOTE — Assessment & Plan Note (Signed)
01/12/2017:Left mastectomy: IDC with DCIS, 2 tumors 3.6 cm ER 90%, PR 95%, Ki-67 5%, HER-2 negative ratio 1.32) and 1.2 cm (ER 95% PR 95% positive Her 2 Positive ratio 3.2, Ki-67 5%) T2 N0 stage Ib Right mastectomy: Invasive and in situ lobular carcinoma involves the dermis of the skin of the nipple, margins negative, 3/3 lymph nodes positive, grade 1, EF 75%, PR 95%, HER-2 negative ratio 1.19, Ki-67 10%, T3N1A stage IIa  Treatment plan: 1.  Adjuvant chemotherapy with TCH followed by Herceptin maintenance for 1 year 2. Adjuvant radiation therapy 3. Followed by adjuvant antiestrogen therapy __________________________________________________________________________________________ Echo on 02/01/17:  55% - 60%   Current treatment: Cycle 2 day 1 TCH Chemo toxicities: 1.  Fatigue 2. nausea grade 1 3.  Acid reflux 4.  Bone pain related to Neulasta in spite of Claritin   We will discuss if Neulasta can be omitted with next cycle onwards  Patient returned from a cruise vacation. RTC in 3 weeks for cycle 3

## 2017-03-17 ENCOUNTER — Other Ambulatory Visit: Payer: Self-pay | Admitting: *Deleted

## 2017-03-17 ENCOUNTER — Ambulatory Visit (HOSPITAL_BASED_OUTPATIENT_CLINIC_OR_DEPARTMENT_OTHER): Payer: 59 | Admitting: Hematology and Oncology

## 2017-03-17 ENCOUNTER — Ambulatory Visit: Payer: 59

## 2017-03-17 ENCOUNTER — Telehealth: Payer: Self-pay | Admitting: Hematology and Oncology

## 2017-03-17 ENCOUNTER — Other Ambulatory Visit (HOSPITAL_BASED_OUTPATIENT_CLINIC_OR_DEPARTMENT_OTHER): Payer: 59

## 2017-03-17 ENCOUNTER — Ambulatory Visit (HOSPITAL_BASED_OUTPATIENT_CLINIC_OR_DEPARTMENT_OTHER): Payer: 59

## 2017-03-17 DIAGNOSIS — C50811 Malignant neoplasm of overlapping sites of right female breast: Secondary | ICD-10-CM

## 2017-03-17 DIAGNOSIS — K219 Gastro-esophageal reflux disease without esophagitis: Secondary | ICD-10-CM

## 2017-03-17 DIAGNOSIS — C50919 Malignant neoplasm of unspecified site of unspecified female breast: Secondary | ICD-10-CM

## 2017-03-17 DIAGNOSIS — M898X9 Other specified disorders of bone, unspecified site: Secondary | ICD-10-CM

## 2017-03-17 DIAGNOSIS — Z5111 Encounter for antineoplastic chemotherapy: Secondary | ICD-10-CM | POA: Diagnosis not present

## 2017-03-17 DIAGNOSIS — Z170 Estrogen receptor positive status [ER+]: Secondary | ICD-10-CM

## 2017-03-17 DIAGNOSIS — C50812 Malignant neoplasm of overlapping 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 breast: Secondary | ICD-10-CM

## 2017-03-17 DIAGNOSIS — R110 Nausea: Secondary | ICD-10-CM

## 2017-03-17 DIAGNOSIS — C50012 Malignant neoplasm of nipple and areola, left female breast: Secondary | ICD-10-CM

## 2017-03-17 DIAGNOSIS — D6481 Anemia due to antineoplastic chemotherapy: Secondary | ICD-10-CM

## 2017-03-17 DIAGNOSIS — Z5112 Encounter for antineoplastic immunotherapy: Secondary | ICD-10-CM

## 2017-03-17 DIAGNOSIS — C773 Secondary and unspecified malignant neoplasm of axilla and upper limb lymph nodes: Secondary | ICD-10-CM

## 2017-03-17 DIAGNOSIS — R530 Neoplastic (malignant) related fatigue: Secondary | ICD-10-CM | POA: Diagnosis not present

## 2017-03-17 DIAGNOSIS — C50011 Malignant neoplasm of nipple and areola, right female breast: Secondary | ICD-10-CM

## 2017-03-17 LAB — CBC WITH DIFFERENTIAL/PLATELET
BASO%: 0.4 % (ref 0.0–2.0)
Basophils Absolute: 0 10*3/uL (ref 0.0–0.1)
EOS%: 0 % (ref 0.0–7.0)
Eosinophils Absolute: 0 10*3/uL (ref 0.0–0.5)
HEMATOCRIT: 31.2 % — AB (ref 34.8–46.6)
HEMOGLOBIN: 10 g/dL — AB (ref 11.6–15.9)
LYMPH#: 0.7 10*3/uL — AB (ref 0.9–3.3)
LYMPH%: 15.2 % (ref 14.0–49.7)
MCH: 28.8 pg (ref 25.1–34.0)
MCHC: 32.1 g/dL (ref 31.5–36.0)
MCV: 89.7 fL (ref 79.5–101.0)
MONO#: 0.1 10*3/uL (ref 0.1–0.9)
MONO%: 1.5 % (ref 0.0–14.0)
NEUT#: 3.6 10*3/uL (ref 1.5–6.5)
NEUT%: 82.9 % — ABNORMAL HIGH (ref 38.4–76.8)
Platelets: 245 10*3/uL (ref 145–400)
RBC: 3.47 10*6/uL — ABNORMAL LOW (ref 3.70–5.45)
RDW: 13.7 % (ref 11.2–14.5)
WBC: 4.3 10*3/uL (ref 3.9–10.3)

## 2017-03-17 LAB — COMPREHENSIVE METABOLIC PANEL
ALBUMIN: 3.6 g/dL (ref 3.5–5.0)
ALK PHOS: 70 U/L (ref 40–150)
ALT: 20 U/L (ref 0–55)
ANION GAP: 11 meq/L (ref 3–11)
AST: 23 U/L (ref 5–34)
BILIRUBIN TOTAL: 0.34 mg/dL (ref 0.20–1.20)
BUN: 13 mg/dL (ref 7.0–26.0)
CALCIUM: 8.5 mg/dL (ref 8.4–10.4)
CO2: 22 mEq/L (ref 22–29)
Chloride: 107 mEq/L (ref 98–109)
Creatinine: 0.9 mg/dL (ref 0.6–1.1)
Glucose: 172 mg/dl — ABNORMAL HIGH (ref 70–140)
Potassium: 4 mEq/L (ref 3.5–5.1)
Sodium: 140 mEq/L (ref 136–145)
TOTAL PROTEIN: 6.9 g/dL (ref 6.4–8.3)

## 2017-03-17 LAB — IRON AND TIBC
%SAT: 10 % — ABNORMAL LOW (ref 21–57)
IRON: 36 ug/dL — AB (ref 41–142)
TIBC: 359 ug/dL (ref 236–444)
UIBC: 323 ug/dL (ref 120–384)

## 2017-03-17 LAB — FERRITIN: Ferritin: 25 ng/ml (ref 9–269)

## 2017-03-17 MED ORDER — LORAZEPAM 0.5 MG PO TABS: 0.5000 mg | ORAL_TABLET | Freq: Four times a day (QID) | ORAL | 0 refills | Status: DC | PRN

## 2017-03-17 MED ORDER — PALONOSETRON HCL INJECTION 0.25 MG/5ML
INTRAVENOUS | Status: AC
  Filled 2017-03-17: qty 5

## 2017-03-17 MED ORDER — SODIUM CHLORIDE 0.9 % IV SOLN
75.0000 mg/m2 | Freq: Once | INTRAVENOUS | Status: AC
  Administered 2017-03-17: 130 mg via INTRAVENOUS
  Filled 2017-03-17: qty 13

## 2017-03-17 MED ORDER — PALONOSETRON HCL INJECTION 0.25 MG/5ML
0.2500 mg | Freq: Once | INTRAVENOUS | Status: AC
  Administered 2017-03-17: 0.25 mg via INTRAVENOUS

## 2017-03-17 MED ORDER — HEPARIN SOD (PORK) LOCK FLUSH 100 UNIT/ML IV SOLN
500.0000 [IU] | Freq: Once | INTRAVENOUS | Status: AC | PRN
  Administered 2017-03-17: 500 [IU]
  Filled 2017-03-17: qty 5

## 2017-03-17 MED ORDER — DEXAMETHASONE SODIUM PHOSPHATE 10 MG/ML IJ SOLN
INTRAMUSCULAR | Status: AC
  Filled 2017-03-17: qty 1

## 2017-03-17 MED ORDER — PEGFILGRASTIM 6 MG/0.6ML ~~LOC~~ PSKT
6.0000 mg | PREFILLED_SYRINGE | Freq: Once | SUBCUTANEOUS | Status: AC
  Administered 2017-03-17: 6 mg via SUBCUTANEOUS

## 2017-03-17 MED ORDER — DEXAMETHASONE SODIUM PHOSPHATE 10 MG/ML IJ SOLN
10.0000 mg | Freq: Once | INTRAMUSCULAR | Status: AC
  Administered 2017-03-17: 10 mg via INTRAVENOUS

## 2017-03-17 MED ORDER — CEPHALEXIN 500 MG PO CAPS: 500.0000 mg | ORAL_CAPSULE | Freq: Two times a day (BID) | ORAL | 0 refills | Status: DC

## 2017-03-17 MED ORDER — ACETAMINOPHEN 325 MG PO TABS
ORAL_TABLET | ORAL | Status: AC
  Filled 2017-03-17: qty 2

## 2017-03-17 MED ORDER — SODIUM CHLORIDE 0.9 % IV SOLN
Freq: Once | INTRAVENOUS | Status: AC
  Administered 2017-03-17: 12:00:00 via INTRAVENOUS

## 2017-03-17 MED ORDER — TRASTUZUMAB CHEMO 150 MG IV SOLR
6.0000 mg/kg | Freq: Once | INTRAVENOUS | Status: AC
  Administered 2017-03-17: 399 mg via INTRAVENOUS
  Filled 2017-03-17: qty 19

## 2017-03-17 MED ORDER — DIPHENHYDRAMINE HCL 25 MG PO CAPS
ORAL_CAPSULE | ORAL | Status: AC
  Filled 2017-03-17: qty 2

## 2017-03-17 MED ORDER — PEGFILGRASTIM 6 MG/0.6ML ~~LOC~~ PSKT
PREFILLED_SYRINGE | SUBCUTANEOUS | Status: AC
  Filled 2017-03-17: qty 0.6

## 2017-03-17 MED ORDER — SODIUM CHLORIDE 0.9% FLUSH
10.0000 mL | Freq: Once | INTRAVENOUS | Status: AC
  Administered 2017-03-17: 10 mL
  Filled 2017-03-17: qty 10

## 2017-03-17 MED ORDER — SODIUM CHLORIDE 0.9% FLUSH
10.0000 mL | INTRAVENOUS | Status: DC | PRN
  Administered 2017-03-17: 10 mL
  Filled 2017-03-17: qty 10

## 2017-03-17 MED ORDER — DIPHENHYDRAMINE HCL 25 MG PO CAPS
50.0000 mg | ORAL_CAPSULE | Freq: Once | ORAL | Status: AC
  Administered 2017-03-17: 50 mg via ORAL

## 2017-03-17 MED ORDER — SODIUM CHLORIDE 0.9 % IV SOLN
618.6000 mg | Freq: Once | INTRAVENOUS | Status: AC
  Administered 2017-03-17: 620 mg via INTRAVENOUS
  Filled 2017-03-17: qty 62

## 2017-03-17 MED ORDER — ACETAMINOPHEN 325 MG PO TABS
650.0000 mg | ORAL_TABLET | Freq: Once | ORAL | Status: AC
  Administered 2017-03-17: 650 mg via ORAL

## 2017-03-17 NOTE — Progress Notes (Signed)
Patient Care Team: Panosh, Standley Brooking, MD as PCP - General Delsa Bern, MD (Obstetrics and Gynecology) Excell Seltzer, MD as Consulting Physician (General Surgery) Nicholas Lose, MD as Consulting Physician (Hematology and Oncology) Kyung Rudd, MD as Consulting Physician (Radiation Oncology)  DIAGNOSIS:  Encounter Diagnoses  Name Primary?  . Malignant neoplasm of overlapping sites of left breast in female, estrogen receptor positive ([place])   . Malignant neoplasm involving both nipple and areola in both breasts in female, estrogen receptor positive ([place])     SUMMARY OF ONCOLOGIC HISTORY:   Malignant neoplasm of overlapping 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 breast ([place])   01/12/2017 Surgery    Left mastectomy: IDC with DCIS, 2 tumors 3.6 cm ER 90%, PR 95%, Ki-67 5%, HER-2 negative ratio 1.32) and 1.2 cm (ER 95% PR 95% positive Her 2 Positive ratio 3.2, Ki-67 5%) T2 N0 stage Ib      02/16/2017 -  Chemotherapy    TCH x6 followed by Herceptin maintenance        Malignant neoplasm of overlapping sites of right breast in female, estrogen receptor positive ([place])   12/21/2016 Initial Diagnosis    Palpable lumps in the right breast with nipple inversion: Mammogram revealed skin thickening and distortion, ultrasound revealed 4 masses 3.1 cm at 11:00, 2.5 cm at 6:00, 4 cm at 8:00, 2.2 cm at 5:00 with 2 abnormal axillary lymph nodes: MRI revealed 7 cm abnormality on right breast, in addition 3.6 cm abnormality in the left breast and 2 additional masses 1 cm each; right breast: T4 N1 stage III B clinical stage; left breast: T2 N0 stage IB clinical stage      12/21/2016 Pathology Results    Right breast: Grade 1 Invasive lobular cancer, lymph node also positive, ER 70%, PR 90%, Ki-67 10%, HER-2 negative ratio 1.09 Left breast: Grade 1 IDC with DCIS prognostic panel pending      01/04/2017 Genetic Testing    The patient had genetic testing due to a personal history of bilateral breast caner and a  family history of breast cancer.  The Multi-Cancer Panel was ordered. The Multi-Cancer Panel offered by [facility] includes sequencing and/or deletion duplication testing of the following 83 genes: ALK, APC, ATM, AXIN2,BAP1,  BARD1, BLM, BMPR1A, BRCA1, BRCA2, BRIP1, CASR, CDC73, CDH1, CDK4, CDKN1B, CDKN1C, CDKN2A (p14ARF), CDKN2A (p16INK4a), CEBPA, CHEK2, CTNNA1, DICER1, DIS3L2, EGFR (c.2369C>T, p.Thr790Met variant only), EPCAM (Deletion/duplication testing only), FH, FLCN, GATA2, GPC3, GREM1 (Promoter region deletion/duplication testing only), HOXB13 (c.251G>A, p.Gly84Glu), HRAS, KIT, MAX, MEN1, MET, MITF (c.952G>A, p.Glu318Lys variant only), MLH1, MSH2, MSH3, MSH6, MUTYH, NBN, NF1, NF2, NTHL1, PALB2, PDGFRA, PHOX2B, PMS2, POLD1, POLE, POT1, PRKAR1A, PTCH1, PTEN, RAD50, RAD51C, RAD51D, RB1, RECQL4, RET, RUNX1, SDHAF2, SDHA (sequence changes only), SDHB, SDHC, SDHD, SMAD4, SMARCA4, SMARCB1, SMARCE1, STK11, SUFU, TERC, TERT, TMEM127, TP53, TSC1, TSC2, VHL, WRN and WT1.   Results: Negative, no pathogenic variants identified.  The date of this test report is 01/04/2017.        01/12/2017 Surgery    Right mastectomy: Invasive and in situ lobular carcinoma involves the dermis of the skin of the nipple, margins negative, 3/3 lymph nodes positive, grade 1, EF 75%, PR 95%, HER-2 negative ratio 1.19, Ki-67 10%, T3N1A stage IIa       CHIEF COMPLIANT: Cycle 2 TCH  INTERVAL HISTORY: Amanda Williams is a 51 year old with above-mentioned history of left breast cancer treated with left mastectomy and is here to receive her second cycle of chemotherapy with [place].  She complains of  fatigue as well as mild nausea related to chemo.  She also had Neulasta related bone pain.  Bone pain lasted for a week.  She is here today to discuss whether or not she needs to continue with the Neulasta.  REVIEW OF SYSTEMS:   Constitutional: Denies fevers, chills or abnormal weight loss Eyes: Denies blurriness of vision Ears, nose, mouth,  throat, and face: Denies mucositis or sore throat Respiratory: Denies cough, dyspnea or wheezes Cardiovascular: Denies palpitation, chest discomfort Gastrointestinal:  Denies nausea, heartburn or change in bowel habits Skin: Denies abnormal skin rashes Lymphatics: Denies new lymphadenopathy or easy bruising Neurological:Denies numbness, tingling or new weaknesses Behavioral/Psych: Mood is stable, no new changes  Extremities: No lower extremity edema  All other systems were reviewed with the patient and are negative.  I have reviewed the past medical history, past surgical history, social history and family history with the patient and they are unchanged from previous note.  ALLERGIES:  is allergic to penicillins; sulfonamide derivatives; and adhesive [tape].  MEDICATIONS:  Current Outpatient Medications  Medication Sig Dispense Refill  . acetaminophen (TYLENOL) 500 MG tablet Take 1,000 mg 2 (two) times daily as needed by mouth for moderate pain.    . cephALEXin (KEFLEX) 500 MG capsule Take 1 capsule (500 mg total) by mouth 2 (two) times daily. 14 capsule 0  . dexamethasone (DECADRON) 4 MG tablet Take 1 tablet (4 mg total) by mouth daily. Take 1 tablet a day before chemo and 1 tablet a day after 12 tablet 0  . ibuprofen (ADVIL,MOTRIN) 200 MG tablet Take 400 mg 2 (two) times daily as needed by mouth for moderate pain.    Marland Kitchen lidocaine-prilocaine (EMLA) cream Apply to affected area once 30 g 3  . LORazepam (ATIVAN) 0.5 MG tablet Take 1 tablet (0.5 mg total) by mouth every 6 (six) hours as needed (Nausea or vomiting). 30 tablet 0  . ondansetron (ZOFRAN) 8 MG tablet Take 1 tablet (8 mg total) by mouth 2 (two) times daily as needed for refractory nausea / vomiting. Start on day 3 after chemo. 30 tablet 1  . prochlorperazine (COMPAZINE) 10 MG tablet Take 1 tablet (10 mg total) by mouth every 6 (six) hours as needed (Nausea or vomiting). 30 tablet 2  . sertraline (ZOLOFT) 100 MG tablet Take 100 mg  every evening by mouth.      No current facility-administered medications for this visit.     PHYSICAL EXAMINATION: ECOG PERFORMANCE STATUS: 1 - Symptomatic but completely ambulatory  Vitals:   03/17/17 1043  BP: (!) 182/81  Pulse: 86  Resp: 17  Temp: 98.7 F (37.1 C)  SpO2: 98%   Filed Weights   03/17/17 1043  Weight: 151 lb 14.4 oz (68.9 kg)    GENERAL:alert, no distress and comfortable SKIN: skin color, texture, turgor are normal, no rashes or significant lesions EYES: normal, Conjunctiva are pink and non-injected, sclera clear OROPHARYNX:no exudate, no erythema and lips, buccal mucosa, and tongue normal  NECK: supple, thyroid normal size, non-tender, without nodularity LYMPH:  no palpable lymphadenopathy in the cervical, axillary or inguinal LUNGS: clear to auscultation and percussion with normal breathing effort HEART: regular rate & rhythm and no murmurs and no lower extremity edema ABDOMEN:abdomen soft, non-tender and normal bowel sounds MUSCULOSKELETAL:no cyanosis of digits and no clubbing  NEURO: alert & oriented x 3 with fluent speech, no focal motor/sensory deficits EXTREMITIES: No lower extremity edema  LABORATORY DATA:  I have reviewed the data as listed   Chemistry  Component Value Date/Time   NA 140 03/17/2017 1008   K 4.0 03/17/2017 1008   CL 101 05/12/2016 0820   CO2 22 03/17/2017 1008   BUN 13.0 03/17/2017 1008   CREATININE 0.9 03/17/2017 1008      Component Value Date/Time   CALCIUM 8.5 03/17/2017 1008   ALKPHOS 70 03/17/2017 1008   AST 23 03/17/2017 1008   ALT 20 03/17/2017 1008   BILITOT 0.34 03/17/2017 1008       Lab Results  Component Value Date   WBC 4.3 03/17/2017   HGB 10.0 (L) 03/17/2017   HCT 31.2 (L) 03/17/2017   MCV 89.7 03/17/2017   PLT 245 03/17/2017   NEUTROABS 3.6 03/17/2017    ASSESSMENT & PLAN:  Malignant neoplasm of overlapping 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 breast ([place]) 01/12/2017:Left mastectomy: IDC with DCIS, 2  tumors 3.6 cm ER 90%, PR 95%, Ki-67 5%, HER-2 negative ratio 1.32) and 1.2 cm (ER 95% PR 95% positive Her 2 Positive ratio 3.2, Ki-67 5%) T2 N0 stage Ib Right mastectomy: Invasive and in situ lobular carcinoma involves the dermis of the skin of the nipple, margins negative, 3/3 lymph nodes positive, grade 1, EF 75%, PR 95%, HER-2 negative ratio 1.19, Ki-67 10%, T3N1A stage IIa  Treatment plan: 1.  Adjuvant chemotherapy with TCH followed by Herceptin maintenance for 1 year 2. Adjuvant radiation therapy 3. Followed by adjuvant antiestrogen therapy __________________________________________________________________________________________ Echo on 02/01/17:  55% - 60%   Current treatment: Cycle 2 day 1 TCH Chemo toxicities: 1.  Fatigue 2. nausea grade 1 3.  Acid reflux 4.  Bone pain related to Neulasta in spite of Claritin 5.  Chemotherapy-induced anemia   Based on today's blood work, I recommended that she continue with Neulasta.  Patient returned from a cruise vacation and had a wonderful time. RTC in 3 weeks for cycle 3   I spent 25 minutes talking to the patient of which more than half was spent in counseling and coordination of care.  No orders of the defined types were placed in this encounter.  The patient has a good understanding of the overall plan. she agrees with it. she will call with any problems that may develop before the next visit here.   Harriette Ohara, MD 03/17/17

## 2017-03-17 NOTE — Patient Instructions (Signed)
[facility] Discharge Instructions for Patients Receiving Chemotherapy  Today you received the following chemotherapy agents Docetaxel, Herceptin, Carboplatin  To help prevent nausea and vomiting after your treatment, we encourage you to take your nausea medication as directed   If you develop nausea and vomiting that is not controlled by your nausea medication, call the clinic.   BELOW ARE SYMPTOMS THAT SHOULD BE REPORTED IMMEDIATELY:  *FEVER GREATER THAN 100.5 F  *CHILLS WITH OR WITHOUT FEVER  NAUSEA AND VOMITING THAT IS NOT CONTROLLED WITH YOUR NAUSEA MEDICATION  *UNUSUAL SHORTNESS OF BREATH  *UNUSUAL BRUISING OR BLEEDING  TENDERNESS IN MOUTH AND THROAT WITH OR WITHOUT PRESENCE OF ULCERS  *URINARY PROBLEMS  *BOWEL PROBLEMS  UNUSUAL RASH Items with * indicate a potential emergency and should be followed up as soon as possible.  Feel free to call the clinic should you have any questions or concerns. The clinic phone number is [phone].  Please show the [facility] at check-in to the Emergency Department and triage nurse.

## 2017-03-17 NOTE — Telephone Encounter (Signed)
No 12/26 los.  

## 2017-03-17 NOTE — Progress Notes (Signed)
Patient reports feeling well, "MY BP now that they take it in my legs is always high but I feel fine". NO thigh cuff present to retake correctly. MD already saw BP so I will not retake it at this time.

## 2017-03-18 LAB — FOLATE: FOLATE: 19.1 ng/mL (ref >3.0)

## 2017-03-18 LAB — VITAMIN B12

## 2017-03-24 ENCOUNTER — Ambulatory Visit: Payer: 59

## 2017-03-29 ENCOUNTER — Ambulatory Visit: Payer: 59 | Attending: Plastic Surgery

## 2017-03-29 DIAGNOSIS — M25611 Stiffness of right shoulder, not elsewhere classified: Secondary | ICD-10-CM | POA: Insufficient documentation

## 2017-03-29 DIAGNOSIS — M25612 Stiffness of left shoulder, not elsewhere classified: Secondary | ICD-10-CM | POA: Diagnosis present

## 2017-03-29 DIAGNOSIS — R293 Abnormal posture: Secondary | ICD-10-CM | POA: Diagnosis present

## 2017-03-29 DIAGNOSIS — M25511 Pain in right shoulder: Secondary | ICD-10-CM | POA: Insufficient documentation

## 2017-03-29 DIAGNOSIS — M25512 Pain in left shoulder: Secondary | ICD-10-CM | POA: Insufficient documentation

## 2017-03-29 NOTE — Therapy (Signed)
[place], [place], [address] Phone: [phone]   Fax:  [phone]  Physical Therapy Treatment  Patient Details  Name: Amanda Williams MRN: [id number] Date of Birth: [birth year] Referring Provider: Dr. Iran Planas   Encounter Date: 03/29/2017  PT End of Session - 03/29/17 1210    Visit Number  7    Number of Visits  9    Date for PT Re-Evaluation  03/08/17    PT Start Time  1018    PT Stop Time  1102    PT Time Calculation (min)  44 min    Activity Tolerance  Patient tolerated treatment well    Behavior During Therapy  [facility] for tasks assessed/performed       Past Medical History:  Diagnosis Date  . Allergy   . Breast cancer ([place])   . Difficult intubation    see 01/12/17 anesthesia record  . Family history of breast cancer   . Hematuria    ? trigonitis  . Panic attack    echo 2009 normal    Past Surgical History:  Procedure Laterality Date  . BREAST BIOPSY     left  . BREAST RECONSTRUCTION WITH PLACEMENT OF TISSUE EXPANDER AND FLEX HD (ACELLULAR HYDRATED DERMIS) Bilateral 01/12/2017   Procedure: BREAST RECONSTRUCTION WITH PLACEMENT OF TISSUE EXPANDER AND ALLODERM;  Surgeon: Irene Limbo, MD;  Location: [place];  Service: Plastics;  Laterality: Bilateral;  . MASTECTOMY W/ SENTINEL NODE BIOPSY Bilateral 01/12/2017   Procedure: RIGHT MODIFIED RADICAL MASTECTOMY, LEFT TOTAL MASTECTOMY WITH LEFT SENTINEL LYMPH NODE BIOPSY;  Surgeon: Rolm Bookbinder, MD;  Location: [place];  Service: General;  Laterality: Bilateral;  . PORTACATH PLACEMENT N/A 02/15/2017   Procedure: INSERTION PORT-A-CATH WITH [place];  Surgeon: Rolm Bookbinder, MD;  Location: [place];  Service: General;  Laterality: N/A;    There were no vitals filed for this visit.  Subjective Assessment - 03/29/17 1022    Subjective  My shoulders have been doing well since I was here last and my cruise went  really well. I didn't do my exercises while we were gone but the tightness has been much improved especially in my Rt shoulder where it's the worse. I feel good about making today my last day.     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 There are 3 masses on the left each measuring 1 cm located in vasrious quadrants. There are 4 masses on the right breast located in 3 quadrants measuring 2.2, 3.1, 2.5, and 4 cm. All masses that were biopsied are ER/PR positive and HER2 negative with a Ki67 of 10%. An axillary node on the right side was biopsied and found to be positive., 01/12/17- pt underwent bilateral mastectomies, she had drains removed about 2 weeks ago, pt wil have to undergo chemotherpy followed by radiation- she will begin radiaiton on 02/16/17     Patient Stated Goals  to know what kind of exercises to do to increase strength and flexibility, stamina    Currently in Pain?  No/denies         [facility] PT Assessment - 03/29/17 0001      AROM   Right Shoulder Flexion  162 Degrees    Right Shoulder ABduction  175 Degrees    Right Shoulder Internal Rotation  72 Degrees    Left Shoulder Flexion  155 Degrees    Left Shoulder ABduction  171 Degrees    Left Shoulder Internal Rotation  72 Degrees                  [facility] Adult PT Treatment/Exercise - 03/29/17 0001      Shoulder Exercises: Pulleys   Flexion  1 minute    ABduction  1 minute      Shoulder Exercises: Therapy Ball   Flexion  10 reps With forward lean into end of stretch    ABduction  10 reps Rt UE with same side lean into end of stretch      Manual Therapy   Manual Therapy  Myofascial release;Passive ROM    Myofascial Release  gentle myofascial pulling to Rt UE with movement into abduction; also at Rt trunk away from axilla for increased strtch during P/ROM    Passive ROM  to Rt shoulders in direction of flexion, abduction and D2 to pt's  tolerance             PT Education - 03/29/17 1215    Education provided  Yes    Education Details  Lymphedema risk reduction and infection prevention    Person(s) Educated  Patient    Methods  Explanation;Handout    Comprehension  Verbalized understanding           Breast Clinic Goals - 12/23/16 1622      Patient will be able to verbalize understanding of pertinent lymphedema risk reduction practices relevant to her diagnosis specifically related to skin care.   Time  1    Period  Days    Status  Achieved      Patient will be able to return demonstrate and/or verbalize understanding of the post-op home exercise program related to regaining shoulder range of motion.   Time  1    Period  Days    Status  Achieved      Patient will be able to verbalize understanding of the importance of attending the postoperative After Breast Cancer Class for further lymphedema risk reduction education and therapeutic exercise.   Time  1    Period  Days    Status  Achieved       Long Term Clinic Goals - 03/29/17 1030      CC Long Term Goal  #1   Title  Pt to be able to independently verbalize lymphedema risk reduction practices    Baseline  Instructed pt in this today and issued handout-03/29/17    Status  Achieved      CC Long Term Goal  #2   Title  Pt to demonstrate 165 degrees of flexion bilaterally to allow pt to reach up overhead.    Baseline  R 109, L 116; Bil 132 degress today-02/23/17; Rt 152 and Lt 145 degrees-03/03/17; Rt 162 and Lt 155 degrees- 03/29/17    Status  Partially Met      CC Long Term Goal  #3   Title  Pt to demonstrate 165 degrees of bilateral shoulder abduction to allow pt to reach out to sides to complete ADLs.    Baseline  R 84, L 105; Rt 107 and Lt 109 degrees-02/23/17; Rt 173 and Lt 168-03/03/17; Rt 175 and Lt 171 degrees-03/29/17    Status  Achieved      CC Long Term Goal  #4   Title  Pt to be independent in a home exercise program for continued  strengthening and stretching.    Status  Achieved      CC Long  Term Goal  #5   Title  Pt to report a 75% improvement in swelling in bilateral trunk to allow improved comfort.    Baseline  100% improved-03/29/17    Status  Achieved      CC Long Term Goal  #6   Title  Pt to report a 75% improvement in tightness across chest with bilateral shoulder ROM to allow improved comfort.     Baseline  This some limited at Rt side as pt had port placement last week-02/23/17; pt reports feeling very minimal tightness here now with ROM though did not rate it-03/03/17; 95% improvement repotred with this-03/29/17    Status  Achieved         Plan - 03/29/17 1211    Clinical Impression Statement  Pt has done very well overall with therapy and feels near 100% improved. All goals have been met except for her bil A/ROM shoulder flexion is still slightly limited to end range but it has improved greatly since start of care and pt does not feel limtied with ADLs. She repots feeling ready to D/C to HEP at this time.      Rehab Potential  Good    Clinical Impairments Affecting Rehab Potential  pt. started chemo 02/16/17    PT Frequency  2x / week    PT Duration  4 weeks    PT Treatment/Interventions  ADLs/Self Care Home Management;Therapeutic exercise;Therapeutic activities;Patient/family education;Orthotic Fit/Training;Manual lymph drainage;Manual techniques;Scar mobilization;Passive range of motion;Taping    PT Next Visit Plan  D/C this visit.     Consulted and Agree with Plan of Care  Patient       Patient will benefit from skilled therapeutic intervention in order to improve the following deficits and impairments:  Decreased range of motion, Impaired UE functional use, Pain, Decreased knowledge of precautions, Postural dysfunction, Increased edema, Decreased strength, Decreased scar mobility, Increased fascial restricitons  Visit Diagnosis: Stiffness of right shoulder, not elsewhere classified  Shoulder  stiffness, left  Acute pain of right shoulder  Acute pain of left shoulder  Abnormal posture     Problem List Patient Active Problem List   Diagnosis Date Noted  . Bilateral breast cancer ([place]) 01/12/2017  . Genetic testing 01/05/2017  . Family history of breast cancer   . Malignant neoplasm of overlapping sites of right breast in female, estrogen receptor positive ([place]) 12/23/2016  . Malignant neoplasm of overlapping 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 breast ([place]) 12/18/2016  . Exposure to communicable disease 08/04/2012  . Counseling about travel 08/04/2012  . Throat symptom 04/12/2012  . Visit for preventive health examination 10/18/2010  . Dense breasts 10/18/2010  . Pelvic pressure in female 05/20/2010  . PANIC ATTACK 03/27/2008  . MICROSCOPIC HEMATURIA 03/27/2008  . PALPITATIONS 02/02/2007  . ALLERGIC RHINITIS 10/07/2006    Otelia Limes, PTA 03/29/2017, 12:16 PM  [place], [place], [address] Phone: [phone]   Fax:  [phone]  Name: Amanda Williams MRN: [id number] Date of Birth: 07-29-1965  PHYSICAL THERAPY DISCHARGE SUMMARY  Visits from Start of Care: 7  Current functional level related to goals / functional outcomes: Goals met for the most part, as noted above.   Remaining deficits: Still with mild shoulder flexion limitations.   Education / Equipment: HEP, self-care  Plan: Patient agrees to discharge.  Patient goals were met. Patient is being discharged due to meeting the stated rehab goals.  ?????    Serafina Royals, PT 03/29/17 5:22 PM

## 2017-03-30 ENCOUNTER — Other Ambulatory Visit: Payer: 59

## 2017-03-30 ENCOUNTER — Ambulatory Visit: Payer: 59

## 2017-03-30 ENCOUNTER — Ambulatory Visit: Payer: 59 | Admitting: Hematology and Oncology

## 2017-04-02 ENCOUNTER — Telehealth: Payer: Self-pay | Admitting: Hematology and Oncology

## 2017-04-02 ENCOUNTER — Other Ambulatory Visit: Payer: Self-pay

## 2017-04-02 DIAGNOSIS — C50011 Malignant neoplasm of nipple and areola, right female breast: Secondary | ICD-10-CM

## 2017-04-02 DIAGNOSIS — C50012 Malignant neoplasm of nipple and areola, left female breast: Principal | ICD-10-CM

## 2017-04-02 DIAGNOSIS — Z170 Estrogen receptor positive status [ER+]: Principal | ICD-10-CM

## 2017-04-02 MED ORDER — PROCHLORPERAZINE MALEATE 10 MG PO TABS: 10.0000 mg | ORAL_TABLET | Freq: Four times a day (QID) | ORAL | 2 refills | Status: DC | PRN

## 2017-04-02 NOTE — Telephone Encounter (Signed)
S/w pt to confirm appt time chg on 1/16 from 10am to 8am. Pt confirmed.

## 2017-04-06 ENCOUNTER — Other Ambulatory Visit: Payer: Self-pay

## 2017-04-06 DIAGNOSIS — Z170 Estrogen receptor positive status [ER+]: Secondary | ICD-10-CM

## 2017-04-06 DIAGNOSIS — C50812 Malignant neoplasm of overlapping 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 breast: Secondary | ICD-10-CM

## 2017-04-06 NOTE — Assessment & Plan Note (Signed)
01/12/2017:Left mastectomy: IDC with DCIS, 2 tumors 3.6 cm ER 90%, PR 95%, Ki-67 5%, HER-2 negative ratio 1.32) and 1.2 cm (ER 95% PR 95% positive Her 2 Positive ratio 3.2, Ki-67 5%) T2 N0 stage Ib Right mastectomy: Invasive and in situ lobular carcinoma involves the dermis of the skin of the nipple, margins negative, 3/3 lymph nodes positive, grade 1, EF 75%, PR 95%, HER-2 negative ratio 1.19, Ki-67 10%, T3N1A stage IIa  Treatment plan: 1. Adjuvant chemotherapy with TCH followed by Herceptin maintenance for 1 year 2. Adjuvant radiation therapy 3. Followed by adjuvant antiestrogen therapy __________________________________________________________________________________________ Echo on 02/01/17: 55% - 60% Current treatment: Cycle 3 day 1 TCH Chemo toxicities: 1.Fatigue 2.nausea grade 1 3.Acid reflux 4.Bone pain related to Neulasta in spite of Claritin 5.  Chemotherapy-induced anemia   Based on today's blood work, I recommended that she continue with Neulasta.  Patient returned from a cruise vacation and had a wonderful time. RTC in 3 weeks for cycle 4

## 2017-04-07 ENCOUNTER — Inpatient Hospital Stay: Payer: 59 | Attending: Hematology and Oncology

## 2017-04-07 ENCOUNTER — Inpatient Hospital Stay: Payer: 59

## 2017-04-07 ENCOUNTER — Inpatient Hospital Stay (HOSPITAL_BASED_OUTPATIENT_CLINIC_OR_DEPARTMENT_OTHER): Payer: 59 | Admitting: Hematology and Oncology

## 2017-04-07 ENCOUNTER — Telehealth: Payer: Self-pay | Admitting: Hematology and Oncology

## 2017-04-07 DIAGNOSIS — Z5189 Encounter for other specified aftercare: Secondary | ICD-10-CM | POA: Diagnosis not present

## 2017-04-07 DIAGNOSIS — Z5112 Encounter for antineoplastic immunotherapy: Secondary | ICD-10-CM | POA: Insufficient documentation

## 2017-04-07 DIAGNOSIS — C773 Secondary and unspecified malignant neoplasm of axilla and upper limb lymph nodes: Secondary | ICD-10-CM | POA: Insufficient documentation

## 2017-04-07 DIAGNOSIS — C50812 Malignant neoplasm of overlapping 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 breast: Secondary | ICD-10-CM | POA: Diagnosis not present

## 2017-04-07 DIAGNOSIS — C50011 Malignant neoplasm of nipple and areola, right female breast: Secondary | ICD-10-CM

## 2017-04-07 DIAGNOSIS — Z9013 Acquired absence of bilateral breasts and nipples: Secondary | ICD-10-CM | POA: Diagnosis not present

## 2017-04-07 DIAGNOSIS — Z5111 Encounter for antineoplastic chemotherapy: Secondary | ICD-10-CM | POA: Insufficient documentation

## 2017-04-07 DIAGNOSIS — Z170 Estrogen receptor positive status [ER+]: Secondary | ICD-10-CM

## 2017-04-07 DIAGNOSIS — C50012 Malignant neoplasm of nipple and areola, left female breast: Principal | ICD-10-CM

## 2017-04-07 DIAGNOSIS — C50811 Malignant neoplasm of overlapping sites of right female breast: Secondary | ICD-10-CM

## 2017-04-07 LAB — CBC WITH DIFFERENTIAL (CANCER CENTER ONLY)
Basophils Absolute: 0 10*3/uL (ref 0.0–0.1)
Basophils Relative: 0 %
EOS ABS: 0 10*3/uL (ref 0.0–0.5)
Eosinophils Relative: 0 %
HCT: 31.9 % — ABNORMAL LOW (ref 34.8–46.6)
HEMOGLOBIN: 10.3 g/dL — AB (ref 11.6–15.9)
LYMPHS ABS: 0.8 10*3/uL — AB (ref 0.9–3.3)
Lymphocytes Relative: 9 %
MCH: 29.3 pg (ref 25.1–34.0)
MCHC: 32.3 g/dL (ref 31.5–36.0)
MCV: 90.9 fL (ref 79.5–101.0)
Monocytes Absolute: 0.1 10*3/uL (ref 0.1–0.9)
Monocytes Relative: 1 %
NEUTROS PCT: 90 %
Neutro Abs: 8.3 10*3/uL — ABNORMAL HIGH (ref 1.5–6.5)
Platelet Count: 298 10*3/uL (ref 145–400)
RBC: 3.51 MIL/uL — AB (ref 3.70–5.45)
RDW: 14 % (ref 11.2–16.1)
WBC: 9.2 10*3/uL (ref 3.9–10.3)

## 2017-04-07 LAB — CMP (CANCER CENTER ONLY)
ALBUMIN: 3.8 g/dL (ref 3.5–5.0)
ALK PHOS: 83 U/L (ref 40–150)
ALT: 21 U/L (ref 0–55)
AST: 21 U/L (ref 5–34)
Anion gap: 10 (ref 3–11)
BUN: 14 mg/dL (ref 7–26)
CALCIUM: 8.8 mg/dL (ref 8.4–10.4)
CO2: 24 mmol/L (ref 22–29)
CREATININE: 0.99 mg/dL (ref 0.60–1.10)
Chloride: 106 mmol/L (ref 98–109)
GFR, Estimated: >60 mL/min (ref >60)
GLUCOSE: 169 mg/dL — AB (ref 70–140)
Potassium: 4.2 mmol/L (ref 3.3–4.7)
SODIUM: 140 mmol/L (ref 136–145)
Total Bilirubin: 0.4 mg/dL (ref 0.2–1.2)
Total Protein: 7.2 g/dL (ref 6.4–8.3)

## 2017-04-07 MED ORDER — PALONOSETRON HCL INJECTION 0.25 MG/5ML
0.2500 mg | Freq: Once | INTRAVENOUS | Status: AC
  Administered 2017-04-07: 0.25 mg via INTRAVENOUS

## 2017-04-07 MED ORDER — DIPHENHYDRAMINE HCL 25 MG PO CAPS
ORAL_CAPSULE | ORAL | Status: AC
  Filled 2017-04-07: qty 2

## 2017-04-07 MED ORDER — ACETAMINOPHEN 325 MG PO TABS
ORAL_TABLET | ORAL | Status: AC
  Filled 2017-04-07: qty 2

## 2017-04-07 MED ORDER — DEXAMETHASONE SODIUM PHOSPHATE 10 MG/ML IJ SOLN
INTRAMUSCULAR | Status: AC
  Filled 2017-04-07: qty 1

## 2017-04-07 MED ORDER — DIPHENHYDRAMINE HCL 25 MG PO CAPS
50.0000 mg | ORAL_CAPSULE | Freq: Once | ORAL | Status: AC
  Administered 2017-04-07: 50 mg via ORAL

## 2017-04-07 MED ORDER — PEGFILGRASTIM 6 MG/0.6ML ~~LOC~~ PSKT
PREFILLED_SYRINGE | SUBCUTANEOUS | Status: AC
  Filled 2017-04-07: qty 0.6

## 2017-04-07 MED ORDER — SODIUM CHLORIDE 0.9 % IV SOLN
75.0000 mg/m2 | Freq: Once | INTRAVENOUS | Status: AC
  Administered 2017-04-07: 130 mg via INTRAVENOUS
  Filled 2017-04-07: qty 13

## 2017-04-07 MED ORDER — PEGFILGRASTIM 6 MG/0.6ML ~~LOC~~ PSKT
6.0000 mg | PREFILLED_SYRINGE | Freq: Once | SUBCUTANEOUS | Status: AC
  Administered 2017-04-07: 6 mg via SUBCUTANEOUS

## 2017-04-07 MED ORDER — SODIUM CHLORIDE 0.9% FLUSH
10.0000 mL | Freq: Once | INTRAVENOUS | Status: AC
  Administered 2017-04-07: 10 mL
  Filled 2017-04-07: qty 10

## 2017-04-07 MED ORDER — PALONOSETRON HCL INJECTION 0.25 MG/5ML
INTRAVENOUS | Status: AC
  Filled 2017-04-07: qty 5

## 2017-04-07 MED ORDER — SODIUM CHLORIDE 0.9 % IV SOLN
Freq: Once | INTRAVENOUS | Status: AC
  Administered 2017-04-07: 10:00:00 via INTRAVENOUS

## 2017-04-07 MED ORDER — ACETAMINOPHEN 325 MG PO TABS
650.0000 mg | ORAL_TABLET | Freq: Once | ORAL | Status: AC
  Administered 2017-04-07: 650 mg via ORAL

## 2017-04-07 MED ORDER — DEXAMETHASONE SODIUM PHOSPHATE 10 MG/ML IJ SOLN
10.0000 mg | Freq: Once | INTRAMUSCULAR | Status: AC
  Administered 2017-04-07: 10 mg via INTRAVENOUS

## 2017-04-07 MED ORDER — SODIUM CHLORIDE 0.9 % IV SOLN
618.6000 mg | Freq: Once | INTRAVENOUS | Status: AC
  Administered 2017-04-07: 620 mg via INTRAVENOUS
  Filled 2017-04-07: qty 62

## 2017-04-07 MED ORDER — TRASTUZUMAB CHEMO 150 MG IV SOLR
6.0000 mg/kg | Freq: Once | INTRAVENOUS | Status: AC
  Administered 2017-04-07: 399 mg via INTRAVENOUS
  Filled 2017-04-07: qty 19

## 2017-04-07 MED ORDER — SODIUM CHLORIDE 0.9% FLUSH
10.0000 mL | INTRAVENOUS | Status: DC | PRN
  Administered 2017-04-07: 10 mL
  Filled 2017-04-07: qty 10

## 2017-04-07 MED ORDER — HEPARIN SOD (PORK) LOCK FLUSH 100 UNIT/ML IV SOLN
500.0000 [IU] | Freq: Once | INTRAVENOUS | Status: AC | PRN
  Administered 2017-04-07: 500 [IU]
  Filled 2017-04-07: qty 5

## 2017-04-07 NOTE — Progress Notes (Signed)
Patient Care Team: Panosh, Standley Brooking, MD as PCP - General Delsa Bern, MD (Obstetrics and Gynecology) Excell Seltzer, MD as Consulting Physician (General Surgery) Nicholas Lose, MD as Consulting Physician (Hematology and Oncology) Kyung Rudd, MD as Consulting Physician (Radiation Oncology)  DIAGNOSIS:  Encounter Diagnosis  Name Primary?  . Malignant neoplasm of overlapping sites of left breast in female, estrogen receptor positive ([place])     SUMMARY OF ONCOLOGIC HISTORY:   Malignant neoplasm of overlapping 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 breast ([place])   01/12/2017 Surgery    Left mastectomy: IDC with DCIS, 2 tumors 3.6 cm ER 90%, PR 95%, Ki-67 5%, HER-2 negative ratio 1.32) and 1.2 cm (ER 95% PR 95% positive Her 2 Positive ratio 3.2, Ki-67 5%) T2 N0 stage Ib      02/16/2017 -  Chemotherapy    TCH x6 followed by Herceptin maintenance        Malignant neoplasm of overlapping sites of right breast in female, estrogen receptor positive ([place])   12/21/2016 Initial Diagnosis    Palpable lumps in the right breast with nipple inversion: Mammogram revealed skin thickening and distortion, ultrasound revealed 4 masses 3.1 cm at 11:00, 2.5 cm at 6:00, 4 cm at 8:00, 2.2 cm at 5:00 with 2 abnormal axillary lymph nodes: MRI revealed 7 cm abnormality on right breast, in addition 3.6 cm abnormality in the left breast and 2 additional masses 1 cm each; right breast: T4 N1 stage III B clinical stage; left breast: T2 N0 stage IB clinical stage      12/21/2016 Pathology Results    Right breast: Grade 1 Invasive lobular cancer, lymph node also positive, ER 70%, PR 90%, Ki-67 10%, HER-2 negative ratio 1.09 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  The Multi-Cancer Panel was ordered. The Multi-Cancer Panel offered by [facility] includes  sequencing and/or deletion duplication testing of the following 83 genes: ALK, APC, ATM, AXIN2,BAP1,  BARD1, BLM, BMPR1A, BRCA1, BRCA2, BRIP1, CASR, CDC73, CDH1, CDK4, CDKN1B, CDKN1C, CDKN2A (p14ARF), CDKN2A (p16INK4a), CEBPA, CHEK2, CTNNA1, DICER1, DIS3L2, EGFR (c.2369C>T, p.Thr790Met variant only), EPCAM (Deletion/duplication testing only), FH, FLCN, GATA2, GPC3, GREM1 (Promoter region deletion/duplication testing only), HOXB13 (c.251G>A, p.Gly84Glu), HRAS, KIT, MAX, MEN1, MET, MITF (c.952G>A, p.Glu318Lys variant only), MLH1, MSH2, MSH3, MSH6, MUTYH, NBN, NF1, NF2, NTHL1, PALB2, PDGFRA, PHOX2B, PMS2, POLD1, POLE, POT1, PRKAR1A, PTCH1, PTEN, RAD50, RAD51C, RAD51D, RB1, RECQL4, RET, RUNX1, SDHAF2, SDHA (sequence changes only), SDHB, SDHC, SDHD, SMAD4, SMARCA4, SMARCB1, SMARCE1, STK11, SUFU, TERC, TERT, TMEM127, TP53, TSC1, TSC2, VHL, WRN and WT1.   Results: Negative, no pathogenic variants identified.  The date of this test report is 01/04/2017.        01/12/2017 Surgery    Right mastectomy: Invasive and in situ lobular carcinoma involves the dermis of the skin of the nipple, margins negative, 3/3 lymph nodes positive, grade 1, EF 75%, PR 95%, HER-2 negative ratio 1.19, Ki-67 10%, T3N1A stage IIa       CHIEF COMPLIANT: Cycle 3 TCH  INTERVAL HISTORY: NAKAIYA BEDDOW is a 52 year old with above-mentioned history of bilateral breast cancers who underwent bilateral mastectomies and is currently on adjuvant chemotherapy with TCH.  Today is cycle 3 overall she tolerated cycles 1 and 2 reasonably well except for mild nausea and fatigue as well as acid reflux symptoms and  bone pain from Neulasta.  Other than the bone pain from Neulasta the rest of the symptoms have improved after cycle 2.  REVIEW OF SYSTEMS:   Constitutional: Denies fevers, chills or abnormal weight loss, complains of fatigue from chemotherapy Eyes: Denies blurriness of vision Ears, nose, mouth, throat, and face: Denies mucositis or sore  throat Respiratory: Denies cough, dyspnea or wheezes Cardiovascular: Denies palpitation, chest discomfort Gastrointestinal: Mild nausea Skin: Denies abnormal skin rashes Lymphatics: Denies new lymphadenopathy or easy bruising Neurological:Denies numbness, tingling or new weaknesses Behavioral/Psych: Mood is stable, no new changes  Extremities: No lower extremity edema, Neulasta related bone pain   All other systems were reviewed with the patient and are negative.  I have reviewed the past medical history, past surgical history, social history and family history with the patient and they are unchanged from previous note.  ALLERGIES:  is allergic to penicillins; sulfonamide derivatives; and adhesive [tape].  MEDICATIONS:  Current Outpatient Medications  Medication Sig Dispense Refill  . acetaminophen (TYLENOL) 500 MG tablet Take 1,000 mg 2 (two) times daily as needed by mouth for moderate pain.    . cephALEXin (KEFLEX) 500 MG capsule Take 1 capsule (500 mg total) by mouth 2 (two) times daily. 14 capsule 0  . dexamethasone (DECADRON) 4 MG tablet Take 1 tablet (4 mg total) by mouth daily. Take 1 tablet a day before chemo and 1 tablet a day after 12 tablet 0  . ibuprofen (ADVIL,MOTRIN) 200 MG tablet Take 400 mg 2 (two) times daily as needed by mouth for moderate pain.    Marland Kitchen lidocaine-prilocaine (EMLA) cream Apply to affected area once 30 g 3  . LORazepam (ATIVAN) 0.5 MG tablet Take 1 tablet (0.5 mg total) by mouth every 6 (six) hours as needed (Nausea or vomiting). 30 tablet 0  . ondansetron (ZOFRAN) 8 MG tablet Take 1 tablet (8 mg total) by mouth 2 (two) times daily as needed for refractory nausea / vomiting. Start on day 3 after chemo. 30 tablet 1  . prochlorperazine (COMPAZINE) 10 MG tablet Take 1 tablet (10 mg total) by mouth every 6 (six) hours as needed (Nausea or vomiting). 90 tablet 2  . sertraline (ZOLOFT) 100 MG tablet Take 100 mg every evening by mouth.      No current  facility-administered medications for this visit.     PHYSICAL EXAMINATION: ECOG PERFORMANCE STATUS: 1 - Symptomatic but completely ambulatory  Vitals:   04/07/17 0844  BP: (!) 145/91  Pulse: 85  Resp: 18  Temp: 98.2 F (36.8 C)  SpO2: 98%   Filed Weights   04/07/17 0844  Weight: 151 lb 9.6 oz (68.8 kg)    GENERAL:alert, no distress and comfortable SKIN: skin color, texture, turgor are normal, no rashes or significant lesions EYES: normal, Conjunctiva are pink and non-injected, sclera clear OROPHARYNX:no exudate, no erythema and lips, buccal mucosa, and tongue normal  NECK: supple, thyroid normal size, non-tender, without nodularity LYMPH:  no palpable lymphadenopathy in the cervical, axillary or inguinal LUNGS: clear to auscultation and percussion with normal breathing effort HEART: regular rate & rhythm and no murmurs and no lower extremity edema ABDOMEN:abdomen soft, non-tender and normal bowel sounds MUSCULOSKELETAL:no cyanosis of digits and no clubbing  NEURO: alert & oriented x 3 with fluent speech, no focal motor/sensory deficits EXTREMITIES: No lower extremity edema  LABORATORY DATA:  I have reviewed the data as listed CMP Latest Ref Rng & Units 03/17/2017 02/23/2017 02/16/2017  Glucose 70 - 140 mg/dl 172(H) 125 157(H)  BUN 7.0 - 26.0 mg/dL 13.0 9.5 9.7  Creatinine 0.6 - 1.1 mg/dL 0.9 0.9 0.8  Sodium 136 - 145 mEq/L 140 140 138  Potassium 3.5 - 5.1 mEq/L 4.0 3.2(L) 3.7  Chloride 96 - 112 mEq/L - - -  CO2 22 - 29 mEq/L 22 25 22   Calcium 8.4 - 10.4 mg/dL 8.5 8.5 8.8  Total Protein 6.4 - 8.3 g/dL 6.9 6.5 6.7  Total Bilirubin 0.20 - 1.20 mg/dL 0.34 <0.22 0.26  Alkaline Phos 40 - 150 U/L 70 76 62  AST 5 - 34 U/L 23 20 11   ALT 0 - 55 U/L 20 14 9     Lab Results  Component Value Date   WBC 4.3 03/17/2017   HGB 10.0 (L) 03/17/2017   HCT 31.9 (L) 04/07/2017   MCV 90.9 04/07/2017   PLT 245 03/17/2017   NEUTROABS 8.3 (H) 04/07/2017    ASSESSMENT & PLAN:    Malignant neoplasm of overlapping 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 breast ([place]) 01/12/2017:Left mastectomy: IDC with DCIS, 2 tumors 3.6 cm ER 90%, PR 95%, Ki-67 5%, HER-2 negative ratio 1.32) and 1.2 cm (ER 95% PR 95% positive Her 2 Positive ratio 3.2, Ki-67 5%) T2 N0 stage Ib Right mastectomy: Invasive and in situ lobular carcinoma involves the dermis of the skin of the nipple, margins negative, 3/3 lymph nodes positive, grade 1, EF 75%, PR 95%, HER-2 negative ratio 1.19, Ki-67 10%, T3N1A stage IIa  Treatment plan: 1. Adjuvant chemotherapy with TCH followed by Herceptin maintenance for 1 year 2. Adjuvant radiation therapy 3. Followed by adjuvant antiestrogen therapy __________________________________________________________________________________________ Echo on 02/01/17: 55% - 60% Current treatment: Cycle 3 day 1 TCH Chemo toxicities: 1.Fatigue 2.nausea grade 1 3.Acid reflux 4.Bone pain related to Neulasta in spite of Claritin 5.  Chemotherapy-induced anemia: Being monitored I encouraged her to eat iron rich foods.  Iron studies did not show any significant iron deficiency.  Patient returned from a cruise vacation and had a wonderful time. RTC in 3 weeks for cycle 4   I spent 25 minutes talking to the patient of which more than half was spent in counseling and coordination of care.  No orders of the defined types were placed in this encounter.  The patient has a good understanding of the overall plan. she agrees with it. she will call with any problems that may develop before the next visit here.   Harriette Ohara, MD 04/07/17

## 2017-04-07 NOTE — Patient Instructions (Signed)
[facility] Discharge Instructions for Patients Receiving Chemotherapy  Today you received the following chemotherapy agents Herceptin. Taxotere, and Carboplatin  To help prevent nausea and vomiting after your treatment, we encourage you to take your nausea medication as directed   If you develop nausea and vomiting that is not controlled by your nausea medication, call the clinic.   BELOW ARE SYMPTOMS THAT SHOULD BE REPORTED IMMEDIATELY:  *FEVER GREATER THAN 100.5 F  *CHILLS WITH OR WITHOUT FEVER  NAUSEA AND VOMITING THAT IS NOT CONTROLLED WITH YOUR NAUSEA MEDICATION  *UNUSUAL SHORTNESS OF BREATH  *UNUSUAL BRUISING OR BLEEDING  TENDERNESS IN MOUTH AND THROAT WITH OR WITHOUT PRESENCE OF ULCERS  *URINARY PROBLEMS  *BOWEL PROBLEMS  UNUSUAL RASH Items with * indicate a potential emergency and should be followed up as soon as possible.  Feel free to call the clinic should you have any questions or concerns. The clinic phone number is [phone].  Please show the [place] at check-in to the Emergency Department and triage nurse.

## 2017-04-07 NOTE — Telephone Encounter (Signed)
Patient will receive updated schedule in treatment area. Patient scheduled per 1/16 los.

## 2017-04-20 ENCOUNTER — Ambulatory Visit: Payer: 59 | Admitting: Hematology and Oncology

## 2017-04-20 ENCOUNTER — Other Ambulatory Visit: Payer: 59

## 2017-04-20 ENCOUNTER — Encounter: Payer: Self-pay | Admitting: Hematology and Oncology

## 2017-04-20 ENCOUNTER — Ambulatory Visit: Payer: 59

## 2017-04-21 ENCOUNTER — Other Ambulatory Visit: Payer: Self-pay | Admitting: Hematology and Oncology

## 2017-04-21 DIAGNOSIS — C50011 Malignant neoplasm of nipple and areola, right female breast: Secondary | ICD-10-CM

## 2017-04-21 DIAGNOSIS — Z170 Estrogen receptor positive status [ER+]: Principal | ICD-10-CM

## 2017-04-21 DIAGNOSIS — C50012 Malignant neoplasm of nipple and areola, left female breast: Principal | ICD-10-CM

## 2017-04-27 ENCOUNTER — Other Ambulatory Visit: Payer: Self-pay

## 2017-04-27 ENCOUNTER — Inpatient Hospital Stay: Payer: 59

## 2017-04-27 ENCOUNTER — Inpatient Hospital Stay: Payer: 59 | Attending: Hematology and Oncology | Admitting: Hematology and Oncology

## 2017-04-27 ENCOUNTER — Other Ambulatory Visit: Payer: Self-pay | Admitting: Hematology and Oncology

## 2017-04-27 ENCOUNTER — Other Ambulatory Visit: Payer: 59

## 2017-04-27 VITALS — BP 111/78 | HR 81 | Temp 99.1°F | Resp 18 | Ht 66.0 in | Wt 153.8 lb

## 2017-04-27 DIAGNOSIS — Z5112 Encounter for antineoplastic immunotherapy: Secondary | ICD-10-CM | POA: Insufficient documentation

## 2017-04-27 DIAGNOSIS — R21 Rash and other nonspecific skin eruption: Secondary | ICD-10-CM | POA: Insufficient documentation

## 2017-04-27 DIAGNOSIS — C50811 Malignant neoplasm of overlapping sites of right female breast: Secondary | ICD-10-CM

## 2017-04-27 DIAGNOSIS — C50011 Malignant neoplasm of nipple and areola, right female breast: Secondary | ICD-10-CM

## 2017-04-27 DIAGNOSIS — Z170 Estrogen receptor positive status [ER+]: Principal | ICD-10-CM

## 2017-04-27 DIAGNOSIS — C773 Secondary and unspecified malignant neoplasm of axilla and upper limb lymph nodes: Secondary | ICD-10-CM

## 2017-04-27 DIAGNOSIS — C50812 Malignant neoplasm of overlapping 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 breast: Secondary | ICD-10-CM

## 2017-04-27 DIAGNOSIS — G4700 Insomnia, unspecified: Secondary | ICD-10-CM | POA: Insufficient documentation

## 2017-04-27 DIAGNOSIS — D6481 Anemia due to antineoplastic chemotherapy: Secondary | ICD-10-CM | POA: Insufficient documentation

## 2017-04-27 DIAGNOSIS — Z9012 Acquired absence of left breast and nipple: Secondary | ICD-10-CM | POA: Diagnosis not present

## 2017-04-27 DIAGNOSIS — Z5111 Encounter for antineoplastic chemotherapy: Secondary | ICD-10-CM | POA: Diagnosis not present

## 2017-04-27 DIAGNOSIS — C50012 Malignant neoplasm of nipple and areola, left female breast: Principal | ICD-10-CM

## 2017-04-27 DIAGNOSIS — C50912 Malignant neoplasm of unspecified site of left female breast: Secondary | ICD-10-CM | POA: Diagnosis not present

## 2017-04-27 LAB — CBC WITH DIFFERENTIAL (CANCER CENTER ONLY)
Basophils Absolute: 0 10*3/uL (ref 0.0–0.1)
Basophils Relative: 1 %
EOS PCT: 0 %
Eosinophils Absolute: 0 10*3/uL (ref 0.0–0.5)
HCT: 29.5 % — ABNORMAL LOW (ref 34.8–46.6)
Hemoglobin: 9.8 g/dL — ABNORMAL LOW (ref 11.6–15.9)
LYMPHS ABS: 1.7 10*3/uL (ref 0.9–3.3)
LYMPHS PCT: 29 %
MCH: 29.6 pg (ref 25.1–34.0)
MCHC: 33.2 g/dL (ref 31.5–36.0)
MCV: 89 fL (ref 79.5–101.0)
MONO ABS: 0.5 10*3/uL (ref 0.1–0.9)
MONOS PCT: 9 %
Neutro Abs: 3.7 10*3/uL (ref 1.5–6.5)
Neutrophils Relative %: 61 %
PLATELETS: 178 10*3/uL (ref 145–400)
RBC: 3.31 MIL/uL — ABNORMAL LOW (ref 3.70–5.45)
RDW: 15.3 % — AB (ref 11.2–14.5)
WBC: 6 10*3/uL (ref 3.9–10.3)

## 2017-04-27 LAB — CMP (CANCER CENTER ONLY)
ALT: 22 U/L (ref 0–55)
ANION GAP: 9 (ref 3–11)
AST: 22 U/L (ref 5–34)
Albumin: 3.5 g/dL (ref 3.5–5.0)
Alkaline Phosphatase: 93 U/L (ref 40–150)
BUN: 11 mg/dL (ref 7–26)
CHLORIDE: 104 mmol/L (ref 98–109)
CO2: 27 mmol/L (ref 22–29)
Calcium: 8.6 mg/dL (ref 8.4–10.4)
Creatinine: 0.79 mg/dL (ref 0.60–1.10)
Glucose, Bld: 93 mg/dL (ref 70–140)
Potassium: 3.8 mmol/L (ref 3.5–5.1)
SODIUM: 140 mmol/L (ref 136–145)
Total Bilirubin: 0.3 mg/dL (ref 0.2–1.2)
Total Protein: 6.7 g/dL (ref 6.4–8.3)

## 2017-04-27 MED ORDER — HEPARIN SOD (PORK) LOCK FLUSH 100 UNIT/ML IV SOLN
500.0000 [IU] | Freq: Once | INTRAVENOUS | Status: AC | PRN
  Administered 2017-04-27: 500 [IU]
  Filled 2017-04-27: qty 5

## 2017-04-27 MED ORDER — ACETAMINOPHEN 325 MG PO TABS
ORAL_TABLET | ORAL | Status: AC
  Filled 2017-04-27: qty 2

## 2017-04-27 MED ORDER — PALONOSETRON HCL INJECTION 0.25 MG/5ML
INTRAVENOUS | Status: AC
  Filled 2017-04-27: qty 5

## 2017-04-27 MED ORDER — SODIUM CHLORIDE 0.9% FLUSH
10.0000 mL | Freq: Once | INTRAVENOUS | Status: AC
  Administered 2017-04-27: 10 mL
  Filled 2017-04-27: qty 10

## 2017-04-27 MED ORDER — PEGFILGRASTIM 6 MG/0.6ML ~~LOC~~ PSKT
6.0000 mg | PREFILLED_SYRINGE | Freq: Once | SUBCUTANEOUS | Status: AC
  Administered 2017-04-27: 6 mg via SUBCUTANEOUS

## 2017-04-27 MED ORDER — SODIUM CHLORIDE 0.9 % IV SOLN
Freq: Once | INTRAVENOUS | Status: AC
  Administered 2017-04-27: 12:00:00 via INTRAVENOUS

## 2017-04-27 MED ORDER — DIPHENHYDRAMINE HCL 25 MG PO CAPS
ORAL_CAPSULE | ORAL | Status: AC
  Filled 2017-04-27: qty 2

## 2017-04-27 MED ORDER — DIPHENHYDRAMINE HCL 25 MG PO CAPS
50.0000 mg | ORAL_CAPSULE | Freq: Once | ORAL | Status: AC
  Administered 2017-04-27: 50 mg via ORAL

## 2017-04-27 MED ORDER — PEGFILGRASTIM 6 MG/0.6ML ~~LOC~~ PSKT
PREFILLED_SYRINGE | SUBCUTANEOUS | Status: AC
  Filled 2017-04-27: qty 0.6

## 2017-04-27 MED ORDER — ACETAMINOPHEN 325 MG PO TABS
650.0000 mg | ORAL_TABLET | Freq: Once | ORAL | Status: AC
  Administered 2017-04-27: 650 mg via ORAL

## 2017-04-27 MED ORDER — SODIUM CHLORIDE 0.9 % IV SOLN
620.0000 mg | Freq: Once | INTRAVENOUS | Status: AC
  Administered 2017-04-27: 620 mg via INTRAVENOUS
  Filled 2017-04-27: qty 62

## 2017-04-27 MED ORDER — SODIUM CHLORIDE 0.9 % IV SOLN
75.0000 mg/m2 | Freq: Once | INTRAVENOUS | Status: AC
  Administered 2017-04-27: 130 mg via INTRAVENOUS
  Filled 2017-04-27: qty 13

## 2017-04-27 MED ORDER — SODIUM CHLORIDE 0.9 % IV SOLN
6.0000 mg/kg | Freq: Once | INTRAVENOUS | Status: AC
  Administered 2017-04-27: 399 mg via INTRAVENOUS
  Filled 2017-04-27: qty 19

## 2017-04-27 MED ORDER — PALONOSETRON HCL INJECTION 0.25 MG/5ML
0.2500 mg | Freq: Once | INTRAVENOUS | Status: AC
  Administered 2017-04-27: 0.25 mg via INTRAVENOUS

## 2017-04-27 MED ORDER — DEXAMETHASONE SODIUM PHOSPHATE 10 MG/ML IJ SOLN
10.0000 mg | Freq: Once | INTRAMUSCULAR | Status: AC
  Administered 2017-04-27: 10 mg via INTRAVENOUS

## 2017-04-27 MED ORDER — SODIUM CHLORIDE 0.9% FLUSH
10.0000 mL | INTRAVENOUS | Status: DC | PRN
  Administered 2017-04-27: 10 mL
  Filled 2017-04-27: qty 10

## 2017-04-27 MED ORDER — DEXAMETHASONE SODIUM PHOSPHATE 10 MG/ML IJ SOLN
INTRAMUSCULAR | Status: AC
  Filled 2017-04-27: qty 1

## 2017-04-27 NOTE — Assessment & Plan Note (Signed)
01/12/2017:Left mastectomy: IDC with DCIS, 2 tumors 3.6 cm ER 90%, PR 95%, Ki-67 5%, HER-2 negative ratio 1.32) and 1.2 cm (ER 95% PR 95% positive Her 2 Positive ratio 3.2, Ki-67 5%) T2 N0 stage Ib Right mastectomy: Invasive and in situ lobular carcinoma involves the dermis of the skin of the nipple, margins negative, 3/3 lymph nodes positive, grade 1, EF 75%, PR 95%, HER-2 negative ratio 1.19, Ki-67 10%, T3N1A stage IIa  Treatment plan: 1. Adjuvant chemotherapy with TCH followed by Herceptin maintenance for 1 year 2. Adjuvant radiation therapy 3. Followed by adjuvant antiestrogen therapy __________________________________________________________________________________________ Echo on 02/01/17: 55% - 60% Current treatment: Cycle4day 1TCH Chemo toxicities: 1.Fatigue 2.nausea grade 1 3.Acid reflux 4.Bone pain related to Neulasta in spite of Claritin 5.Chemotherapy-induced anemia: Being monitored I encouraged her to eat iron rich foods.  Iron studies did not show any significant iron deficiency.  RTC in 3 weeks for cycle 5

## 2017-04-27 NOTE — Patient Instructions (Signed)
[facility] Discharge Instructions for Patients Receiving Chemotherapy  Today you received the following chemotherapy agents:  Herceptin, Taxotere, Carboplatin  To help prevent nausea and vomiting after your treatment, we encourage you to take your nausea medication as prescribed.   If you develop nausea and vomiting that is not controlled by your nausea medication, call the clinic.   BELOW ARE SYMPTOMS THAT SHOULD BE REPORTED IMMEDIATELY:  *FEVER GREATER THAN 100.5 F  *CHILLS WITH OR WITHOUT FEVER  NAUSEA AND VOMITING THAT IS NOT CONTROLLED WITH YOUR NAUSEA MEDICATION  *UNUSUAL SHORTNESS OF BREATH  *UNUSUAL BRUISING OR BLEEDING  TENDERNESS IN MOUTH AND THROAT WITH OR WITHOUT PRESENCE OF ULCERS  *URINARY PROBLEMS  *BOWEL PROBLEMS  UNUSUAL RASH Items with * indicate a potential emergency and should be followed up as soon as possible.  Feel free to call the clinic should you have any questions or concerns. The clinic phone number is [phone].  Please show the CHEMO ALERT CARD at check-in to the Emergency Department and triage nurse.   

## 2017-04-27 NOTE — Progress Notes (Signed)
Patient Care Team: Panosh, Standley Brooking, MD as PCP - General Delsa Bern, MD (Obstetrics and Gynecology) Excell Seltzer, MD as Consulting Physician (General Surgery) Nicholas Lose, MD as Consulting Physician (Hematology and Oncology) Kyung Rudd, MD as Consulting Physician (Radiation Oncology)  DIAGNOSIS:  Encounter Diagnoses  Name Primary?  . Malignant neoplasm of overlapping sites of left breast in female, estrogen receptor positive ([place]) Yes  . Malignant neoplasm of overlapping sites of right breast in female, estrogen receptor positive ([place])     SUMMARY OF ONCOLOGIC HISTORY:   Malignant neoplasm of overlapping 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 breast ([place])   01/12/2017 Surgery    Left mastectomy: IDC with DCIS, 2 tumors 3.6 cm ER 90%, PR 95%, Ki-67 5%, HER-2 negative ratio 1.32) and 1.2 cm (ER 95% PR 95% positive Her 2 Positive ratio 3.2, Ki-67 5%) T2 N0 stage Ib      02/16/2017 -  Chemotherapy    TCH x6 followed by Herceptin maintenance        Malignant neoplasm of overlapping sites of right breast in female, estrogen receptor positive ([place])   12/21/2016 Initial Diagnosis    Palpable lumps in the right breast with nipple inversion: Mammogram revealed skin thickening and distortion, ultrasound revealed 4 masses 3.1 cm at 11:00, 2.5 cm at 6:00, 4 cm at 8:00, 2.2 cm at 5:00 with 2 abnormal axillary lymph nodes: MRI revealed 7 cm abnormality on right breast, in addition 3.6 cm abnormality in the left breast and 2 additional masses 1 cm each; right breast: T4 N1 stage III B clinical stage; left breast: T2 N0 stage IB clinical stage      12/21/2016 Pathology Results    Right breast: Grade 1 Invasive lobular cancer, lymph node also positive, ER 70%, PR 90%, Ki-67 10%, HER-2 negative ratio 1.09 Left breast: Grade 1 IDC with DCIS prognostic panel pending      01/04/2017 Genetic Testing    The patient had genetic testing due to a personal history of bilateral breast caner and a family history  of breast cancer.  The Multi-Cancer Panel was ordered. The Multi-Cancer Panel offered by [facility] includes sequencing and/or deletion duplication testing of the following 83 genes: ALK, APC, ATM, AXIN2,BAP1,  BARD1, BLM, BMPR1A, BRCA1, BRCA2, BRIP1, CASR, CDC73, CDH1, CDK4, CDKN1B, CDKN1C, CDKN2A (p14ARF), CDKN2A (p16INK4a), CEBPA, CHEK2, CTNNA1, DICER1, DIS3L2, EGFR (c.2369C>T, p.Thr790Met variant only), EPCAM (Deletion/duplication testing only), FH, FLCN, GATA2, GPC3, GREM1 (Promoter region deletion/duplication testing only), HOXB13 (c.251G>A, p.Gly84Glu), HRAS, KIT, MAX, MEN1, MET, MITF (c.952G>A, p.Glu318Lys variant only), MLH1, MSH2, MSH3, MSH6, MUTYH, NBN, NF1, NF2, NTHL1, PALB2, PDGFRA, PHOX2B, PMS2, POLD1, POLE, POT1, PRKAR1A, PTCH1, PTEN, RAD50, RAD51C, RAD51D, RB1, RECQL4, RET, RUNX1, SDHAF2, SDHA (sequence changes only), SDHB, SDHC, SDHD, SMAD4, SMARCA4, SMARCB1, SMARCE1, STK11, SUFU, TERC, TERT, TMEM127, TP53, TSC1, TSC2, VHL, WRN and WT1.   Results: Negative, no pathogenic variants identified.  The date of this test report is 01/04/2017.        01/12/2017 Surgery    Right mastectomy: Invasive and in situ lobular carcinoma involves the dermis of the skin of the nipple, margins negative, 3/3 lymph nodes positive, grade 1, EF 75%, PR 95%, HER-2 negative ratio 1.19, Ki-67 10%, T3N1A stage IIa       CHIEF COMPLIANT: Cycle 4 TCH  INTERVAL HISTORY: Amanda Williams is a 52 year old with above-mentioned history of bilateral mastectomies and is now on adjuvant chemotherapy with TCH.  She noticed more fatigue this time.  She has a first week where she  feels tired and sleeps a lot and at the second week on where she has somewhat of a difficulty with sleeping.  Denies any nausea or vomiting.  REVIEW OF SYSTEMS:   Constitutional: Denies fevers, chills or abnormal weight loss Eyes: Denies blurriness of vision Ears, nose, mouth, throat, and face: Denies mucositis or sore throat Respiratory: Denies  cough, dyspnea or wheezes Cardiovascular: Denies palpitation, chest discomfort Gastrointestinal:  Denies nausea, heartburn or change in bowel habits Skin: Denies abnormal skin rashes Lymphatics: Denies new lymphadenopathy or easy bruising Neurological:Denies numbness, tingling or new weaknesses Behavioral/Psych: Mood is stable, no new changes  Extremities: No lower extremity edema  All other systems were reviewed with the patient and are negative.  I have reviewed the past medical history, past surgical history, social history and family history with the patient and they are unchanged from previous note.  ALLERGIES:  is allergic to penicillins; sulfonamide derivatives; and adhesive [tape].  MEDICATIONS:  Current Outpatient Medications  Medication Sig Dispense Refill  . acetaminophen (TYLENOL) 500 MG tablet Take 1,000 mg 2 (two) times daily as needed by mouth for moderate pain.    . cephALEXin (KEFLEX) 500 MG capsule Take 1 capsule (500 mg total) by mouth 2 (two) times daily. 14 capsule 0  . dexamethasone (DECADRON) 4 MG tablet Take 1 tablet (4 mg total) by mouth daily. Take 1 tablet a day before chemo and 1 tablet a day after 12 tablet 0  . ibuprofen (ADVIL,MOTRIN) 200 MG tablet Take 400 mg 2 (two) times daily as needed by mouth for moderate pain.    Marland Kitchen lidocaine-prilocaine (EMLA) cream Apply to affected area once 30 g 3  . LORazepam (ATIVAN) 0.5 MG tablet TAKE 1 TABLET BY MOUTH EVERY 6 HOURS AS NEEDED FOR NAUSEA 30 tablet 1  . ondansetron (ZOFRAN) 8 MG tablet Take 1 tablet (8 mg total) by mouth 2 (two) times daily as needed for refractory nausea / vomiting. Start on day 3 after chemo. 30 tablet 1  . prochlorperazine (COMPAZINE) 10 MG tablet Take 1 tablet (10 mg total) by mouth every 6 (six) hours as needed (Nausea or vomiting). 90 tablet 2  . sertraline (ZOLOFT) 100 MG tablet Take 100 mg every evening by mouth.      No current facility-administered medications for this visit.      PHYSICAL EXAMINATION: ECOG PERFORMANCE STATUS: 1 - Symptomatic but completely ambulatory  Vitals:   04/27/17 1023  BP: 111/78  Pulse: 81  Resp: 18  Temp: 99.1 F (37.3 C)  SpO2: 100%   Filed Weights   04/27/17 1023  Weight: 153 lb 12.8 oz (69.8 kg)    GENERAL:alert, no distress and comfortable SKIN: skin color, texture, turgor are normal, no rashes or significant lesions EYES: normal, Conjunctiva are pink and non-injected, sclera clear OROPHARYNX:no exudate, no erythema and lips, buccal mucosa, and tongue normal  NECK: supple, thyroid normal size, non-tender, without nodularity LYMPH:  no palpable lymphadenopathy in the cervical, axillary or inguinal LUNGS: clear to auscultation and percussion with normal breathing effort HEART: regular rate & rhythm and no murmurs and no lower extremity edema ABDOMEN:abdomen soft, non-tender and normal bowel sounds MUSCULOSKELETAL:no cyanosis of digits and no clubbing  NEURO: alert & oriented x 3 with fluent speech, no focal motor/sensory deficits EXTREMITIES: No lower extremity edema  LABORATORY DATA:  I have reviewed the data as listed CMP Latest Ref Rng & Units 04/27/2017 04/07/2017 03/17/2017  Glucose 70 - 140 mg/dL 93 169(H) 172(H)  BUN 7 - 26  mg/dL 11 14 13.0  Creatinine 0.6 - 1.1 mg/dL - - 0.9  Sodium 136 - 145 mmol/L 140 140 140  Potassium 3.5 - 5.1 mmol/L 3.8 4.2 4.0  Chloride 98 - 109 mmol/L 104 106 -  CO2 22 - 29 mmol/L 27 24 22   Calcium 8.4 - 10.4 mg/dL 8.6 8.8 8.5  Total Protein 6.4 - 8.3 g/dL 6.7 7.2 6.9  Total Bilirubin 0.2 - 1.2 mg/dL 0.3 0.4 0.34  Alkaline Phos 40 - 150 U/L 93 83 70  AST 5 - 34 U/L 22 21 23   ALT 0 - 55 U/L 22 21 20     Lab Results  Component Value Date   WBC 6.0 04/27/2017   HGB 10.0 (L) 03/17/2017   HCT 29.5 (L) 04/27/2017   MCV 89.0 04/27/2017   PLT 178 04/27/2017   NEUTROABS 3.7 04/27/2017    ASSESSMENT & PLAN:  Malignant neoplasm of overlapping sites of right breast in female,  estrogen receptor positive ([place]) 01/12/2017:Left mastectomy: IDC with DCIS, 2 tumors 3.6 cm ER 90%, PR 95%, Ki-67 5%, HER-2 negative ratio 1.32) and 1.2 cm (ER 95% PR 95% positive Her 2 Positive ratio 3.2, Ki-67 5%) T2 N0 stage Ib Right mastectomy: Invasive and in situ lobular carcinoma involves the dermis of the skin of the nipple, margins negative, 3/3 lymph nodes positive, grade 1, EF 75%, PR 95%, HER-2 negative ratio 1.19, Ki-67 10%, T3N1A stage IIa  Treatment plan: 1. Adjuvant chemotherapy with TCH followed by Herceptin maintenance for 1 year 2. Adjuvant radiation therapy 3. Followed by adjuvant antiestrogen therapy __________________________________________________________________________________________ Echo on 02/01/17: 55% - 60% Current treatment: Cycle4day 1TCH Chemo toxicities: 1.Fatigue 2. nausea grade 1 3.Bone pain related to Neulasta in spite of Claritin 4.Chemotherapy-induced anemia: Being monitored I encouraged her to eat iron rich foods.  Iron studies did not show any significant iron deficiency.  RTC in 3 weeks for cycle 5   I spent 25 minutes talking to the patient of which more than half was spent in counseling and coordination of care.  No orders of the defined types were placed in this encounter.  The patient has a good understanding of the overall plan. she agrees with it. she will call with any problems that may develop before the next visit here.   Harriette Ohara, MD 04/27/17

## 2017-04-28 ENCOUNTER — Ambulatory Visit: Payer: 59 | Admitting: Hematology and Oncology

## 2017-04-28 ENCOUNTER — Ambulatory Visit: Payer: 59

## 2017-04-28 ENCOUNTER — Other Ambulatory Visit: Payer: 59

## 2017-05-06 ENCOUNTER — Encounter: Payer: Self-pay | Admitting: Hematology and Oncology

## 2017-05-09 ENCOUNTER — Encounter: Payer: Self-pay | Admitting: Hematology and Oncology

## 2017-05-10 ENCOUNTER — Inpatient Hospital Stay: Payer: 59

## 2017-05-10 ENCOUNTER — Inpatient Hospital Stay (HOSPITAL_BASED_OUTPATIENT_CLINIC_OR_DEPARTMENT_OTHER): Payer: 59 | Admitting: Medical

## 2017-05-10 ENCOUNTER — Telehealth: Payer: Self-pay | Admitting: *Deleted

## 2017-05-10 VITALS — BP 114/72 | HR 96 | Temp 98.4°F | Resp 18 | Ht 66.0 in | Wt 153.0 lb

## 2017-05-10 DIAGNOSIS — C50811 Malignant neoplasm of overlapping sites of right female breast: Secondary | ICD-10-CM

## 2017-05-10 DIAGNOSIS — D6481 Anemia due to antineoplastic chemotherapy: Secondary | ICD-10-CM

## 2017-05-10 DIAGNOSIS — G4700 Insomnia, unspecified: Secondary | ICD-10-CM

## 2017-05-10 DIAGNOSIS — R21 Rash and other nonspecific skin eruption: Secondary | ICD-10-CM

## 2017-05-10 DIAGNOSIS — Z5112 Encounter for antineoplastic immunotherapy: Secondary | ICD-10-CM | POA: Diagnosis not present

## 2017-05-10 DIAGNOSIS — C50912 Malignant neoplasm of unspecified site of left female breast: Secondary | ICD-10-CM | POA: Diagnosis not present

## 2017-05-10 LAB — CBC WITH DIFFERENTIAL (CANCER CENTER ONLY)
Basophils Absolute: 0 10*3/uL (ref 0.0–0.1)
Basophils Relative: 0 %
Eosinophils Absolute: 0 10*3/uL (ref 0.0–0.5)
Eosinophils Relative: 0 %
HEMATOCRIT: 27.6 % — AB (ref 34.8–46.6)
HEMOGLOBIN: 8.9 g/dL — AB (ref 11.6–15.9)
Lymphocytes Relative: 18 %
Lymphs Abs: 1.2 10*3/uL (ref 0.9–3.3)
MCH: 29.6 pg (ref 25.1–34.0)
MCHC: 32.2 g/dL (ref 31.5–36.0)
MCV: 91.7 fL (ref 79.5–101.0)
MONOS PCT: 6 %
Monocytes Absolute: 0.4 10*3/uL (ref 0.1–0.9)
NEUTROS ABS: 5 10*3/uL (ref 1.5–6.5)
NEUTROS PCT: 76 %
Platelet Count: 115 10*3/uL — ABNORMAL LOW (ref 145–400)
RBC: 3.01 MIL/uL — ABNORMAL LOW (ref 3.70–5.45)
RDW: 16.8 % — ABNORMAL HIGH (ref 11.2–14.5)
WBC Count: 6.6 10*3/uL (ref 3.9–10.3)

## 2017-05-10 MED ORDER — PREDNISONE 10 MG (21) PO TBPK
ORAL_TABLET | ORAL | 0 refills | Status: DC
Start: 2017-05-10 — End: 2017-06-09

## 2017-05-10 MED ORDER — HEPARIN SOD (PORK) LOCK FLUSH 100 UNIT/ML IV SOLN
500.0000 [IU] | Freq: Once | INTRAVENOUS | Status: AC
  Administered 2017-05-10: 500 [IU] via INTRAVENOUS
  Filled 2017-05-10: qty 5

## 2017-05-10 MED ORDER — SODIUM CHLORIDE 0.9 % IV SOLN
Freq: Once | INTRAVENOUS | Status: AC
  Administered 2017-05-10: 15:00:00 via INTRAVENOUS

## 2017-05-10 MED ORDER — ZOLPIDEM TARTRATE 5 MG PO TABS: 5.0000 mg | ORAL_TABLET | Freq: Every evening | ORAL | 0 refills | Status: DC | PRN

## 2017-05-10 MED ORDER — SODIUM CHLORIDE 0.9 % IV SOLN
20.0000 mg | Freq: Once | INTRAVENOUS | Status: AC
  Administered 2017-05-10: 20 mg via INTRAVENOUS
  Filled 2017-05-10: qty 2

## 2017-05-10 MED ORDER — TRIAMCINOLONE ACETONIDE 0.1 % EX LOTN: 1.0000 "application " | TOPICAL_LOTION | Freq: Three times a day (TID) | CUTANEOUS | 0 refills | Status: DC

## 2017-05-10 MED ORDER — SODIUM CHLORIDE 0.9% FLUSH
10.0000 mL | Freq: Once | INTRAVENOUS | Status: AC
  Administered 2017-05-10: 10 mL via INTRAVENOUS
  Filled 2017-05-10: qty 10

## 2017-05-10 NOTE — Progress Notes (Signed)
[facility] Progress Note   Amanda Williams [id number] October 13, 1965 52 y.o.  Amanda Williams is managed by Dr. Nicholas Lose  Actively treated with chemotherapy: yes  Current Therapy: [facility]  Last Treated: 05/10/2017  Assessment: Plan:    Rash - Plan: dexamethasone (DECADRON) 20 mg in sodium chloride 0.9 % 50 mL IVPB, CBC with Differential ([place] Only), predniSONE (STERAPRED UNI-PAK 21 TAB) 10 MG (21) TBPK tablet, triamcinolone lotion (KENALOG) 0.1 %   Taxotere rash: This case was discussed with Dr. Lindi Adie.  The patient was given Decadron 20 mg IV x1, a CBC was ordered with results which were relatively stable except for her hemoglobin which was slightly lower at 8.9 and a platelet count slightly lower at 115.  She was given a 6-day prednisone taper and a prescription for triamcinolone 0.1% lotion to use 3 times daily.  Additionally she was given a prescription for Ambien 5 mg p.o. nightly as needed insomnia since she received Decadron today and will be on prednisone for 6 days beginning tomorrow.  Please see After Visit Summary for patient specific instructions.  Future Appointments  Date Time Provider [place]  05/19/2017  9:15 AM [facility] LAB 1 [facility] None  05/19/2017  9:30 AM [facility] FLUSH NURSE [facility] None  05/19/2017 10:00 AM Gardenia Phlegm, NP [facility] None  05/19/2017 11:00 AM [facility] H30 [facility] None  06/09/2017  8:30 AM [facility] FLUSH NURSE 2 [facility] None  06/09/2017  9:00 AM Causey, Charlestine Massed, NP [facility] None  06/09/2017 10:00 AM [facility] B5 [facility] None  06/30/2017  8:30 AM [facility] FLUSH NURSE 2 [facility] None  06/30/2017  9:00 AM Nicholas Lose, MD [facility] None  06/30/2017 10:00 AM [facility] A3 [facility] None  07/21/2017  8:30 AM [facility] INJ NURSE [facility] None  07/21/2017  9:00 AM Nicholas Lose, MD [facility] None  07/21/2017 10:00 AM [facility] B5 [facility] None    08/11/2017  8:30 AM [facility] FLUSH NURSE 2 [facility] None  08/11/2017  9:00 AM Causey, Charlestine Massed, NP [facility] None  08/11/2017 10:00 AM [facility] B6 [facility] None    Orders Placed This Encounter  Procedures  . CBC with Differential (Cancer Center Only)       Subjective:   Patient ID:  Amanda Williams is a 52 y.o. (DOB 12-15-1965) female.  Chief Complaint:  Chief Complaint  Patient presents with  . skin changes hands and feet    HPI Amanda Williams is a 52 year old female with a's stage IIa (T3, N1a) ER/PR positive and HER-2/neu negative malignant neoplasm of the left breast who is status post cycle #4 of [place].  She developed a pruritic rash over her hands, arms, and feet around week #2 after her third cycle of chemotherapy.  She has again developed a similar rash at 2 weeks status post cycle 4 of [place].  She denies shortness of breath, chest tightness, oral tenderness, or difficulty swallowing.  Medications: I have reviewed the patient's current medications.  Allergies:  Allergies  Allergen Reactions  . Penicillins Hives and Other (See Comments)    Has patient had a PCN reaction causing immediate rash, facial/tongue/throat swelling, SOB or lightheadedness with hypotension: No HAS PT DEVELOPED SEVERE RASH INVOLVING MUCUS MEMBRANES or SKIN NECROSIS: #  #  #  YES  #  #  #   Has patient had a PCN reaction that required hospitalization: No Has patient had a PCN reaction occurring within the last 10 years: No If all of the above answers are "  NO", then may proceed with Cephalosporin use.   . Sulfonamide Derivatives Hives  . Adhesive [Tape] Other (See Comments)    Redness     Past Medical History:  Diagnosis Date  . Allergy   . Breast cancer ([place])   . Difficult intubation    see 01/12/17 anesthesia record  . Family history of breast cancer   . Hematuria    ? trigonitis  . Panic attack    echo 2009 normal    Past Surgical History:  Procedure Laterality Date   . BREAST BIOPSY     left  . BREAST RECONSTRUCTION WITH PLACEMENT OF TISSUE EXPANDER AND FLEX HD (ACELLULAR HYDRATED DERMIS) Bilateral 01/12/2017   Procedure: BREAST RECONSTRUCTION WITH PLACEMENT OF TISSUE EXPANDER AND ALLODERM;  Surgeon: Irene Limbo, MD;  Location: [employer];  Service: Plastics;  Laterality: Bilateral;  . MASTECTOMY W/ SENTINEL NODE BIOPSY Bilateral 01/12/2017   Procedure: RIGHT MODIFIED RADICAL MASTECTOMY, LEFT TOTAL MASTECTOMY WITH LEFT SENTINEL LYMPH NODE BIOPSY;  Surgeon: Rolm Bookbinder, MD;  Location: [place];  Service: General;  Laterality: Bilateral;  . PORTACATH PLACEMENT N/A 02/15/2017   Procedure: INSERTION PORT-A-CATH WITH [place];  Surgeon: Rolm Bookbinder, MD;  Location: [place];  Service: General;  Laterality: N/A;    Family History  Problem Relation Age of Onset  . Breast cancer Maternal Grandmother 87    Social History   Socioeconomic History  . Marital status: Married    Spouse name: Not on file  . Number of children: Not on file  . Years of education: Not on file  . Highest education level: Not on file  Social Needs  . Financial resource strain: Not on file  . Food insecurity - worry: Not on file  . Food insecurity - inability: Not on file  . Transportation needs - medical: Not on file  . Transportation needs - non-medical: Not on file  Occupational History  . Not on file  Tobacco Use  . Smoking status: Never Smoker  . Smokeless tobacco: Never Used  Substance and Sexual Activity  . Alcohol use: Yes    Alcohol/week: 2.4 oz    Types: 4 Glasses of wine per week    Comment: ocassionally  . Drug use: No  . Sexual activity: Not on file    Comment: Husband has had vasectomy  Other Topics Concern  . Not on file  Social History Narrative   hhof  4    Pets dog and cat.   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    Has smoke detector and wears seat belts.  No firearms.  No excess sun exposure. Sees dentist regularly .           Past Medical History, Surgical history, Social history, and Family history were reviewed and updated as appropriate.   Please see review of systems for further details on the patient's review from today.   Review of Systems:  Review of Systems  Constitutional: Negative for chills, diaphoresis and fever.  HENT: Negative for trouble swallowing.   Respiratory: Negative for cough, choking, chest tightness and shortness of breath.   Cardiovascular: Negative for chest pain.  Skin: Positive for rash (Hands, arms, and feet.).    Objective:   Physical Exam:  BP 114/72 (BP Location: Left Arm, Patient Position: Sitting)   Pulse 96   Temp 98.4 F (  36.9 C) (Oral)   Resp 18   Ht 5' 6"  (1.676 m)   Wt 153 lb (69.4 kg)   SpO2 100%   BMI 24.69 kg/m  ECOG: 0  Physical Exam  Constitutional: No distress.  HENT:  Head: Normocephalic and atraumatic.  Cardiovascular: Normal rate, regular rhythm and normal heart sounds. Exam reveals no gallop and no friction rub.  No murmur heard. Pulmonary/Chest: Effort normal and breath sounds normal. No respiratory distress. She has no wheezes. She has no rales.  Skin: Skin is warm and dry. Rash noted. She is not diaphoretic.  A diffuse, erythematous, and slightly raised rash is noted over the dorsal hands and arms bilaterally.  A purplish macular rash is noted over the medial and lateral feet.  There is an area of clearing where it appears that the skin has peeled off.  No increased warmth is noted.                  Lab Review:     Component Value Date/Time   NA 140 04/27/2017 0929   NA 140 03/17/2017 1008   K 3.8 04/27/2017 0929   K 4.0 03/17/2017 1008   CL 104 04/27/2017 0929   CO2 27 04/27/2017 0929   CO2 22 03/17/2017 1008   GLUCOSE 93 04/27/2017 0929   GLUCOSE 172 (H) 03/17/2017 1008   BUN 11 04/27/2017 0929   BUN 13.0 03/17/2017 1008   CREATININE 0.79 04/27/2017 0929    CREATININE 0.9 03/17/2017 1008   CALCIUM 8.6 04/27/2017 0929   CALCIUM 8.5 03/17/2017 1008   PROT 6.7 04/27/2017 0929   PROT 6.9 03/17/2017 1008   ALBUMIN 3.5 04/27/2017 0929   ALBUMIN 3.6 03/17/2017 1008   AST 22 04/27/2017 0929   AST 23 03/17/2017 1008   ALT 22 04/27/2017 0929   ALT 20 03/17/2017 1008   ALKPHOS 93 04/27/2017 0929   ALKPHOS 70 03/17/2017 1008   BILITOT 0.3 04/27/2017 0929   BILITOT 0.34 03/17/2017 1008   GFRNONAA >60 04/27/2017 0929   GFRAA >60 04/27/2017 0929       Component Value Date/Time   WBC 6.6 05/10/2017 1440   WBC 4.3 03/17/2017 1008   WBC 8.6 02/15/2017 0631   RBC 3.01 (L) 05/10/2017 1440   HGB 10.0 (L) 03/17/2017 1008   HCT 27.6 (L) 05/10/2017 1440   HCT 31.2 (L) 03/17/2017 1008   PLT 115 (L) 05/10/2017 1440   PLT 245 03/17/2017 1008   MCV 91.7 05/10/2017 1440   MCV 89.7 03/17/2017 1008   MCH 29.6 05/10/2017 1440   MCHC 32.2 05/10/2017 1440   RDW 16.8 (H) 05/10/2017 1440   RDW 13.7 03/17/2017 1008   LYMPHSABS 1.2 05/10/2017 1440   LYMPHSABS 0.7 (L) 03/17/2017 1008   MONOABS 0.4 05/10/2017 1440   MONOABS 0.1 03/17/2017 1008   EOSABS 0.0 05/10/2017 1440   EOSABS 0.0 03/17/2017 1008   BASOSABS 0.0 05/10/2017 1440   BASOSABS 0.0 03/17/2017 1008   -------------------------------  Imaging from last 24 hours (if applicable):  Radiology interpretation: No results found.      This case was discussed with Dr. Lindi Adie. He expresses agreement with my management of this patient.

## 2017-05-10 NOTE — Telephone Encounter (Signed)
Images reviewed with Dr Lindi Adie.  He states patient needs to be seen by [facility] or NP for possible drug reaction.  Patient was scheduled to see Sandi Mealy, PA in [facility] for 05/10/2017

## 2017-05-11 ENCOUNTER — Ambulatory Visit: Payer: 59

## 2017-05-11 ENCOUNTER — Other Ambulatory Visit: Payer: 59

## 2017-05-11 ENCOUNTER — Ambulatory Visit: Payer: 59 | Admitting: Hematology and Oncology

## 2017-05-18 ENCOUNTER — Other Ambulatory Visit: Payer: Self-pay

## 2017-05-18 DIAGNOSIS — C50812 Malignant neoplasm of overlapping 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 breast: Secondary | ICD-10-CM

## 2017-05-18 DIAGNOSIS — Z170 Estrogen receptor positive status [ER+]: Secondary | ICD-10-CM

## 2017-05-19 ENCOUNTER — Inpatient Hospital Stay: Payer: 59

## 2017-05-19 ENCOUNTER — Telehealth: Payer: Self-pay | Admitting: Adult Health

## 2017-05-19 ENCOUNTER — Encounter: Payer: Self-pay | Admitting: Adult Health

## 2017-05-19 ENCOUNTER — Inpatient Hospital Stay (HOSPITAL_BASED_OUTPATIENT_CLINIC_OR_DEPARTMENT_OTHER): Payer: 59 | Admitting: Adult Health

## 2017-05-19 VITALS — BP 110/74 | HR 103 | Temp 98.9°F | Resp 18 | Ht 66.0 in | Wt 153.3 lb

## 2017-05-19 DIAGNOSIS — Z5112 Encounter for antineoplastic immunotherapy: Secondary | ICD-10-CM | POA: Diagnosis not present

## 2017-05-19 DIAGNOSIS — G620 Drug-induced polyneuropathy: Secondary | ICD-10-CM | POA: Diagnosis not present

## 2017-05-19 DIAGNOSIS — C50012 Malignant neoplasm of nipple and areola, left female breast: Principal | ICD-10-CM

## 2017-05-19 DIAGNOSIS — Z170 Estrogen receptor positive status [ER+]: Secondary | ICD-10-CM | POA: Diagnosis not present

## 2017-05-19 DIAGNOSIS — C50812 Malignant neoplasm of overlapping 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 breast: Secondary | ICD-10-CM

## 2017-05-19 DIAGNOSIS — R21 Rash and other nonspecific skin eruption: Secondary | ICD-10-CM

## 2017-05-19 DIAGNOSIS — C50811 Malignant neoplasm of overlapping sites of right female breast: Secondary | ICD-10-CM | POA: Diagnosis not present

## 2017-05-19 DIAGNOSIS — C50011 Malignant neoplasm of nipple and areola, right female breast: Secondary | ICD-10-CM

## 2017-05-19 LAB — CMP (CANCER CENTER ONLY)
ALT: 22 U/L (ref 0–55)
ANION GAP: 8 (ref 3–11)
AST: 16 U/L (ref 5–34)
Albumin: 3.2 g/dL — ABNORMAL LOW (ref 3.5–5.0)
Alkaline Phosphatase: 76 U/L (ref 40–150)
BILIRUBIN TOTAL: 0.3 mg/dL (ref 0.2–1.2)
BUN: 19 mg/dL (ref 7–26)
CHLORIDE: 103 mmol/L (ref 98–109)
CO2: 28 mmol/L (ref 22–29)
Calcium: 8.6 mg/dL (ref 8.4–10.4)
Creatinine: 0.8 mg/dL (ref 0.60–1.10)
GFR, Est AFR Am: >60 mL/min (ref >60)
Glucose, Bld: 116 mg/dL (ref 70–140)
POTASSIUM: 3.4 mmol/L — AB (ref 3.5–5.1)
Sodium: 139 mmol/L (ref 136–145)
TOTAL PROTEIN: 6.2 g/dL — AB (ref 6.4–8.3)

## 2017-05-19 LAB — CBC WITH DIFFERENTIAL (CANCER CENTER ONLY)
BASOS ABS: 0 10*3/uL (ref 0.0–0.1)
Basophils Relative: 0 %
Eosinophils Absolute: 0 10*3/uL (ref 0.0–0.5)
Eosinophils Relative: 0 %
HEMATOCRIT: 31.2 % — AB (ref 34.8–46.6)
HEMOGLOBIN: 10 g/dL — AB (ref 11.6–15.9)
LYMPHS ABS: 2.1 10*3/uL (ref 0.9–3.3)
Lymphocytes Relative: 40 %
MCH: 30.3 pg (ref 25.1–34.0)
MCHC: 32.1 g/dL (ref 31.5–36.0)
MCV: 94.5 fL (ref 79.5–101.0)
Monocytes Absolute: 0.5 10*3/uL (ref 0.1–0.9)
Monocytes Relative: 10 %
NEUTROS ABS: 2.5 10*3/uL (ref 1.5–6.5)
NEUTROS PCT: 50 %
PLATELETS: 248 10*3/uL (ref 145–400)
RBC: 3.3 MIL/uL — ABNORMAL LOW (ref 3.70–5.45)
RDW: 18.4 % — ABNORMAL HIGH (ref 11.2–14.5)
WBC: 5.2 10*3/uL (ref 3.9–10.3)

## 2017-05-19 MED ORDER — DEXAMETHASONE SODIUM PHOSPHATE 10 MG/ML IJ SOLN
INTRAMUSCULAR | Status: AC
  Filled 2017-05-19: qty 1

## 2017-05-19 MED ORDER — SODIUM CHLORIDE 0.9 % IV SOLN
620.0000 mg | Freq: Once | INTRAVENOUS | Status: AC
  Administered 2017-05-19: 620 mg via INTRAVENOUS
  Filled 2017-05-19: qty 62

## 2017-05-19 MED ORDER — ACETAMINOPHEN 325 MG PO TABS
650.0000 mg | ORAL_TABLET | Freq: Once | ORAL | Status: AC
  Administered 2017-05-19: 650 mg via ORAL

## 2017-05-19 MED ORDER — PALONOSETRON HCL INJECTION 0.25 MG/5ML
0.2500 mg | Freq: Once | INTRAVENOUS | Status: AC
  Administered 2017-05-19: 0.25 mg via INTRAVENOUS

## 2017-05-19 MED ORDER — DIPHENHYDRAMINE HCL 50 MG/ML IJ SOLN
INTRAMUSCULAR | Status: AC
  Filled 2017-05-19: qty 1

## 2017-05-19 MED ORDER — DEXAMETHASONE SODIUM PHOSPHATE 10 MG/ML IJ SOLN
10.0000 mg | Freq: Once | INTRAMUSCULAR | Status: AC
  Administered 2017-05-19: 10 mg via INTRAVENOUS

## 2017-05-19 MED ORDER — SODIUM CHLORIDE 0.9% FLUSH
10.0000 mL | Freq: Once | INTRAVENOUS | Status: AC
  Administered 2017-05-19: 10 mL
  Filled 2017-05-19: qty 10

## 2017-05-19 MED ORDER — SODIUM CHLORIDE 0.9 % IV SOLN
Freq: Once | INTRAVENOUS | Status: AC
  Administered 2017-05-19: 11:00:00 via INTRAVENOUS

## 2017-05-19 MED ORDER — HEPARIN SOD (PORK) LOCK FLUSH 100 UNIT/ML IV SOLN
500.0000 [IU] | Freq: Once | INTRAVENOUS | Status: AC | PRN
  Administered 2017-05-19: 500 [IU]
  Filled 2017-05-19: qty 5

## 2017-05-19 MED ORDER — DIPHENHYDRAMINE HCL 25 MG PO CAPS
ORAL_CAPSULE | ORAL | Status: AC
Start: 2017-05-19 — End: ?
  Filled 2017-05-19: qty 2

## 2017-05-19 MED ORDER — DIPHENHYDRAMINE HCL 25 MG PO CAPS
50.0000 mg | ORAL_CAPSULE | Freq: Once | ORAL | Status: AC
  Administered 2017-05-19: 50 mg via ORAL

## 2017-05-19 MED ORDER — ACETAMINOPHEN 325 MG PO TABS
ORAL_TABLET | ORAL | Status: AC
  Filled 2017-05-19: qty 2

## 2017-05-19 MED ORDER — SODIUM CHLORIDE 0.9% FLUSH
10.0000 mL | INTRAVENOUS | Status: DC | PRN
  Administered 2017-05-19: 10 mL
  Filled 2017-05-19: qty 10

## 2017-05-19 MED ORDER — TRASTUZUMAB CHEMO 150 MG IV SOLR
6.0000 mg/kg | Freq: Once | INTRAVENOUS | Status: AC
  Administered 2017-05-19: 399 mg via INTRAVENOUS
  Filled 2017-05-19: qty 19

## 2017-05-19 MED ORDER — PALONOSETRON HCL INJECTION 0.25 MG/5ML
INTRAVENOUS | Status: AC
  Filled 2017-05-19: qty 5

## 2017-05-19 NOTE — Assessment & Plan Note (Addendum)
01/12/2017:Left mastectomy: IDC with DCIS, 2 tumors 3.6 cm ER 90%, PR 95%, Ki-67 5%, HER-2 negative ratio 1.32) and 1.2 cm (ER 95% PR 95% positive Her 2 Positive ratio 3.2, Ki-67 5%) T2 N0 stage Ib Right mastectomy: Invasive and in situ lobular carcinoma involves the dermis of the skin of the nipple, margins negative, 3/3 lymph nodes positive, grade 1, EF 75%, PR 95%, HER-2 negative ratio 1.19, Ki-67 10%, T3N1A stage IIa  Treatment plan: 1. Adjuvant chemotherapy with TCH followed by Herceptin maintenance for 1 year 2. Adjuvant radiation therapy 3. Followed by adjuvant antiestrogen therapy __________________________________________________________________________________________ Echo on 02/01/17: 55% - 60% Current treatment: Cycle5day 1TCH Chemo toxicities: Due to peripheral neuropathy and plantar erythrodysesthesia, patient will stop receiving Taxotere.  She met with Dr. Lindi Adie today and he reviewed this with her in detail, including stopping Neulasta. She will continue with Carboplatin and Herceptin x 2 more cycles, and then go on to maintenance Herceptin.  I recommended Systane eye gtts for her watery eyes four times a day.   Amanda Williams will return in 3 weeks for labs, f/u, and cycle 6 of treatment.

## 2017-05-19 NOTE — Telephone Encounter (Signed)
No 2/27 los.  

## 2017-05-19 NOTE — Progress Notes (Addendum)
[facility] Follow up:    Amanda Medin, MD [place] [id number]   DIAGNOSIS: Cancer Staging Malignant neoplasm of overlapping 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 breast [facility]) Staging form: Breast, AJCC 8th Edition - Clinical: cT2, cN0, cM0, G1, ER: Unknown, PR: Unknown, HER2: Unknown - Unsigned - Pathologic: Stage IA (pT2, pN0, cM0, G1, ER: Positive, PR: Positive, HER2: Positive) - Signed by Gardenia Phlegm, NP on 01/27/2017  Malignant neoplasm of overlapping sites of right breast in female, estrogen receptor positive ([place]) Staging form: Breast, AJCC 8th Edition - Clinical stage from 12/23/2016: Stage IIIB (cT4, cN1, cM0, G1, ER: Positive, PR: Positive, HER2: Negative) - Unsigned - Pathologic: Stage IB (pT3, pN1a, cM0, G1, ER: Positive, PR: Positive, HER2: Negative) - Signed by Gardenia Phlegm, NP on 01/27/2017   SUMMARY OF ONCOLOGIC HISTORY:   Malignant neoplasm of overlapping 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 breast ([place])   01/12/2017 Surgery    Left mastectomy: IDC with DCIS, 2 tumors 3.6 cm ER 90%, PR 95%, Ki-67 5%, HER-2 negative ratio 1.32) and 1.2 cm (ER 95% PR 95% positive Her 2 Positive ratio 3.2, Ki-67 5%) T2 N0 stage Ib      02/16/2017 -  Chemotherapy    TCH x6 followed by Herceptin maintenance        Malignant neoplasm of overlapping sites of right breast in female, estrogen receptor positive ([place])   12/21/2016 Initial Diagnosis    Palpable lumps in the right breast with nipple inversion: Mammogram revealed skin thickening and distortion, ultrasound revealed 4 masses 3.1 cm at 11:00, 2.5 cm at 6:00, 4 cm at 8:00, 2.2 cm at 5:00 with 2 abnormal axillary lymph nodes: MRI revealed 7 cm abnormality on right breast, in addition 3.6 cm abnormality in the left breast and 2 additional masses 1 cm each; right breast: T4 N1 stage III B clinical stage; left breast: T2 N0 stage IB clinical stage      12/21/2016 Pathology Results    Right breast:  Grade 1 Invasive lobular cancer, lymph node also positive, ER 70%, PR 90%, Ki-67 10%, HER-2 negative ratio 1.09 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  The Multi-Cancer Panel was ordered. The Multi-Cancer Panel offered by [facility] includes sequencing and/or deletion duplication testing of the following 83 genes: ALK, APC, ATM, AXIN2,BAP1,  BARD1, BLM, BMPR1A, BRCA1, BRCA2, BRIP1, CASR, CDC73, CDH1, CDK4, CDKN1B, CDKN1C, CDKN2A (p14ARF), CDKN2A (p16INK4a), CEBPA, CHEK2, CTNNA1, DICER1, DIS3L2, EGFR (c.2369C>T, p.Thr790Met variant only), EPCAM (Deletion/duplication testing only), FH, FLCN, GATA2, GPC3, GREM1 (Promoter region deletion/duplication testing only), HOXB13 (c.251G>A, p.Gly84Glu), HRAS, KIT, MAX, MEN1, MET, MITF (c.952G>A, p.Glu318Lys variant only), MLH1, MSH2, MSH3, MSH6, MUTYH, NBN, NF1, NF2, NTHL1, PALB2, PDGFRA, PHOX2B, PMS2, POLD1, POLE, POT1, PRKAR1A, PTCH1, PTEN, RAD50, RAD51C, RAD51D, RB1, RECQL4, RET, RUNX1, SDHAF2, SDHA (sequence changes only), SDHB, SDHC, SDHD, SMAD4, SMARCA4, SMARCB1, SMARCE1, STK11, SUFU, TERC, TERT, TMEM127, TP53, TSC1, TSC2, VHL, WRN and WT1.   Results: Negative, no pathogenic variants identified.  The date of this test report is 01/04/2017.        01/12/2017 Surgery    Right mastectomy: Invasive and in situ lobular carcinoma involves the dermis of the skin of the nipple, margins negative, 3/3 lymph nodes positive, grade 1, EF 75%, PR 95%, HER-2 negative  ratio 1.19, Ki-67 10%, T3N1A stage IIa       CURRENT THERAPY: Taxotere Carbo, Herceptin  INTERVAL HISTORY: Amanda Williams 52 y.o. female returns for evaluation prior to receiving chemotherapy.  She was seen last Monday for a burning red rash on her feet.  She was unable to walk.  He prescribed Prednisone and a couple of different  creams to apply.  Her rash is improved today on her feet, but it is not completely resolved.  She says that she can walk on them now.  She also got a rash on her arm and on her hands.  This rash is now peeling.  She also has peripheral neuropathy in her fingertips and toes.  She does have difficulty with buttoning.  This has worsened since her fourth cycle.     Patient Active Problem List   Diagnosis Date Noted  . Bilateral breast cancer ([place]) 01/12/2017  . Genetic testing 01/05/2017  . Family history of breast cancer   . Malignant neoplasm of overlapping sites of right breast in female, estrogen receptor positive ([place]) 12/23/2016  . Malignant neoplasm of overlapping 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 breast ([place]) 12/18/2016  . Exposure to communicable disease 08/04/2012  . Counseling about travel 08/04/2012  . Throat symptom 04/12/2012  . Visit for preventive health examination 10/18/2010  . Dense breasts 10/18/2010  . Pelvic pressure in female 05/20/2010  . PANIC ATTACK 03/27/2008  . MICROSCOPIC HEMATURIA 03/27/2008  . PALPITATIONS 02/02/2007  . ALLERGIC RHINITIS 10/07/2006    is allergic to penicillins; sulfonamide derivatives; and adhesive [tape].  MEDICAL HISTORY: Past Medical History:  Diagnosis Date  . Allergy   . Breast cancer ([place])   . Difficult intubation    see 01/12/17 anesthesia record  . Family history of breast cancer   . Hematuria    ? trigonitis  . Panic attack    echo 2009 normal    SURGICAL HISTORY: Past Surgical History:  Procedure Laterality Date  . BREAST BIOPSY     left  . BREAST RECONSTRUCTION WITH PLACEMENT OF TISSUE EXPANDER AND FLEX HD (ACELLULAR HYDRATED DERMIS) Bilateral 01/12/2017   Procedure: BREAST RECONSTRUCTION WITH PLACEMENT OF TISSUE EXPANDER AND ALLODERM;  Surgeon: Irene Limbo, MD;  Location: [facility];  Service: Plastics;  Laterality: Bilateral;  . MASTECTOMY W/ SENTINEL NODE BIOPSY Bilateral 01/12/2017   Procedure: RIGHT  MODIFIED RADICAL MASTECTOMY, LEFT TOTAL MASTECTOMY WITH LEFT SENTINEL LYMPH NODE BIOPSY;  Surgeon: Rolm Bookbinder, MD;  Location: [facility];  Service: General;  Laterality: Bilateral;  . PORTACATH PLACEMENT N/A 02/15/2017   Procedure: INSERTION PORT-A-CATH WITH [place];  Surgeon: Rolm Bookbinder, MD;  Location: [facility];  Service: General;  Laterality: N/A;    SOCIAL HISTORY: Social History   Socioeconomic History  . Marital status: Married    Spouse name: Not on file  . Number of children: Not on file  . Years of education: Not on file  . Highest education level: Not on file  Social Needs  . Financial resource strain: Not on file  . Food insecurity - worry: Not on file  . Food insecurity - inability: Not on file  . Transportation needs - medical: Not on file  . Transportation needs - non-medical: Not on file  Occupational History  . Not on file  Tobacco Use  . Smoking status: Never Smoker  . Smokeless tobacco: Never Used  Substance and Sexual Activity  . Alcohol use: Yes    Alcohol/week: 2.4 oz  Types: 4 Glasses of wine per week    Comment: ocassionally  . Drug use: No  . Sexual activity: Not on file    Comment: Husband has had vasectomy  Other Topics Concern  . Not on file  Social History Narrative   hhof  4    Pets dog and cat.   Works [employer]- [employer] .     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    Has smoke detector and wears seat belts.  No firearms. No excess sun exposure. Sees dentist regularly .           FAMILY HISTORY: Family History  Problem Relation Age of Onset  . Breast cancer Maternal Grandmother 87    Review of Systems  Constitutional: Negative for appetite change, chills, fatigue, fever and unexpected weight change.  HENT:   Negative for hearing loss and lump/mass.   Eyes: Positive for eye problems (see interval hx). Negative for icterus.  Respiratory: Negative for chest tightness, cough and shortness of breath.    Cardiovascular: Negative for chest pain, leg swelling and palpitations.  Gastrointestinal: Negative for abdominal distention, abdominal pain, constipation, diarrhea, nausea and vomiting.  Endocrine: Negative for hot flashes.  Musculoskeletal: Negative for arthralgias.  Skin: Positive for rash. Negative for itching.  Neurological: Positive for numbness. Negative for dizziness, extremity weakness and headaches.  Hematological: Negative for adenopathy. Does not bruise/bleed easily.  Psychiatric/Behavioral: Negative for depression. The patient is not nervous/anxious.       PHYSICAL EXAMINATION  ECOG PERFORMANCE STATUS: 1 - Symptomatic but completely ambulatory  Vitals:   05/19/17 0954  BP: 110/74  Pulse: (!) 103  Resp: 18  Temp: 98.9 F (37.2 C)  SpO2: 99%    Physical Exam  Constitutional: She is oriented to person, place, and time and well-developed, well-nourished, and in no distress.  HENT:  Head: Normocephalic and atraumatic.  Mouth/Throat: Oropharynx is clear and moist. No oropharyngeal exudate.  Eyes: Pupils are equal, round, and reactive to light. No scleral icterus.  Neck: Neck supple.  Cardiovascular: Normal rate, regular rhythm and normal heart sounds.  Pulmonary/Chest: Effort normal and breath sounds normal.  Abdominal: Soft. Bowel sounds are normal. She exhibits no distension. There is no tenderness. There is no rebound and no guarding.  Musculoskeletal: She exhibits no edema.  Lymphadenopathy:    She has no cervical adenopathy.  Neurological: She is alert and oriented to person, place, and time.  Skin: Skin is warm and dry. No rash noted. No erythema.  Soles of feet are healing areas of erythema on medial and lateral aspects of soles  Psychiatric: Mood and affect normal.    LABORATORY DATA:  CBC    Component Value Date/Time   WBC 5.2 05/19/2017 0911   WBC 4.3 03/17/2017 1008   WBC 8.6 02/15/2017 0631   RBC 3.30 (L) 05/19/2017 0911   HGB 10.0 (L)  03/17/2017 1008   HCT 31.2 (L) 05/19/2017 0911   HCT 31.2 (L) 03/17/2017 1008   PLT 248 05/19/2017 0911   PLT 245 03/17/2017 1008   MCV 94.5 05/19/2017 0911   MCV 89.7 03/17/2017 1008   MCH 30.3 05/19/2017 0911   MCHC 32.1 05/19/2017 0911   RDW 18.4 (H) 05/19/2017 0911   RDW 13.7 03/17/2017 1008   LYMPHSABS 2.1 05/19/2017 0911   LYMPHSABS 0.7 (L) 03/17/2017 1008   MONOABS 0.5 05/19/2017 0911   MONOABS 0.1 03/17/2017 1008  EOSABS 0.0 05/19/2017 0911   EOSABS 0.0 03/17/2017 1008   BASOSABS 0.0 05/19/2017 0911   BASOSABS 0.0 03/17/2017 1008    CMP     Component Value Date/Time   NA 139 05/19/2017 0911   NA 140 03/17/2017 1008   K 3.4 (L) 05/19/2017 0911   K 4.0 03/17/2017 1008   CL 103 05/19/2017 0911   CO2 28 05/19/2017 0911   CO2 22 03/17/2017 1008   GLUCOSE 116 05/19/2017 0911   GLUCOSE 172 (H) 03/17/2017 1008   BUN 19 05/19/2017 0911   BUN 13.0 03/17/2017 1008   CREATININE 0.80 05/19/2017 0911   CREATININE 0.9 03/17/2017 1008   CALCIUM 8.6 05/19/2017 0911   CALCIUM 8.5 03/17/2017 1008   PROT 6.2 (L) 05/19/2017 0911   PROT 6.9 03/17/2017 1008   ALBUMIN 3.2 (L) 05/19/2017 0911   ALBUMIN 3.6 03/17/2017 1008   AST 16 05/19/2017 0911   AST 23 03/17/2017 1008   ALT 22 05/19/2017 0911   ALT 20 03/17/2017 1008   ALKPHOS 76 05/19/2017 0911   ALKPHOS 70 03/17/2017 1008   BILITOT 0.3 05/19/2017 0911   BILITOT 0.34 03/17/2017 1008   GFRNONAA >60 05/19/2017 0911   GFRAA >60 05/19/2017 0911      ASSESSMENT and PLAN:   Malignant neoplasm of overlapping sites of right breast in female, estrogen receptor positive ([place]) 01/12/2017:Left mastectomy: IDC with DCIS, 2 tumors 3.6 cm ER 90%, PR 95%, Ki-67 5%, HER-2 negative ratio 1.32) and 1.2 cm (ER 95% PR 95% positive Her 2 Positive ratio 3.2, Ki-67 5%) T2 N0 stage Ib Right mastectomy: Invasive and in situ lobular carcinoma involves the dermis of the skin of the nipple, margins negative, 3/3 lymph nodes positive, grade 1, EF  75%, PR 95%, HER-2 negative ratio 1.19, Ki-67 10%, T3N1A stage IIa  Treatment plan: 1. Adjuvant chemotherapy with TCH followed by Herceptin maintenance for 1 year 2. Adjuvant radiation therapy 3. Followed by adjuvant antiestrogen therapy __________________________________________________________________________________________ Echo on 02/01/17: 55% - 60% Current treatment: Cycle5day 1TCH Chemo toxicities: Due to peripheral neuropathy and plantar erythrodysesthesia, patient will stop receiving Taxotere.  She met with Dr. Lindi Adie today and he reviewed this with her in detail, including stopping Neulasta. She will continue with Carboplatin and Herceptin x 2 more cycles, and then go on to maintenance Herceptin.  I recommended Systane eye gtts for her watery eyes four times a day.   Alyvia will return in 3 weeks for labs, f/u, and cycle 6 of treatment.        All questions were answered. The patient knows to call the clinic with any problems, questions or concerns. We can certainly see the patient much sooner if necessary.  A total of (30) minutes of face-to-face time was spent with this patient with greater than 50% of that time in counseling and care-coordination.  This note was electronically signed. Scot Dock, NP 05/19/2017   Attending Note  I personally saw and examined Amanda Williams. The plan of care was discussed with her. I agree with the assessment and plan as documented above. Patient has normal peripheral neuropathy, hand-foot syndrome from Taxotere. We will discontinue Taxotere and she will continue with carboplatin and Herceptin. She does not need Neulasta either. Signed Harriette Ohara, MD

## 2017-05-19 NOTE — Patient Instructions (Signed)
[facility] Discharge Instructions for Patients Receiving Chemotherapy  Today you received the following chemotherapy agents:  Herceptin, Carboplatin  To help prevent nausea and vomiting after your treatment, we encourage you to take your nausea medication as prescribed.   If you develop nausea and vomiting that is not controlled by your nausea medication, call the clinic.   BELOW ARE SYMPTOMS THAT SHOULD BE REPORTED IMMEDIATELY:  *FEVER GREATER THAN 100.5 F  *CHILLS WITH OR WITHOUT FEVER  NAUSEA AND VOMITING THAT IS NOT CONTROLLED WITH YOUR NAUSEA MEDICATION  *UNUSUAL SHORTNESS OF BREATH  *UNUSUAL BRUISING OR BLEEDING  TENDERNESS IN MOUTH AND THROAT WITH OR WITHOUT PRESENCE OF ULCERS  *URINARY PROBLEMS  *BOWEL PROBLEMS  UNUSUAL RASH Items with * indicate a potential emergency and should be followed up as soon as possible.  Feel free to call the clinic should you have any questions or concerns. The clinic phone number is [phone].  Please show the [place] at check-in to the Emergency Department and triage nurse.

## 2017-06-08 ENCOUNTER — Other Ambulatory Visit: Payer: Self-pay | Admitting: *Deleted

## 2017-06-08 ENCOUNTER — Telehealth: Payer: Self-pay

## 2017-06-08 DIAGNOSIS — C50812 Malignant neoplasm of overlapping 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 breast: Secondary | ICD-10-CM

## 2017-06-08 DIAGNOSIS — Z170 Estrogen receptor positive status [ER+]: Secondary | ICD-10-CM

## 2017-06-08 NOTE — Telephone Encounter (Signed)
Returned pt call and left VM for patient regarding dental work. Informed her she needs to have dental procedures prior to her chemotherapy appointments. If they are unable to do that she would need to wait two weeks after her chemotherapy. She is to call with any other questions/concerns.  Cyndia Bent RN

## 2017-06-09 ENCOUNTER — Inpatient Hospital Stay: Payer: 59

## 2017-06-09 ENCOUNTER — Inpatient Hospital Stay: Payer: 59 | Attending: Hematology and Oncology

## 2017-06-09 ENCOUNTER — Encounter: Payer: Self-pay | Admitting: Radiation Oncology

## 2017-06-09 ENCOUNTER — Encounter: Payer: Self-pay | Admitting: Adult Health

## 2017-06-09 ENCOUNTER — Inpatient Hospital Stay (HOSPITAL_BASED_OUTPATIENT_CLINIC_OR_DEPARTMENT_OTHER): Payer: 59 | Admitting: Adult Health

## 2017-06-09 VITALS — BP 116/66 | HR 79 | Temp 98.3°F | Resp 18 | Ht 66.0 in | Wt 152.9 lb

## 2017-06-09 DIAGNOSIS — D709 Neutropenia, unspecified: Secondary | ICD-10-CM | POA: Diagnosis not present

## 2017-06-09 DIAGNOSIS — Z5112 Encounter for antineoplastic immunotherapy: Secondary | ICD-10-CM | POA: Insufficient documentation

## 2017-06-09 DIAGNOSIS — Z9012 Acquired absence of left breast and nipple: Secondary | ICD-10-CM | POA: Insufficient documentation

## 2017-06-09 DIAGNOSIS — C50812 Malignant neoplasm of overlapping 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 breast: Secondary | ICD-10-CM | POA: Diagnosis not present

## 2017-06-09 DIAGNOSIS — Z170 Estrogen receptor positive status [ER+]: Secondary | ICD-10-CM

## 2017-06-09 DIAGNOSIS — C50012 Malignant neoplasm of nipple and areola, left female breast: Principal | ICD-10-CM

## 2017-06-09 DIAGNOSIS — Z803 Family history of malignant neoplasm of breast: Secondary | ICD-10-CM | POA: Diagnosis not present

## 2017-06-09 DIAGNOSIS — Z9013 Acquired absence of bilateral breasts and nipples: Secondary | ICD-10-CM

## 2017-06-09 DIAGNOSIS — C50811 Malignant neoplasm of overlapping sites of right female breast: Secondary | ICD-10-CM | POA: Diagnosis not present

## 2017-06-09 DIAGNOSIS — C50011 Malignant neoplasm of nipple and areola, right female breast: Secondary | ICD-10-CM

## 2017-06-09 DIAGNOSIS — Z5111 Encounter for antineoplastic chemotherapy: Secondary | ICD-10-CM | POA: Diagnosis not present

## 2017-06-09 DIAGNOSIS — L271 Localized skin eruption due to drugs and medicaments taken internally: Secondary | ICD-10-CM | POA: Diagnosis not present

## 2017-06-09 LAB — CBC WITH DIFFERENTIAL (CANCER CENTER ONLY)
BASOS PCT: 0 %
Basophils Absolute: 0 10*3/uL (ref 0.0–0.1)
EOS PCT: 0 %
Eosinophils Absolute: 0 10*3/uL (ref 0.0–0.5)
HCT: 30.9 % — ABNORMAL LOW (ref 34.8–46.6)
HEMOGLOBIN: 10.3 g/dL — AB (ref 11.6–15.9)
LYMPHS ABS: 0.3 10*3/uL — AB (ref 0.9–3.3)
Lymphocytes Relative: 19 %
MCH: 31.2 pg (ref 25.1–34.0)
MCHC: 33.3 g/dL (ref 31.5–36.0)
MCV: 93.8 fL (ref 79.5–101.0)
Monocytes Absolute: 0 10*3/uL — ABNORMAL LOW (ref 0.1–0.9)
Monocytes Relative: 2 %
NEUTROS PCT: 79 %
Neutro Abs: 1.4 10*3/uL — ABNORMAL LOW (ref 1.5–6.5)
PLATELETS: 169 10*3/uL (ref 145–400)
RBC: 3.3 MIL/uL — AB (ref 3.70–5.45)
RDW: 19.4 % — ABNORMAL HIGH (ref 11.2–14.5)
WBC: 1.7 10*3/uL — AB (ref 3.9–10.3)

## 2017-06-09 LAB — CMP (CANCER CENTER ONLY)
ALK PHOS: 84 U/L (ref 40–150)
ALT: 31 U/L (ref 0–55)
AST: 24 U/L (ref 5–34)
Albumin: 3.6 g/dL (ref 3.5–5.0)
Anion gap: 10 (ref 3–11)
BILIRUBIN TOTAL: 0.4 mg/dL (ref 0.2–1.2)
BUN: 13 mg/dL (ref 7–26)
CALCIUM: 9 mg/dL (ref 8.4–10.4)
CO2: 24 mmol/L (ref 22–29)
CREATININE: 0.87 mg/dL (ref 0.60–1.10)
Chloride: 104 mmol/L (ref 98–109)
Glucose, Bld: 212 mg/dL — ABNORMAL HIGH (ref 70–140)
Potassium: 4.1 mmol/L (ref 3.5–5.1)
Sodium: 138 mmol/L (ref 136–145)
TOTAL PROTEIN: 6.8 g/dL (ref 6.4–8.3)

## 2017-06-09 MED ORDER — ACETAMINOPHEN 325 MG PO TABS
650.0000 mg | ORAL_TABLET | Freq: Once | ORAL | Status: AC
  Administered 2017-06-09: 650 mg via ORAL

## 2017-06-09 MED ORDER — SODIUM CHLORIDE 0.9% FLUSH
10.0000 mL | Freq: Once | INTRAVENOUS | Status: AC
  Administered 2017-06-09: 10 mL
  Filled 2017-06-09: qty 10

## 2017-06-09 MED ORDER — SODIUM CHLORIDE 0.9 % IV SOLN
Freq: Once | INTRAVENOUS | Status: AC
  Administered 2017-06-09: 10:00:00 via INTRAVENOUS

## 2017-06-09 MED ORDER — DIPHENHYDRAMINE HCL 25 MG PO CAPS
ORAL_CAPSULE | ORAL | Status: AC
  Filled 2017-06-09: qty 2

## 2017-06-09 MED ORDER — PALONOSETRON HCL INJECTION 0.25 MG/5ML
0.2500 mg | Freq: Once | INTRAVENOUS | Status: AC
  Administered 2017-06-09: 0.25 mg via INTRAVENOUS

## 2017-06-09 MED ORDER — DEXAMETHASONE SODIUM PHOSPHATE 10 MG/ML IJ SOLN
INTRAMUSCULAR | Status: AC
  Filled 2017-06-09: qty 1

## 2017-06-09 MED ORDER — HEPARIN SOD (PORK) LOCK FLUSH 100 UNIT/ML IV SOLN
500.0000 [IU] | Freq: Once | INTRAVENOUS | Status: AC | PRN
  Administered 2017-06-09: 500 [IU]
  Filled 2017-06-09: qty 5

## 2017-06-09 MED ORDER — SODIUM CHLORIDE 0.9 % IV SOLN
620.0000 mg | Freq: Once | INTRAVENOUS | Status: AC
  Administered 2017-06-09: 620 mg via INTRAVENOUS
  Filled 2017-06-09: qty 62

## 2017-06-09 MED ORDER — ACETAMINOPHEN 325 MG PO TABS
ORAL_TABLET | ORAL | Status: AC
  Filled 2017-06-09: qty 2

## 2017-06-09 MED ORDER — SODIUM CHLORIDE 0.9 % IV SOLN
6.0000 mg/kg | Freq: Once | INTRAVENOUS | Status: AC
  Administered 2017-06-09: 399 mg via INTRAVENOUS
  Filled 2017-06-09: qty 19

## 2017-06-09 MED ORDER — DIPHENHYDRAMINE HCL 25 MG PO CAPS
50.0000 mg | ORAL_CAPSULE | Freq: Once | ORAL | Status: AC
  Administered 2017-06-09: 50 mg via ORAL

## 2017-06-09 MED ORDER — DEXAMETHASONE SODIUM PHOSPHATE 10 MG/ML IJ SOLN
10.0000 mg | Freq: Once | INTRAMUSCULAR | Status: AC
  Administered 2017-06-09: 10 mg via INTRAVENOUS

## 2017-06-09 MED ORDER — PALONOSETRON HCL INJECTION 0.25 MG/5ML
INTRAVENOUS | Status: AC
  Filled 2017-06-09: qty 5

## 2017-06-09 MED ORDER — SODIUM CHLORIDE 0.9% FLUSH
10.0000 mL | INTRAVENOUS | Status: DC | PRN
  Administered 2017-06-09: 10 mL
  Filled 2017-06-09: qty 10

## 2017-06-09 NOTE — Progress Notes (Signed)
Okay to treat with 02-01-18 echo per Wilber Bihari, NP. Okay to treat with AUC 1.4 per Dr. Lindi Adie.

## 2017-06-09 NOTE — Patient Instructions (Signed)
[facility] Discharge Instructions for Patients Receiving Chemotherapy  Today you received the following chemotherapy agents:  Herceptin and Carboplatin.  To help prevent nausea and vomiting after your treatment, we encourage you to take your nausea medication as prescribed.   If you develop nausea and vomiting that is not controlled by your nausea medication, call the clinic.   BELOW ARE SYMPTOMS THAT SHOULD BE REPORTED IMMEDIATELY:  *FEVER GREATER THAN 100.5 F  *CHILLS WITH OR WITHOUT FEVER  NAUSEA AND VOMITING THAT IS NOT CONTROLLED WITH YOUR NAUSEA MEDICATION  *UNUSUAL SHORTNESS OF BREATH  *UNUSUAL BRUISING OR BLEEDING  TENDERNESS IN MOUTH AND THROAT WITH OR WITHOUT PRESENCE OF ULCERS  *URINARY PROBLEMS  *BOWEL PROBLEMS  UNUSUAL RASH Items with * indicate a potential emergency and should be followed up as soon as possible.  Feel free to call the clinic should you have any questions or concerns. The clinic phone number is [phone].  Please show the [facility] at check-in to the Emergency Department and triage nurse.

## 2017-06-09 NOTE — Progress Notes (Signed)
[facility] Follow up:    Burnis Medin, MD [place] [id number]   DIAGNOSIS: Cancer Staging Malignant neoplasm of overlapping 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 breast [facility]) Staging form: Breast, AJCC 8th Edition - Clinical: cT2, cN0, cM0, G1, ER: Unknown, PR: Unknown, HER2: Unknown - Unsigned - Pathologic: Stage IA (pT2, pN0, cM0, G1, ER: Positive, PR: Positive, HER2: Positive) - Signed by Gardenia Phlegm, NP on 01/27/2017  Malignant neoplasm of overlapping sites of right breast in female, estrogen receptor positive ([place]) Staging form: Breast, AJCC 8th Edition - Clinical stage from 12/23/2016: Stage IIIB (cT4, cN1, cM0, G1, ER: Positive, PR: Positive, HER2: Negative) - Unsigned - Pathologic: Stage IB (pT3, pN1a, cM0, G1, ER: Positive, PR: Positive, HER2: Negative) - Signed by Gardenia Phlegm, NP on 01/27/2017   SUMMARY OF ONCOLOGIC HISTORY:   Malignant neoplasm of overlapping 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 breast ([place])   01/12/2017 Surgery    Left mastectomy: IDC with DCIS, 2 tumors 3.6 cm ER 90%, PR 95%, Ki-67 5%, HER-2 negative ratio 1.32) and 1.2 cm (ER 95% PR 95% positive Her 2 Positive ratio 3.2, Ki-67 5%) T2 N0 stage Ib      02/16/2017 -  Chemotherapy    TCH x6 followed by Herceptin maintenance        Malignant neoplasm of overlapping sites of right breast in female, estrogen receptor positive ([place])   12/21/2016 Initial Diagnosis    Palpable lumps in the right breast with nipple inversion: Mammogram revealed skin thickening and distortion, ultrasound revealed 4 masses 3.1 cm at 11:00, 2.5 cm at 6:00, 4 cm at 8:00, 2.2 cm at 5:00 with 2 abnormal axillary lymph nodes: MRI revealed 7 cm abnormality on right breast, in addition 3.6 cm abnormality in the left breast and 2 additional masses 1 cm each; right breast: T4 N1 stage III B clinical stage; left breast: T2 N0 stage IB clinical stage      12/21/2016 Pathology Results    Right breast:  Grade 1 Invasive lobular cancer, lymph node also positive, ER 70%, PR 90%, Ki-67 10%, HER-2 negative ratio 1.09 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  The Multi-Cancer Panel was ordered. The Multi-Cancer Panel offered by [facility] includes sequencing and/or deletion duplication testing of the following 83 genes: ALK, APC, ATM, AXIN2,BAP1,  BARD1, BLM, BMPR1A, BRCA1, BRCA2, BRIP1, CASR, CDC73, CDH1, CDK4, CDKN1B, CDKN1C, CDKN2A (p14ARF), CDKN2A (p16INK4a), CEBPA, CHEK2, CTNNA1, DICER1, DIS3L2, EGFR (c.2369C>T, p.Thr790Met variant only), EPCAM (Deletion/duplication testing only), FH, FLCN, GATA2, GPC3, GREM1 (Promoter region deletion/duplication testing only), HOXB13 (c.251G>A, p.Gly84Glu), HRAS, KIT, MAX, MEN1, MET, MITF (c.952G>A, p.Glu318Lys variant only), MLH1, MSH2, MSH3, MSH6, MUTYH, NBN, NF1, NF2, NTHL1, PALB2, PDGFRA, PHOX2B, PMS2, POLD1, POLE, POT1, PRKAR1A, PTCH1, PTEN, RAD50, RAD51C, RAD51D, RB1, RECQL4, RET, RUNX1, SDHAF2, SDHA (sequence changes only), SDHB, SDHC, SDHD, SMAD4, SMARCA4, SMARCB1, SMARCE1, STK11, SUFU, TERC, TERT, TMEM127, TP53, TSC1, TSC2, VHL, WRN and WT1.   Results: Negative, no pathogenic variants identified.  The date of this test report is 01/04/2017.        01/12/2017 Surgery    Right mastectomy: Invasive and in situ lobular carcinoma involves the dermis of the skin of the nipple, margins negative, 3/3 lymph nodes positive, grade 1, EF 75%, PR 95%, HER-2 negative  ratio 1.19, Ki-67 10%, T3N1A stage IIa       CURRENT THERAPY: Carbo/Herceptin cycle 6  INTERVAL HISTORY: Amanda Williams 52 y.o. female returns for evaluation prior to receiving her final treatment of chemotherapy.  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  She did have a root  canal yesterday.  She is healing well from the procedure. She has taken ibuprofen for the soreness.  She is due for an echo.   Patient Active Problem List   Diagnosis Date Noted  . Bilateral breast cancer ([place]) 01/12/2017  . Genetic testing 01/05/2017  . Family history of breast cancer   . Malignant neoplasm of overlapping sites of right breast in female, estrogen receptor positive ([place]) 12/23/2016  . Malignant neoplasm of overlapping 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 breast ([place]) 12/18/2016  . Exposure to communicable disease 08/04/2012  . Counseling about travel 08/04/2012  . Throat symptom 04/12/2012  . Visit for preventive health examination 10/18/2010  . Dense breasts 10/18/2010  . Pelvic pressure in female 05/20/2010  . PANIC ATTACK 03/27/2008  . MICROSCOPIC HEMATURIA 03/27/2008  . PALPITATIONS 02/02/2007  . ALLERGIC RHINITIS 10/07/2006    is allergic to penicillins; sulfonamide derivatives; and adhesive [tape].  MEDICAL HISTORY: Past Medical History:  Diagnosis Date  . Allergy   . Breast cancer ([place])   . Difficult intubation    see 01/12/17 anesthesia record  . Family history of breast cancer   . Hematuria    ? trigonitis  . Panic attack    echo 2009 normal    SURGICAL HISTORY: Past Surgical History:  Procedure Laterality Date  . BREAST BIOPSY     left  . BREAST RECONSTRUCTION WITH PLACEMENT OF TISSUE EXPANDER AND FLEX HD (ACELLULAR HYDRATED DERMIS) Bilateral 01/12/2017   Procedure: BREAST RECONSTRUCTION WITH PLACEMENT OF TISSUE EXPANDER AND ALLODERM;  Surgeon: Irene Limbo, MD;  Location: [facility];  Service: Plastics;  Laterality: Bilateral;  . MASTECTOMY W/ SENTINEL NODE BIOPSY Bilateral 01/12/2017   Procedure: RIGHT MODIFIED RADICAL MASTECTOMY, LEFT TOTAL MASTECTOMY WITH LEFT SENTINEL LYMPH NODE BIOPSY;  Surgeon: Rolm Bookbinder, MD;  Location: [facility];  Service: General;  Laterality: Bilateral;  . PORTACATH PLACEMENT N/A  02/15/2017   Procedure: INSERTION PORT-A-CATH WITH [place];  Surgeon: Rolm Bookbinder, MD;  Location: [facility];  Service: General;  Laterality: N/A;    SOCIAL HISTORY: Social History   Socioeconomic History  . Marital status: Married    Spouse name: Not on file  . Number of children: Not on file  . Years of education: Not on file  . Highest education level: Not on file  Social Needs  . Financial resource strain: Not on file  . Food insecurity - worry: Not on file  . Food insecurity - inability: Not on file  . Transportation needs - medical: Not on file  . Transportation needs - non-medical: Not on file  Occupational History  . Not on file  Tobacco Use  . Smoking status: Never Smoker  . Smokeless tobacco: Never Used  Substance and Sexual Activity  . Alcohol use: Yes    Alcohol/week: 2.4 oz    Types: 4 Glasses of wine per week    Comment: ocassionally  . Drug use: No  . Sexual activity: Not on file    Comment: Husband has had vasectomy  Other Topics Concern  . Not on file  Social History Narrative   hhof  4    Pets  dog and cat.   Works [employer]- [employer] .     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    Has smoke detector and wears seat belts.  No firearms. No excess sun exposure. Sees dentist regularly .           FAMILY HISTORY: Family History  Problem Relation Age of Onset  . Breast cancer Maternal Grandmother 87    Review of Systems  Constitutional: Negative for appetite change, chills, fatigue and fever.  HENT:   Negative for hearing loss and lump/mass.   Eyes: Negative for eye problems and icterus.  Respiratory: Negative for chest tightness, cough and shortness of breath.   Cardiovascular: Negative for chest pain, leg swelling and palpitations.  Gastrointestinal: Negative for abdominal distention, abdominal pain, constipation, diarrhea, nausea and vomiting.  Skin: Negative for itching and rash.  Neurological: Positive for numbness (see interval history).  Negative for dizziness, extremity weakness and headaches.  Hematological: Negative for adenopathy. Does not bruise/bleed easily.  Psychiatric/Behavioral: Negative for depression. The patient is not nervous/anxious.       PHYSICAL EXAMINATION  ECOG PERFORMANCE STATUS: 1 - Symptomatic but completely ambulatory  Vitals:   06/09/17 0917  BP: 116/66  Pulse: 79  Resp: 18  Temp: 98.3 F (36.8 C)  SpO2: 100%    Physical Exam  Constitutional: She is oriented to person, place, and time and well-developed, well-nourished, and in no distress.  HENT:  Head: Normocephalic and atraumatic.  Mouth/Throat: Oropharynx is clear and moist. No oropharyngeal exudate.  Eyes: Pupils are equal, round, and reactive to light. No scleral icterus.  Neck: Neck supple.  Cardiovascular: Normal rate, regular rhythm and normal heart sounds.  Pulmonary/Chest: Effort normal and breath sounds normal. No respiratory distress. She has no wheezes. She has no rales.  Abdominal: Soft. Bowel sounds are normal. She exhibits no distension and no mass. There is no tenderness. There is no rebound and no guarding.  Musculoskeletal: She exhibits no edema.  Lymphadenopathy:    She has no cervical adenopathy.  Neurological: She is alert and oriented to person, place, and time.  Skin: Skin is warm and dry. No rash noted.  Psychiatric: Mood and affect normal.    LABORATORY DATA:  CBC    Component Value Date/Time   WBC 1.7 (L) 06/09/2017 0853   WBC 4.3 03/17/2017 1008   WBC 8.6 02/15/2017 0631   RBC 3.30 (L) 06/09/2017 0853   HGB 10.0 (L) 03/17/2017 1008   HCT 30.9 (L) 06/09/2017 0853   HCT 31.2 (L) 03/17/2017 1008   PLT 169 06/09/2017 0853   PLT 245 03/17/2017 1008   MCV 93.8 06/09/2017 0853   MCV 89.7 03/17/2017 1008   MCH 31.2 06/09/2017 0853   MCHC 33.3 06/09/2017 0853   RDW 19.4 (H) 06/09/2017 0853   RDW 13.7 03/17/2017 1008   LYMPHSABS 0.3 (L) 06/09/2017 0853   LYMPHSABS 0.7 (L) 03/17/2017 1008   MONOABS  0.0 (L) 06/09/2017 0853   MONOABS 0.1 03/17/2017 1008   EOSABS 0.0 06/09/2017 0853   EOSABS 0.0 03/17/2017 1008   BASOSABS 0.0 06/09/2017 0853   BASOSABS 0.0 03/17/2017 1008    CMP     Component Value Date/Time   NA 138 06/09/2017 0853   NA 140 03/17/2017 1008   K 4.1 06/09/2017 0853   K 4.0 03/17/2017 1008   CL 104 06/09/2017 0853   CO2 24 06/09/2017 0853   CO2  22 03/17/2017 1008   GLUCOSE 212 (H) 06/09/2017 0853   GLUCOSE 172 (H) 03/17/2017 1008   BUN 13 06/09/2017 0853   BUN 13.0 03/17/2017 1008   CREATININE 0.87 06/09/2017 0853   CREATININE 0.9 03/17/2017 1008   CALCIUM 9.0 06/09/2017 0853   CALCIUM 8.5 03/17/2017 1008   PROT 6.8 06/09/2017 0853   PROT 6.9 03/17/2017 1008   ALBUMIN 3.6 06/09/2017 0853   ALBUMIN 3.6 03/17/2017 1008   AST 24 06/09/2017 0853   AST 23 03/17/2017 1008   ALT 31 06/09/2017 0853   ALT 20 03/17/2017 1008   ALKPHOS 84 06/09/2017 0853   ALKPHOS 70 03/17/2017 1008   BILITOT 0.4 06/09/2017 0853   BILITOT 0.34 03/17/2017 1008   GFRNONAA >60 06/09/2017 0853   GFRAA >60 06/09/2017 0853       ASSESSMENT and PLAN:   Malignant neoplasm of overlapping sites of right breast in female, estrogen receptor positive ([place]) 01/12/2017:Left mastectomy: IDC with DCIS, 2 tumors 3.6 cm ER 90%, PR 95%, Ki-67 5%, HER-2 negative ratio 1.32) and 1.2 cm (ER 95% PR 95% positive Her 2 Positive ratio 3.2, Ki-67 5%) T2 N0 stage Ib Right mastectomy: Invasive and in situ lobular carcinoma involves the dermis of the skin of the nipple, margins negative, 3/3 lymph nodes positive, grade 1, EF 75%, PR 95%, HER-2 negative ratio 1.19, Ki-67 10%, T3N1A stage IIa  Treatment plan: 1. Adjuvant chemotherapy with TCH followed by Herceptin maintenance for 1 year 2. Adjuvant radiation therapy 3. Followed by adjuvant antiestrogen therapy __________________________________________________________________________________________ Echo on 02/01/17: 55% - 60% Current  treatment: Cycle 6day 1TCH (Without Taxotere)  Birdia is doing well today and will proceed with treatment.  She had her last echo in mid November prior to starting her treatment.  I reviewed this with Dr. Lindi Adie, and the fact that she is asymptomatic from a cardiac standpoint.  She was cleared by Dr. Lindi Adie to proceed with treatment today.  I have ordered an echo to be done prior to her next treatment.  She is also mildly neutropenic and had a root canal yesterday.  Her ANC is 1.4.  She will proceed with chemotherapy.  I reviewed this with Dr. Lindi Adie, and she will not receive Neupogen or Neulasta.  We discussed possibility of antibiotics, however patient has PCN allergy, and we would not want to put her on Clindamycin due to risks of side effects outweighing potential benefits.  Patient understands all of this and is agreement with the above plan.  She will meet with Dr. Lisbeth Renshaw in early April, and will f/u with [place] in 3 weeks for labs, f/u and Herceptin.        Orders Placed This Encounter  Procedures  . CBC with Differential (Cancer Center Only)    Standing Status:   Standing    Number of Occurrences:   52    Standing Expiration Date:   06/10/2019  . CMP ([facility] only)    Standing Status:   Standing    Number of Occurrences:   52    Standing Expiration Date:   06/10/2019  . ECHOCARDIOGRAM COMPLETE    Standing Status:   Future    Standing Expiration Date:   09/10/2018    Order Specific Question:   Where should this test be performed    Answer:   [facility]    Order Specific Question:   Perflutren DEFINITY (image enhancing agent) should be administered unless hypersensitivity or allergy exist    Answer:  Administer Perflutren    Order Specific Question:   Expected Date:    Answer:   ASAP    All questions were answered. The patient knows to call the clinic with any problems, questions or concerns. We can certainly see the patient much sooner if necessary.  A total of (30) minutes of  face-to-face time was spent with this patient with greater than 50% of that time in counseling and care-coordination.  This note was electronically signed. Scot Dock, NP 06/09/2017

## 2017-06-09 NOTE — Progress Notes (Signed)
Ok to treat with todays labs and previous ECHO per Wilber Bihari, NP. Infusion RN aware.  Cyndia Bent RN

## 2017-06-09 NOTE — Assessment & Plan Note (Signed)
01/12/2017:Left mastectomy: IDC with DCIS, 2 tumors 3.6 cm ER 90%, PR 95%, Ki-67 5%, HER-2 negative ratio 1.32) and 1.2 cm (ER 95% PR 95% positive Her 2 Positive ratio 3.2, Ki-67 5%) T2 N0 stage Ib Right mastectomy: Invasive and in situ lobular carcinoma involves the dermis of the skin of the nipple, margins negative, 3/3 lymph nodes positive, grade 1, EF 75%, PR 95%, HER-2 negative ratio 1.19, Ki-67 10%, T3N1A stage IIa  Treatment plan: 1. Adjuvant chemotherapy with TCH followed by Herceptin maintenance for 1 year 2. Adjuvant radiation therapy 3. Followed by adjuvant antiestrogen therapy __________________________________________________________________________________________ Echo on 02/01/17: 55% - 60% Current treatment: Cycle 6day 1TCH (Without Taxotere)  Amanda Williams is doing well today and will proceed with treatment.  She had her last echo in mid November prior to starting her treatment.  I reviewed this with Dr. Lindi Adie, and the fact that she is asymptomatic from a cardiac standpoint.  She was cleared by Dr. Lindi Adie to proceed with treatment today.  I have ordered an echo to be done prior to her next treatment.  She is also mildly neutropenic and had a root canal yesterday.  Her ANC is 1.4.  She will proceed with chemotherapy.  I reviewed this with Dr. Lindi Adie, and she will not receive Neupogen or Neulasta.  We discussed possibility of antibiotics, however patient has PCN allergy, and we would not want to put her on Clindamycin due to risks of side effects outweighing potential benefits.  Patient understands all of this and is agreement with the above plan.  She will meet with Dr. Lisbeth Renshaw in early April, and will f/u with [place] in 3 weeks for labs, f/u and Herceptin.

## 2017-06-09 NOTE — Progress Notes (Signed)
Clarify:  Per RN Hassan Rowan ok to treat with ANC 1.4 not AUC  Henreitta Leber, PharmD

## 2017-06-10 ENCOUNTER — Telehealth: Payer: Self-pay | Admitting: Adult Health

## 2017-06-10 ENCOUNTER — Other Ambulatory Visit: Payer: Self-pay | Admitting: Hematology and Oncology

## 2017-06-10 NOTE — Telephone Encounter (Signed)
Per 3/20 no los

## 2017-06-16 ENCOUNTER — Ambulatory Visit (HOSPITAL_COMMUNITY)
Admission: RE | Admit: 2017-06-16 | Discharge: 2017-06-16 | Disposition: A | Discharge status: Home / Self Care | Payer: 59 | Source: Ambulatory Visit | Attending: Adult Health | Admitting: Adult Health

## 2017-06-16 DIAGNOSIS — C50812 Malignant neoplasm of overlapping 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 breast: Secondary | ICD-10-CM | POA: Diagnosis not present

## 2017-06-16 DIAGNOSIS — Z170 Estrogen receptor positive status [ER+]: Secondary | ICD-10-CM | POA: Insufficient documentation

## 2017-06-16 DIAGNOSIS — R002 Palpitations: Secondary | ICD-10-CM | POA: Diagnosis not present

## 2017-06-16 DIAGNOSIS — C50811 Malignant neoplasm of overlapping sites of right female breast: Secondary | ICD-10-CM

## 2017-06-16 NOTE — Progress Notes (Signed)
  Echocardiogram 2D Echocardiogram has been performed.  Amanda Williams 06/16/2017, 12:09 PM

## 2017-06-22 ENCOUNTER — Encounter: Payer: Self-pay | Admitting: Hematology and Oncology

## 2017-06-24 ENCOUNTER — Encounter: Payer: Self-pay | Admitting: Radiation Oncology

## 2017-06-24 ENCOUNTER — Other Ambulatory Visit: Payer: Self-pay

## 2017-06-24 ENCOUNTER — Ambulatory Visit
Admission: RE | Admit: 2017-06-24 | Discharge: 2017-06-24 | Disposition: A | Discharge status: Home / Self Care | Payer: 59 | Source: Ambulatory Visit | Attending: Radiation Oncology | Admitting: Radiation Oncology

## 2017-06-24 VITALS — BP 123/65 | HR 87 | Temp 98.4°F | Resp 16 | Ht 66.0 in | Wt 153.0 lb

## 2017-06-24 DIAGNOSIS — Z9221 Personal history of antineoplastic chemotherapy: Secondary | ICD-10-CM | POA: Insufficient documentation

## 2017-06-24 DIAGNOSIS — C50811 Malignant neoplasm of overlapping sites of right female breast: Secondary | ICD-10-CM

## 2017-06-24 DIAGNOSIS — Z791 Long term (current) use of non-steroidal anti-inflammatories (NSAID): Secondary | ICD-10-CM | POA: Insufficient documentation

## 2017-06-24 DIAGNOSIS — Z170 Estrogen receptor positive status [ER+]: Secondary | ICD-10-CM | POA: Diagnosis not present

## 2017-06-24 DIAGNOSIS — Z882 Allergy status to sulfonamides status: Secondary | ICD-10-CM | POA: Insufficient documentation

## 2017-06-24 DIAGNOSIS — Z9889 Other specified postprocedural states: Secondary | ICD-10-CM | POA: Diagnosis not present

## 2017-06-24 DIAGNOSIS — Z79899 Other long term (current) drug therapy: Secondary | ICD-10-CM | POA: Insufficient documentation

## 2017-06-24 DIAGNOSIS — Z888 Allergy status to other drugs, medicaments and biological substances status: Secondary | ICD-10-CM | POA: Diagnosis not present

## 2017-06-24 DIAGNOSIS — Z803 Family history of malignant neoplasm of breast: Secondary | ICD-10-CM | POA: Insufficient documentation

## 2017-06-24 DIAGNOSIS — Z880 Allergy status to penicillin: Secondary | ICD-10-CM | POA: Diagnosis not present

## 2017-06-24 DIAGNOSIS — Z9013 Acquired absence of bilateral breasts and nipples: Secondary | ICD-10-CM | POA: Insufficient documentation

## 2017-06-24 NOTE — Progress Notes (Signed)
Radiation Oncology         [phone] ________________________________  Name: Amanda Williams        MRN: [id number]  Date of Service: 06/24/2017 DOB: 02-Sep-1965  ER:[id number], Standley Brooking, MD  Nicholas Lose, MD     REFERRING PHYSICIAN: Nicholas Lose, MD   DIAGNOSIS: The encounter diagnosis was Malignant neoplasm of overlapping sites of right breast in female, estrogen receptor positive ([place]).   HISTORY OF PRESENT ILLNESS: Amanda Williams is a 52 y.o. female originally seen in the multidisciplinary breast clinic for a new diagnosis of bilateral breast cancer. The patient was noted to have a palpable mass with nipple inversion of the right breast for several weeks. She proceeded with mammography on 12/10/16 which revealed dense tissue and concerns for distortion in the posterior aspect of the breast with displacement and thickening of the skin. She had an ultrasound of the right breast as well that revealed a 3.1 cm mass at 11:00, 2.5 cm mass at 6:00, 4 cm mass at 8:00, and a 2.2 cm mass at 5:00. In the axilla 2 of the lymph nodes had cortical thickening. She proceeded with biopsy of the right breast in three sites on 12/14/16, the 11:00, 5:30, and 8:00 positions were listed as biopsy sites and revealed a grade 1, ER/PR positive, HER2 negative invasive lobular carcinoma in each. The axillary node was also notable for carcinoma. She had an MRI on 12/17/16 and this revealed enhancement of the skin, with patchy nodular enhancement of bilateral breasts. Within the right breast, there was diffuse enhancement in all 4 quadrants with about 7 cm of enhancement in total per discussion by radiology in conference. On the left there were three spiculated masses, all measuring about 1 cm and throughout the 4 quadrants. She underwent left breast ultrasound that revealed 3.6 cm at 9:00 and 1 cm at 12:00, and her axilla was negative. A biopsy of the two sites seen on ultrasound on 12/21/16 revealed grade 1 invasive ductal carcinoma,  triple positive. On 01/12/17, she proceeded with bilateral mastectomies, left sentinel lymph node biopsy, and tissue expander placement bilaterally. Final pathology revealed invasive and in situ lobular carcinoma greater than 10 cm, grade 1 in the right breast involving the dermis of the skin and nipple, margins were negative, and metastatic disease was noted in 3 lymph nodes with extranodal extension.  The left mastectomy specimen revealed 2 sites of cancer measuring 3.6 and 1.2 cm invasive ductal carcinoma, grade 1 with negative margins and none of the 5 lymph nodes were removed revealed malignancy.  She went on to receive systemic therapy with chemo between 02/16/17 and completed treatment on 06/09/2017.  She will continue maintenance Herceptin for 1 year, and comes today to discuss the role of adjuvant radiotherapy. Of note she is scheduled to undergo exchange of her expanders for implants on 07/16/17 with Dr. Iran Planas.    PREVIOUS RADIATION THERAPY: No   PAST MEDICAL HISTORY:  Past Medical History:  Diagnosis Date  . Allergy   . Breast cancer ([place])   . Difficult intubation    see 01/12/17 anesthesia record  . Family history of breast cancer   . Hematuria    ? trigonitis  . Panic attack    echo 2009 normal       PAST SURGICAL HISTORY: Past Surgical History:  Procedure Laterality Date  . BREAST BIOPSY     left  . BREAST RECONSTRUCTION WITH PLACEMENT OF TISSUE EXPANDER AND FLEX HD (ACELLULAR HYDRATED DERMIS) Bilateral  01/12/2017   Procedure: BREAST RECONSTRUCTION WITH PLACEMENT OF TISSUE EXPANDER AND ALLODERM;  Surgeon: Irene Limbo, MD;  Location: [place];  Service: Plastics;  Laterality: Bilateral;  . MASTECTOMY W/ SENTINEL NODE BIOPSY Bilateral 01/12/2017   Procedure: RIGHT MODIFIED RADICAL MASTECTOMY, LEFT TOTAL MASTECTOMY WITH LEFT SENTINEL LYMPH NODE BIOPSY;  Surgeon: Rolm Bookbinder, MD;  Location: [facility];  Service: General;   Laterality: Bilateral;  . PORTACATH PLACEMENT N/A 02/15/2017   Procedure: INSERTION PORT-A-CATH WITH [place];  Surgeon: Rolm Bookbinder, MD;  Location: [facility];  Service: General;  Laterality: N/A;     FAMILY HISTORY: Family History  Problem Relation Age of Onset  . Breast cancer Maternal Grandmother 87    SOCIAL HISTORY:  reports that she has never smoked. She has never used smokeless tobacco. She reports that she drinks about 2.4 oz of alcohol per week. She reports that she does not use drugs. The patient is a Engineer, maintenance (IT). She is married and lives in [place].    ALLERGIES: Penicillins; Sulfonamide derivatives; and Adhesive [tape]   MEDICATIONS:  Current Outpatient Medications  Medication Sig Dispense Refill  . acetaminophen (TYLENOL) 500 MG tablet Take 1,000 mg 2 (two) times daily as needed by mouth for moderate pain.    Marland Kitchen dexamethasone (DECADRON) 4 MG tablet Take 1 tablet (4 mg total) by mouth daily. Take 1 tablet a day before chemo and 1 tablet a day after 12 tablet 0  . ibuprofen (ADVIL,MOTRIN) 200 MG tablet Take 400 mg 2 (two) times daily as needed by mouth for moderate pain.    Marland Kitchen lidocaine-prilocaine (EMLA) cream Apply to affected area once 30 g 3  . LORazepam (ATIVAN) 0.5 MG tablet TAKE 1 TABLET BY MOUTH EVERY 6 HOURS AS NEEDED FOR NAUSEA (Patient not taking: Reported on 05/10/2017) 30 tablet 1  . ondansetron (ZOFRAN) 8 MG tablet Take 1 tablet (8 mg total) by mouth 2 (two) times daily as needed for refractory nausea / vomiting. Start on day 3 after chemo. 30 tablet 1  . prochlorperazine (COMPAZINE) 10 MG tablet Take 1 tablet (10 mg total) by mouth every 6 (six) hours as needed (Nausea or vomiting). 90 tablet 2  . sertraline (ZOLOFT) 100 MG tablet Take 100 mg every evening by mouth.     . zolpidem (AMBIEN) 5 MG tablet TAKE 1 TABLET (5 MG TOTAL) BY MOUTH AT BEDTIME AS NEEDED FOR SLEEP. 30 tablet 2   No current facility-administered medications for this encounter.      REVIEW OF  SYSTEMS: On review of systems, the patient reports that she is doing well overall. 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 She denies any chest pain, shortness of breath, cough, fevers, chills, night sweats, unintended weight changes. She denies any bowel or bladder disturbances, and denies abdominal pain, nausea or vomiting. She denies any new musculoskeletal or joint aches or pains, new skin lesions or concerns. A complete review of systems is obtained and is otherwise negative.    PHYSICAL EXAM:  Wt Readings from Last 3 Encounters:  06/09/17 152 lb 14.4 oz (69.4 kg)  05/19/17 153 lb 4.8 oz (69.5 kg)  05/10/17 153 lb (69.4 kg)   Temp Readings from Last 3 Encounters:  06/09/17 98.3 F (36.8 C) (Oral)  05/19/17 98.9 F (37.2 C) (Oral)  05/10/17 98.4 F (36.9 C) (Oral)   BP Readings from Last 3 Encounters:  06/09/17 116/66  05/19/17 110/74  05/10/17 114/72  Pulse Readings from Last 3 Encounters:  06/09/17 79  05/19/17 (!) 103  05/10/17 96     In general this is a well appearing Caucasian female in no acute distress. She is alert and oriented x4 and appropriate throughout the examination. HEENT reveals that the patient is normocephalic, atraumatic. EOMs are intact. PERRLA. Skin is intact without any evidence of gross lesions. Cardiopulmonary assessment is negative for acute distress and she exhibits normal effort.  Bilateral breast examination reveals intact tissue expanders bilaterally with well-healed mastectomy scar lines. Her port a cath is intact and in situ in the right chest.    ECOG = 1  0 - Asymptomatic (Fully active, able to carry on all predisease activities without restriction)  1 - Symptomatic but completely ambulatory (Restricted in physically strenuous activity but ambulatory and able to carry out work of a light or sedentary nature. For example, light housework, office work)  2 - Symptomatic, <50% in bed  during the day (Ambulatory and capable of all self care but unable to carry out any work activities. Up and about more than 50% of waking hours)  3 - Symptomatic, >50% in bed, but not bedbound (Capable of only limited self-care, confined to bed or chair 50% or more of waking hours)  4 - Bedbound (Completely disabled. Cannot carry on any self-care. Totally confined to bed or chair)  5 - Death   Eustace Pen MM, Creech RH, Tormey DC, et al. [phone]). "Toxicity and response criteria of the [facility]". [place] Oncol. 5 (6): 649-55    LABORATORY DATA:  Lab Results  Component Value Date   WBC 1.7 (L) 06/09/2017   HGB 10.0 (L) 03/17/2017   HCT 30.9 (L) 06/09/2017   MCV 93.8 06/09/2017   PLT 169 06/09/2017   Lab Results  Component Value Date   NA 138 06/09/2017   K 4.1 06/09/2017   CL 104 06/09/2017   CO2 24 06/09/2017   Lab Results  Component Value Date   ALT 31 06/09/2017   AST 24 06/09/2017   ALKPHOS 84 06/09/2017   BILITOT 0.4 06/09/2017      RADIOGRAPHY: No results found.     IMPRESSION/PLAN: 1. 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 Dr. Lisbeth Renshaw rereviews the patient's course and reviews the nature of bilateral breast disease and the triple positive component with the left breast. She has completed healing and systemic chemotherapy is now at a point where we can consider adjuvant radiotherapy. Dr. Lisbeth Renshaw spent time discussing radiation therapy, and discussed the risks, benefits, short, and long term effects of radiotherapy, and the patient is interested in proceeding. Dr. Lisbeth Renshaw discusses the delivery and logistics of radiotherapy and recommends a course of 6 1/2 weeks to the right breast and right regional nodes. She will continue Herceptin with Dr. Lindi Adie as previously outlined as well as adjuvant anti  estrogen therapy following radiotherapy.   In a visit lasting 25 minutes, greater than 50% of the time was spent face to face discussing her case, and coordinating the patient's care.   The above documentation reflects my direct findings during this shared patient visit. Please see the separate note by Dr. Lisbeth Renshaw on this date for the remainder of the patient's plan of care.    Carola Rhine, PAC

## 2017-06-25 ENCOUNTER — Ambulatory Visit: Payer: 59

## 2017-06-25 ENCOUNTER — Ambulatory Visit: Payer: 59 | Admitting: Urology

## 2017-06-29 NOTE — Assessment & Plan Note (Signed)
01/12/2017:Left mastectomy: IDC with DCIS, 2 tumors 3.6 cm ER 90%, PR 95%, Ki-67 5%, HER-2 negative ratio 1.32) and 1.2 cm (ER 95% PR 95% positive Her 2 Positive ratio 3.2, Ki-67 5%) T2 N0 stage Ib Right mastectomy: Invasive and in situ lobular carcinoma involves the dermis of the skin of the nipple, margins negative, 3/3 lymph nodes positive, grade 1, EF 75%, PR 95%, HER-2 negative ratio 1.19, Ki-67 10%, T3N1A stage IIa  Treatment plan: 1. Adjuvant chemotherapy with TCH followed by Herceptin maintenance for 1 year 2. Adjuvant radiation therapy 3. Followed by adjuvant antiestrogen therapy __________________________________________________________________________________________ Echo on 02/01/17: 55% - 60% Current treatment: completed Cycle 6day 1TCH (Without Taxotere), now on Herceptin maintenance   She will start XRT soon    

## 2017-06-30 ENCOUNTER — Inpatient Hospital Stay (HOSPITAL_BASED_OUTPATIENT_CLINIC_OR_DEPARTMENT_OTHER): Payer: 59 | Admitting: Hematology and Oncology

## 2017-06-30 ENCOUNTER — Inpatient Hospital Stay: Payer: 59

## 2017-06-30 ENCOUNTER — Inpatient Hospital Stay: Payer: 59 | Attending: Hematology and Oncology

## 2017-06-30 DIAGNOSIS — Z170 Estrogen receptor positive status [ER+]: Secondary | ICD-10-CM | POA: Insufficient documentation

## 2017-06-30 DIAGNOSIS — C773 Secondary and unspecified malignant neoplasm of axilla and upper limb lymph nodes: Secondary | ICD-10-CM

## 2017-06-30 DIAGNOSIS — Z9012 Acquired absence of left breast and nipple: Secondary | ICD-10-CM | POA: Insufficient documentation

## 2017-06-30 DIAGNOSIS — Z5112 Encounter for antineoplastic immunotherapy: Secondary | ICD-10-CM | POA: Diagnosis not present

## 2017-06-30 DIAGNOSIS — C50012 Malignant neoplasm of nipple and areola, left female breast: Principal | ICD-10-CM

## 2017-06-30 DIAGNOSIS — C50011 Malignant neoplasm of nipple and areola, right female breast: Secondary | ICD-10-CM

## 2017-06-30 DIAGNOSIS — C50812 Malignant neoplasm of overlapping 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 breast: Secondary | ICD-10-CM

## 2017-06-30 DIAGNOSIS — Z9013 Acquired absence of bilateral breasts and nipples: Secondary | ICD-10-CM

## 2017-06-30 DIAGNOSIS — C50811 Malignant neoplasm of overlapping sites of right female breast: Secondary | ICD-10-CM | POA: Insufficient documentation

## 2017-06-30 DIAGNOSIS — I5030 Unspecified diastolic (congestive) heart failure: Secondary | ICD-10-CM | POA: Diagnosis not present

## 2017-06-30 LAB — CBC WITH DIFFERENTIAL (CANCER CENTER ONLY)
BASOS ABS: 0 10*3/uL (ref 0.0–0.1)
BASOS PCT: 0 %
EOS ABS: 0 10*3/uL (ref 0.0–0.5)
EOS PCT: 0 %
HCT: 31.3 % — ABNORMAL LOW (ref 34.8–46.6)
Hemoglobin: 10.5 g/dL — ABNORMAL LOW (ref 11.6–15.9)
Lymphocytes Relative: 38 %
Lymphs Abs: 1.2 10*3/uL (ref 0.9–3.3)
MCH: 32.3 pg (ref 25.1–34.0)
MCHC: 33.6 g/dL (ref 31.5–36.0)
MCV: 95.9 fL (ref 79.5–101.0)
MONO ABS: 0.3 10*3/uL (ref 0.1–0.9)
MONOS PCT: 8 %
NEUTROS ABS: 1.7 10*3/uL (ref 1.5–6.5)
Neutrophils Relative %: 54 %
PLATELETS: 133 10*3/uL — AB (ref 145–400)
RBC: 3.26 MIL/uL — ABNORMAL LOW (ref 3.70–5.45)
RDW: 17.9 % — AB (ref 11.2–14.5)
WBC Count: 3.2 10*3/uL — ABNORMAL LOW (ref 3.9–10.3)

## 2017-06-30 LAB — CMP (CANCER CENTER ONLY)
ALBUMIN: 3.6 g/dL (ref 3.5–5.0)
ALK PHOS: 84 U/L (ref 40–150)
ALT: 31 U/L (ref 0–55)
ANION GAP: 10 (ref 3–11)
AST: 29 U/L (ref 5–34)
BILIRUBIN TOTAL: 0.4 mg/dL (ref 0.2–1.2)
BUN: 16 mg/dL (ref 7–26)
CALCIUM: 9.2 mg/dL (ref 8.4–10.4)
CO2: 25 mmol/L (ref 22–29)
CREATININE: 0.87 mg/dL (ref 0.60–1.10)
Chloride: 106 mmol/L (ref 98–109)
GFR, Est AFR Am: >60 mL/min (ref >60)
GFR, Estimated: >60 mL/min (ref >60)
GLUCOSE: 129 mg/dL (ref 70–140)
Potassium: 3.7 mmol/L (ref 3.5–5.1)
Sodium: 141 mmol/L (ref 136–145)
TOTAL PROTEIN: 7.1 g/dL (ref 6.4–8.3)

## 2017-06-30 MED ORDER — SODIUM CHLORIDE 0.9 % IV SOLN
Freq: Once | INTRAVENOUS | Status: AC
  Administered 2017-06-30: 09:00:00 via INTRAVENOUS

## 2017-06-30 MED ORDER — HEPARIN SOD (PORK) LOCK FLUSH 100 UNIT/ML IV SOLN
500.0000 [IU] | Freq: Once | INTRAVENOUS | Status: AC | PRN
  Administered 2017-06-30: 500 [IU]
  Filled 2017-06-30: qty 5

## 2017-06-30 MED ORDER — TRASTUZUMAB CHEMO 150 MG IV SOLR
6.0000 mg/kg | Freq: Once | INTRAVENOUS | Status: AC
  Administered 2017-06-30: 399 mg via INTRAVENOUS
  Filled 2017-06-30: qty 19

## 2017-06-30 MED ORDER — DIPHENHYDRAMINE HCL 25 MG PO CAPS
50.0000 mg | ORAL_CAPSULE | Freq: Once | ORAL | Status: AC
  Administered 2017-06-30: 50 mg via ORAL

## 2017-06-30 MED ORDER — DIPHENHYDRAMINE HCL 25 MG PO CAPS
ORAL_CAPSULE | ORAL | Status: AC
  Filled 2017-06-30: qty 2

## 2017-06-30 MED ORDER — SODIUM CHLORIDE 0.9% FLUSH
10.0000 mL | INTRAVENOUS | Status: DC | PRN
  Administered 2017-06-30: 10 mL
  Filled 2017-06-30: qty 10

## 2017-06-30 MED ORDER — ACETAMINOPHEN 325 MG PO TABS
ORAL_TABLET | ORAL | Status: AC
  Filled 2017-06-30: qty 2

## 2017-06-30 MED ORDER — SODIUM CHLORIDE 0.9% FLUSH
10.0000 mL | Freq: Once | INTRAVENOUS | Status: AC
  Administered 2017-06-30: 10 mL
  Filled 2017-06-30: qty 10

## 2017-06-30 MED ORDER — ACETAMINOPHEN 325 MG PO TABS
650.0000 mg | ORAL_TABLET | Freq: Once | ORAL | Status: AC
  Administered 2017-06-30: 650 mg via ORAL

## 2017-06-30 NOTE — Patient Instructions (Signed)
Cancer Center Discharge Instructions for Patients Receiving Chemotherapy Today you received the following chemotherapy agents:  Herceptin To help prevent nausea and vomiting after your treatment, we encourage you to take your nausea medication as prescribed.   If you develop nausea and vomiting that is not controlled by your nausea medication, call the clinic.   BELOW ARE SYMPTOMS THAT SHOULD BE REPORTED IMMEDIATELY:  *FEVER GREATER THAN 100.5 F  *CHILLS WITH OR WITHOUT FEVER  NAUSEA AND VOMITING THAT IS NOT CONTROLLED WITH YOUR NAUSEA MEDICATION  *UNUSUAL SHORTNESS OF BREATH  *UNUSUAL BRUISING OR BLEEDING  TENDERNESS IN MOUTH AND THROAT WITH OR WITHOUT PRESENCE OF ULCERS  *URINARY PROBLEMS  *BOWEL PROBLEMS  UNUSUAL RASH Items with * indicate a potential emergency and should be followed up as soon as possible.  Feel free to call the clinic should you have any questions or concerns. The clinic phone number is [phone].  Please show the CHEMO ALERT CARD at check-in to the Emergency Department and triage nurse.   

## 2017-06-30 NOTE — Progress Notes (Signed)
Patient Care Team: Panosh, Standley Brooking, MD as PCP - General Delsa Bern, MD (Obstetrics and Gynecology) Excell Seltzer, MD as Consulting Physician (General Surgery) Nicholas Lose, MD as Consulting Physician (Hematology and Oncology) Kyung Rudd, MD as Consulting Physician (Radiation Oncology)  DIAGNOSIS:  Encounter Diagnosis  Name Primary?  . Malignant neoplasm of overlapping sites of left breast in female, estrogen receptor positive ([place])     SUMMARY OF ONCOLOGIC HISTORY:   Malignant neoplasm of overlapping 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 breast ([place])   01/12/2017 Surgery    Left mastectomy: IDC with DCIS, 2 tumors 3.6 cm ER 90%, PR 95%, Ki-67 5%, HER-2 negative ratio 1.32) and 1.2 cm (ER 95% PR 95% positive Her 2 Positive ratio 3.2, Ki-67 5%) T2 N0 stage Ib      02/16/2017 -  Chemotherapy    TCH x6 followed by Herceptin maintenance        Malignant neoplasm of overlapping sites of right breast in female, estrogen receptor positive ([place])   12/21/2016 Initial Diagnosis    Palpable lumps in the right breast with nipple inversion: Mammogram revealed skin thickening and distortion, ultrasound revealed 4 masses 3.1 cm at 11:00, 2.5 cm at 6:00, 4 cm at 8:00, 2.2 cm at 5:00 with 2 abnormal axillary lymph nodes: MRI revealed 7 cm abnormality on right breast, in addition 3.6 cm abnormality in the left breast and 2 additional masses 1 cm each; right breast: T4 N1 stage III B clinical stage; left breast: T2 N0 stage IB clinical stage      12/21/2016 Pathology Results    Right breast: Grade 1 Invasive lobular cancer, lymph node also positive, ER 70%, PR 90%, Ki-67 10%, HER-2 negative ratio 1.09 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  The Multi-Cancer Panel was ordered. The Multi-Cancer Panel offered by [facility] includes  sequencing and/or deletion duplication testing of the following 83 genes: ALK, APC, ATM, AXIN2,BAP1,  BARD1, BLM, BMPR1A, BRCA1, BRCA2, BRIP1, CASR, CDC73, CDH1, CDK4, CDKN1B, CDKN1C, CDKN2A (p14ARF), CDKN2A (p16INK4a), CEBPA, CHEK2, CTNNA1, DICER1, DIS3L2, EGFR (c.2369C>T, p.Thr790Met variant only), EPCAM (Deletion/duplication testing only), FH, FLCN, GATA2, GPC3, GREM1 (Promoter region deletion/duplication testing only), HOXB13 (c.251G>A, p.Gly84Glu), HRAS, KIT, MAX, MEN1, MET, MITF (c.952G>A, p.Glu318Lys variant only), MLH1, MSH2, MSH3, MSH6, MUTYH, NBN, NF1, NF2, NTHL1, PALB2, PDGFRA, PHOX2B, PMS2, POLD1, POLE, POT1, PRKAR1A, PTCH1, PTEN, RAD50, RAD51C, RAD51D, RB1, RECQL4, RET, RUNX1, SDHAF2, SDHA (sequence changes only), SDHB, SDHC, SDHD, SMAD4, SMARCA4, SMARCB1, SMARCE1, STK11, SUFU, TERC, TERT, TMEM127, TP53, TSC1, TSC2, VHL, WRN and WT1.   Results: Negative, no pathogenic variants identified.  The date of this test report is 01/04/2017.        01/12/2017 Surgery    Right mastectomy: Invasive and in situ lobular carcinoma involves the dermis of the skin of the nipple, margins negative, 3/3 lymph nodes positive, grade 1, EF 75%, PR 95%, HER-2 negative ratio 1.19, Ki-67 10%, T3N1A stage IIa       CHIEF COMPLIANT: Herceptin maintenance therapy  INTERVAL HISTORY: Amanda Williams is a 52 year old with above-mentioned history of bilateral breast cancers who is currently on Herceptin maintenance after completion of adjuvant chemotherapy.  She is seeing radiation oncology and will start radiation therapy soon.  She denies any major side effects to Herceptin.  On her echocardiogram there was a  note made for a grade 1 diastolic dysfunction.  We are checking with cardiology about this.  REVIEW OF SYSTEMS:   Constitutional: Denies fevers, chills or abnormal weight loss Eyes: Denies blurriness of vision Ears, nose, mouth, throat, and face: Denies mucositis or sore throat Respiratory: Denies cough,  dyspnea or wheezes Cardiovascular: Denies palpitation, chest discomfort Gastrointestinal:  Denies nausea, heartburn or change in bowel habits Skin: Denies abnormal skin rashes Lymphatics: Denies new lymphadenopathy or easy bruising Neurological:Denies numbness, tingling or new weaknesses Behavioral/Psych: Mood is stable, no new changes  Extremities: No lower extremity edema  All other systems were reviewed with the patient and are negative.  I have reviewed the past medical history, past surgical history, social history and family history with the patient and they are unchanged from previous note.  ALLERGIES:  is allergic to penicillins; sulfonamide derivatives; and adhesive [tape].  MEDICATIONS:  Current Outpatient Medications  Medication Sig Dispense Refill  . acetaminophen (TYLENOL) 500 MG tablet Take 1,000 mg 2 (two) times daily as needed by mouth for moderate pain.    Marland Kitchen dexamethasone (DECADRON) 4 MG tablet Take 1 tablet (4 mg total) by mouth daily. Take 1 tablet a day before chemo and 1 tablet a day after 12 tablet 0  . ibuprofen (ADVIL,MOTRIN) 200 MG tablet Take 400 mg 2 (two) times daily as needed by mouth for moderate pain.    Marland Kitchen lidocaine-prilocaine (EMLA) cream Apply to affected area once 30 g 3  . LORazepam (ATIVAN) 0.5 MG tablet TAKE 1 TABLET BY MOUTH EVERY 6 HOURS AS NEEDED FOR NAUSEA (Patient not taking: Reported on 05/10/2017) 30 tablet 1  . ondansetron (ZOFRAN) 8 MG tablet Take 1 tablet (8 mg total) by mouth 2 (two) times daily as needed for refractory nausea / vomiting. Start on day 3 after chemo. 30 tablet 1  . prochlorperazine (COMPAZINE) 10 MG tablet Take 1 tablet (10 mg total) by mouth every 6 (six) hours as needed (Nausea or vomiting). 90 tablet 2  . sertraline (ZOLOFT) 100 MG tablet Take 100 mg every evening by mouth.     . zolpidem (AMBIEN) 5 MG tablet TAKE 1 TABLET (5 MG TOTAL) BY MOUTH AT BEDTIME AS NEEDED FOR SLEEP. 30 tablet 2   No current facility-administered  medications for this visit.     PHYSICAL EXAMINATION: ECOG PERFORMANCE STATUS: 1 - Symptomatic but completely ambulatory  Vitals:   06/30/17 0904  BP: 109/75  Pulse: 84  Resp: 17  Temp: 98.8 F (37.1 C)  SpO2: 99%   Filed Weights   06/30/17 0904  Weight: 152 lb 8 oz (69.2 kg)    GENERAL:alert, no distress and comfortable SKIN: skin color, texture, turgor are normal, no rashes or significant lesions EYES: normal, Conjunctiva are pink and non-injected, sclera clear OROPHARYNX:no exudate, no erythema and lips, buccal mucosa, and tongue normal  NECK: supple, thyroid normal size, non-tender, without nodularity LYMPH:  no palpable lymphadenopathy in the cervical, axillary or inguinal LUNGS: clear to auscultation and percussion with normal breathing effort HEART: regular rate & rhythm and no murmurs and no lower extremity edema ABDOMEN:abdomen soft, non-tender and normal bowel sounds MUSCULOSKELETAL:no cyanosis of digits and no clubbing  NEURO: alert & oriented x 3 with fluent speech, no focal motor/sensory deficits EXTREMITIES: No lower extremity edema  LABORATORY DATA:  I have reviewed the data as listed CMP Latest Ref Rng & Units 06/09/2017 05/19/2017 04/27/2017  Glucose 70 - 140 mg/dL 212(H) 116 93  BUN 7 - 26 mg/dL 13 19  11  Creatinine 0.60 - 1.10 mg/dL 0.87 0.80 0.79  Sodium 136 - 145 mmol/L 138 139 140  Potassium 3.5 - 5.1 mmol/L 4.1 3.4(L) 3.8  Chloride 98 - 109 mmol/L 104 103 104  CO2 22 - 29 mmol/L _0 Calcium 8.4 - 10.4 mg/dL 9.0 8.6 8.6  Total Protein 6.4 - 8.3 g/dL 6.8 6.2(L) 6.7  Total Bilirubin 0.2 - 1.2 mg/dL 0.4 0.3 0.3  Alkaline Phos 40 - 150 U/L 84 76 93  AST 5 - 34 U/L _1 ALT 0 - 55 U/L _2 Lab Results  Component Value Date   WBC 1.7 (L) 06/09/2017   HGB 10.0 (L) 03/17/2017   HCT 30.9 (L) 06/09/2017   MCV 93.8 06/09/2017   PLT 169 06/09/2017   NEUTROABS 1.4 (L) 06/09/2017    ASSESSMENT & PLAN:  Malignant neoplasm of  overlapping 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 breast ([place]) 01/12/2017:Left mastectomy: IDC with DCIS, 2 tumors 3.6 cm ER 90%, PR 95%, Ki-67 5%, HER-2 negative ratio 1.32) and 1.2 cm (ER 95% PR 95% positive Her 2 Positive ratio 3.2, Ki-67 5%) T2 N0 stage Ib Right mastectomy: Invasive and in situ lobular carcinoma involves the dermis of the skin of the nipple, margins negative, 3/3 lymph nodes positive, grade 1, EF 75%, PR 95%, HER-2 negative ratio 1.19, Ki-67 10%, T3N1A stage IIa  Treatment plan: 1. Adjuvant chemotherapy with TCH followed by Herceptin maintenance for 1 year 2. Adjuvant radiation therapy 3. Followed by adjuvant antiestrogen therapy __________________________________________________________________________________________ Echo on 02/01/17: 55% - 60% Current treatment: completed Cycle 6day 1TCH (Without Taxotere), now on Herceptin maintenance  Tolerating Herceptin maintenance extremely well. Labs have been reviewed Closely monitoring for toxicities She will start XRT soon   Grade 1 diastolic dysfunction: I sent a message to Dr. Haroldine Laws to review her echo and determine if she needs to see him for a follow-up   Return to clinic every 3 weeks for Herceptin every 6 weeks for follow-up with me.     No orders of the defined types were placed in this encounter.  The patient has a good understanding of the overall plan. she agrees with it. she will call with any problems that may develop before the next visit here.   Harriette Ohara, MD 06/30/17

## 2017-07-01 ENCOUNTER — Telehealth: Payer: Self-pay | Admitting: Hematology and Oncology

## 2017-07-01 NOTE — Telephone Encounter (Signed)
Left message for patient regarding upcoming may and June appointments

## 2017-07-01 NOTE — H&P (Signed)
Subjective:     Patient ID: Amanda Williams is a 52 y.o. female.  HPI  6 months post op. Completed adjuvant chemotherapy. Continues on Herceptin. She is here to discuss planned implant exchange prior to radiation. She has met with Drs. Pestana and Bhatt since last visit and both counseled not sufficient abdominal tissue to make larger breasts than present with expanders.    Presented with palpable mass with nipple inversion of the right breast. MMG noted distortion in the posterior aspect of the breast and thickening of the skin. [place] of RIGHT breast revealed a 3.1 cm mass at 11:00, 2.5 cm mass at 6:00, 4 cm mass at 8:00, and a 2.2 cm mass at 5:00. In the axilla 2 of the lymph nodes had cortical thickening. Biopsy of the right breast at the 11:00, 5:30, and 8:00 positions showed ILC, ER/PR +, HER2 -.  The axillary node was positive for carcinoma.   MRI demonstrated enhancement of the skin, with patchy nodular enhancement of bilateral breasts. Within the RIGHT breast, there was diffuse enhancement in all 4 quadrants. On the LEFT there were three spiculated masses 4 quadrants. LEFT breast US revealed 3.6 cm mass at 9:00 and 1 cm mass at 12:00, and her axilla was negative. No [place] correlate for third MRI site. Biopsy of two sites noted on [place] with IDC.  Final pathology LEFT IDC with DCIS, two tumors : 3.6 cm ER/PR+, Her2- and 1.2 cm ER/PR+, Her2+, margins clear 0/5 SLN. 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   She will receive adjuvant radiation to right. CT sim planned for 5.14.19.  Genetics negative. Tamoxifen started neoadjuvantly. Staging scans negative for distant disease.  Prior 22 B, desires larger. 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 Have 2 teenage children at home.      Objective:   Physical Exam  Cardiovascular: Normal rate, regular rhythm and normal heart sounds.   Pulmonary/Chest: Effort  normal and breath sounds normal.   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 I will defer fat grafting until after radiation complete. Given prepectoral placement recommend silicone implants to reduce risk rippling. Reviewed capacity filled implants vs highly cohesive gel vs saline and gave examples of each. Counseled my preference for capacity filled. Reviewed HCG implants may offer less rippling over capacity filled, but with firmer feel, rounder appearance. Discussed MRI surveillance silicone, risks rupture, CC, malposition, AP flipping. Plan smooth round silicone, plan capacity filled.  With regards to size, reviewed BW will be guide for this and anticipate will end up with implant in 550-700 ml range. Cannot assure her what cup size this will be. Discussed the contour depression especially in area of right axilla will not change with this surgery. Reviewed these depressions related to her MRM and complete removal axillary contents. I would address these areas with fat grafting but we will delay fat grafting until after XRT. Reviewed this could be early post radiation before radiation fibrosis sets in vs delayed by several months  to allow soft tissue to soften post XRT. Reviewed variable take fat grafting and may need to repeat.   Additional risks including but not limited to infection, additional surgery, damage to deeper structures, DVT/PE, seroma, hematoma, cardiopulmonary complications.   Rx for oxycodone and clindamycin given.   Natrelle 133FV-12-T 400 ml tissue expander placed bilateral,  fill 465  ml saline right  initial fill 375 ml saline left   Irene Limbo, MD [facility] Plastic & Reconstructive Surgery [phone], pin [phone]

## 2017-07-05 ENCOUNTER — Telehealth: Payer: Self-pay | Admitting: Hematology and Oncology

## 2017-07-05 NOTE — Telephone Encounter (Signed)
I left a message on the patient's phone that Dr. Haroldine Laws felt the grade 1 diastolic dysfunction was relatively common finding in people who are older than age 52.  He was happy to follow-up with her.  I left a message for the patient to call us back if she would like to see a cardiologist.

## 2017-07-12 NOTE — Pre-Procedure Instructions (Signed)
Amanda Williams  07/12/2017      [id number] - [place], [place] - [address] [place] [address] Phone: [phone] Fax: [phone]    Your procedure is scheduled on April 26  Report to [facility] at [phone] A.M.  Call this number if you have problems the morning of surgery:  [phone]   Remember:  Do not eat food or drink liquids after midnight.  Continue all medications as directed by your physician except follow these medication instructions before surgery below   Take these medicines the morning of surgery with A SIP OF WATER  acetaminophen (TYLENOL)  7 days prior to surgery STOP taking any Aspirin(unless otherwise instructed by your surgeon), Aleve, Naproxen, Ibuprofen, Motrin, Advil, Goody's, BC's, all herbal medications, fish oil, and all vitamins    Do not wear jewelry, make-up or nail polish.  Do not wear lotions, powders, or perfumes, or deodorant.  Do not shave 48 hours prior to surgery.    Do not bring valuables to the hospital.  [facility] is not responsible for any belongings or valuables.  Contacts, dentures or bridgework may not be worn into surgery.  Leave your suitcase in the car.  After surgery it may be brought to your room.  For patients admitted to the hospital, discharge time will be determined by your treatment team.  Patients discharged the day of surgery will not be allowed to drive home.    Special instructions:   [facility]- Preparing For Surgery  Before surgery, you can play an important role. Because skin is not sterile, your skin needs to be as free of germs as possible. You can reduce the number of germs on your skin by washing with CHG (chlorahexidine gluconate) Soap before surgery.  CHG is an antiseptic cleaner which kills germs and bonds with the skin to continue killing germs even after washing.  Please do not use if you have an allergy to CHG or antibacterial soaps. If your  skin becomes reddened/irritated stop using the CHG.  Do not shave (including legs and underarms) for at least 48 hours prior to first CHG shower. It is OK to shave your face.  Please follow these instructions carefully.   1. Shower the NIGHT BEFORE SURGERY and the MORNING OF SURGERY with CHG.   2. If you chose to wash your hair, wash your hair first as usual with your normal shampoo.  3. After you shampoo, rinse your hair and body thoroughly to remove the shampoo.  4. Use CHG as you would any other liquid soap. You can apply CHG directly to the skin and wash gently with a scrungie or a clean washcloth.   5. Apply the CHG Soap to your body ONLY FROM THE NECK DOWN.  Do not use on open wounds or open sores. Avoid contact with your eyes, ears, mouth and genitals (private parts). Wash Face and genitals (private parts)  with your normal soap.  6. Wash thoroughly, paying special attention to the area where your surgery will be performed.  7. Thoroughly rinse your body with warm water from the neck down.  8. DO NOT shower/wash with your normal soap after using and rinsing off the CHG Soap.  9. Pat yourself dry with a CLEAN TOWEL.  10. Wear CLEAN PAJAMAS to bed the night before surgery, wear comfortable clothes the morning of surgery  11. Place CLEAN SHEETS on your bed the night of your first shower  and DO NOT SLEEP WITH PETS.    Day of Surgery: Do not apply any deodorants/lotions. Please wear clean clothes to the hospital/surgery center.      Please read over the following fact sheets that you were given.

## 2017-07-13 ENCOUNTER — Encounter (HOSPITAL_COMMUNITY): Payer: Self-pay

## 2017-07-13 ENCOUNTER — Encounter (HOSPITAL_COMMUNITY)
Admission: RE | Admit: 2017-07-13 | Discharge: 2017-07-13 | Disposition: A | Discharge status: Home / Self Care | Payer: 59 | Source: Ambulatory Visit | Attending: Plastic Surgery | Admitting: Plastic Surgery

## 2017-07-13 ENCOUNTER — Other Ambulatory Visit: Payer: Self-pay

## 2017-07-13 DIAGNOSIS — F419 Anxiety disorder, unspecified: Secondary | ICD-10-CM | POA: Diagnosis not present

## 2017-07-13 DIAGNOSIS — C50811 Malignant neoplasm of overlapping sites of right female breast: Secondary | ICD-10-CM | POA: Diagnosis not present

## 2017-07-13 DIAGNOSIS — Z9221 Personal history of antineoplastic chemotherapy: Secondary | ICD-10-CM | POA: Diagnosis not present

## 2017-07-13 DIAGNOSIS — Z421 Encounter for breast reconstruction following mastectomy: Secondary | ICD-10-CM | POA: Diagnosis not present

## 2017-07-13 DIAGNOSIS — Z170 Estrogen receptor positive status [ER+]: Secondary | ICD-10-CM | POA: Diagnosis not present

## 2017-07-13 DIAGNOSIS — Z9013 Acquired absence of bilateral breasts and nipples: Secondary | ICD-10-CM | POA: Diagnosis not present

## 2017-07-13 DIAGNOSIS — Z01812 Encounter for preprocedural laboratory examination: Secondary | ICD-10-CM | POA: Diagnosis not present

## 2017-07-13 DIAGNOSIS — Z79899 Other long term (current) drug therapy: Secondary | ICD-10-CM | POA: Diagnosis not present

## 2017-07-13 DIAGNOSIS — C50212 Malignant neoplasm of upper-inner quadrant of left female breast: Secondary | ICD-10-CM | POA: Diagnosis not present

## 2017-07-13 DIAGNOSIS — C773 Secondary and unspecified malignant neoplasm of axilla and upper limb lymph nodes: Secondary | ICD-10-CM | POA: Diagnosis not present

## 2017-07-13 HISTORY — DX: Personal history of urinary calculi: Z87.442

## 2017-07-13 LAB — CBC WITH DIFFERENTIAL/PLATELET
BASOS PCT: 1 %
Basophils Absolute: 0 10*3/uL (ref 0.0–0.1)
Eosinophils Absolute: 0 10*3/uL (ref 0.0–0.7)
Eosinophils Relative: 1 %
HEMATOCRIT: 32.7 % — AB (ref 36.0–46.0)
HEMOGLOBIN: 10.6 g/dL — AB (ref 12.0–15.0)
Lymphocytes Relative: 47 %
Lymphs Abs: 1.7 10*3/uL (ref 0.7–4.0)
MCH: 31.7 pg (ref 26.0–34.0)
MCHC: 32.4 g/dL (ref 30.0–36.0)
MCV: 97.9 fL (ref 78.0–100.0)
Monocytes Absolute: 0.3 10*3/uL (ref 0.1–1.0)
Monocytes Relative: 8 %
NEUTROS ABS: 1.5 10*3/uL — AB (ref 1.7–7.7)
NEUTROS PCT: 43 %
Platelets: 276 10*3/uL (ref 150–400)
RBC: 3.34 MIL/uL — AB (ref 3.87–5.11)
RDW: 15 % (ref 11.5–15.5)
WBC: 3.5 10*3/uL — AB (ref 4.0–10.5)

## 2017-07-13 NOTE — Progress Notes (Addendum)
PCP is Dr Shanon Ace Denies seeing a cardiologist. Denies chest pain, cough, or fever. Denies having a stress test or card cath. Echo noted 2019 States she was a difficult intubation in Oct 2018 Amanda Gianotti, PA-C (with Anesthesia)  called and informed.

## 2017-07-14 NOTE — Progress Notes (Signed)
Anesthesia Chart Review:  Case:  [id number] Date/Time:  07/16/17 0830   Procedure:  BILATERAL REMOVAL OF TISSUE EXPANDER AND PLACEMENT OF SILICONE IMPLANT (Bilateral )   Anesthesia type:  General   Pre-op diagnosis:  BILATERAL BREAST CANCER   Location:  [facility] OR ROOM 08 / [facility]   Surgeon:  Irene Limbo, MD     DISCUSSION: Patient is a 52 year old female with history of bilateral breast cancer (s/p right modified radical mastectomy, left total mastectomy 01/12/17; s/p TCH chemotherapy x6, completed 06/09/17; on Herceptin maintenance X 1 year; future anti-estrogen and radiation therapy planned) scheduled for the above procedure.   She has a history of a DIFFICULT INTUBATION from 01/12/17. According to her anesthesia record: 01/12/2017: Ventilation: Mask ventilation without difficulty Laryngoscope Size: Mac, 3 and Glidescope Grade View: Grade III Tube type: Oral Tube size: 7.0 mm Number of attempts: 3 Airway Equipment and Method: Stylet,  Oral airway and Video-laryngoscopy Dental Injury: Teeth and Oropharynx as per pre-operative assessment  Difficulty Due To: Difficulty was unanticipated Future Recommendations: Recommend- induction with short-acting agent, and alternative techniques readily available Comments: Smooth IV induction, EASY MASK airway, very limited oral opening to instrument the airway, airway anterior, One attempt unable to secure airway with MAC3 unable to visualize vocal cords, second attempt with glide scope partial vocal cords visualized and unable to pass tube, third attempt (second with glide scope) Dr Ola Spurr vocal cords partial visualized able to place 7.0 ETT . +ETCO2, + BBS, dentition unchanged  LMA was used during 02/15/17 Port-a-cath insertion.    She'll need a urine pregnancy test on the day of surgery. Currently, she last received Herceptin 06/30/17.  VS: BP 99/63   Pulse 80   Temp 36.8 C   Resp 18   Ht _0  (1.676 m)   Wt 153 lb 12.8 oz (69.8 kg)   LMP  03/14/2017   SpO2 100%   BMI 24.82 kg/m   PROVIDERS: Panosh, Standley Brooking, MD as PCP Patient Care Team: Excell Seltzer, MD as Consulting Physician (General Surgery) Nicholas Lose, MD as Consulting Physician (Hematology and Oncology) Kyung Rudd, MD as Consulting Physician (Radiation Oncology) Rolm Bookbinder, MD as General Surgeon  LABS: Labs reviewed: Acceptable for surgery. H/H stable at 10.6/32.7. CMET WNL on 06/30/17.  (all labs ordered are listed, but only abnormal results are displayed)  Labs Reviewed  CBC WITH DIFFERENTIAL/PLATELET - Abnormal; Notable for the following components:      Result Value   WBC 3.5 (*)    RBC 3.34 (*)    Hemoglobin 10.6 (*)    HCT 32.7 (*)    Neutro Abs 1.5 (*)    All other components within normal limits   IMAGES: 1V CXR 02/15/17: IMPRESSION: Right Port-A-Cath placement with the tip in the SVC. No pneumothorax. No active cardiopulmonary disease.  EKG: N/A  CV: Echo 06/16/17 (done for Herceptin monitoring): Study Conclusions - Left ventricle: The cavity size was normal. Wall thickness was   increased in a pattern of mild LVH. Systolic function was normal.   The estimated ejection fraction was in the range of 55% to 60%.   GLS is normal at -19%. Wall motion was normal; there were no   regional wall motion abnormalities. Doppler parameters are   consistent with abnormal left ventricular relaxation (grade 1   diastolic dysfunction). The E/e&' ratio is between 8-15,   suggesting indeterminate LV filling pressure. - Left atrium: The atrium was normal in size. - Inferior vena cava: The vessel was  normal in size. The   respirophasic diameter changes were in the normal range (>= 50%),   consistent with normal central venous pressure. Impressions: - Compared to a prior study in 01/2017, GLS is slightly lower at   -19% (compared to -21%), but still normal. LVEF is unchanged.  Past Medical History:  Diagnosis Date  . Allergy   . Breast  cancer ([place])   . Difficult intubation    see 01/12/17 anesthesia record  . Family history of breast cancer   . Hematuria    ? trigonitis  . History of kidney stones   . Panic attack    echo 2009 normal    Past Surgical History:  Procedure Laterality Date  . BREAST BIOPSY     left  . BREAST RECONSTRUCTION WITH PLACEMENT OF TISSUE EXPANDER AND FLEX HD (ACELLULAR HYDRATED DERMIS) Bilateral 01/12/2017   Procedure: BREAST RECONSTRUCTION WITH PLACEMENT OF TISSUE EXPANDER AND ALLODERM;  Surgeon: Irene Limbo, MD;  Location: [facility];  Service: Plastics;  Laterality: Bilateral;  . COLONOSCOPY    . MASTECTOMY W/ SENTINEL NODE BIOPSY Bilateral 01/12/2017   Procedure: RIGHT MODIFIED RADICAL MASTECTOMY, LEFT TOTAL MASTECTOMY WITH LEFT SENTINEL LYMPH NODE BIOPSY;  Surgeon: Rolm Bookbinder, MD;  Location: [facility];  Service: General;  Laterality: Bilateral;  . PORTACATH PLACEMENT N/A 02/15/2017   Procedure: INSERTION PORT-A-CATH WITH [place];  Surgeon: Rolm Bookbinder, MD;  Location: [facility];  Service: General;  Laterality: N/A;    MEDICATIONS: . acetaminophen (TYLENOL) 500 MG tablet  . dexamethasone (DECADRON) 4 MG tablet  . ibuprofen (ADVIL,MOTRIN) 200 MG tablet  . lidocaine-prilocaine (EMLA) cream  . LORazepam (ATIVAN) 0.5 MG tablet  . ondansetron (ZOFRAN) 8 MG tablet  . prochlorperazine (COMPAZINE) 10 MG tablet  . sertraline (ZOLOFT) 100 MG tablet  . zolpidem (AMBIEN) 5 MG tablet   No current facility-administered medications for this encounter.    She was taking Decadron on day before and after chemotherapy.   George Hugh [facility] Short Stay Center/Anesthesiology Phone [phone] 07/14/2017 10:28 AM

## 2017-07-14 NOTE — Anesthesia Preprocedure Evaluation (Addendum)
Anesthesia Evaluation  Patient identified by MRN, date of birth, ID band Patient awake    Reviewed: Allergy & Precautions, NPO status , Patient's Chart, lab work & pertinent test results  History of Anesthesia Complications (+) DIFFICULT AIRWAY and history of anesthetic complications  Airway Mallampati: I       Dental no notable dental hx. (+) Teeth Intact   Pulmonary neg pulmonary ROS,    Pulmonary exam normal breath sounds clear to auscultation       Cardiovascular negative cardio ROS Normal cardiovascular exam Rhythm:Regular Rate:Normal     Neuro/Psych PSYCHIATRIC DISORDERS Anxiety negative neurological ROS     GI/Hepatic negative GI ROS, Neg liver ROS,   Endo/Other  negative endocrine ROS  Renal/GU negative Renal ROS  negative genitourinary   Musculoskeletal negative musculoskeletal ROS (+)   Abdominal Normal abdominal exam  (+)   Peds  Hematology  (+) Blood dyscrasia, anemia ,   Anesthesia Other Findings   Reproductive/Obstetrics                             Anesthesia Physical  Anesthesia Plan  ASA: II  Anesthesia Plan: General   Post-op Pain Management:    Induction: Intravenous  PONV Risk Score and Plan: 3 and Ondansetron and Dexamethasone  Airway Management Planned: LMA  Additional Equipment:   Intra-op Plan:   Post-operative Plan:   Informed Consent: I have reviewed the patients History and Physical, chart, labs and discussed the procedure including the risks, benefits and alternatives for the proposed anesthesia with the patient or authorized representative who has indicated his/her understanding and acceptance.     Plan Discussed with: CRNA and Surgeon  Anesthesia Plan Comments:         Anesthesia Quick Evaluation  

## 2017-07-16 ENCOUNTER — Ambulatory Visit (HOSPITAL_COMMUNITY): Payer: 59 | Admitting: Vascular Surgery

## 2017-07-16 ENCOUNTER — Encounter (HOSPITAL_COMMUNITY): Payer: Self-pay

## 2017-07-16 ENCOUNTER — Encounter (HOSPITAL_COMMUNITY)
Admission: RE | Disposition: A | Discharge status: Home / Self Care | Payer: Self-pay | Source: Ambulatory Visit | Attending: Plastic Surgery

## 2017-07-16 ENCOUNTER — Ambulatory Visit (HOSPITAL_COMMUNITY): Payer: 59 | Admitting: Certified Registered Nurse Anesthetist

## 2017-07-16 ENCOUNTER — Ambulatory Visit (HOSPITAL_COMMUNITY)
Admission: RE | Admit: 2017-07-16 | Discharge: 2017-07-16 | Disposition: A | Discharge status: Home / Self Care | Payer: 59 | Source: Ambulatory Visit | Attending: Plastic Surgery | Admitting: Plastic Surgery

## 2017-07-16 DIAGNOSIS — C50212 Malignant neoplasm of upper-inner quadrant of left female breast: Secondary | ICD-10-CM | POA: Insufficient documentation

## 2017-07-16 DIAGNOSIS — F419 Anxiety disorder, unspecified: Secondary | ICD-10-CM | POA: Insufficient documentation

## 2017-07-16 DIAGNOSIS — Z9013 Acquired absence of bilateral breasts and nipples: Secondary | ICD-10-CM | POA: Insufficient documentation

## 2017-07-16 DIAGNOSIS — Z421 Encounter for breast reconstruction following mastectomy: Secondary | ICD-10-CM | POA: Diagnosis not present

## 2017-07-16 DIAGNOSIS — Z170 Estrogen receptor positive status [ER+]: Secondary | ICD-10-CM | POA: Insufficient documentation

## 2017-07-16 DIAGNOSIS — Z01812 Encounter for preprocedural laboratory examination: Secondary | ICD-10-CM | POA: Insufficient documentation

## 2017-07-16 DIAGNOSIS — Z79899 Other long term (current) drug therapy: Secondary | ICD-10-CM | POA: Insufficient documentation

## 2017-07-16 DIAGNOSIS — C50811 Malignant neoplasm of overlapping sites of right female breast: Secondary | ICD-10-CM | POA: Insufficient documentation

## 2017-07-16 DIAGNOSIS — Z9221 Personal history of antineoplastic chemotherapy: Secondary | ICD-10-CM | POA: Insufficient documentation

## 2017-07-16 DIAGNOSIS — C773 Secondary and unspecified malignant neoplasm of axilla and upper limb lymph nodes: Secondary | ICD-10-CM | POA: Insufficient documentation

## 2017-07-16 HISTORY — PX: REMOVAL OF TISSUE EXPANDER AND PLACEMENT OF IMPLANT: SHX6457

## 2017-07-16 LAB — HCG, SERUM, QUALITATIVE: Preg, Serum: NEGATIVE

## 2017-07-16 SURGERY — REMOVAL, TISSUE EXPANDER, BREAST, WITH IMPLANT INSERTION
Anesthesia: General | Site: Breast | Laterality: Bilateral

## 2017-07-16 MED ORDER — MEPERIDINE HCL 50 MG/ML IJ SOLN: 6.2500 mg | INTRAMUSCULAR | Status: DC | PRN

## 2017-07-16 MED ORDER — MIDAZOLAM HCL 5 MG/5ML IJ SOLN
INTRAMUSCULAR | Status: DC | PRN
  Administered 2017-07-16: 2 mg via INTRAVENOUS

## 2017-07-16 MED ORDER — CELECOXIB 200 MG PO CAPS
200.0000 mg | ORAL_CAPSULE | ORAL | Status: AC
  Administered 2017-07-16: 200 mg via ORAL
  Filled 2017-07-16: qty 1

## 2017-07-16 MED ORDER — CHLORHEXIDINE GLUCONATE CLOTH 2 % EX PADS: 6.0000 | MEDICATED_PAD | Freq: Once | CUTANEOUS | Status: DC

## 2017-07-16 MED ORDER — LIDOCAINE 2% (20 MG/ML) 5 ML SYRINGE
INTRAMUSCULAR | Status: DC | PRN
  Administered 2017-07-16: 60 mg via INTRAVENOUS

## 2017-07-16 MED ORDER — ACETAMINOPHEN 500 MG PO TABS
1000.0000 mg | ORAL_TABLET | ORAL | Status: AC
  Administered 2017-07-16: 1000 mg via ORAL
  Filled 2017-07-16: qty 2

## 2017-07-16 MED ORDER — OXYCODONE HCL 5 MG PO TABS: 5.0000 mg | ORAL_TABLET | Freq: Once | ORAL | Status: DC | PRN

## 2017-07-16 MED ORDER — PHENYLEPHRINE 40 MCG/ML (10ML) SYRINGE FOR IV PUSH (FOR BLOOD PRESSURE SUPPORT)
PREFILLED_SYRINGE | INTRAVENOUS | Status: AC
  Filled 2017-07-16: qty 10

## 2017-07-16 MED ORDER — KETOROLAC TROMETHAMINE 30 MG/ML IJ SOLN
30.0000 mg | Freq: Once | INTRAMUSCULAR | Status: AC | PRN
  Administered 2017-07-16: 30 mg via INTRAVENOUS

## 2017-07-16 MED ORDER — FENTANYL CITRATE (PF) 250 MCG/5ML IJ SOLN
INTRAMUSCULAR | Status: DC | PRN
  Administered 2017-07-16: 50 ug via INTRAVENOUS

## 2017-07-16 MED ORDER — KETOROLAC TROMETHAMINE 30 MG/ML IJ SOLN
INTRAMUSCULAR | Status: AC
  Filled 2017-07-16: qty 1

## 2017-07-16 MED ORDER — BUPIVACAINE HCL (PF) 0.25 % IJ SOLN
INTRAMUSCULAR | Status: AC
  Filled 2017-07-16: qty 30

## 2017-07-16 MED ORDER — PROPOFOL 10 MG/ML IV BOLUS
INTRAVENOUS | Status: AC
  Filled 2017-07-16: qty 20

## 2017-07-16 MED ORDER — FENTANYL CITRATE (PF) 100 MCG/2ML IJ SOLN: INTRAMUSCULAR | Status: DC | PRN

## 2017-07-16 MED ORDER — PHENYLEPHRINE HCL 10 MG/ML IJ SOLN: INTRAMUSCULAR | Status: DC | PRN

## 2017-07-16 MED ORDER — FENTANYL CITRATE (PF) 100 MCG/2ML IJ SOLN: 25.0000 ug | INTRAMUSCULAR | Status: DC | PRN

## 2017-07-16 MED ORDER — BUPIVACAINE HCL (PF) 0.25 % IJ SOLN
INTRAMUSCULAR | Status: DC | PRN
  Administered 2017-07-16: 30 mL

## 2017-07-16 MED ORDER — PHENYLEPHRINE 40 MCG/ML (10ML) SYRINGE FOR IV PUSH (FOR BLOOD PRESSURE SUPPORT)
PREFILLED_SYRINGE | INTRAVENOUS | Status: DC | PRN
  Administered 2017-07-16 (×3): 80 ug via INTRAVENOUS
  Administered 2017-07-16: 120 ug via INTRAVENOUS
  Administered 2017-07-16 (×3): 80 ug via INTRAVENOUS
  Administered 2017-07-16: 40 ug via INTRAVENOUS

## 2017-07-16 MED ORDER — POLYMYXIN B SULFATE 500000 UNITS IJ SOLR
INTRAMUSCULAR | Status: AC
  Filled 2017-07-16: qty 500000

## 2017-07-16 MED ORDER — LIDOCAINE 2% (20 MG/ML) 5 ML SYRINGE
INTRAMUSCULAR | Status: AC
  Filled 2017-07-16: qty 5

## 2017-07-16 MED ORDER — ACETAMINOPHEN 160 MG/5ML PO SOLN: 325.0000 mg | ORAL | Status: DC | PRN

## 2017-07-16 MED ORDER — ONDANSETRON HCL 4 MG/2ML IJ SOLN
INTRAMUSCULAR | Status: DC | PRN
  Administered 2017-07-16: 4 mg via INTRAVENOUS

## 2017-07-16 MED ORDER — ONDANSETRON HCL 4 MG/2ML IJ SOLN: 4.0000 mg | Freq: Once | INTRAMUSCULAR | Status: DC | PRN

## 2017-07-16 MED ORDER — VANCOMYCIN HCL IN DEXTROSE 1-5 GM/200ML-% IV SOLN
1000.0000 mg | INTRAVENOUS | Status: AC
  Administered 2017-07-16: 1000 mg via INTRAVENOUS
  Filled 2017-07-16: qty 200

## 2017-07-16 MED ORDER — SUGAMMADEX SODIUM 200 MG/2ML IV SOLN
INTRAVENOUS | Status: DC | PRN
  Administered 2017-07-16: 139.6 mg via INTRAVENOUS

## 2017-07-16 MED ORDER — GABAPENTIN 300 MG PO CAPS
300.0000 mg | ORAL_CAPSULE | ORAL | Status: AC
  Administered 2017-07-16: 300 mg via ORAL
  Filled 2017-07-16: qty 1

## 2017-07-16 MED ORDER — LACTATED RINGERS IV SOLN
INTRAVENOUS | Status: DC
Start: 2017-07-16 — End: 2017-07-16
  Administered 2017-07-16 (×2): via INTRAVENOUS

## 2017-07-16 MED ORDER — ONDANSETRON HCL 4 MG/2ML IJ SOLN
INTRAMUSCULAR | Status: AC
  Filled 2017-07-16: qty 2

## 2017-07-16 MED ORDER — SUCCINYLCHOLINE CHLORIDE 200 MG/10ML IV SOSY
PREFILLED_SYRINGE | INTRAVENOUS | Status: AC
  Filled 2017-07-16: qty 10

## 2017-07-16 MED ORDER — 0.9 % SODIUM CHLORIDE (POUR BTL) OPTIME
TOPICAL | Status: DC | PRN
  Administered 2017-07-16: 1000 mL

## 2017-07-16 MED ORDER — MIDAZOLAM HCL 2 MG/2ML IJ SOLN
INTRAMUSCULAR | Status: AC
  Filled 2017-07-16: qty 2

## 2017-07-16 MED ORDER — ACETAMINOPHEN 325 MG PO TABS: 325.0000 mg | ORAL_TABLET | ORAL | Status: DC | PRN

## 2017-07-16 MED ORDER — SODIUM CHLORIDE 0.9 % IV SOLN
Freq: Once | INTRAVENOUS | Status: AC
  Administered 2017-07-16: 1000 mL
  Filled 2017-07-16: qty 1

## 2017-07-16 MED ORDER — DEXAMETHASONE SODIUM PHOSPHATE 10 MG/ML IJ SOLN
INTRAMUSCULAR | Status: AC
  Filled 2017-07-16: qty 1

## 2017-07-16 MED ORDER — ROCURONIUM BROMIDE 10 MG/ML (PF) SYRINGE
PREFILLED_SYRINGE | INTRAVENOUS | Status: DC | PRN
  Administered 2017-07-16: 40 mg via INTRAVENOUS
  Administered 2017-07-16: 30 mg via INTRAVENOUS

## 2017-07-16 MED ORDER — OXYCODONE HCL 5 MG/5ML PO SOLN: 5.0000 mg | Freq: Once | ORAL | Status: DC | PRN

## 2017-07-16 MED ORDER — FENTANYL CITRATE (PF) 250 MCG/5ML IJ SOLN
INTRAMUSCULAR | Status: AC
  Filled 2017-07-16: qty 5

## 2017-07-16 MED ORDER — PROPOFOL 10 MG/ML IV BOLUS
INTRAVENOUS | Status: DC | PRN
  Administered 2017-07-16: 150 mg via INTRAVENOUS

## 2017-07-16 MED ORDER — DEXAMETHASONE SODIUM PHOSPHATE 10 MG/ML IJ SOLN
INTRAMUSCULAR | Status: DC | PRN
  Administered 2017-07-16: 10 mg via INTRAVENOUS

## 2017-07-16 MED ORDER — ROCURONIUM BROMIDE 10 MG/ML (PF) SYRINGE
PREFILLED_SYRINGE | INTRAVENOUS | Status: AC
  Filled 2017-07-16: qty 5

## 2017-07-16 SURGICAL SUPPLY — 63 items
BAG DECANTER FOR FLEXI CONT (MISCELLANEOUS) ×2 IMPLANT
BINDER BREAST LRG (GAUZE/BANDAGES/DRESSINGS) IMPLANT
BINDER BREAST XLRG (GAUZE/BANDAGES/DRESSINGS) ×2 IMPLANT
CANISTER SUCT 3000ML PPV (MISCELLANEOUS) ×2 IMPLANT
CHLORAPREP W/TINT 26ML (MISCELLANEOUS) ×2 IMPLANT
CLSR STERI-STRIP ANTIMIC 1/2X4 (GAUZE/BANDAGES/DRESSINGS) ×2 IMPLANT
COVER SURGICAL LIGHT HANDLE (MISCELLANEOUS) ×2 IMPLANT
DERMABOND ADVANCED (GAUZE/BANDAGES/DRESSINGS) ×2
DERMABOND ADVANCED .7 DNX12 (GAUZE/BANDAGES/DRESSINGS) ×2 IMPLANT
DRAIN CHANNEL 15F RND FF W/TCR (WOUND CARE) IMPLANT
DRAPE HALF SHEET 40X57 (DRAPES) IMPLANT
DRAPE ORTHO SPLIT 77X108 STRL (DRAPES) ×2
DRAPE SURG ORHT 6 SPLT 77X108 (DRAPES) ×2 IMPLANT
DRAPE WARM FLUID 44X44 (DRAPE) ×2 IMPLANT
DRSG PAD ABDOMINAL 8X10 ST (GAUZE/BANDAGES/DRESSINGS) ×4 IMPLANT
DRSG TEGADERM 4X4.75 (GAUZE/BANDAGES/DRESSINGS) ×8 IMPLANT
ELECT BLADE 4.0 EZ CLEAN MEGAD (MISCELLANEOUS) ×2
ELECT CAUTERY BLADE 6.4 (BLADE) ×2 IMPLANT
ELECT COATED BLADE 2.86 ST (ELECTRODE) ×2 IMPLANT
ELECT REM PT RETURN 9FT ADLT (ELECTROSURGICAL) ×2
ELECTRODE BLDE 4.0 EZ CLN MEGD (MISCELLANEOUS) ×1 IMPLANT
ELECTRODE REM PT RTRN 9FT ADLT (ELECTROSURGICAL) ×1 IMPLANT
EVACUATOR SILICONE 100CC (DRAIN) IMPLANT
GAUZE SPONGE 4X4 12PLY STRL (GAUZE/BANDAGES/DRESSINGS) ×2 IMPLANT
GLOVE BIO SURGEON STRL SZ 6 (GLOVE) ×6 IMPLANT
GOWN STRL REUS W/ TWL LRG LVL3 (GOWN DISPOSABLE) ×2 IMPLANT
GOWN STRL REUS W/TWL LRG LVL3 (GOWN DISPOSABLE) ×2
IMPL BREAST P6.3XRND MDRT 560 (Breast) ×2 IMPLANT
IMPL BREAST P6.4XRND XFULL 580 (Breast) IMPLANT
IMPL BRST P6.3XRND MDRT 560CC (Breast) ×2 IMPLANT
IMPL BRST P6.4XRND XFULL 580CC (Breast) IMPLANT
IMPLANT BREAST GEL 560CC (Breast) ×2 IMPLANT
IMPLANT BREAST GEL 580CC (Breast) IMPLANT
KIT BASIN OR (CUSTOM PROCEDURE TRAY) ×2 IMPLANT
KIT TURNOVER KIT B (KITS) ×2 IMPLANT
MARKER SKIN DUAL TIP RULER LAB (MISCELLANEOUS) ×2 IMPLANT
NEEDLE HYPO 25GX1X1/2 BEV (NEEDLE) ×2 IMPLANT
NS IRRIG 1000ML POUR BTL (IV SOLUTION) ×4 IMPLANT
PACK GENERAL/GYN (CUSTOM PROCEDURE TRAY) ×2 IMPLANT
PAD ABD 8X10 STRL (GAUZE/BANDAGES/DRESSINGS) ×4 IMPLANT
PAD ARMBOARD 7.5X6 YLW CONV (MISCELLANEOUS) ×2 IMPLANT
PIN SAFETY STERILE (MISCELLANEOUS) ×2 IMPLANT
SET ASEPTIC TRANSFER (MISCELLANEOUS) ×2 IMPLANT
SIZER BREAST REUSE GEL 560CC (SIZER) ×2
SIZER BRST REUSE GEL 560CC (SIZER) ×1 IMPLANT
SOL PREP POV-IOD 4OZ 10% (MISCELLANEOUS) ×2 IMPLANT
STAPLER VISISTAT 35W (STAPLE) ×2 IMPLANT
SUT CHROMIC 4 0 PS 2 18 (SUTURE) IMPLANT
SUT ETHILON 2 0 FS 18 (SUTURE) ×4 IMPLANT
SUT MNCRL AB 4-0 PS2 18 (SUTURE) ×4 IMPLANT
SUT PDS AB 2-0 CT2 27 (SUTURE) ×4 IMPLANT
SUT VIC AB 0 CT2 27 (SUTURE) IMPLANT
SUT VIC AB 3-0 SH 27 (SUTURE) ×4
SUT VIC AB 3-0 SH 27X BRD (SUTURE) ×4 IMPLANT
SUT VIC AB 4-0 PS2 18 (SUTURE) ×4 IMPLANT
SUT VICRYL 4-0 PS2 18IN ABS (SUTURE) IMPLANT
SUT VLOC 180 0 24IN GS25 (SUTURE) IMPLANT
SYR BULB IRRIGATION 50ML (SYRINGE) ×2 IMPLANT
TAPE STRIPS DRAPE STRL (GAUZE/BANDAGES/DRESSINGS) ×2 IMPLANT
TOWEL OR 17X24 6PK STRL BLUE (TOWEL DISPOSABLE) ×2 IMPLANT
TOWEL OR 17X26 10 PK STRL BLUE (TOWEL DISPOSABLE) ×2 IMPLANT
TRAY FOLEY W/METER SILVER 16FR (SET/KITS/TRAYS/PACK) IMPLANT
TUBE CONNECTING 12X1/4 (SUCTIONS) ×2 IMPLANT

## 2017-07-16 NOTE — Interval H&P Note (Signed)
History and Physical Interval Note:  07/16/2017 7:05 AM  Amanda Williams  has presented today for surgery, with the diagnosis of BILATERAL BREAST CANCER  The various methods of treatment have been discussed with the patient and family. After consideration of risks, benefits and other options for treatment, the patient has consented to  Procedure(s): BILATERAL REMOVAL OF TISSUE EXPANDER AND PLACEMENT OF SILICONE IMPLANT (Bilateral) as a surgical intervention .  The patient's history has been reviewed, patient examined, no change in status, stable for surgery.  I have reviewed the patient's chart and labs.  Questions were answered to the patient's satisfaction.     Jakaden Ouzts

## 2017-07-16 NOTE — Op Note (Signed)
Operative Note   DATE OF OPERATION: 4.26.19  LOCATION: [facility]-outpatient  SURGICAL DIVISION: Plastic Surgery  PREOPERATIVE DIAGNOSES:  1. History bilateral breast cancer 2. Acquired absence bilateral breast  POSTOPERATIVE DIAGNOSES:  same  PROCEDURE:  Removal bilateral tissue expanders and placement silicone implants  SURGEON: Irene Limbo MD MBA  ASSISTANT: S. Rayburn PA-C  ANESTHESIA:  General.   EBL: 75 ml  COMPLICATIONS: None immediate.   INDICATIONS FOR PROCEDURE:  The patient, Amanda Williams, is a 52 y.o. female born on [birth year], is here for implant exchange following immediate prepectoral tissue expander, acellular dermis reconstruction.   FINDINGS: Full incorporation ADM noted bilateral. Natrelle Inspira Smooth Round Extra Projection 560 ml implants placed bilateral. REF SRx-[id number] SN RIGHT [id number] LEFT SN [id number]  DESCRIPTION OF PROCEDURE:  The patient's operative site was marked with the patient in the preoperative area. The patient was taken to the operating room. SCDs were placed and IV antibiotics were given. The patient's operative site was prepped and draped in a sterile fashion. A time out was performed and all information was confirmed to be correct. Patient was noted to have asymmetry reconstructed inframammary folds with right lower.  Incision made in right mastectomy scar and carried through acellular dermis to implant cavity. Expander removed. Thermal capsulorraphy performed over inframammary fold. Interrupted 2-0 PDS suture used from ADM to chest wall to set inframammary fold on right symmetric with left. Capsulotomies performed superiorly and medially to accommodate the implant. Sizer placed. I then directed attention to left chest. Similar incision made and expander removed. Superior pole capsulotomies performed. Sizer placed. Patient brought to upright sitting position. Natrelle Inspira Extra Projection 560 ml implants selected bilateral. Patient returned  to supine position. Cavity irrigated with saline solution containing Ancef, gentamicin, and bacitracin. Hemostasis ensured. Cavity irrigated with Betadine. Implant placed in cavity, orientation ensured. Closure completed with 3-0 vicryl in acellular dermis and superficial fascia. 4-0 vicryl used to closed dermis followed by 4-0 monocryl subcuticular closure. Over right chest, similar irrigation completed. Implant placed in cavity, orientation ensured. Closure completed with 3-0 vicryl in acellular dermis and superficial fascia. 4-0 vicryl used to closed dermis followed by 4-0 monocryl subcuticular closure. Dermabond applied to incisions followed by dry dressing, breast binder.  The patient was allowed to wake from anesthesia, extubated and taken to the recovery room in satisfactory condition.   SPECIMENS: none  DRAINS: none  Irene Limbo, MD [facility] Plastic & Reconstructive Surgery [phone], pin [phone]

## 2017-07-16 NOTE — Transfer of Care (Signed)
Immediate Anesthesia Transfer of Care Note  Patient: Amanda Williams  Procedure(s) Performed: BILATERAL REMOVAL OF TISSUE EXPANDER AND PLACEMENT OF SILICONE IMPLANT (Bilateral Breast)  Patient Location: PACU  Anesthesia Type:General  Level of Consciousness: drowsy and patient cooperative  Airway & Oxygen Therapy: Patient Spontanous Breathing and Patient connected to face mask oxygen  Post-op Assessment: Report given to RN, Post -op Vital signs reviewed and stable and Patient moving all extremities X 4  Post vital signs: Reviewed and stable  Last Vitals:  Vitals Value Taken Time  BP    Temp    Pulse    Resp    SpO2      Last Pain:  Vitals:   07/16/17 0652  TempSrc: Oral  PainSc:          Complications: No apparent anesthesia complications

## 2017-07-16 NOTE — Anesthesia Postprocedure Evaluation (Signed)
Anesthesia Post Note  Patient: Amanda Williams  Procedure(s) Performed: BILATERAL REMOVAL OF TISSUE EXPANDER AND PLACEMENT OF SILICONE IMPLANT (Bilateral Breast)     Patient location during evaluation: PACU Anesthesia Type: General Level of consciousness: awake Pain management: pain level controlled Vital Signs Assessment: post-procedure vital signs reviewed and stable Respiratory status: spontaneous breathing Cardiovascular status: stable Postop Assessment: no apparent nausea or vomiting Anesthetic complications: no    Last Vitals:  Vitals:   07/16/17 1056 07/16/17 1114  BP: (!) 112/46 129/76  Pulse: 69 75  Resp: 13 14  Temp:    SpO2: 100% 100%    Last Pain:  Vitals:   07/16/17 1055  TempSrc:   PainSc: 2    Pain Goal:                 Laverne Hursey JR,JOHN Joyia Riehle

## 2017-07-16 NOTE — Anesthesia Procedure Notes (Signed)
Procedure Name: Intubation Date/Time: 07/16/2017 8:51 AM Performed by: Freddie Breech, CRNA Pre-anesthesia Checklist: Patient identified, Emergency Drugs available, Suction available and Patient being monitored Patient Re-evaluated:Patient Re-evaluated prior to induction Oxygen Delivery Method: Circle System Utilized Preoxygenation: Pre-oxygenation with 100% oxygen Induction Type: IV induction Ventilation: Mask ventilation without difficulty and Two handed mask ventilation required Laryngoscope Size: Glidescope and 3 Grade View: Grade II Tube type: Oral Tube size: 7.0 mm Number of attempts: 1 Airway Equipment and Method: Stylet and Video-laryngoscopy Placement Confirmation: positive ETCO2 and breath sounds checked- equal and bilateral (ETT placement confirmed through open vocal cords by video laryngoscopy.  ) Secured at: 21 cm Tube secured with: Tape Dental Injury: Teeth and Oropharynx as per pre-operative assessment

## 2017-07-16 NOTE — Op Note (Signed)
First Assist Op Note: [facility] I assisted the Surgeon(s) _____Dr. Arnoldo Hooker Thimmappa__ on the procedure(s): ____Removal of bilateral breast tissue expanders and placement of bilateral silicone implants____on Date ___4/26/19______  I provided my assistance on this case as follows:  I was present and acted as first Environmental consultant during this operation. I was present during the patient transport into the operative suite and assisted the OR staff with transferring and positioning of the patient. All extremities were checked and properly cushioned and safety straps in place. I was involved in the prepping and placement of sterile drapes. A time out was performed and all information confirmed to be correct.  I first assisted during the case including retraction for exposure, assisting with closure of surgical wounds and application of sterile dressings. I provided assistance with application of post operative garments/splinting and assisted with patient transfer back to the stretcher as needed.   Luismiguel Lamere,PA-C Plastic Surgery [phone]

## 2017-07-19 ENCOUNTER — Encounter (HOSPITAL_COMMUNITY): Payer: Self-pay | Admitting: Plastic Surgery

## 2017-07-21 ENCOUNTER — Other Ambulatory Visit: Payer: 59

## 2017-07-21 ENCOUNTER — Inpatient Hospital Stay: Payer: 59 | Attending: Hematology and Oncology

## 2017-07-21 ENCOUNTER — Ambulatory Visit: Payer: 59 | Admitting: Hematology and Oncology

## 2017-07-21 VITALS — BP 99/62 | HR 77 | Temp 98.6°F | Resp 16

## 2017-07-21 DIAGNOSIS — C50012 Malignant neoplasm of nipple and areola, left female breast: Secondary | ICD-10-CM

## 2017-07-21 DIAGNOSIS — Z170 Estrogen receptor positive status [ER+]: Secondary | ICD-10-CM | POA: Insufficient documentation

## 2017-07-21 DIAGNOSIS — C50011 Malignant neoplasm of nipple and areola, right female breast: Secondary | ICD-10-CM

## 2017-07-21 DIAGNOSIS — C50812 Malignant neoplasm of overlapping 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 breast: Secondary | ICD-10-CM | POA: Diagnosis not present

## 2017-07-21 DIAGNOSIS — Z9012 Acquired absence of left breast and nipple: Secondary | ICD-10-CM | POA: Insufficient documentation

## 2017-07-21 DIAGNOSIS — Z5112 Encounter for antineoplastic immunotherapy: Secondary | ICD-10-CM | POA: Diagnosis not present

## 2017-07-21 DIAGNOSIS — Z79899 Other long term (current) drug therapy: Secondary | ICD-10-CM | POA: Insufficient documentation

## 2017-07-21 DIAGNOSIS — Z9011 Acquired absence of right breast and nipple: Secondary | ICD-10-CM | POA: Insufficient documentation

## 2017-07-21 MED ORDER — ACETAMINOPHEN 325 MG PO TABS
ORAL_TABLET | ORAL | Status: AC
Start: 2017-07-21 — End: ?
  Filled 2017-07-21: qty 2

## 2017-07-21 MED ORDER — SODIUM CHLORIDE 0.9% FLUSH
10.0000 mL | INTRAVENOUS | Status: DC | PRN
  Administered 2017-07-21: 10 mL
  Filled 2017-07-21: qty 10

## 2017-07-21 MED ORDER — HEPARIN SOD (PORK) LOCK FLUSH 100 UNIT/ML IV SOLN
500.0000 [IU] | Freq: Once | INTRAVENOUS | Status: AC | PRN
  Administered 2017-07-21: 500 [IU]
  Filled 2017-07-21: qty 5

## 2017-07-21 MED ORDER — DIPHENHYDRAMINE HCL 25 MG PO CAPS
50.0000 mg | ORAL_CAPSULE | Freq: Once | ORAL | Status: AC
  Administered 2017-07-21: 25 mg via ORAL

## 2017-07-21 MED ORDER — DIPHENHYDRAMINE HCL 25 MG PO CAPS
ORAL_CAPSULE | ORAL | Status: AC
  Filled 2017-07-21: qty 1

## 2017-07-21 MED ORDER — SODIUM CHLORIDE 0.9 % IV SOLN
Freq: Once | INTRAVENOUS | Status: AC
  Administered 2017-07-21: 10:00:00 via INTRAVENOUS

## 2017-07-21 MED ORDER — ACETAMINOPHEN 325 MG PO TABS
650.0000 mg | ORAL_TABLET | Freq: Once | ORAL | Status: AC
  Administered 2017-07-21: 650 mg via ORAL

## 2017-07-21 MED ORDER — TRASTUZUMAB CHEMO 150 MG IV SOLR
6.0000 mg/kg | Freq: Once | INTRAVENOUS | Status: AC
  Administered 2017-07-21: 399 mg via INTRAVENOUS
  Filled 2017-07-21: qty 19

## 2017-07-21 NOTE — Patient Instructions (Signed)
[facility] Discharge Instructions for Patients Receiving Chemotherapy  Today you received the following chemotherapy agents herceptin   To help prevent nausea and vomiting after your treatment, we encourage you to take your nausea medication as directed   If you develop nausea and vomiting that is not controlled by your nausea medication, call the clinic.   BELOW ARE SYMPTOMS THAT SHOULD BE REPORTED IMMEDIATELY:  *FEVER GREATER THAN 100.5 F  *CHILLS WITH OR WITHOUT FEVER  NAUSEA AND VOMITING THAT IS NOT CONTROLLED WITH YOUR NAUSEA MEDICATION  *UNUSUAL SHORTNESS OF BREATH  *UNUSUAL BRUISING OR BLEEDING  TENDERNESS IN MOUTH AND THROAT WITH OR WITHOUT PRESENCE OF ULCERS  *URINARY PROBLEMS  *BOWEL PROBLEMS  UNUSUAL RASH Items with * indicate a potential emergency and should be followed up as soon as possible.  Feel free to call the clinic you have any questions or concerns. The clinic phone number is [phone].  

## 2017-08-03 ENCOUNTER — Ambulatory Visit
Admission: RE | Admit: 2017-08-03 | Discharge: 2017-08-03 | Disposition: A | Discharge status: Home / Self Care | Payer: 59 | Source: Ambulatory Visit | Attending: Radiation Oncology | Admitting: Radiation Oncology

## 2017-08-03 DIAGNOSIS — Z170 Estrogen receptor positive status [ER+]: Secondary | ICD-10-CM | POA: Diagnosis not present

## 2017-08-03 DIAGNOSIS — Z510 Encounter for antineoplastic radiation therapy: Secondary | ICD-10-CM | POA: Insufficient documentation

## 2017-08-03 DIAGNOSIS — C50812 Malignant neoplasm of overlapping 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 breast: Secondary | ICD-10-CM | POA: Diagnosis not present

## 2017-08-03 NOTE — Progress Notes (Signed)
  Radiation Oncology         [phone]) [phone] ________________________________  Name: Amanda Williams MRN: [id number]  Date: 08/03/2017  DOB: 1965/11/23  Optical Surface Tracking Plan:  Since intensity modulated radiotherapy (IMRT) and 3D conformal radiation treatment methods are predicated on accurate and precise positioning for treatment, intrafraction motion monitoring is medically necessary to ensure accurate and safe treatment delivery.  The ability to quantify intrafraction motion without excessive ionizing radiation dose can only be performed with optical surface tracking. Accordingly, surface imaging offers the opportunity to obtain 3D measurements of patient position throughout IMRT and 3D treatments without excessive radiation exposure.  I am ordering optical surface tracking for this patient's upcoming course of radiotherapy. ________________________________  Kyung Rudd, MD 08/03/2017 6:17 PM    Reference:   Ursula Alert, J, et al. Surface imaging-based analysis of intrafraction motion for breast radiotherapy patients.Journal of [place], n. 6, nov. 2014. ISSN [id number].   Available at: <http://www.jacmp.org/index.php/jacmp/article/view/4957>.

## 2017-08-03 NOTE — Progress Notes (Signed)
  Radiation Oncology         [phone]) [phone] ________________________________  Name: Amanda Williams MRN: [id number]  Date: 08/03/2017  DOB: [birth year]  DIAGNOSIS:     ICD-10-CM   1. Malignant neoplasm of overlapping sites of left breast in female, estrogen receptor positive ([place]) C50.812    Z17.0      SIMULATION AND TREATMENT PLANNING NOTE  The patient presented for simulation prior to beginning her course of radiation treatment for her diagnosis of right-sided breast cancer. The patient was placed in a supine position on a breast board. A customized vac-lock bag was also constructed and this complex treatment device will be used on a daily basis during her treatment. In this fashion, a CT scan was obtained through the chest area and an isocenter was placed near the chest wall at the upper aspect of the right chest.  The patient will be planned to receive a course of radiation initially to a dose of 50.4 gray. This will consist of a 4 field technique targeting the right chest wall as well as the supraclavicular region. Therefore 2 customized medial and lateral tangent fields have been created targeting the chest wall, and also 2 additional customized fields have been designed to treat the supraclavicular region both with a right supraclavicular field and a right posterior axillary boost field. A forward planning/reduced field technique will also be evaluated to determine if this significantly improves the dose homogeneity of the overall plan. Therefore, additional customized blocks/fields may be necessary.  This initial treatment will be accomplished at 1.8 gray per fraction.   The initial plan will consist of a 3-D conformal technique. The target volume/scar, heart and lungs have been contoured and dose volume histograms of each of these structures will be evaluated as part of the 3-D conformal treatment planning process.   It is anticipated that the patient will then receive a 10 gray boost to the  surgical scar. This will be accomplished at 2 gray per fraction. The final anticipated total dose therefore will correspond to 60.4 gray.    _______________________________   Jodelle Gross, MD, PhD

## 2017-08-06 DIAGNOSIS — C50812 Malignant neoplasm of overlapping 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 breast: Secondary | ICD-10-CM | POA: Diagnosis not present

## 2017-08-09 ENCOUNTER — Ambulatory Visit
Admission: RE | Admit: 2017-08-09 | Discharge: 2017-08-09 | Disposition: A | Discharge status: Home / Self Care | Payer: 59 | Source: Ambulatory Visit | Attending: Radiation Oncology | Admitting: Radiation Oncology

## 2017-08-09 ENCOUNTER — Ambulatory Visit: Payer: 59

## 2017-08-09 DIAGNOSIS — C50811 Malignant neoplasm of overlapping sites of right female breast: Secondary | ICD-10-CM

## 2017-08-09 DIAGNOSIS — C50812 Malignant neoplasm of overlapping 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 breast: Secondary | ICD-10-CM | POA: Diagnosis not present

## 2017-08-09 DIAGNOSIS — Z170 Estrogen receptor positive status [ER+]: Secondary | ICD-10-CM

## 2017-08-09 MED ORDER — RADIAPLEXRX EX GEL
Freq: Once | CUTANEOUS | Status: AC
  Administered 2017-08-09: 10:00:00 via TOPICAL

## 2017-08-10 ENCOUNTER — Ambulatory Visit
Admission: RE | Admit: 2017-08-10 | Discharge: 2017-08-10 | Disposition: A | Discharge status: Home / Self Care | Payer: 59 | Source: Ambulatory Visit | Attending: Radiation Oncology | Admitting: Radiation Oncology

## 2017-08-10 ENCOUNTER — Ambulatory Visit: Payer: 59 | Admitting: Radiation Oncology

## 2017-08-10 DIAGNOSIS — C50812 Malignant neoplasm of overlapping 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 breast: Secondary | ICD-10-CM | POA: Diagnosis not present

## 2017-08-11 ENCOUNTER — Inpatient Hospital Stay (HOSPITAL_BASED_OUTPATIENT_CLINIC_OR_DEPARTMENT_OTHER): Payer: 59 | Admitting: Adult Health

## 2017-08-11 ENCOUNTER — Inpatient Hospital Stay: Payer: 59

## 2017-08-11 ENCOUNTER — Encounter: Payer: Self-pay | Admitting: Adult Health

## 2017-08-11 ENCOUNTER — Telehealth: Payer: Self-pay | Admitting: Adult Health

## 2017-08-11 ENCOUNTER — Ambulatory Visit
Admission: RE | Admit: 2017-08-11 | Discharge: 2017-08-11 | Disposition: A | Discharge status: Home / Self Care | Payer: 59 | Source: Ambulatory Visit | Attending: Radiation Oncology | Admitting: Radiation Oncology

## 2017-08-11 VITALS — BP 98/66 | HR 82 | Temp 98.5°F | Resp 18 | Ht 66.0 in | Wt 151.9 lb

## 2017-08-11 DIAGNOSIS — Z170 Estrogen receptor positive status [ER+]: Secondary | ICD-10-CM

## 2017-08-11 DIAGNOSIS — C50811 Malignant neoplasm of overlapping sites of right female breast: Secondary | ICD-10-CM

## 2017-08-11 DIAGNOSIS — C50012 Malignant neoplasm of nipple and areola, left female breast: Principal | ICD-10-CM

## 2017-08-11 DIAGNOSIS — Z9013 Acquired absence of bilateral breasts and nipples: Secondary | ICD-10-CM | POA: Diagnosis not present

## 2017-08-11 DIAGNOSIS — Z5112 Encounter for antineoplastic immunotherapy: Secondary | ICD-10-CM | POA: Diagnosis not present

## 2017-08-11 DIAGNOSIS — C50812 Malignant neoplasm of overlapping 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 breast: Secondary | ICD-10-CM | POA: Diagnosis not present

## 2017-08-11 DIAGNOSIS — C50011 Malignant neoplasm of nipple and areola, right female breast: Secondary | ICD-10-CM

## 2017-08-11 DIAGNOSIS — Z5111 Encounter for antineoplastic chemotherapy: Secondary | ICD-10-CM

## 2017-08-11 LAB — CMP (CANCER CENTER ONLY)
ALT: 21 U/L (ref 0–55)
AST: 24 U/L (ref 5–34)
Albumin: 3.8 g/dL (ref 3.5–5.0)
Alkaline Phosphatase: 87 U/L (ref 40–150)
Anion gap: 10 (ref 3–11)
BILIRUBIN TOTAL: 0.4 mg/dL (ref 0.2–1.2)
BUN: 12 mg/dL (ref 7–26)
CO2: 25 mmol/L (ref 22–29)
Calcium: 9.2 mg/dL (ref 8.4–10.4)
Chloride: 106 mmol/L (ref 98–109)
Creatinine: 0.89 mg/dL (ref 0.60–1.10)
GFR, Est AFR Am: >60 mL/min (ref >60)
GFR, Estimated: >60 mL/min (ref >60)
Glucose, Bld: 130 mg/dL (ref 70–140)
POTASSIUM: 3.7 mmol/L (ref 3.5–5.1)
Sodium: 141 mmol/L (ref 136–145)
TOTAL PROTEIN: 7.2 g/dL (ref 6.4–8.3)

## 2017-08-11 LAB — CBC WITH DIFFERENTIAL (CANCER CENTER ONLY)
BASOS ABS: 0 10*3/uL (ref 0.0–0.1)
Basophils Relative: 1 %
Eosinophils Absolute: 0.1 10*3/uL (ref 0.0–0.5)
Eosinophils Relative: 3 %
HEMATOCRIT: 32.6 % — AB (ref 34.8–46.6)
Hemoglobin: 10.9 g/dL — ABNORMAL LOW (ref 11.6–15.9)
LYMPHS ABS: 1.5 10*3/uL (ref 0.9–3.3)
LYMPHS PCT: 34 %
MCH: 32.3 pg (ref 25.1–34.0)
MCHC: 33.5 g/dL (ref 31.5–36.0)
MCV: 96.4 fL (ref 79.5–101.0)
MONO ABS: 0.3 10*3/uL (ref 0.1–0.9)
MONOS PCT: 8 %
NEUTROS ABS: 2.5 10*3/uL (ref 1.5–6.5)
Neutrophils Relative %: 54 %
Platelet Count: 253 10*3/uL (ref 145–400)
RBC: 3.39 MIL/uL — ABNORMAL LOW (ref 3.70–5.45)
RDW: 13.7 % (ref 11.2–14.5)
WBC Count: 4.5 10*3/uL (ref 3.9–10.3)

## 2017-08-11 MED ORDER — HEPARIN SOD (PORK) LOCK FLUSH 100 UNIT/ML IV SOLN
500.0000 [IU] | Freq: Once | INTRAVENOUS | Status: AC | PRN
  Administered 2017-08-11: 500 [IU]
  Filled 2017-08-11: qty 5

## 2017-08-11 MED ORDER — SODIUM CHLORIDE 0.9% FLUSH
10.0000 mL | Freq: Once | INTRAVENOUS | Status: AC
  Administered 2017-08-11: 10 mL
  Filled 2017-08-11: qty 10

## 2017-08-11 MED ORDER — SODIUM CHLORIDE 0.9% FLUSH
10.0000 mL | INTRAVENOUS | Status: DC | PRN
  Administered 2017-08-11: 10 mL
  Filled 2017-08-11: qty 10

## 2017-08-11 MED ORDER — DIPHENHYDRAMINE HCL 25 MG PO CAPS
ORAL_CAPSULE | ORAL | Status: AC
  Filled 2017-08-11: qty 1

## 2017-08-11 MED ORDER — ACETAMINOPHEN 325 MG PO TABS
650.0000 mg | ORAL_TABLET | Freq: Once | ORAL | Status: AC
  Administered 2017-08-11: 650 mg via ORAL

## 2017-08-11 MED ORDER — DIPHENHYDRAMINE HCL 25 MG PO CAPS
25.0000 mg | ORAL_CAPSULE | Freq: Once | ORAL | Status: AC
  Administered 2017-08-11: 25 mg via ORAL

## 2017-08-11 MED ORDER — TRASTUZUMAB CHEMO 150 MG IV SOLR
6.0000 mg/kg | Freq: Once | INTRAVENOUS | Status: AC
  Administered 2017-08-11: 399 mg via INTRAVENOUS
  Filled 2017-08-11: qty 19

## 2017-08-11 MED ORDER — ACETAMINOPHEN 325 MG PO TABS
ORAL_TABLET | ORAL | Status: AC
  Filled 2017-08-11: qty 2

## 2017-08-11 MED ORDER — SODIUM CHLORIDE 0.9 % IV SOLN
Freq: Once | INTRAVENOUS | Status: AC
  Administered 2017-08-11: 10:00:00 via INTRAVENOUS

## 2017-08-11 NOTE — Telephone Encounter (Signed)
Per 5/22 no los

## 2017-08-11 NOTE — Progress Notes (Signed)
[facility] Follow up:    Burnis Medin, MD [place] [id number]   DIAGNOSIS: Cancer Staging Malignant neoplasm of overlapping 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 breast [facility]) Staging form: Breast, AJCC 8th Edition - Clinical: cT2, cN0, cM0, G1, ER: Unknown, PR: Unknown, HER2: Unknown - Unsigned - Pathologic: Stage IA (pT2, pN0, cM0, G1, ER: Positive, PR: Positive, HER2: Positive) - Signed by Gardenia Phlegm, NP on 01/27/2017  Malignant neoplasm of overlapping sites of right breast in female, estrogen receptor positive ([place]) Staging form: Breast, AJCC 8th Edition - Clinical stage from 12/23/2016: Stage IIIB (cT4, cN1, cM0, G1, ER: Positive, PR: Positive, HER2: Negative) - Unsigned - Pathologic: Stage IB (pT3, pN1a, cM0, G1, ER: Positive, PR: Positive, HER2: Negative) - Signed by Gardenia Phlegm, NP on 01/27/2017   SUMMARY OF ONCOLOGIC HISTORY:   Malignant neoplasm of overlapping 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 breast ([place])   01/12/2017 Surgery    Left mastectomy: IDC with DCIS, 2 tumors 3.6 cm ER 90%, PR 95%, Ki-67 5%, HER-2 negative ratio 1.32) and 1.2 cm (ER 95% PR 95% positive Her 2 Positive ratio 3.2, Ki-67 5%) T2 N0 stage Ib      02/16/2017 -  Chemotherapy    TCH x6 followed by Herceptin maintenance        Malignant neoplasm of overlapping sites of right breast in female, estrogen receptor positive ([place])   12/21/2016 Initial Diagnosis    Palpable lumps in the right breast with nipple inversion: Mammogram revealed skin thickening and distortion, ultrasound revealed 4 masses 3.1 cm at 11:00, 2.5 cm at 6:00, 4 cm at 8:00, 2.2 cm at 5:00 with 2 abnormal axillary lymph nodes: MRI revealed 7 cm abnormality on right breast, in addition 3.6 cm abnormality in the left breast and 2 additional masses 1 cm each; right breast: T4 N1 stage III B clinical stage; left breast: T2 N0 stage IB clinical stage      12/21/2016 Pathology Results    Right breast:  Grade 1 Invasive lobular cancer, lymph node also positive, ER 70%, PR 90%, Ki-67 10%, HER-2 negative ratio 1.09 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  The Multi-Cancer Panel was ordered. The Multi-Cancer Panel offered by [facility] includes sequencing and/or deletion duplication testing of the following 83 genes: ALK, APC, ATM, AXIN2,BAP1,  BARD1, BLM, BMPR1A, BRCA1, BRCA2, BRIP1, CASR, CDC73, CDH1, CDK4, CDKN1B, CDKN1C, CDKN2A (p14ARF), CDKN2A (p16INK4a), CEBPA, CHEK2, CTNNA1, DICER1, DIS3L2, EGFR (c.2369C>T, p.Thr790Met variant only), EPCAM (Deletion/duplication testing only), FH, FLCN, GATA2, GPC3, GREM1 (Promoter region deletion/duplication testing only), HOXB13 (c.251G>A, p.Gly84Glu), HRAS, KIT, MAX, MEN1, MET, MITF (c.952G>A, p.Glu318Lys variant only), MLH1, MSH2, MSH3, MSH6, MUTYH, NBN, NF1, NF2, NTHL1, PALB2, PDGFRA, PHOX2B, PMS2, POLD1, POLE, POT1, PRKAR1A, PTCH1, PTEN, RAD50, RAD51C, RAD51D, RB1, RECQL4, RET, RUNX1, SDHAF2, SDHA (sequence changes only), SDHB, SDHC, SDHD, SMAD4, SMARCA4, SMARCB1, SMARCE1, STK11, SUFU, TERC, TERT, TMEM127, TP53, TSC1, TSC2, VHL, WRN and WT1.   Results: Negative, no pathogenic variants identified.  The date of this test report is 01/04/2017.        01/12/2017 Surgery    Right mastectomy: Invasive and in situ lobular carcinoma involves the dermis of the skin of the nipple, margins negative, 3/3 lymph nodes positive, grade 1, EF 75%, PR 95%, HER-2 negative  ratio 1.19, Ki-67 10%, T3N1A stage IIa       CURRENT THERAPY: Adjuvant radiation and Herceptin maintenance  INTERVAL HISTORY: Amanda Williams 52 y.o. female returns for evaluation prior to receiving Herceptin.  She is undergoing radiation that started this past Monday.  She is having some itching at her port site from the dressing.   She is looking forward to having the dressing changed once she gets to infusion.  She is also requesting one 30m tablet of benadryl prior to treatments.    Patient Active Problem List   Diagnosis Date Noted  . Bilateral breast cancer ([place] 01/12/2017  . Genetic testing 01/05/2017  . Family history of breast cancer   . Malignant neoplasm of overlapping sites of right breast in female, estrogen receptor positive ([place] 12/23/2016  . Malignant neoplasm of overlapping 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 breast ([facility] 12/18/2016  . Exposure to communicable disease 08/04/2012  . Counseling about travel 08/04/2012  . Throat symptom 04/12/2012  . Visit for preventive health examination 10/18/2010  . Dense breasts 10/18/2010  . Pelvic pressure in female 05/20/2010  . PANIC ATTACK 03/27/2008  . MICROSCOPIC HEMATURIA 03/27/2008  . PALPITATIONS 02/02/2007  . ALLERGIC RHINITIS 10/07/2006    is allergic to penicillins; sulfonamide derivatives; and adhesive [tape].  MEDICAL HISTORY: Past Medical History:  Diagnosis Date  . Allergy   . Breast cancer ([place]   . Difficult intubation    see 01/12/17 anesthesia record  . Family history of breast cancer   . Hematuria    ? trigonitis  . History of kidney stones   . Panic attack    echo 2009 normal    SURGICAL HISTORY: Past Surgical History:  Procedure Laterality Date  . BREAST BIOPSY     left  . BREAST RECONSTRUCTION WITH PLACEMENT OF TISSUE EXPANDER AND FLEX HD (ACELLULAR HYDRATED DERMIS) Bilateral 01/12/2017   Procedure: BREAST RECONSTRUCTION WITH PLACEMENT OF TISSUE EXPANDER AND ALLODERM;  Surgeon: TIrene Limbo MD;  [facility]  Service: Plastics;  Laterality: Bilateral;  . COLONOSCOPY    . MASTECTOMY W/ SENTINEL NODE BIOPSY Bilateral 01/12/2017   Procedure: RIGHT MODIFIED RADICAL MASTECTOMY, LEFT TOTAL MASTECTOMY WITH LEFT SENTINEL LYMPH NODE BIOPSY;  Surgeon: WRolm Bookbinder MD;  [facility]   Service: General;  Laterality: Bilateral;  . PORTACATH PLACEMENT N/A 02/15/2017   Procedure: INSERTION PORT-A-CATH WITH [place]  Surgeon: WRolm Bookbinder MD;  [facility]  Service: General;  Laterality: N/A;  . REMOVAL OF TISSUE EXPANDER AND PLACEMENT OF IMPLANT Bilateral 07/16/2017   Procedure: BILATERAL REMOVAL OF TISSUE EXPANDER AND PLACEMENT OF SILICONE IMPLANT;  Surgeon: TIrene Limbo MD;  [facility]  Service: Plastics;  Laterality: Bilateral;    SOCIAL HISTORY: Social History   Socioeconomic History  . Marital status: Married    Spouse name: Not on file  . Number of children: Not on file  . Years of education: Not on file  . Highest education level: Not on file  Occupational History  . Not on file  Social Needs  . Financial resource strain: Not on file  . Food insecurity:    Worry: Not on file    Inability: Not on file  . Transportation needs:    Medical: Not on file    Non-medical: Not on file  Tobacco Use  . Smoking status: Never Smoker  . Smokeless tobacco: Never Used  Substance and Sexual Activity  . Alcohol use: Yes    Alcohol/week: 2.4 oz  Types: 4 Glasses of wine per week    Comment: ocassionally  . Drug use: No  . Sexual activity: Not on file    Comment: Husband has had vasectomy  Lifestyle  . Physical activity:    Days per week: Not on file    Minutes per session: Not on file  . Stress: Not on file  Relationships  . Social connections:    Talks on phone: Not on file    Gets together: Not on file    Attends religious service: Not on file    Active member of club or organization: Not on file    Attends meetings of clubs or organizations: Not on file    Relationship status: Not on file  . Intimate partner violence:    Fear of current or ex partner: Not on file    Emotionally abused: Not on file    Physically abused: Not on file    Forced sexual activity: Not on file  Other Topics Concern  . Not on file  Social History Narrative    hhof  4    Pets dog and cat.   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    Has smoke detector and wears seat belts.  No firearms. No excess sun exposure. Sees dentist regularly .           FAMILY HISTORY: Family History  Problem Relation Age of Onset  . Breast cancer Maternal Grandmother 87    Review of Systems  Constitutional: Negative for appetite change, chills, fatigue, fever and unexpected weight change.  HENT:   Negative for hearing loss, lump/mass, sore throat and trouble swallowing.   Eyes: Negative for eye problems and icterus.  Respiratory: Negative for chest tightness, cough and shortness of breath.   Cardiovascular: Negative for chest pain, leg swelling and palpitations.  Gastrointestinal: Negative for abdominal distention, abdominal pain, constipation, diarrhea, nausea and vomiting.  Endocrine: Negative for hot flashes.  Neurological: Negative for dizziness, extremity weakness and numbness.  Hematological: Negative for adenopathy. Does not bruise/bleed easily.  Psychiatric/Behavioral: Negative for depression. The patient is not nervous/anxious.       PHYSICAL EXAMINATION  ECOG PERFORMANCE STATUS: 1 - Symptomatic but completely ambulatory  Vitals:   08/11/17 0906  BP: 98/66  Pulse: 82  Resp: 18  Temp: 98.5 F (36.9 C)  SpO2: 98%    Physical Exam  Constitutional: She is oriented to person, place, and time. She appears well-developed and well-nourished.  HENT:  Head: Normocephalic and atraumatic.  Mouth/Throat: Oropharynx is clear and moist. No oropharyngeal exudate.  Eyes: Pupils are equal, round, and reactive to light. No scleral icterus.  Neck: Neck supple.  Cardiovascular: Normal rate, regular rhythm, normal heart sounds and intact distal pulses.  Pulmonary/Chest: Effort normal and breath sounds normal. No stridor. No respiratory distress. She has no wheezes. She has no rales.  Abdominal: Soft. Bowel sounds are  normal. She exhibits no distension and no mass. There is no tenderness. There is no rebound and no guarding.  Musculoskeletal: She exhibits no edema.  Lymphadenopathy:    She has no cervical adenopathy.  Neurological: She is alert and oriented to person, place, and time.  Skin: Skin is warm and dry. No rash noted.  Psychiatric: She has a normal mood and affect.    LABORATORY DATA:  CBC    Component Value Date/Time  WBC 4.5 08/11/2017 0822   WBC 3.5 (L) 07/13/2017 0930   RBC 3.39 (L) 08/11/2017 0822   HGB 10.9 (L) 08/11/2017 0822   HGB 10.0 (L) 03/17/2017 1008   HCT 32.6 (L) 08/11/2017 0822   HCT 31.2 (L) 03/17/2017 1008   PLT 253 08/11/2017 0822   PLT 245 03/17/2017 1008   MCV 96.4 08/11/2017 0822   MCV 89.7 03/17/2017 1008   MCH 32.3 08/11/2017 0822   MCHC 33.5 08/11/2017 0822   RDW 13.7 08/11/2017 0822   RDW 13.7 03/17/2017 1008   LYMPHSABS 1.5 08/11/2017 0822   LYMPHSABS 0.7 (L) 03/17/2017 1008   MONOABS 0.3 08/11/2017 0822   MONOABS 0.1 03/17/2017 1008   EOSABS 0.1 08/11/2017 0822   EOSABS 0.0 03/17/2017 1008   BASOSABS 0.0 08/11/2017 0822   BASOSABS 0.0 03/17/2017 1008    CMP     Component Value Date/Time   NA 141 08/11/2017 0822   NA 140 03/17/2017 1008   K 3.7 08/11/2017 0822   K 4.0 03/17/2017 1008   CL 106 08/11/2017 0822   CO2 25 08/11/2017 0822   CO2 22 03/17/2017 1008   GLUCOSE 130 08/11/2017 0822   GLUCOSE 172 (H) 03/17/2017 1008   BUN 12 08/11/2017 0822   BUN 13.0 03/17/2017 1008   CREATININE 0.89 08/11/2017 0822   CREATININE 0.9 03/17/2017 1008   CALCIUM 9.2 08/11/2017 0822   CALCIUM 8.5 03/17/2017 1008   PROT 7.2 08/11/2017 0822   PROT 6.9 03/17/2017 1008   ALBUMIN 3.8 08/11/2017 0822   ALBUMIN 3.6 03/17/2017 1008   AST 24 08/11/2017 0822   AST 23 03/17/2017 1008   ALT 21 08/11/2017 0822   ALT 20 03/17/2017 1008   ALKPHOS 87 08/11/2017 0822   ALKPHOS 70 03/17/2017 1008   BILITOT 0.4 08/11/2017 0822   BILITOT 0.34 03/17/2017 1008    GFRNONAA >60 08/11/2017 0822   GFRAA >60 08/11/2017 4132       ASSESSMENT and PLAN:   Malignant neoplasm of overlapping 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 breast ([place]) 01/12/2017:Left mastectomy: IDC with DCIS, 2 tumors 3.6 cm ER 90%, PR 95%, Ki-67 5%, HER-2 negative ratio 1.32) and 1.2 cm (ER 95% PR 95% positive Her 2 Positive ratio 3.2, Ki-67 5%) T2 N0 stage Ib Right mastectomy: Invasive and in situ lobular carcinoma involves the dermis of the skin of the nipple, margins negative, 3/3 lymph nodes positive, grade 1, EF 75%, PR 95%, HER-2 negative ratio 1.19, Ki-67 10%, T3N1A stage IIa  Treatment plan: 1. Adjuvant chemotherapy with TCH x 4 cycles completed on 06/09/2017 (Taxotere held from final 2 cycles) followed by Herceptin maintenance for 1 year 2. Adjuvant radiation therapy started 08/10/2017 3. Followed by adjuvant antiestrogen therapy __________________________________________________________________________________________ Echo on 06/16/2017: 55% - 60% Current treatment: Herceptin maintenance and radiation  Amanda Williams is doing well today.  She is tolerating radiaiton and Herceptin well.  She will continue this as scheduled.  She will repeat her echocardiogram within the next month and I have ordered this.  She verbalizes understanding.  Initially, she had not wanted benadryl prior to the Herceptin, however today with the itching from the dressing, she wants one benadryl, which I will put in for her.  She will need a sensitive skin port dressing from now on.    We will see Amanda Williams back in 3 weeks for Herceptin and in 6 weeks for labs, f/u with Dr. Lindi Adie, and Herceptin.      Orders Placed This Encounter  Procedures  . ECHOCARDIOGRAM  COMPLETE    Standing Status:   Future    Standing Expiration Date:   11/12/2018    Order Specific Question:   Where should this test be performed    Answer:   [facility]    Order Specific Question:   Perflutren DEFINITY (image enhancing agent) should be  administered unless hypersensitivity or allergy exist    Answer:   Administer Perflutren    Order Specific Question:   Expected Date:    Answer:   1 month    All questions were answered. The patient knows to call the clinic with any problems, questions or concerns. We can certainly see the patient much sooner if necessary.  A total of (30) minutes of face-to-face time was spent with this patient with greater than 50% of that time in counseling and care-coordination.  This note was electronically signed. Scot Dock, NP 08/11/2017

## 2017-08-11 NOTE — Assessment & Plan Note (Addendum)
01/12/2017:Left mastectomy: IDC with DCIS, 2 tumors 3.6 cm ER 90%, PR 95%, Ki-67 5%, HER-2 negative ratio 1.32) and 1.2 cm (ER 95% PR 95% positive Her 2 Positive ratio 3.2, Ki-67 5%) T2 N0 stage Ib Right mastectomy: Invasive and in situ lobular carcinoma involves the dermis of the skin of the nipple, margins negative, 3/3 lymph nodes positive, grade 1, EF 75%, PR 95%, HER-2 negative ratio 1.19, Ki-67 10%, T3N1A stage IIa  Treatment plan: 1. Adjuvant chemotherapy with TCH x 4 cycles completed on 06/09/2017 (Taxotere held from final 2 cycles) followed by Herceptin maintenance for 1 year 2. Adjuvant radiation therapy started 08/10/2017 3. Followed by adjuvant antiestrogen therapy __________________________________________________________________________________________ Echo on 06/16/2017: 55% - 60% Current treatment: Herceptin maintenance and radiation  Amanda Williams is doing well today.  She is tolerating radiaiton and Herceptin well.  She will continue this as scheduled.  She will repeat her echocardiogram within the next month and I have ordered this.  She verbalizes understanding.  Initially, she had not wanted benadryl prior to the Herceptin, however today with the itching from the dressing, she wants one benadryl, which I will put in for her.  She will need a sensitive skin port dressing from now on.    We will see Amanda Williams back in 3 weeks for Herceptin and in 6 weeks for labs, f/u with Dr. Lindi Adie, and Herceptin.

## 2017-08-11 NOTE — Patient Instructions (Signed)
[facility] Discharge Instructions for Patients Receiving Chemotherapy  Today you received the following chemotherapy agents Herceptin  To help prevent nausea and vomiting after your treatment, we encourage you to take your nausea medication as directed   If you develop nausea and vomiting that is not controlled by your nausea medication, call the clinic.   BELOW ARE SYMPTOMS THAT SHOULD BE REPORTED IMMEDIATELY:  *FEVER GREATER THAN 100.5 F  *CHILLS WITH OR WITHOUT FEVER  NAUSEA AND VOMITING THAT IS NOT CONTROLLED WITH YOUR NAUSEA MEDICATION  *UNUSUAL SHORTNESS OF BREATH  *UNUSUAL BRUISING OR BLEEDING  TENDERNESS IN MOUTH AND THROAT WITH OR WITHOUT PRESENCE OF ULCERS  *URINARY PROBLEMS  *BOWEL PROBLEMS  UNUSUAL RASH Items with * indicate a potential emergency and should be followed up as soon as possible.  Feel free to call the clinic should you have any questions or concerns. The clinic phone number is [phone].  Please show the CHEMO ALERT CARD at check-in to the Emergency Department and triage nurse.   

## 2017-08-12 ENCOUNTER — Ambulatory Visit
Admission: RE | Admit: 2017-08-12 | Discharge: 2017-08-12 | Disposition: A | Discharge status: Home / Self Care | Payer: 59 | Source: Ambulatory Visit | Attending: Radiation Oncology | Admitting: Radiation Oncology

## 2017-08-12 DIAGNOSIS — C50812 Malignant neoplasm of overlapping 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 breast: Secondary | ICD-10-CM | POA: Diagnosis not present

## 2017-08-13 ENCOUNTER — Ambulatory Visit
Admission: RE | Admit: 2017-08-13 | Discharge: 2017-08-13 | Disposition: A | Discharge status: Home / Self Care | Payer: 59 | Source: Ambulatory Visit | Attending: Radiation Oncology | Admitting: Radiation Oncology

## 2017-08-13 DIAGNOSIS — C50812 Malignant neoplasm of overlapping 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 breast: Secondary | ICD-10-CM | POA: Diagnosis not present

## 2017-08-17 ENCOUNTER — Ambulatory Visit
Admission: RE | Admit: 2017-08-17 | Discharge: 2017-08-17 | Disposition: A | Discharge status: Home / Self Care | Payer: 59 | Source: Ambulatory Visit | Attending: Radiation Oncology | Admitting: Radiation Oncology

## 2017-08-17 DIAGNOSIS — C50812 Malignant neoplasm of overlapping 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 breast: Secondary | ICD-10-CM | POA: Diagnosis not present

## 2017-08-18 ENCOUNTER — Ambulatory Visit
Admission: RE | Admit: 2017-08-18 | Discharge: 2017-08-18 | Disposition: A | Discharge status: Home / Self Care | Payer: 59 | Source: Ambulatory Visit | Attending: Radiation Oncology | Admitting: Radiation Oncology

## 2017-08-18 DIAGNOSIS — C50812 Malignant neoplasm of overlapping 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 breast: Secondary | ICD-10-CM | POA: Diagnosis not present

## 2017-08-19 ENCOUNTER — Ambulatory Visit
Admission: RE | Admit: 2017-08-19 | Discharge: 2017-08-19 | Disposition: A | Discharge status: Home / Self Care | Payer: 59 | Source: Ambulatory Visit | Attending: Radiation Oncology | Admitting: Radiation Oncology

## 2017-08-19 DIAGNOSIS — C50812 Malignant neoplasm of overlapping 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 breast: Secondary | ICD-10-CM | POA: Diagnosis not present

## 2017-08-20 ENCOUNTER — Ambulatory Visit
Admission: RE | Admit: 2017-08-20 | Discharge: 2017-08-20 | Disposition: A | Discharge status: Home / Self Care | Payer: 59 | Source: Ambulatory Visit | Attending: Radiation Oncology | Admitting: Radiation Oncology

## 2017-08-20 DIAGNOSIS — C50812 Malignant neoplasm of overlapping 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 breast: Secondary | ICD-10-CM | POA: Diagnosis not present

## 2017-08-23 ENCOUNTER — Ambulatory Visit
Admission: RE | Admit: 2017-08-23 | Discharge: 2017-08-23 | Disposition: A | Discharge status: Home / Self Care | Payer: 59 | Source: Ambulatory Visit | Attending: Radiation Oncology | Admitting: Radiation Oncology

## 2017-08-23 DIAGNOSIS — Z170 Estrogen receptor positive status [ER+]: Secondary | ICD-10-CM | POA: Diagnosis not present

## 2017-08-23 DIAGNOSIS — C50812 Malignant neoplasm of overlapping 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 breast: Secondary | ICD-10-CM | POA: Diagnosis present

## 2017-08-23 DIAGNOSIS — Z510 Encounter for antineoplastic radiation therapy: Secondary | ICD-10-CM | POA: Insufficient documentation

## 2017-08-24 ENCOUNTER — Ambulatory Visit
Admission: RE | Admit: 2017-08-24 | Discharge: 2017-08-24 | Disposition: A | Discharge status: Home / Self Care | Payer: 59 | Source: Ambulatory Visit | Attending: Radiation Oncology | Admitting: Radiation Oncology

## 2017-08-24 DIAGNOSIS — Z510 Encounter for antineoplastic radiation therapy: Secondary | ICD-10-CM | POA: Diagnosis not present

## 2017-08-25 ENCOUNTER — Ambulatory Visit
Admission: RE | Admit: 2017-08-25 | Discharge: 2017-08-25 | Disposition: A | Discharge status: Home / Self Care | Payer: 59 | Source: Ambulatory Visit | Attending: Radiation Oncology | Admitting: Radiation Oncology

## 2017-08-25 DIAGNOSIS — Z510 Encounter for antineoplastic radiation therapy: Secondary | ICD-10-CM | POA: Diagnosis not present

## 2017-08-26 ENCOUNTER — Ambulatory Visit
Admission: RE | Admit: 2017-08-26 | Discharge: 2017-08-26 | Disposition: A | Discharge status: Home / Self Care | Payer: 59 | Source: Ambulatory Visit | Attending: Radiation Oncology | Admitting: Radiation Oncology

## 2017-08-26 DIAGNOSIS — Z510 Encounter for antineoplastic radiation therapy: Secondary | ICD-10-CM | POA: Diagnosis not present

## 2017-08-27 ENCOUNTER — Ambulatory Visit
Admission: RE | Admit: 2017-08-27 | Discharge: 2017-08-27 | Disposition: A | Discharge status: Home / Self Care | Payer: 59 | Source: Ambulatory Visit | Attending: Radiation Oncology | Admitting: Radiation Oncology

## 2017-08-27 DIAGNOSIS — Z170 Estrogen receptor positive status [ER+]: Secondary | ICD-10-CM

## 2017-08-27 DIAGNOSIS — C50812 Malignant neoplasm of overlapping 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 breast: Secondary | ICD-10-CM

## 2017-08-27 DIAGNOSIS — Z510 Encounter for antineoplastic radiation therapy: Secondary | ICD-10-CM | POA: Diagnosis not present

## 2017-08-27 MED ORDER — RADIAPLEXRX EX GEL
Freq: Once | CUTANEOUS | Status: AC
  Administered 2017-08-27: 15:00:00 via TOPICAL

## 2017-08-30 ENCOUNTER — Ambulatory Visit
Admission: RE | Admit: 2017-08-30 | Discharge: 2017-08-30 | Disposition: A | Discharge status: Home / Self Care | Payer: 59 | Source: Ambulatory Visit | Attending: Radiation Oncology | Admitting: Radiation Oncology

## 2017-08-30 DIAGNOSIS — Z510 Encounter for antineoplastic radiation therapy: Secondary | ICD-10-CM | POA: Diagnosis not present

## 2017-08-31 ENCOUNTER — Telehealth: Payer: Self-pay | Admitting: *Deleted

## 2017-08-31 ENCOUNTER — Ambulatory Visit
Admission: RE | Admit: 2017-08-31 | Discharge: 2017-08-31 | Disposition: A | Discharge status: Home / Self Care | Payer: 59 | Source: Ambulatory Visit | Attending: Radiation Oncology | Admitting: Radiation Oncology

## 2017-08-31 ENCOUNTER — Other Ambulatory Visit: Payer: Self-pay | Admitting: *Deleted

## 2017-08-31 ENCOUNTER — Other Ambulatory Visit: Payer: Self-pay | Admitting: Radiation Oncology

## 2017-08-31 DIAGNOSIS — Z510 Encounter for antineoplastic radiation therapy: Secondary | ICD-10-CM | POA: Diagnosis not present

## 2017-08-31 DIAGNOSIS — C50812 Malignant neoplasm of overlapping 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 breast: Secondary | ICD-10-CM

## 2017-08-31 DIAGNOSIS — Z170 Estrogen receptor positive status [ER+]: Secondary | ICD-10-CM

## 2017-08-31 LAB — URINALYSIS, COMPLETE (UACMP) WITH MICROSCOPIC
Bilirubin Urine: NEGATIVE
GLUCOSE, UA: NEGATIVE mg/dL
KETONES UR: NEGATIVE mg/dL
Nitrite: NEGATIVE
PH: 6 (ref 5.0–8.0)
Protein, ur: NEGATIVE mg/dL
Specific Gravity, Urine: 1.004 — ABNORMAL LOW (ref 1.005–1.030)

## 2017-08-31 MED ORDER — CIPROFLOXACIN HCL 500 MG PO TABS: 500.0000 mg | ORAL_TABLET | Freq: Two times a day (BID) | ORAL | 0 refills | Status: AC

## 2017-08-31 NOTE — Telephone Encounter (Signed)
Called the patient to give her the results of her urinalysis.  I let her know that [facility]  I also let her know that a culture was also done and that takes a couple of days to see if anything grows.  I let her know that we would keep an eye on the culture to ensure that she is on the correct antibiotic.  Will continue to follow as necessary.  Cori Razor, RN

## 2017-09-01 ENCOUNTER — Ambulatory Visit
Admission: RE | Admit: 2017-09-01 | Discharge: 2017-09-01 | Disposition: A | Discharge status: Home / Self Care | Payer: 59 | Source: Ambulatory Visit | Attending: Radiation Oncology | Admitting: Radiation Oncology

## 2017-09-01 ENCOUNTER — Inpatient Hospital Stay: Payer: 59 | Attending: Hematology and Oncology

## 2017-09-01 VITALS — BP 94/74 | HR 81 | Temp 98.6°F | Resp 17

## 2017-09-01 DIAGNOSIS — C50811 Malignant neoplasm of overlapping sites of right female breast: Secondary | ICD-10-CM | POA: Insufficient documentation

## 2017-09-01 DIAGNOSIS — Z5112 Encounter for antineoplastic immunotherapy: Secondary | ICD-10-CM | POA: Diagnosis not present

## 2017-09-01 DIAGNOSIS — Z170 Estrogen receptor positive status [ER+]: Secondary | ICD-10-CM | POA: Diagnosis not present

## 2017-09-01 DIAGNOSIS — Z9013 Acquired absence of bilateral breasts and nipples: Secondary | ICD-10-CM | POA: Diagnosis not present

## 2017-09-01 DIAGNOSIS — Z510 Encounter for antineoplastic radiation therapy: Secondary | ICD-10-CM | POA: Diagnosis not present

## 2017-09-01 DIAGNOSIS — C50011 Malignant neoplasm of nipple and areola, right female breast: Secondary | ICD-10-CM

## 2017-09-01 DIAGNOSIS — C50812 Malignant neoplasm of overlapping 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 breast: Secondary | ICD-10-CM | POA: Insufficient documentation

## 2017-09-01 DIAGNOSIS — C50012 Malignant neoplasm of nipple and areola, left female breast: Secondary | ICD-10-CM

## 2017-09-01 MED ORDER — TRASTUZUMAB CHEMO 150 MG IV SOLR
6.0000 mg/kg | Freq: Once | INTRAVENOUS | Status: AC
  Administered 2017-09-01: 399 mg via INTRAVENOUS
  Filled 2017-09-01: qty 19

## 2017-09-01 MED ORDER — SODIUM CHLORIDE 0.9 % IV SOLN
Freq: Once | INTRAVENOUS | Status: AC
  Administered 2017-09-01: 09:00:00 via INTRAVENOUS

## 2017-09-01 MED ORDER — DIPHENHYDRAMINE HCL 25 MG PO CAPS
25.0000 mg | ORAL_CAPSULE | Freq: Once | ORAL | Status: AC
  Administered 2017-09-01: 25 mg via ORAL

## 2017-09-01 MED ORDER — HEPARIN SOD (PORK) LOCK FLUSH 100 UNIT/ML IV SOLN
500.0000 [IU] | Freq: Once | INTRAVENOUS | Status: AC | PRN
  Administered 2017-09-01: 500 [IU]
  Filled 2017-09-01: qty 5

## 2017-09-01 MED ORDER — ACETAMINOPHEN 325 MG PO TABS
650.0000 mg | ORAL_TABLET | Freq: Once | ORAL | Status: AC
  Administered 2017-09-01: 650 mg via ORAL

## 2017-09-01 MED ORDER — ACETAMINOPHEN 325 MG PO TABS
ORAL_TABLET | ORAL | Status: AC
  Filled 2017-09-01: qty 2

## 2017-09-01 MED ORDER — SODIUM CHLORIDE 0.9% FLUSH
10.0000 mL | INTRAVENOUS | Status: DC | PRN
  Administered 2017-09-01: 10 mL
  Filled 2017-09-01: qty 10

## 2017-09-01 MED ORDER — DIPHENHYDRAMINE HCL 25 MG PO CAPS
ORAL_CAPSULE | ORAL | Status: AC
  Filled 2017-09-01: qty 1

## 2017-09-01 NOTE — Patient Instructions (Signed)
[facility] Discharge Instructions for Patients Receiving Chemotherapy  Today you received the following chemotherapy agents Herceptin  To help prevent nausea and vomiting after your treatment, we encourage you to take your nausea medication as directed   If you develop nausea and vomiting that is not controlled by your nausea medication, call the clinic.   BELOW ARE SYMPTOMS THAT SHOULD BE REPORTED IMMEDIATELY:  *FEVER GREATER THAN 100.5 F  *CHILLS WITH OR WITHOUT FEVER  NAUSEA AND VOMITING THAT IS NOT CONTROLLED WITH YOUR NAUSEA MEDICATION  *UNUSUAL SHORTNESS OF BREATH  *UNUSUAL BRUISING OR BLEEDING  TENDERNESS IN MOUTH AND THROAT WITH OR WITHOUT PRESENCE OF ULCERS  *URINARY PROBLEMS  *BOWEL PROBLEMS  UNUSUAL RASH Items with * indicate a potential emergency and should be followed up as soon as possible.  Feel free to call the clinic should you have any questions or concerns. The clinic phone number is [phone].  Please show the CHEMO ALERT CARD at check-in to the Emergency Department and triage nurse.   

## 2017-09-02 ENCOUNTER — Ambulatory Visit
Admission: RE | Admit: 2017-09-02 | Discharge: 2017-09-02 | Disposition: A | Discharge status: Home / Self Care | Payer: 59 | Source: Ambulatory Visit | Attending: Radiation Oncology | Admitting: Radiation Oncology

## 2017-09-02 DIAGNOSIS — Z510 Encounter for antineoplastic radiation therapy: Secondary | ICD-10-CM | POA: Diagnosis not present

## 2017-09-03 ENCOUNTER — Ambulatory Visit
Admission: RE | Admit: 2017-09-03 | Discharge: 2017-09-03 | Disposition: A | Discharge status: Home / Self Care | Payer: 59 | Source: Ambulatory Visit | Attending: Radiation Oncology | Admitting: Radiation Oncology

## 2017-09-03 DIAGNOSIS — Z510 Encounter for antineoplastic radiation therapy: Secondary | ICD-10-CM | POA: Diagnosis not present

## 2017-09-06 ENCOUNTER — Ambulatory Visit
Admission: RE | Admit: 2017-09-06 | Discharge: 2017-09-06 | Disposition: A | Discharge status: Home / Self Care | Payer: 59 | Source: Ambulatory Visit | Attending: Radiation Oncology | Admitting: Radiation Oncology

## 2017-09-06 DIAGNOSIS — Z510 Encounter for antineoplastic radiation therapy: Secondary | ICD-10-CM | POA: Diagnosis not present

## 2017-09-07 ENCOUNTER — Ambulatory Visit
Admission: RE | Admit: 2017-09-07 | Discharge: 2017-09-07 | Disposition: A | Discharge status: Home / Self Care | Payer: 59 | Source: Ambulatory Visit | Attending: Radiation Oncology | Admitting: Radiation Oncology

## 2017-09-07 DIAGNOSIS — Z510 Encounter for antineoplastic radiation therapy: Secondary | ICD-10-CM | POA: Diagnosis not present

## 2017-09-08 ENCOUNTER — Ambulatory Visit
Admission: RE | Admit: 2017-09-08 | Discharge: 2017-09-08 | Disposition: A | Discharge status: Home / Self Care | Payer: 59 | Source: Ambulatory Visit | Attending: Radiation Oncology | Admitting: Radiation Oncology

## 2017-09-08 DIAGNOSIS — Z510 Encounter for antineoplastic radiation therapy: Secondary | ICD-10-CM | POA: Diagnosis not present

## 2017-09-09 ENCOUNTER — Ambulatory Visit
Admission: RE | Admit: 2017-09-09 | Discharge: 2017-09-09 | Disposition: A | Discharge status: Home / Self Care | Payer: 59 | Source: Ambulatory Visit | Attending: Radiation Oncology | Admitting: Radiation Oncology

## 2017-09-09 ENCOUNTER — Ambulatory Visit (HOSPITAL_COMMUNITY)
Admission: RE | Admit: 2017-09-09 | Discharge: 2017-09-09 | Disposition: A | Discharge status: Home / Self Care | Payer: 59 | Source: Ambulatory Visit | Attending: Adult Health | Admitting: Adult Health

## 2017-09-09 DIAGNOSIS — C50812 Malignant neoplasm of overlapping 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 breast: Secondary | ICD-10-CM | POA: Diagnosis not present

## 2017-09-09 DIAGNOSIS — Z5181 Encounter for therapeutic drug level monitoring: Secondary | ICD-10-CM | POA: Insufficient documentation

## 2017-09-09 DIAGNOSIS — C50811 Malignant neoplasm of overlapping sites of right female breast: Secondary | ICD-10-CM | POA: Diagnosis not present

## 2017-09-09 DIAGNOSIS — Z510 Encounter for antineoplastic radiation therapy: Secondary | ICD-10-CM | POA: Diagnosis not present

## 2017-09-09 DIAGNOSIS — Z170 Estrogen receptor positive status [ER+]: Secondary | ICD-10-CM

## 2017-09-09 DIAGNOSIS — Z5111 Encounter for antineoplastic chemotherapy: Secondary | ICD-10-CM

## 2017-09-09 NOTE — Progress Notes (Signed)
  Echocardiogram 2D Echocardiogram has been performed.  Amanda Williams 09/09/2017, 12:19 PM

## 2017-09-10 ENCOUNTER — Ambulatory Visit
Admission: RE | Admit: 2017-09-10 | Discharge: 2017-09-10 | Disposition: A | Discharge status: Home / Self Care | Payer: 59 | Source: Ambulatory Visit | Attending: Radiation Oncology | Admitting: Radiation Oncology

## 2017-09-10 DIAGNOSIS — C50812 Malignant neoplasm of overlapping 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 breast: Secondary | ICD-10-CM

## 2017-09-10 DIAGNOSIS — Z510 Encounter for antineoplastic radiation therapy: Secondary | ICD-10-CM | POA: Diagnosis not present

## 2017-09-10 DIAGNOSIS — Z170 Estrogen receptor positive status [ER+]: Secondary | ICD-10-CM

## 2017-09-10 MED ORDER — SONAFINE EX EMUL
1.0000 "application " | Freq: Once | CUTANEOUS | Status: AC
  Administered 2017-09-10: 1 via TOPICAL

## 2017-09-13 ENCOUNTER — Ambulatory Visit
Admission: RE | Admit: 2017-09-13 | Discharge: 2017-09-13 | Disposition: A | Discharge status: Home / Self Care | Payer: 59 | Source: Ambulatory Visit | Attending: Radiation Oncology | Admitting: Radiation Oncology

## 2017-09-13 DIAGNOSIS — Z510 Encounter for antineoplastic radiation therapy: Secondary | ICD-10-CM | POA: Diagnosis not present

## 2017-09-14 ENCOUNTER — Ambulatory Visit
Admission: RE | Admit: 2017-09-14 | Discharge: 2017-09-14 | Disposition: A | Discharge status: Home / Self Care | Payer: 59 | Source: Ambulatory Visit | Attending: Radiation Oncology | Admitting: Radiation Oncology

## 2017-09-14 DIAGNOSIS — Z510 Encounter for antineoplastic radiation therapy: Secondary | ICD-10-CM | POA: Diagnosis not present

## 2017-09-15 ENCOUNTER — Ambulatory Visit
Admission: RE | Admit: 2017-09-15 | Discharge: 2017-09-15 | Disposition: A | Discharge status: Home / Self Care | Payer: 59 | Source: Ambulatory Visit | Attending: Radiation Oncology | Admitting: Radiation Oncology

## 2017-09-15 DIAGNOSIS — Z510 Encounter for antineoplastic radiation therapy: Secondary | ICD-10-CM | POA: Diagnosis not present

## 2017-09-16 ENCOUNTER — Ambulatory Visit
Admission: RE | Admit: 2017-09-16 | Discharge: 2017-09-16 | Disposition: A | Discharge status: Home / Self Care | Payer: 59 | Source: Ambulatory Visit | Attending: Radiation Oncology | Admitting: Radiation Oncology

## 2017-09-16 ENCOUNTER — Ambulatory Visit: Payer: 59

## 2017-09-16 DIAGNOSIS — Z510 Encounter for antineoplastic radiation therapy: Secondary | ICD-10-CM | POA: Diagnosis not present

## 2017-09-17 ENCOUNTER — Encounter: Payer: Self-pay | Admitting: *Deleted

## 2017-09-17 ENCOUNTER — Ambulatory Visit: Payer: 59

## 2017-09-17 ENCOUNTER — Ambulatory Visit
Admission: RE | Admit: 2017-09-17 | Discharge: 2017-09-17 | Disposition: A | Discharge status: Home / Self Care | Payer: 59 | Source: Ambulatory Visit | Attending: Radiation Oncology | Admitting: Radiation Oncology

## 2017-09-17 DIAGNOSIS — C50811 Malignant neoplasm of overlapping sites of right female breast: Secondary | ICD-10-CM

## 2017-09-17 DIAGNOSIS — Z170 Estrogen receptor positive status [ER+]: Secondary | ICD-10-CM

## 2017-09-17 DIAGNOSIS — Z510 Encounter for antineoplastic radiation therapy: Secondary | ICD-10-CM | POA: Diagnosis not present

## 2017-09-17 MED ORDER — SILVER SULFADIAZINE 1 % EX CREA
TOPICAL_CREAM | Freq: Two times a day (BID) | CUTANEOUS | Status: DC
  Administered 2017-09-17: 13:00:00 via TOPICAL

## 2017-09-17 MED ORDER — SONAFINE EX EMUL
1.0000 "application " | Freq: Two times a day (BID) | CUTANEOUS | Status: DC
  Administered 2017-09-17: 1 via TOPICAL

## 2017-09-17 NOTE — Progress Notes (Signed)
[place] return telephone Mrs. Coller said she removed the silvadene after listening to my voicemail from earlier.  She asked what else could she use over the weekend. 1350 I checked with Dr. Lisbeth Renshaw and he ordered Gentian violet to be used two to three times a day apply to the right axilla.

## 2017-09-17 NOTE — Progress Notes (Signed)
[place] patient asked to remove the silvadene cream with a cool washcloth because of her allergy to sulfonamide derivatives and to call me at [phone] and ask for Eileen Croswell.  [place] Dr. Lisbeth Renshaw aware of allery to sulfonamide derivatives and that I called the patient to check on her and asked to remove the silvadene and call me back.

## 2017-09-17 NOTE — Progress Notes (Signed)
1210 Instructions given to use silvadene cream twice a day to right axilla wash off before applying each time applied before leaving after her PUT visit.

## 2017-09-20 ENCOUNTER — Ambulatory Visit: Payer: 59

## 2017-09-20 ENCOUNTER — Ambulatory Visit
Admission: RE | Admit: 2017-09-20 | Discharge: 2017-09-20 | Disposition: A | Discharge status: Home / Self Care | Payer: 59 | Source: Ambulatory Visit | Attending: Radiation Oncology | Admitting: Radiation Oncology

## 2017-09-20 ENCOUNTER — Other Ambulatory Visit: Payer: Self-pay | Admitting: Hematology and Oncology

## 2017-09-20 DIAGNOSIS — Z510 Encounter for antineoplastic radiation therapy: Secondary | ICD-10-CM | POA: Insufficient documentation

## 2017-09-20 DIAGNOSIS — C50812 Malignant neoplasm of overlapping 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 breast: Secondary | ICD-10-CM | POA: Diagnosis not present

## 2017-09-20 DIAGNOSIS — Z170 Estrogen receptor positive status [ER+]: Secondary | ICD-10-CM | POA: Insufficient documentation

## 2017-09-21 ENCOUNTER — Other Ambulatory Visit: Payer: Self-pay

## 2017-09-21 ENCOUNTER — Ambulatory Visit
Admission: RE | Admit: 2017-09-21 | Discharge: 2017-09-21 | Disposition: A | Discharge status: Home / Self Care | Payer: 59 | Source: Ambulatory Visit | Attending: Radiation Oncology | Admitting: Radiation Oncology

## 2017-09-21 DIAGNOSIS — Z510 Encounter for antineoplastic radiation therapy: Secondary | ICD-10-CM | POA: Diagnosis not present

## 2017-09-21 NOTE — Telephone Encounter (Signed)
[facility] regarding Ambien 5 mg.  Per notes, this was originally ordered by [employer].  Outgoing call to pt, no answer, pt has appt with Dr Lindi Adie tomorrow so I left pt a VM to remind her to discuss with Dr Lindi Adie at her appt if she feels she needs additional refill of Ambien.

## 2017-09-22 ENCOUNTER — Inpatient Hospital Stay: Payer: 59

## 2017-09-22 ENCOUNTER — Inpatient Hospital Stay: Payer: 59 | Attending: Hematology and Oncology

## 2017-09-22 ENCOUNTER — Ambulatory Visit
Admission: RE | Admit: 2017-09-22 | Discharge: 2017-09-22 | Disposition: A | Discharge status: Home / Self Care | Payer: 59 | Source: Ambulatory Visit | Attending: Radiation Oncology | Admitting: Radiation Oncology

## 2017-09-22 ENCOUNTER — Telehealth: Payer: Self-pay | Admitting: Hematology and Oncology

## 2017-09-22 ENCOUNTER — Encounter: Payer: Self-pay | Admitting: Radiation Oncology

## 2017-09-22 ENCOUNTER — Inpatient Hospital Stay (HOSPITAL_BASED_OUTPATIENT_CLINIC_OR_DEPARTMENT_OTHER): Payer: 59 | Admitting: Hematology and Oncology

## 2017-09-22 DIAGNOSIS — C50811 Malignant neoplasm of overlapping sites of right female breast: Secondary | ICD-10-CM

## 2017-09-22 DIAGNOSIS — Z79899 Other long term (current) drug therapy: Secondary | ICD-10-CM | POA: Diagnosis not present

## 2017-09-22 DIAGNOSIS — Z170 Estrogen receptor positive status [ER+]: Secondary | ICD-10-CM | POA: Diagnosis not present

## 2017-09-22 DIAGNOSIS — Z510 Encounter for antineoplastic radiation therapy: Secondary | ICD-10-CM | POA: Diagnosis not present

## 2017-09-22 DIAGNOSIS — Z9013 Acquired absence of bilateral breasts and nipples: Secondary | ICD-10-CM

## 2017-09-22 DIAGNOSIS — Z9221 Personal history of antineoplastic chemotherapy: Secondary | ICD-10-CM | POA: Insufficient documentation

## 2017-09-22 DIAGNOSIS — C773 Secondary and unspecified malignant neoplasm of axilla and upper limb lymph nodes: Secondary | ICD-10-CM | POA: Insufficient documentation

## 2017-09-22 DIAGNOSIS — C50812 Malignant neoplasm of overlapping 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 breast: Secondary | ICD-10-CM | POA: Diagnosis not present

## 2017-09-22 DIAGNOSIS — C50011 Malignant neoplasm of nipple and areola, right female breast: Secondary | ICD-10-CM

## 2017-09-22 DIAGNOSIS — C50012 Malignant neoplasm of nipple and areola, left female breast: Principal | ICD-10-CM

## 2017-09-22 DIAGNOSIS — Z5112 Encounter for antineoplastic immunotherapy: Secondary | ICD-10-CM | POA: Diagnosis not present

## 2017-09-22 LAB — CBC WITH DIFFERENTIAL (CANCER CENTER ONLY)
BASOS ABS: 0 10*3/uL (ref 0.0–0.1)
BASOS PCT: 1 %
Eosinophils Absolute: 0 10*3/uL (ref 0.0–0.5)
Eosinophils Relative: 1 %
HEMATOCRIT: 33.6 % — AB (ref 34.8–46.6)
HEMOGLOBIN: 11.2 g/dL — AB (ref 11.6–15.9)
LYMPHS PCT: 16 %
Lymphs Abs: 0.6 10*3/uL — ABNORMAL LOW (ref 0.9–3.3)
MCH: 31 pg (ref 25.1–34.0)
MCHC: 33.3 g/dL (ref 31.5–36.0)
MCV: 93.1 fL (ref 79.5–101.0)
Monocytes Absolute: 0.4 10*3/uL (ref 0.1–0.9)
Monocytes Relative: 9 %
NEUTROS ABS: 2.9 10*3/uL (ref 1.5–6.5)
NEUTROS PCT: 73 %
Platelet Count: 212 10*3/uL (ref 145–400)
RBC: 3.61 MIL/uL — ABNORMAL LOW (ref 3.70–5.45)
RDW: 12.9 % (ref 11.2–14.5)
WBC Count: 4 10*3/uL (ref 3.9–10.3)

## 2017-09-22 LAB — CMP (CANCER CENTER ONLY)
ALBUMIN: 3.6 g/dL (ref 3.5–5.0)
ALK PHOS: 99 U/L (ref 38–126)
ALT: 16 U/L (ref 0–44)
AST: 19 U/L (ref 15–41)
Anion gap: 9 (ref 5–15)
BUN: 13 mg/dL (ref 6–20)
CALCIUM: 9.2 mg/dL (ref 8.9–10.3)
CO2: 27 mmol/L (ref 22–32)
CREATININE: 0.95 mg/dL (ref 0.44–1.00)
Chloride: 101 mmol/L (ref 98–111)
GFR, Est AFR Am: >60 mL/min (ref >60)
GLUCOSE: 147 mg/dL — AB (ref 70–99)
Potassium: 3.8 mmol/L (ref 3.5–5.1)
Sodium: 137 mmol/L (ref 135–145)
TOTAL PROTEIN: 7 g/dL (ref 6.5–8.1)
Total Bilirubin: 0.3 mg/dL (ref 0.3–1.2)

## 2017-09-22 MED ORDER — SONAFINE EX EMUL
1.0000 "application " | Freq: Two times a day (BID) | CUTANEOUS | Status: DC
  Administered 2017-09-22: 1 via TOPICAL

## 2017-09-22 MED ORDER — ACETAMINOPHEN 325 MG PO TABS
650.0000 mg | ORAL_TABLET | Freq: Once | ORAL | Status: AC
  Administered 2017-09-22: 650 mg via ORAL

## 2017-09-22 MED ORDER — ZOLPIDEM TARTRATE 5 MG PO TABS: 5.0000 mg | ORAL_TABLET | Freq: Every evening | ORAL | 1 refills | Status: DC | PRN

## 2017-09-22 MED ORDER — SODIUM CHLORIDE 0.9% FLUSH
10.0000 mL | Freq: Once | INTRAVENOUS | Status: AC
  Administered 2017-09-22: 10 mL
  Filled 2017-09-22: qty 10

## 2017-09-22 MED ORDER — SODIUM CHLORIDE 0.9 % IV SOLN
Freq: Once | INTRAVENOUS | Status: AC
  Administered 2017-09-22: 09:00:00 via INTRAVENOUS

## 2017-09-22 MED ORDER — DIPHENHYDRAMINE HCL 25 MG PO CAPS
25.0000 mg | ORAL_CAPSULE | Freq: Once | ORAL | Status: AC
  Administered 2017-09-22: 25 mg via ORAL

## 2017-09-22 MED ORDER — ACETAMINOPHEN 325 MG PO TABS
ORAL_TABLET | ORAL | Status: AC
  Filled 2017-09-22: qty 2

## 2017-09-22 MED ORDER — HEPARIN SOD (PORK) LOCK FLUSH 100 UNIT/ML IV SOLN
500.0000 [IU] | Freq: Once | INTRAVENOUS | Status: AC | PRN
  Administered 2017-09-22: 500 [IU]
  Filled 2017-09-22: qty 5

## 2017-09-22 MED ORDER — TRASTUZUMAB CHEMO 150 MG IV SOLR
6.0000 mg/kg | Freq: Once | INTRAVENOUS | Status: AC
  Administered 2017-09-22: 399 mg via INTRAVENOUS
  Filled 2017-09-22: qty 19

## 2017-09-22 MED ORDER — DIPHENHYDRAMINE HCL 25 MG PO CAPS
ORAL_CAPSULE | ORAL | Status: AC
  Filled 2017-09-22: qty 1

## 2017-09-22 MED ORDER — SODIUM CHLORIDE 0.9% FLUSH
10.0000 mL | INTRAVENOUS | Status: DC | PRN
  Administered 2017-09-22: 10 mL
  Filled 2017-09-22: qty 10

## 2017-09-22 NOTE — Assessment & Plan Note (Signed)
01/12/2017:Left mastectomy: IDC with DCIS, 2 tumors 3.6 cm ER 90%, PR 95%, Ki-67 5%, HER-2 negative ratio 1.32) and 1.2 cm (ER 95% PR 95% positive Her 2 Positive ratio 3.2, Ki-67 5%) T2 N0 stage Ib Right mastectomy: Invasive and in situ lobular carcinoma involves the dermis of the skin of the nipple, margins negative, 3/3 lymph nodes positive, grade 1, ER 75%, PR 95%, HER-2 negative ratio 1.19, Ki-67 10%, T3N1A stage IIa  Treatment plan: 1. Adjuvant chemotherapy with TCH x 4 cycles completed on 06/09/2017 (Taxotere held from final 2 cycles) followed by Herceptin maintenance for 1 year 2. Adjuvant radiation therapy started 08/10/2017 3. Followed by adjuvant antiestrogen therapy __________________________________________________________________________________________ Echo on 06/16/2017: 55% - 60% Current treatment: Herceptin maintenance and radiation Herceptin toxicities: None  Once radiation is complete, she will start antiestrogen therapy.  Letrozole counseling:We discussed the risks and benefits of anti-estrogen therapy with aromatase inhibitors. These include but not limited to insomnia, hot flashes, mood changes, vaginal dryness, bone density loss, and weight gain. We strongly believe that the benefits far outweigh the risks. Patient understands these risks and consented to starting treatment. Planned treatment duration is 7 years.  Return to clinic every 3 weeks for Herceptin every 6 weeks for follow-up with labs

## 2017-09-22 NOTE — Progress Notes (Signed)
Patient Care Team: Panosh, Standley Brooking, MD as PCP - General Delsa Bern, MD (Obstetrics and Gynecology) Excell Seltzer, MD as Consulting Physician (General Surgery) Nicholas Lose, MD as Consulting Physician (Hematology and Oncology) Kyung Rudd, MD as Consulting Physician (Radiation Oncology)  DIAGNOSIS:  Encounter Diagnosis  Name Primary?  . Malignant neoplasm of overlapping sites of right breast in female, estrogen receptor positive ([place])     SUMMARY OF ONCOLOGIC HISTORY:   Malignant neoplasm of overlapping 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 breast ([place])   01/12/2017 Surgery    Left mastectomy: IDC with DCIS, 2 tumors 3.6 cm ER 90%, PR 95%, Ki-67 5%, HER-2 negative ratio 1.32) and 1.2 cm (ER 95% PR 95% positive Her 2 Positive ratio 3.2, Ki-67 5%) T2 N0 stage Ib      02/16/2017 -  Chemotherapy    TCH x6 followed by Herceptin maintenance        Malignant neoplasm of overlapping sites of right breast in female, estrogen receptor positive ([place])   12/21/2016 Initial Diagnosis    Palpable lumps in the right breast with nipple inversion: Mammogram revealed skin thickening and distortion, ultrasound revealed 4 masses 3.1 cm at 11:00, 2.5 cm at 6:00, 4 cm at 8:00, 2.2 cm at 5:00 with 2 abnormal axillary lymph nodes: MRI revealed 7 cm abnormality on right breast, in addition 3.6 cm abnormality in the left breast and 2 additional masses 1 cm each; right breast: T4 N1 stage III B clinical stage; left breast: T2 N0 stage IB clinical stage      12/21/2016 Pathology Results    Right breast: Grade 1 Invasive lobular cancer, lymph node also positive, ER 70%, PR 90%, Ki-67 10%, HER-2 negative ratio 1.09 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  The Multi-Cancer Panel was ordered. The Multi-Cancer Panel offered by [facility] includes  sequencing and/or deletion duplication testing of the following 83 genes: ALK, APC, ATM, AXIN2,BAP1,  BARD1, BLM, BMPR1A, BRCA1, BRCA2, BRIP1, CASR, CDC73, CDH1, CDK4, CDKN1B, CDKN1C, CDKN2A (p14ARF), CDKN2A (p16INK4a), CEBPA, CHEK2, CTNNA1, DICER1, DIS3L2, EGFR (c.2369C>T, p.Thr790Met variant only), EPCAM (Deletion/duplication testing only), FH, FLCN, GATA2, GPC3, GREM1 (Promoter region deletion/duplication testing only), HOXB13 (c.251G>A, p.Gly84Glu), HRAS, KIT, MAX, MEN1, MET, MITF (c.952G>A, p.Glu318Lys variant only), MLH1, MSH2, MSH3, MSH6, MUTYH, NBN, NF1, NF2, NTHL1, PALB2, PDGFRA, PHOX2B, PMS2, POLD1, POLE, POT1, PRKAR1A, PTCH1, PTEN, RAD50, RAD51C, RAD51D, RB1, RECQL4, RET, RUNX1, SDHAF2, SDHA (sequence changes only), SDHB, SDHC, SDHD, SMAD4, SMARCA4, SMARCB1, SMARCE1, STK11, SUFU, TERC, TERT, TMEM127, TP53, TSC1, TSC2, VHL, WRN and WT1.   Results: Negative, no pathogenic variants identified.  The date of this test report is 01/04/2017.        01/12/2017 Surgery    Right mastectomy: Invasive and in situ lobular carcinoma involves the dermis of the skin of the nipple, margins negative, 3/3 lymph nodes positive, grade 1, EF 75%, PR 95%, HER-2 negative ratio 1.19, Ki-67 10%, T3N1A stage IIa      08/10/2017 - 09/22/2017 Radiation Therapy    Adjuvant radiation       CHIEF COMPLIANT: Follow-up on Herceptin after completion of adjuvant radiation  INTERVAL HISTORY: Amanda Williams is a 52 year old with above-mentioned history of right breast cancer treated with mastectomy after neoadjuvant chemotherapy.  She is finishing radiation today.  She is here to discuss further  steps in adjuvant treatment plan.  She is experiencing severe radiation dermatitis.  REVIEW OF SYSTEMS:   Constitutional: Denies fevers, chills or abnormal weight loss Eyes: Denies blurriness of vision Ears, nose, mouth, throat, and face: Denies mucositis or sore throat Respiratory: Denies cough, dyspnea or wheezes Cardiovascular:  Denies palpitation, chest discomfort Gastrointestinal:  Denies nausea, heartburn or change in bowel habits Skin: Denies abnormal skin rashes Lymphatics: Denies new lymphadenopathy or easy bruising Neurological:Denies numbness, tingling or new weaknesses Behavioral/Psych: Mood is stable, no new changes  Extremities: No lower extremity edema Breast: Radiation dermatitis All other systems were reviewed with the patient and are negative.  I have reviewed the past medical history, past surgical history, social history and family history with the patient and they are unchanged from previous note.  ALLERGIES:  is allergic to penicillins; sulfonamide derivatives; and adhesive [tape].  MEDICATIONS:  Current Outpatient Medications  Medication Sig Dispense Refill  . acetaminophen (TYLENOL) 500 MG tablet Take 1,000 mg by mouth 2 (two) times daily as needed for moderate pain (for pain/headaches.).     Marland Kitchen ibuprofen (ADVIL,MOTRIN) 200 MG tablet Take 400 mg by mouth 2 (two) times daily as needed for moderate pain (for pain/headaches).     . lidocaine-prilocaine (EMLA) cream Apply to affected area once (Patient taking differently: Apply 1 application topically daily as needed (every 3 weeks prior to infusion.). Apply to affected area once) 30 g 3  . sertraline (ZOLOFT) 100 MG tablet Take 100 mg by mouth at bedtime.     Marland Kitchen zolpidem (AMBIEN) 5 MG tablet Take 1 tablet (5 mg total) by mouth at bedtime as needed for sleep. 30 tablet 1   No current facility-administered medications for this visit.     PHYSICAL EXAMINATION: ECOG PERFORMANCE STATUS: 1 - Symptomatic but completely ambulatory  Vitals:   09/22/17 0901  BP: (!) 107/57  Pulse: 70  Resp: 18  Temp: 98 F (36.7 C)  SpO2: 99%   Filed Weights   09/22/17 0901  Weight: 150 lb (68 kg)    GENERAL:alert, no distress and comfortable SKIN: skin color, texture, turgor are normal, no rashes or significant lesions EYES: normal, Conjunctiva are pink  and non-injected, sclera clear OROPHARYNX:no exudate, no erythema and lips, buccal mucosa, and tongue normal  NECK: supple, thyroid normal size, non-tender, without nodularity LYMPH:  no palpable lymphadenopathy in the cervical, axillary or inguinal LUNGS: clear to auscultation and percussion with normal breathing effort HEART: regular rate & rhythm and no murmurs and no lower extremity edema ABDOMEN:abdomen soft, non-tender and normal bowel sounds MUSCULOSKELETAL:no cyanosis of digits and no clubbing  NEURO: alert & oriented x 3 with fluent speech, no focal motor/sensory deficits EXTREMITIES: No lower extremity edema   LABORATORY DATA:  I have reviewed the data as listed CMP Latest Ref Rng & Units 08/11/2017 06/30/2017 06/09/2017  Glucose 70 - 140 mg/dL 130 129 212(H)  BUN 7 - 26 mg/dL 12 16 13   Creatinine 0.60 - 1.10 mg/dL 0.89 0.87 0.87  Sodium 136 - 145 mmol/L 141 141 138  Potassium 3.5 - 5.1 mmol/L 3.7 3.7 4.1  Chloride 98 - 109 mmol/L 106 106 104  CO2 22 - 29 mmol/L 25 25 24   Calcium 8.4 - 10.4 mg/dL 9.2 9.2 9.0  Total Protein 6.4 - 8.3 g/dL 7.2 7.1 6.8  Total Bilirubin 0.2 - 1.2 mg/dL 0.4 0.4 0.4  Alkaline Phos 40 - 150 U/L 87 84 84  AST 5 - 34 U/L 24 29 24   ALT 0 -  55 U/L 21 31 31     Lab Results  Component Value Date   WBC 4.0 09/22/2017   HGB 11.2 (L) 09/22/2017   HCT 33.6 (L) 09/22/2017   MCV 93.1 09/22/2017   PLT 212 09/22/2017   NEUTROABS 2.9 09/22/2017    ASSESSMENT & PLAN:  Malignant neoplasm of overlapping sites of right breast in female, estrogen receptor positive ([place]) 01/12/2017:Left mastectomy: IDC with DCIS, 2 tumors 3.6 cm ER 90%, PR 95%, Ki-67 5%, HER-2 negative ratio 1.32) and 1.2 cm (ER 95% PR 95% positive Her 2 Positive ratio 3.2, Ki-67 5%) T2 N0 stage Ib Right mastectomy: Invasive and in situ lobular carcinoma involves the dermis of the skin of the nipple, margins negative, 3/3 lymph nodes positive, grade 1, ER 75%, PR 95%, HER-2 negative ratio 1.19,  Ki-67 10%, T3N1A stage IIa  Treatment plan: 1. Adjuvant chemotherapy with TCH x 4 cycles completed on 06/09/2017 (Taxotere held from final 2 cycles) followed by Herceptin maintenance for 1 year 2. Adjuvant radiation therapy started 08/10/2017 3. Followed by adjuvant antiestrogen therapy __________________________________________________________________________________________ Echo on 06/16/2017: 55% - 60% Current treatment: Herceptin maintenance and radiation Herceptin toxicities: None  Today's last day of radiation so she will start antiestrogen therapy in a few weeks.  We discussed the different options including tamoxifen versus letrozole I would need to check her menopausal status with [facility] and estradiol before deciding on the actual treatment plan.  We will draw those labs today in the infusion.  Letrozole counseling:We discussed the risks and benefits of anti-estrogen therapy with aromatase inhibitors. These include but not limited to insomnia, hot flashes, mood changes, vaginal dryness, bone density loss, and weight gain. We strongly believe that the benefits far outweigh the risks. Patient understands these risks and consented to starting treatment. Planned treatment duration is 7 years.  Return to clinic every 3 weeks for Herceptin every 6 weeks for follow-up with labs   Orders Placed This Encounter  Procedures  . Follicle stimulating hormone    Standing Status:   Future    Standing Expiration Date:   10/27/2018  . Estradiol, Ultra Sens   The patient has a good understanding of the overall plan. she agrees with it. she will call with any problems that may develop before the next visit here.   Harriette Ohara, MD 09/22/17

## 2017-09-22 NOTE — Telephone Encounter (Signed)
Gave patient AVS.  Patient my chart active, did not need calendar printed.

## 2017-09-22 NOTE — Patient Instructions (Signed)
[facility] Discharge Instructions for Patients Receiving Chemotherapy  Today you received the following chemotherapy agents trastuzumab (Herceptin)  To help prevent nausea and vomiting after your treatment, we encourage you to take your nausea medication as directed  If you develop nausea and vomiting that is not controlled by your nausea medication, call the clinic.   BELOW ARE SYMPTOMS THAT SHOULD BE REPORTED IMMEDIATELY:  *FEVER GREATER THAN 100.5 F  *CHILLS WITH OR WITHOUT FEVER  NAUSEA AND VOMITING THAT IS NOT CONTROLLED WITH YOUR NAUSEA MEDICATION  *UNUSUAL SHORTNESS OF BREATH  *UNUSUAL BRUISING OR BLEEDING  TENDERNESS IN MOUTH AND THROAT WITH OR WITHOUT PRESENCE OF ULCERS  *URINARY PROBLEMS  *BOWEL PROBLEMS  UNUSUAL RASH Items with * indicate a potential emergency and should be followed up as soon as possible.  Feel free to call the clinic should you have any questions or concerns. The clinic phone number is [phone].  Please show the CHEMO ALERT CARD at check-in to the Emergency Department and triage nurse.   

## 2017-09-23 ENCOUNTER — Ambulatory Visit: Payer: 59

## 2017-09-23 LAB — FOLLICLE STIMULATING HORMONE: FSH: 44.4 m[IU]/mL

## 2017-09-24 ENCOUNTER — Telehealth: Payer: Self-pay | Admitting: Hematology and Oncology

## 2017-09-24 ENCOUNTER — Ambulatory Visit: Payer: 59

## 2017-09-24 ENCOUNTER — Other Ambulatory Visit: Payer: Self-pay | Admitting: Hematology and Oncology

## 2017-09-24 MED ORDER — LETROZOLE 2.5 MG PO TABS: 2.5000 mg | ORAL_TABLET | Freq: Every day | ORAL | 3 refills | Status: DC

## 2017-09-24 NOTE — Telephone Encounter (Signed)
I left a voicemail with the patient that the [facility] was 47 which suggests menopause. I sent a prescription for letrozole to her pharmacy.

## 2017-09-27 ENCOUNTER — Ambulatory Visit: Payer: 59

## 2017-09-27 LAB — ESTRADIOL, ULTRA SENS: Estradiol, Sensitive: 4.1 pg/mL

## 2017-09-28 ENCOUNTER — Ambulatory Visit: Payer: 59

## 2017-09-28 ENCOUNTER — Encounter: Payer: Self-pay | Admitting: *Deleted

## 2017-09-28 NOTE — Progress Notes (Signed)
1320 Returned call about sonafine explained that once radiation treatment is completed skin care products are not given to a patient.  I explained there is no way to charge for the skin care product once radiation treatment ends.   I suggested that she use a lotion with vitamin E or use vitamin E oil. Mentioned the product can also be purchased on [employer]. I asked her if she needed to be seen sooner than her 10-25-17 follow up appointment after she stated her skin was broken which her skin was peeling 09-22-17 at her final treatment and she was using Hydrocortisone with Lidocaine; she said no she would handle the situation. I said I was sorry that I could not give her the product. 1415 Mentioned to Shona Simpson, PA and charge Joaquim Lai RN permission given from Dr. Lisbeth Renshaw to give a tube of sonafine to Mrs. Maxine Glenn. 1440 Called left a message for Mrs. Rorrer to call me at [phone].

## 2017-09-29 ENCOUNTER — Encounter: Payer: Self-pay | Admitting: *Deleted

## 2017-09-29 NOTE — Progress Notes (Addendum)
0955 Left a second message that she could pick up a tube of sonafine from Radiation Oncology per Dr. Lisbeth Renshaw. If she has questions call [phone] and ask for Tonie.

## 2017-09-29 NOTE — Progress Notes (Signed)
  Radiation Oncology         [phone] ________________________________  Name: Amanda Williams MRN: [id number]  Date: 09/22/2017  DOB: 1965/04/19  End of Treatment Note  Diagnosis:   52 y.o. 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 This was delivered using a 3-D conformal, 4 field technique. The patient then received a boost to the mastectomy scar. This delivered an additional 10 Gy in 5 fractions using an en face electron field. The total dose was 60.4 Gy.  Narrative: The patient tolerated radiation treatment relatively well.   The patient had some expected skin irritation as she progressed during treatment. Moist desquamation was present in the upper axilla at the end of treatment. She was advised to apply Gentian Violet to this area.  Plan: The patient has completed radiation treatment. The patient will return to radiation oncology clinic for routine followup in one month. I advised the patient to call or return sooner if they have any questions or concerns related to their recovery or treatment. ________________________________  Jodelle Gross, MD, PhD  This document serves as a record of services personally performed by Kyung Rudd, MD. It was created on his behalf by Rae Lips, a trained medical scribe. The creation of this record is based on the scribe's personal observations and the provider's statements to them. This document has been checked and approved by the attending provider.

## 2017-10-13 ENCOUNTER — Telehealth: Payer: Self-pay | Admitting: Hematology and Oncology

## 2017-10-13 ENCOUNTER — Inpatient Hospital Stay: Payer: 59

## 2017-10-13 VITALS — BP 112/89 | HR 69 | Temp 99.0°F | Resp 16 | Ht 66.0 in | Wt 150.0 lb

## 2017-10-13 DIAGNOSIS — Z5112 Encounter for antineoplastic immunotherapy: Secondary | ICD-10-CM | POA: Diagnosis not present

## 2017-10-13 DIAGNOSIS — Z170 Estrogen receptor positive status [ER+]: Principal | ICD-10-CM

## 2017-10-13 DIAGNOSIS — C50012 Malignant neoplasm of nipple and areola, left female breast: Principal | ICD-10-CM

## 2017-10-13 DIAGNOSIS — C50011 Malignant neoplasm of nipple and areola, right female breast: Secondary | ICD-10-CM

## 2017-10-13 MED ORDER — SODIUM CHLORIDE 0.9% FLUSH
10.0000 mL | INTRAVENOUS | Status: DC | PRN
  Administered 2017-10-13: 10 mL
  Filled 2017-10-13: qty 10

## 2017-10-13 MED ORDER — TRASTUZUMAB CHEMO 150 MG IV SOLR
6.0000 mg/kg | Freq: Once | INTRAVENOUS | Status: AC
  Administered 2017-10-13: 399 mg via INTRAVENOUS
  Filled 2017-10-13: qty 19

## 2017-10-13 MED ORDER — ACETAMINOPHEN 325 MG PO TABS
ORAL_TABLET | ORAL | Status: AC
  Filled 2017-10-13: qty 2

## 2017-10-13 MED ORDER — SODIUM CHLORIDE 0.9 % IV SOLN
Freq: Once | INTRAVENOUS | Status: AC
  Administered 2017-10-13: 09:00:00 via INTRAVENOUS
  Filled 2017-10-13: qty 250

## 2017-10-13 MED ORDER — ACETAMINOPHEN 325 MG PO TABS
650.0000 mg | ORAL_TABLET | Freq: Once | ORAL | Status: AC
  Administered 2017-10-13: 650 mg via ORAL

## 2017-10-13 MED ORDER — DIPHENHYDRAMINE HCL 25 MG PO CAPS
ORAL_CAPSULE | ORAL | Status: AC
  Filled 2017-10-13: qty 1

## 2017-10-13 MED ORDER — DIPHENHYDRAMINE HCL 25 MG PO CAPS
25.0000 mg | ORAL_CAPSULE | Freq: Once | ORAL | Status: AC
  Administered 2017-10-13: 25 mg via ORAL

## 2017-10-13 MED ORDER — HEPARIN SOD (PORK) LOCK FLUSH 100 UNIT/ML IV SOLN
500.0000 [IU] | Freq: Once | INTRAVENOUS | Status: AC | PRN
  Administered 2017-10-13: 500 [IU]
  Filled 2017-10-13: qty 5

## 2017-10-13 NOTE — Patient Instructions (Signed)
[facility] Discharge Instructions for Patients Receiving Chemotherapy  Today you received the following chemotherapy agents: Trastuzumab (Herceptin).  To help prevent nausea and vomiting after your treatment, we encourage you to take your nausea medication as prescribed. If you develop nausea and vomiting that is not controlled by your nausea medication, call the clinic.   BELOW ARE SYMPTOMS THAT SHOULD BE REPORTED IMMEDIATELY:  *FEVER GREATER THAN 100.5 F  *CHILLS WITH OR WITHOUT FEVER  NAUSEA AND VOMITING THAT IS NOT CONTROLLED WITH YOUR NAUSEA MEDICATION  *UNUSUAL SHORTNESS OF BREATH  *UNUSUAL BRUISING OR BLEEDING  TENDERNESS IN MOUTH AND THROAT WITH OR WITHOUT PRESENCE OF ULCERS  *URINARY PROBLEMS  *BOWEL PROBLEMS  UNUSUAL RASH Items with * indicate a potential emergency and should be followed up as soon as possible.  Feel free to call the clinic should you have any questions or concerns. The clinic phone number is [phone].  Please show the CHEMO ALERT CARD at check-in to the Emergency Department and triage nurse.   

## 2017-10-13 NOTE — Telephone Encounter (Signed)
Faxed medical records to [employer]) at [phone], Release ID: [id number]

## 2017-10-15 NOTE — H&P (Signed)
Patient ID: Amanda Williams is a 52 y.o. female.  HPI  3 months  post op implant exchnage. Completed adjuvant radiation to right 7.3.19.  Presented with palpable mass with nipple inversion of the right breast. MMG noted distortion in the posterior aspect of the breast and thickening of the skin. [place] of RIGHT breast revealed a 3.1 cm mass at 11:00, 2.5 cm mass at 6:00, 4 cm mass at 8:00, and a 2.2 cm mass at 5:00. In the axilla 2 of the lymph nodes had cortical thickening. Biopsy of the right breast at the 11:00, 5:30, and 8:00 positions showed ILC, ER/PR +, HER2 -.  The axillary node was positive for carcinoma.   MRI demonstrated enhancement of the skin, with patchy nodular enhancement of bilateral breasts. Within the RIGHT breast, there was diffuse enhancement in all 4 quadrants. On the LEFT there were three spiculated masses 4 quadrants. LEFT breast US revealed 3.6 cm mass at 9:00 and 1 cm mass at 12:00, and her axilla was negative. No [place] correlate for third MRI site. Biopsy of two sites noted on [place] with IDC.  Final pathology LEFT IDC with DCIS, two tumors : 3.6 cm ER/PR+, Her2- and 1.2 cm ER/PR+, Her2+, margins clear 0/5 SLN. 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   Genetics negative. Tamoxifen started neoadjuvantly; letrozole prescribed at this time  Staging scans negative for distant disease.  Prior 50 B, desires larger. 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 Have 2 teenage children at home.  Review of Systems     Objective:   Physical Exam  Cardiovascular: Normal rate, regular rhythm and normal heart sounds.   Pulmonary/Chest: Effort normal and breath sounds normal.  Abdominal: Soft.   Chest: some rippling noted bilateral superior poles with leaning forward, symmetric volume right side with eyrthema/hyperpigmentation from recent XRT, no open areas, soft bilateral  Assessment:     Right  breast ca multicentric (ILC) ER+ Left breast ca multicentric (IDC) S/p right MRM, Left total mastectomy, left SLN, bilateral prepectoral TE/ADM (Alloderm) reconstruction Adjuvant chemotherapy S/p removal TE, placement silicone implants Adjuvant radiation    Plan:     Plan fat grafting to bilateral chest. We reviewed timing of this can be early before radiation fibrosis sets in or delayed. Reviewed variable take graft. She is happy with implant volume and position. Reviewed purpose fat grafting to aid with contour, prevent visible rippling. Reviewed some evidence and more current study of benefit fat grafting in radiated setting to treat skin changes. Reviewed variable take graft, fat necrosis that presents as lumps, may need to repeat Discussed donor site incisions, bruising, pain, need for compression garment for 4 -6 weeks. Ask that she invest in compression garment for use after surgery. Plan abdominal harvest. Plan OP surgery, no drains.   Inspira Smooth Round Extra Projection 560 ml implants placed bilateral. REF [id number]  Irene Limbo, MD [facility] Plastic [facility] Re[facility] Surgery [phone], pin [phone]

## 2017-10-25 ENCOUNTER — Encounter: Payer: Self-pay | Admitting: Radiation Oncology

## 2017-10-25 ENCOUNTER — Ambulatory Visit
Admission: RE | Admit: 2017-10-25 | Discharge: 2017-10-25 | Disposition: A | Discharge status: Home / Self Care | Payer: 59 | Source: Ambulatory Visit | Attending: Radiation Oncology | Admitting: Radiation Oncology

## 2017-10-25 VITALS — BP 113/70 | HR 86 | Temp 98.3°F | Resp 16 | Ht 66.0 in | Wt 150.8 lb

## 2017-10-25 DIAGNOSIS — Z880 Allergy status to penicillin: Secondary | ICD-10-CM | POA: Insufficient documentation

## 2017-10-25 DIAGNOSIS — Z79899 Other long term (current) drug therapy: Secondary | ICD-10-CM | POA: Diagnosis not present

## 2017-10-25 DIAGNOSIS — Z888 Allergy status to other drugs, medicaments and biological substances status: Secondary | ICD-10-CM | POA: Diagnosis not present

## 2017-10-25 DIAGNOSIS — C50912 Malignant neoplasm of unspecified site of left female breast: Secondary | ICD-10-CM | POA: Insufficient documentation

## 2017-10-25 DIAGNOSIS — C50911 Malignant neoplasm of unspecified site of right female breast: Secondary | ICD-10-CM | POA: Insufficient documentation

## 2017-10-25 DIAGNOSIS — Z170 Estrogen receptor positive status [ER+]: Secondary | ICD-10-CM | POA: Diagnosis not present

## 2017-10-25 DIAGNOSIS — Z9011 Acquired absence of right breast and nipple: Secondary | ICD-10-CM | POA: Insufficient documentation

## 2017-10-25 DIAGNOSIS — Z882 Allergy status to sulfonamides status: Secondary | ICD-10-CM | POA: Diagnosis not present

## 2017-10-25 DIAGNOSIS — C50812 Malignant neoplasm of overlapping 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 breast: Secondary | ICD-10-CM

## 2017-10-25 NOTE — Addendum Note (Signed)
Encounter addended by: Malena Edman, RN on: 10/25/2017 10:30 AM  Actions taken: Vitals modified

## 2017-10-25 NOTE — Addendum Note (Signed)
Encounter addended by: Malena Edman, RN on: 10/25/2017 10:39 AM  Actions taken: Charge Capture section accepted

## 2017-10-25 NOTE — Progress Notes (Signed)
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 This was delivered using a 3-D conformal, 4 field technique. The patient then received a boost to the mastectomy scar. This delivered an additional 10 Gy in 5 fractions using an en face electron field. The total dose was 60.4 Gy.   Narrative:  The patient returns today for routine follow-up. During treatment she did very well with radiotherapy but did have significant moist desquamation requiring gentian violet.  On review of systems, the patient states she is doing very well now that her skin has healed. She denies any concerns with healing, but has noticed some hyperpigmentation. She is now able to sleep comfortably since her skin has completely healed.  ALLERGIES:  is allergic to penicillins; sulfonamide derivatives; and adhesive [tape].  Meds: Current Outpatient Medications  Medication Sig Dispense Refill  . letrozole (FEMARA) 2.5 MG tablet Take 1 tablet (2.5 mg total) by mouth daily. 90 tablet 3  . lidocaine-prilocaine (EMLA) cream Apply to affected area once (Patient taking differently: Apply 1 application topically daily as needed (every 3 weeks prior to infusion.). Apply to affected area once) 30 g 3  . sertraline (ZOLOFT) 100 MG tablet Take 100 mg by mouth at bedtime.     Marland Kitchen acetaminophen (TYLENOL) 500 MG tablet Take 1,000 mg by mouth 2 (two) times daily as needed for  moderate pain (for pain/headaches.).     Marland Kitchen ibuprofen (ADVIL,MOTRIN) 200 MG tablet Take 400 mg by mouth 2 (two) times daily as needed for moderate pain (for pain/headaches).     . zolpidem (AMBIEN) 5 MG tablet Take 1 tablet (5 mg total) by mouth at bedtime as needed for sleep. (Patient not taking: Reported on 10/25/2017) 30 tablet 1   No current facility-administered medications for this encounter.     Physical Findings:  height is 5\' 6"  (1.676 m) and weight is 150 lb 12.8 oz (68.4 kg). Her oral temperature is 98.3 F (36.8 C). Her blood pressure is 113/70 and her pulse is 86. Her respiration is 16 and oxygen saturation is 100%.  Pain Assessment Pain Score: 0-No pain/10 In general this is a well appearing caucasian female in no acute distress. She's alert and oriented x4 and appropriate throughout the examination. Cardiopulmonary assessment is negative for acute distress and she exhibits normal effort. The right chest wall and reconstructed breast was examined and reveals mild hyperpigmentation, and a small eschar about 5 mm at the midline of her mastectomy scar. No desquamation is noted.   Lab Findings: Lab Results  Component Value Date   WBC 4.0 09/22/2017   HGB 11.2 (L) 09/22/2017   HCT 33.6 (L) 09/22/2017   MCV 93.1 09/22/2017   PLT 212 09/22/2017     Radiographic Findings: No results found.  Impression/Plan: 1. 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 The  patient has been doing well since completion of radiotherapy. We discussed that we would be happy to continue to follow her as needed, but she will also continue to follow up with Dr. Lindi Adie in medical oncology. She continues Herceptin, and was counseled on skin care as well as measures to avoid sun exposure to this area. She is also planning to have lipoinjections for her chest. She  is scheduled for this on 11/02/17, and Herceptin the following day.  2. Survivorship. We discussed the importance of survivorship evaluation and she is not, but at the appropriate time, will be  scheduled for this in the near future. She was also given the monthly calendar for access to resources offered within the cancer center.     Carola Rhine, PAC

## 2017-10-26 ENCOUNTER — Encounter (HOSPITAL_BASED_OUTPATIENT_CLINIC_OR_DEPARTMENT_OTHER): Payer: Self-pay | Admitting: *Deleted

## 2017-10-26 ENCOUNTER — Other Ambulatory Visit: Payer: Self-pay

## 2017-11-01 NOTE — Progress Notes (Signed)
Ensure pre surgery drink given with instructions to complete by 0930 dos, surgical soap given with instructions, pt verbalized understanding. 

## 2017-11-02 ENCOUNTER — Encounter (HOSPITAL_BASED_OUTPATIENT_CLINIC_OR_DEPARTMENT_OTHER): Payer: Self-pay

## 2017-11-02 ENCOUNTER — Encounter (HOSPITAL_BASED_OUTPATIENT_CLINIC_OR_DEPARTMENT_OTHER)
Admission: RE | Disposition: A | Discharge status: Home / Self Care | Payer: Self-pay | Source: Ambulatory Visit | Attending: Plastic Surgery

## 2017-11-02 ENCOUNTER — Other Ambulatory Visit: Payer: Self-pay

## 2017-11-02 ENCOUNTER — Ambulatory Visit (HOSPITAL_BASED_OUTPATIENT_CLINIC_OR_DEPARTMENT_OTHER): Payer: 59 | Admitting: Anesthesiology

## 2017-11-02 ENCOUNTER — Ambulatory Visit (HOSPITAL_BASED_OUTPATIENT_CLINIC_OR_DEPARTMENT_OTHER)
Admission: RE | Admit: 2017-11-02 | Discharge: 2017-11-02 | Disposition: A | Discharge status: Home / Self Care | Payer: 59 | Source: Ambulatory Visit | Attending: Plastic Surgery | Admitting: Plastic Surgery

## 2017-11-02 DIAGNOSIS — Z9013 Acquired absence of bilateral breasts and nipples: Secondary | ICD-10-CM | POA: Insufficient documentation

## 2017-11-02 DIAGNOSIS — Z853 Personal history of malignant neoplasm of breast: Secondary | ICD-10-CM | POA: Diagnosis not present

## 2017-11-02 DIAGNOSIS — Z421 Encounter for breast reconstruction following mastectomy: Secondary | ICD-10-CM | POA: Diagnosis not present

## 2017-11-02 DIAGNOSIS — F419 Anxiety disorder, unspecified: Secondary | ICD-10-CM | POA: Insufficient documentation

## 2017-11-02 DIAGNOSIS — Z923 Personal history of irradiation: Secondary | ICD-10-CM | POA: Diagnosis not present

## 2017-11-02 HISTORY — PX: LIPOSUCTION WITH LIPOFILLING: SHX6436

## 2017-11-02 SURGERY — LIPOSUCTION, WITH FAT TRANSFER
Anesthesia: General | Site: Chest | Laterality: Bilateral

## 2017-11-02 MED ORDER — CHLORHEXIDINE GLUCONATE CLOTH 2 % EX PADS: 6.0000 | MEDICATED_PAD | Freq: Once | CUTANEOUS | Status: DC

## 2017-11-02 MED ORDER — LACTATED RINGERS IV SOLN
INTRAVENOUS | Status: DC
  Administered 2017-11-02 (×2): via INTRAVENOUS

## 2017-11-02 MED ORDER — FENTANYL CITRATE (PF) 100 MCG/2ML IJ SOLN
INTRAMUSCULAR | Status: AC
  Filled 2017-11-02: qty 2

## 2017-11-02 MED ORDER — ACETAMINOPHEN 500 MG PO TABS
ORAL_TABLET | ORAL | Status: AC
  Filled 2017-11-02: qty 2

## 2017-11-02 MED ORDER — FENTANYL CITRATE (PF) 100 MCG/2ML IJ SOLN
25.0000 ug | INTRAMUSCULAR | Status: DC | PRN
  Administered 2017-11-02 (×2): 50 ug via INTRAVENOUS

## 2017-11-02 MED ORDER — EPHEDRINE SULFATE 50 MG/ML IJ SOLN
INTRAMUSCULAR | Status: DC | PRN
  Administered 2017-11-02: 10 mg via INTRAVENOUS

## 2017-11-02 MED ORDER — SCOPOLAMINE 1 MG/3DAYS TD PT72: 1.0000 | MEDICATED_PATCH | Freq: Once | TRANSDERMAL | Status: DC | PRN

## 2017-11-02 MED ORDER — ACETAMINOPHEN 325 MG PO TABS: 325.0000 mg | ORAL_TABLET | ORAL | Status: DC | PRN

## 2017-11-02 MED ORDER — LIDOCAINE 2% (20 MG/ML) 5 ML SYRINGE
INTRAMUSCULAR | Status: AC
  Filled 2017-11-02: qty 5

## 2017-11-02 MED ORDER — MIDAZOLAM HCL 2 MG/2ML IJ SOLN
INTRAMUSCULAR | Status: AC
  Filled 2017-11-02: qty 2

## 2017-11-02 MED ORDER — OXYCODONE HCL 5 MG/5ML PO SOLN: 5.0000 mg | Freq: Once | ORAL | Status: DC | PRN

## 2017-11-02 MED ORDER — CELECOXIB 200 MG PO CAPS
ORAL_CAPSULE | ORAL | Status: AC
  Filled 2017-11-02: qty 1

## 2017-11-02 MED ORDER — ACETAMINOPHEN 500 MG PO TABS
1000.0000 mg | ORAL_TABLET | ORAL | Status: AC
  Administered 2017-11-02: 1000 mg via ORAL

## 2017-11-02 MED ORDER — ONDANSETRON HCL 4 MG/2ML IJ SOLN: 4.0000 mg | Freq: Once | INTRAMUSCULAR | Status: DC | PRN

## 2017-11-02 MED ORDER — CELECOXIB 200 MG PO CAPS
200.0000 mg | ORAL_CAPSULE | ORAL | Status: AC
  Administered 2017-11-02: 200 mg via ORAL

## 2017-11-02 MED ORDER — ONDANSETRON HCL 4 MG/2ML IJ SOLN
INTRAMUSCULAR | Status: AC
  Filled 2017-11-02: qty 2

## 2017-11-02 MED ORDER — LIDOCAINE HCL (CARDIAC) PF 100 MG/5ML IV SOSY
PREFILLED_SYRINGE | INTRAVENOUS | Status: DC | PRN
Start: 2017-11-02 — End: 2017-11-02
  Administered 2017-11-02: 60 mg via INTRAVENOUS

## 2017-11-02 MED ORDER — PROPOFOL 10 MG/ML IV BOLUS
INTRAVENOUS | Status: DC | PRN
Start: 2017-11-02 — End: 2017-11-02
  Administered 2017-11-02: 20 mg via INTRAVENOUS
  Administered 2017-11-02: 150 mg via INTRAVENOUS

## 2017-11-02 MED ORDER — GABAPENTIN 300 MG PO CAPS
ORAL_CAPSULE | ORAL | Status: AC
  Filled 2017-11-02: qty 1

## 2017-11-02 MED ORDER — OXYCODONE HCL 5 MG PO TABS: 5.0000 mg | ORAL_TABLET | Freq: Four times a day (QID) | ORAL | 0 refills | Status: DC | PRN

## 2017-11-02 MED ORDER — DEXAMETHASONE SODIUM PHOSPHATE 4 MG/ML IJ SOLN
INTRAMUSCULAR | Status: DC | PRN
  Administered 2017-11-02: 10 mg via INTRAVENOUS

## 2017-11-02 MED ORDER — MIDAZOLAM HCL 2 MG/2ML IJ SOLN
1.0000 mg | INTRAMUSCULAR | Status: DC | PRN
  Administered 2017-11-02: 2 mg via INTRAVENOUS

## 2017-11-02 MED ORDER — SODIUM BICARBONATE 4 % IV SOLN
INTRAVENOUS | Status: DC | PRN
  Administered 2017-11-02: 400 mL via INTRAMUSCULAR

## 2017-11-02 MED ORDER — MEPERIDINE HCL 25 MG/ML IJ SOLN: 6.2500 mg | INTRAMUSCULAR | Status: DC | PRN

## 2017-11-02 MED ORDER — DEXAMETHASONE SODIUM PHOSPHATE 10 MG/ML IJ SOLN
INTRAMUSCULAR | Status: AC
  Filled 2017-11-02: qty 1

## 2017-11-02 MED ORDER — GABAPENTIN 300 MG PO CAPS
300.0000 mg | ORAL_CAPSULE | ORAL | Status: AC
  Administered 2017-11-02: 300 mg via ORAL

## 2017-11-02 MED ORDER — SUCCINYLCHOLINE CHLORIDE 20 MG/ML IJ SOLN
INTRAMUSCULAR | Status: DC | PRN
  Administered 2017-11-02: 30 mg via INTRAVENOUS

## 2017-11-02 MED ORDER — OXYCODONE HCL 5 MG PO TABS: 5.0000 mg | ORAL_TABLET | Freq: Once | ORAL | Status: DC | PRN

## 2017-11-02 MED ORDER — ACETAMINOPHEN 160 MG/5ML PO SOLN: 325.0000 mg | ORAL | Status: DC | PRN

## 2017-11-02 MED ORDER — CLINDAMYCIN PHOSPHATE 900 MG/50ML IV SOLN
INTRAVENOUS | Status: AC
Start: 2017-11-02 — End: ?
  Filled 2017-11-02: qty 50

## 2017-11-02 MED ORDER — PROPOFOL 10 MG/ML IV BOLUS
INTRAVENOUS | Status: AC
  Filled 2017-11-02: qty 20

## 2017-11-02 MED ORDER — FENTANYL CITRATE (PF) 100 MCG/2ML IJ SOLN
50.0000 ug | INTRAMUSCULAR | Status: AC | PRN
  Administered 2017-11-02: 50 ug via INTRAVENOUS
  Administered 2017-11-02: 100 ug via INTRAVENOUS
  Administered 2017-11-02: 50 ug via INTRAVENOUS

## 2017-11-02 MED ORDER — CLINDAMYCIN PHOSPHATE 900 MG/50ML IV SOLN
900.0000 mg | INTRAVENOUS | Status: AC
  Administered 2017-11-02: 900 mg via INTRAVENOUS

## 2017-11-02 SURGICAL SUPPLY — 56 items
BINDER ABDOMINAL 10 UNV 27-48 (MISCELLANEOUS) ×2 IMPLANT
BINDER ABDOMINAL 12 SM 30-45 (SOFTGOODS) IMPLANT
BINDER BREAST LRG (GAUZE/BANDAGES/DRESSINGS) IMPLANT
BINDER BREAST MEDIUM (GAUZE/BANDAGES/DRESSINGS) IMPLANT
BINDER BREAST XLRG (GAUZE/BANDAGES/DRESSINGS) IMPLANT
BINDER BREAST XXLRG (GAUZE/BANDAGES/DRESSINGS) IMPLANT
BLADE SURG 10 STRL SS (BLADE) IMPLANT
BLADE SURG 11 STRL SS (BLADE) ×2 IMPLANT
BNDG GAUZE ELAST 4 BULKY (GAUZE/BANDAGES/DRESSINGS) IMPLANT
CANISTER LIPO FAT HARVEST (MISCELLANEOUS) ×2 IMPLANT
CANISTER SUCT 1200ML W/VALVE (MISCELLANEOUS) IMPLANT
CHLORAPREP W/TINT 26ML (MISCELLANEOUS) ×4 IMPLANT
COVER BACK TABLE 60X90IN (DRAPES) ×2 IMPLANT
COVER MAYO STAND STRL (DRAPES) ×2 IMPLANT
DECANTER SPIKE VIAL GLASS SM (MISCELLANEOUS) IMPLANT
DERMABOND ADVANCED (GAUZE/BANDAGES/DRESSINGS) ×1
DERMABOND ADVANCED .7 DNX12 (GAUZE/BANDAGES/DRESSINGS) ×1 IMPLANT
DRAPE TOP ARMCOVERS (MISCELLANEOUS) ×2 IMPLANT
DRAPE U-SHAPE 76X120 STRL (DRAPES) ×2 IMPLANT
DRSG PAD ABDOMINAL 8X10 ST (GAUZE/BANDAGES/DRESSINGS) ×4 IMPLANT
ELECT COATED BLADE 2.86 ST (ELECTRODE) IMPLANT
ELECT REM PT RETURN 9FT ADLT (ELECTROSURGICAL) ×2
ELECTRODE REM PT RTRN 9FT ADLT (ELECTROSURGICAL) ×1 IMPLANT
GLOVE BIO SURGEON STRL SZ 6 (GLOVE) ×2 IMPLANT
GLOVE EXAM NITRILE MD LF STRL (GLOVE) ×2 IMPLANT
GLOVE SURG SS PI 7.0 STRL IVOR (GLOVE) ×2 IMPLANT
GOWN STRL REUS W/ TWL LRG LVL3 (GOWN DISPOSABLE) ×2 IMPLANT
GOWN STRL REUS W/TWL LRG LVL3 (GOWN DISPOSABLE) ×2
LINER CANISTER 1000CC FLEX (MISCELLANEOUS) ×2 IMPLANT
NDL SAFETY ECLIPSE 18X1.5 (NEEDLE) ×1 IMPLANT
NEEDLE HYPO 18GX1.5 SHARP (NEEDLE) ×1
NS IRRIG 1000ML POUR BTL (IV SOLUTION) ×2 IMPLANT
PACK BASIN DAY SURGERY FS (CUSTOM PROCEDURE TRAY) ×2 IMPLANT
PAD ALCOHOL SWAB (MISCELLANEOUS) ×2 IMPLANT
PENCIL BUTTON HOLSTER BLD 10FT (ELECTRODE) IMPLANT
SHEET MEDIUM DRAPE 40X70 STRL (DRAPES) ×4 IMPLANT
SLEEVE SCD COMPRESS KNEE MED (MISCELLANEOUS) ×2 IMPLANT
SPONGE LAP 18X18 RF (DISPOSABLE) ×4 IMPLANT
STAPLER VISISTAT 35W (STAPLE) ×2 IMPLANT
SUT MNCRL AB 4-0 PS2 18 (SUTURE) ×2 IMPLANT
SUT VIC AB 3-0 PS1 18 (SUTURE)
SUT VIC AB 3-0 PS1 18XBRD (SUTURE) IMPLANT
SUT VIC AB 3-0 SH 27 (SUTURE)
SUT VIC AB 3-0 SH 27X BRD (SUTURE) IMPLANT
SUT VICRYL 4-0 PS2 18IN ABS (SUTURE) IMPLANT
SYR 10ML LL (SYRINGE) ×8 IMPLANT
SYR 50ML LL SCALE MARK (SYRINGE) ×8 IMPLANT
SYR BULB IRRIGATION 50ML (SYRINGE) ×2 IMPLANT
SYR CONTROL 10ML LL (SYRINGE) IMPLANT
SYR TB 1ML LL NO SAFETY (SYRINGE) IMPLANT
TOWEL GREEN STERILE FF (TOWEL DISPOSABLE) ×4 IMPLANT
TUBE CONNECTING 20X1/4 (TUBING) ×2 IMPLANT
TUBING INFILTRATION IT-10001 (TUBING) ×2 IMPLANT
TUBING SET GRADUATE ASPIR 12FT (MISCELLANEOUS) ×2 IMPLANT
UNDERPAD 30X30 (UNDERPADS AND DIAPERS) ×4 IMPLANT
YANKAUER SUCT BULB TIP NO VENT (SUCTIONS) IMPLANT

## 2017-11-02 NOTE — Anesthesia Preprocedure Evaluation (Signed)
Anesthesia Evaluation  Patient identified by MRN, date of birth, ID band Patient awake    Reviewed: Allergy & Precautions, NPO status , Patient's Chart, lab work & pertinent test results  History of Anesthesia Complications (+) DIFFICULT AIRWAY and history of anesthetic complications  Airway Mallampati: I       Dental no notable dental hx. (+) Teeth Intact   Pulmonary neg pulmonary ROS,    Pulmonary exam normal breath sounds clear to auscultation       Cardiovascular negative cardio ROS Normal cardiovascular exam Rhythm:Regular Rate:Normal     Neuro/Psych PSYCHIATRIC DISORDERS Anxiety negative neurological ROS     GI/Hepatic negative GI ROS, Neg liver ROS,   Endo/Other  negative endocrine ROS  Renal/GU negative Renal ROS  negative genitourinary   Musculoskeletal negative musculoskeletal ROS (+)   Abdominal Normal abdominal exam  (+)   Peds  Hematology  (+) Blood dyscrasia, anemia ,   Anesthesia Other Findings   Reproductive/Obstetrics                             Anesthesia Physical  Anesthesia Plan  ASA: II  Anesthesia Plan: General   Post-op Pain Management:    Induction: Intravenous  PONV Risk Score and Plan: 3 and Ondansetron and Dexamethasone  Airway Management Planned: LMA  Additional Equipment:   Intra-op Plan:   Post-operative Plan:   Informed Consent: I have reviewed the patients History and Physical, chart, labs and discussed the procedure including the risks, benefits and alternatives for the proposed anesthesia with the patient or authorized representative who has indicated his/her understanding and acceptance.     Plan Discussed with: CRNA and Surgeon  Anesthesia Plan Comments:         Anesthesia Quick Evaluation

## 2017-11-02 NOTE — Anesthesia Procedure Notes (Signed)
Procedure Name: LMA Insertion Date/Time: 11/02/2017 9:35 AM Performed by: Maryella Shivers, CRNA Pre-anesthesia Checklist: Patient identified, Emergency Drugs available, Suction available and Patient being monitored Patient Re-evaluated:Patient Re-evaluated prior to induction Oxygen Delivery Method: Circle system utilized Preoxygenation: Pre-oxygenation with 100% oxygen Induction Type: IV induction Ventilation: Mask ventilation without difficulty LMA: LMA inserted LMA Size: 4.0 Number of attempts: 1 Airway Equipment and Method: Bite block Placement Confirmation: positive ETCO2 Tube secured with: Tape Dental Injury: Teeth and Oropharynx as per pre-operative assessment

## 2017-11-02 NOTE — Discharge Instructions (Signed)

## 2017-11-02 NOTE — Anesthesia Postprocedure Evaluation (Signed)
Anesthesia Post Note  Patient: Amanda Williams  Procedure(s) Performed: lipofilling from abdomen to bilateral chest (Bilateral Chest)     Patient location during evaluation: PACU Anesthesia Type: General Level of consciousness: awake and alert and oriented Pain management: pain level controlled Vital Signs Assessment: post-procedure vital signs reviewed and stable Respiratory status: spontaneous breathing, nonlabored ventilation and respiratory function stable Cardiovascular status: blood pressure returned to baseline and stable Postop Assessment: no apparent nausea or vomiting Anesthetic complications: no    Last Vitals:  Vitals:   11/02/17 1045 11/02/17 1130  BP: 112/76 135/80  Pulse: 85 82  Resp: 18 18  Temp:  37.1 C  SpO2: 99% 95%    Last Pain:  Vitals:   11/02/17 1130  TempSrc:   PainSc: 4                  [name]

## 2017-11-02 NOTE — Op Note (Signed)
Operative Note   DATE OF OPERATION: 8.13.19  LOCATION: [facility]-outpatient  SURGICAL DIVISION: Plastic Surgery  PREOPERATIVE DIAGNOSES:  1. History breast cancer 2. Acquired absence breasts 3. History therapeutic radiation   POSTOPERATIVE DIAGNOSES:  same  PROCEDURE:  1. Lipofilling from abdomen to bilateral chest   SURGEON: Irene Limbo MD MBA  ASSISTANT: none  ANESTHESIA:  General.   EBL: 20 ml  COMPLICATIONS: None immediate.   INDICATIONS FOR PROCEDURE:  The patient is here for fat grafting to bilateral chest following bilateral mastectomies and prepectoral implant based reconstruction. She completed radiation to right chest 7.3.19.  FINDINGS: 95 ml fat infiltrated over right reconstruction and axilla, 60 ml fat over left chest and axilla.  DESCRIPTION OF PROCEDURE:  The patient's operative site was marked with the patient in the preoperative area to mark desired areas for grafting over bilateral chest.Supra and infraumbilcal abdomen marked for liposuction.The patientwas taken to the operating room. SCDs were placed and IV antibiotics were given. The patient's operative site was prepped and draped in a sterile fashion. A time out was performed and all information was confirmed to be correct.  Stab incision made over bilateralabdomenand tumescent fluid infiltrated over supra and infraumbilical [id number] tumescent infiltrated. Power assisted liposuction performed to endpoint symmetric contour and soft tissue thickness, total lipoaspirate429ml. The fat was then washed and prepared by gravity for infiltration. Harvested fat was then infiltrated in subcutaneous plane over bilateral chest entire mastectomy flap and axillae, lateral chest walls. This was completed with blunt canula through stab incision lateral superior chest and lower outer quadrant chest bilateral.  Chest incisions closed with simple 4-0 monocryl.  Dermabond  applied to chest incisions. Abdomen incisions approximated with simple 4-0 monocryl stitch.Dry dressing applied, followed by abdominal binder.  The patient was allowed to wake from anesthesia, extubated and taken to the recovery room in satisfactory condition.   SPECIMENS: none  DRAINS: none  Irene Limbo, MD [facility] Plastic & Reconstructive Surgery [phone], pin [phone]

## 2017-11-02 NOTE — Interval H&P Note (Signed)
History and Physical Interval Note:  11/02/2017 9:02 AM  Amanda Williams  has presented today for surgery, with the diagnosis of history breast cancer, acquired absence breasts, history therapeutic radiation  The various methods of treatment have been discussed with the patient and family. After consideration of risks, benefits and other options for treatment, the patient has consented to  Procedure(s): lipofilling from abdomen to bilateral chest (Bilateral) as a surgical intervention .  The patient's history has been reviewed, patient examined, no change in status, stable for surgery.  I have reviewed the patient's chart and labs.  Questions were answered to the patient's satisfaction.     Kohei Antonellis

## 2017-11-02 NOTE — Transfer of Care (Signed)
Immediate Anesthesia Transfer of Care Note  Patient: Amanda Williams  Procedure(s) Performed: lipofilling from abdomen to bilateral chest (Bilateral Chest)  Patient Location: PACU  Anesthesia Type:General  Level of Consciousness: awake, alert  and oriented  Airway & Oxygen Therapy: Patient Spontanous Breathing and Patient connected to face mask oxygen  Post-op Assessment: Report given to RN and Post -op Vital signs reviewed and stable  Post vital signs: Reviewed and stable  Last Vitals:  Vitals Value Taken Time  BP 127/93 11/02/2017 10:30 AM  Temp    Pulse 86 11/02/2017 10:30 AM  Resp 17 11/02/2017 10:30 AM  SpO2 100 % 11/02/2017 10:30 AM    Last Pain:  Vitals:   11/02/17 0910  TempSrc: Oral  PainSc: 0-No pain         Complications: No apparent anesthesia complications

## 2017-11-03 ENCOUNTER — Inpatient Hospital Stay (HOSPITAL_BASED_OUTPATIENT_CLINIC_OR_DEPARTMENT_OTHER): Payer: 59 | Admitting: Hematology and Oncology

## 2017-11-03 ENCOUNTER — Inpatient Hospital Stay: Payer: 59 | Attending: Hematology and Oncology

## 2017-11-03 ENCOUNTER — Inpatient Hospital Stay: Payer: 59

## 2017-11-03 ENCOUNTER — Encounter (HOSPITAL_BASED_OUTPATIENT_CLINIC_OR_DEPARTMENT_OTHER): Payer: Self-pay | Admitting: Plastic Surgery

## 2017-11-03 DIAGNOSIS — C50811 Malignant neoplasm of overlapping sites of right female breast: Secondary | ICD-10-CM

## 2017-11-03 DIAGNOSIS — C50011 Malignant neoplasm of nipple and areola, right female breast: Secondary | ICD-10-CM

## 2017-11-03 DIAGNOSIS — C773 Secondary and unspecified malignant neoplasm of axilla and upper limb lymph nodes: Secondary | ICD-10-CM | POA: Diagnosis not present

## 2017-11-03 DIAGNOSIS — Z9011 Acquired absence of right breast and nipple: Secondary | ICD-10-CM | POA: Diagnosis not present

## 2017-11-03 DIAGNOSIS — C50012 Malignant neoplasm of nipple and areola, left female breast: Secondary | ICD-10-CM

## 2017-11-03 DIAGNOSIS — Z79811 Long term (current) use of aromatase inhibitors: Secondary | ICD-10-CM | POA: Insufficient documentation

## 2017-11-03 DIAGNOSIS — Z5112 Encounter for antineoplastic immunotherapy: Secondary | ICD-10-CM | POA: Insufficient documentation

## 2017-11-03 DIAGNOSIS — C50812 Malignant neoplasm of overlapping 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 breast: Secondary | ICD-10-CM

## 2017-11-03 DIAGNOSIS — Z9221 Personal history of antineoplastic chemotherapy: Secondary | ICD-10-CM | POA: Insufficient documentation

## 2017-11-03 DIAGNOSIS — Z923 Personal history of irradiation: Secondary | ICD-10-CM | POA: Insufficient documentation

## 2017-11-03 DIAGNOSIS — Z170 Estrogen receptor positive status [ER+]: Secondary | ICD-10-CM | POA: Insufficient documentation

## 2017-11-03 LAB — CMP (CANCER CENTER ONLY)
ALBUMIN: 3.4 g/dL — AB (ref 3.5–5.0)
ALK PHOS: 108 U/L (ref 38–126)
ALT: 15 U/L (ref 0–44)
AST: 13 U/L — AB (ref 15–41)
Anion gap: 13 (ref 5–15)
BILIRUBIN TOTAL: 0.3 mg/dL (ref 0.3–1.2)
BUN: 12 mg/dL (ref 6–20)
CALCIUM: 8.9 mg/dL (ref 8.9–10.3)
CO2: 22 mmol/L (ref 22–32)
CREATININE: 0.86 mg/dL (ref 0.44–1.00)
Chloride: 105 mmol/L (ref 98–111)
GFR, Est AFR Am: >60 mL/min (ref >60)
GFR, Estimated: >60 mL/min (ref >60)
GLUCOSE: 209 mg/dL — AB (ref 70–99)
Potassium: 3.6 mmol/L (ref 3.5–5.1)
Sodium: 140 mmol/L (ref 135–145)
TOTAL PROTEIN: 6.9 g/dL (ref 6.5–8.1)

## 2017-11-03 LAB — CBC WITH DIFFERENTIAL (CANCER CENTER ONLY)
BASOS ABS: 0 10*3/uL (ref 0.0–0.1)
BASOS PCT: 0 %
Eosinophils Absolute: 0 10*3/uL (ref 0.0–0.5)
Eosinophils Relative: 0 %
HEMATOCRIT: 32.1 % — AB (ref 34.8–46.6)
HEMOGLOBIN: 10.7 g/dL — AB (ref 11.6–15.9)
Lymphocytes Relative: 5 %
Lymphs Abs: 0.5 10*3/uL — ABNORMAL LOW (ref 0.9–3.3)
MCH: 30.1 pg (ref 25.1–34.0)
MCHC: 33.4 g/dL (ref 31.5–36.0)
MCV: 90.2 fL (ref 79.5–101.0)
MONOS PCT: 4 %
Monocytes Absolute: 0.4 10*3/uL (ref 0.1–0.9)
NEUTROS ABS: 9.3 10*3/uL — AB (ref 1.5–6.5)
NEUTROS PCT: 91 %
Platelet Count: 267 10*3/uL (ref 145–400)
RBC: 3.56 MIL/uL — AB (ref 3.70–5.45)
RDW: 13.5 % (ref 11.2–14.5)
WBC: 10.1 10*3/uL (ref 3.9–10.3)

## 2017-11-03 MED ORDER — SODIUM CHLORIDE 0.9% FLUSH
10.0000 mL | Freq: Once | INTRAVENOUS | Status: AC
  Administered 2017-11-03: 10 mL
  Filled 2017-11-03: qty 10

## 2017-11-03 MED ORDER — LIDOCAINE-PRILOCAINE 2.5-2.5 % EX CREA: TOPICAL_CREAM | CUTANEOUS | 0 refills | Status: DC

## 2017-11-03 MED ORDER — SODIUM CHLORIDE 0.9% FLUSH
10.0000 mL | INTRAVENOUS | Status: DC | PRN
  Administered 2017-11-03: 10 mL
  Filled 2017-11-03: qty 10

## 2017-11-03 MED ORDER — ACETAMINOPHEN 325 MG PO TABS
ORAL_TABLET | ORAL | Status: AC
  Filled 2017-11-03: qty 2

## 2017-11-03 MED ORDER — ACETAMINOPHEN 325 MG PO TABS
650.0000 mg | ORAL_TABLET | Freq: Once | ORAL | Status: AC
  Administered 2017-11-03: 650 mg via ORAL

## 2017-11-03 MED ORDER — TRASTUZUMAB CHEMO 150 MG IV SOLR
6.0000 mg/kg | Freq: Once | INTRAVENOUS | Status: AC
  Administered 2017-11-03: 399 mg via INTRAVENOUS
  Filled 2017-11-03: qty 19

## 2017-11-03 MED ORDER — SODIUM CHLORIDE 0.9 % IV SOLN
Freq: Once | INTRAVENOUS | Status: AC
  Administered 2017-11-03: 11:00:00 via INTRAVENOUS
  Filled 2017-11-03: qty 250

## 2017-11-03 MED ORDER — DIPHENHYDRAMINE HCL 25 MG PO CAPS: 25.0000 mg | ORAL_CAPSULE | Freq: Once | ORAL | Status: DC

## 2017-11-03 MED ORDER — HEPARIN SOD (PORK) LOCK FLUSH 100 UNIT/ML IV SOLN
500.0000 [IU] | Freq: Once | INTRAVENOUS | Status: AC | PRN
  Administered 2017-11-03: 500 [IU]
  Filled 2017-11-03: qty 5

## 2017-11-03 NOTE — Progress Notes (Signed)
Patient Care Team: Panosh, Standley Brooking, MD as PCP - General Delsa Bern, MD (Obstetrics and Gynecology) Excell Seltzer, MD as Consulting Physician (General Surgery) Nicholas Lose, MD as Consulting Physician (Hematology and Oncology) Kyung Rudd, MD as Consulting Physician (Radiation Oncology)  DIAGNOSIS:  Encounter Diagnoses  Name Primary?  . Malignant neoplasm of overlapping sites of right breast in female, estrogen receptor positive ([place])   . Malignant neoplasm involving both nipple and areola in both breasts in female, estrogen receptor positive ([place])     SUMMARY OF ONCOLOGIC HISTORY:   Malignant neoplasm of overlapping 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 breast ([place])   01/12/2017 Surgery    Left mastectomy: IDC with DCIS, 2 tumors 3.6 cm ER 90%, PR 95%, Ki-67 5%, HER-2 negative ratio 1.32) and 1.2 cm (ER 95% PR 95% positive Her 2 Positive ratio 3.2, Ki-67 5%) T2 N0 stage Ib    02/16/2017 -  Chemotherapy    TCH x6 followed by Herceptin maintenance      Malignant neoplasm of overlapping sites of right breast in female, estrogen receptor positive ([place])   12/21/2016 Initial Diagnosis    Palpable lumps in the right breast with nipple inversion: Mammogram revealed skin thickening and distortion, ultrasound revealed 4 masses 3.1 cm at 11:00, 2.5 cm at 6:00, 4 cm at 8:00, 2.2 cm at 5:00 with 2 abnormal axillary lymph nodes: MRI revealed 7 cm abnormality on right breast, in addition 3.6 cm abnormality in the left breast and 2 additional masses 1 cm each; right breast: T4 N1 stage III B clinical stage; left breast: T2 N0 stage IB clinical stage    12/21/2016 Pathology Results    Right breast: Grade 1 Invasive lobular cancer, lymph node also positive, ER 70%, PR 90%, Ki-67 10%, HER-2 negative ratio 1.09 Left breast: Grade 1 IDC with DCIS prognostic panel pending    01/04/2017 Genetic Testing    The patient had genetic testing due to a personal history of bilateral breast caner and a family history  of breast cancer.  The Multi-Cancer Panel was ordered. The Multi-Cancer Panel offered by [facility] includes sequencing and/or deletion duplication testing of the following 83 genes: ALK, APC, ATM, AXIN2,BAP1,  BARD1, BLM, BMPR1A, BRCA1, BRCA2, BRIP1, CASR, CDC73, CDH1, CDK4, CDKN1B, CDKN1C, CDKN2A (p14ARF), CDKN2A (p16INK4a), CEBPA, CHEK2, CTNNA1, DICER1, DIS3L2, EGFR (c.2369C>T, p.Thr790Met variant only), EPCAM (Deletion/duplication testing only), FH, FLCN, GATA2, GPC3, GREM1 (Promoter region deletion/duplication testing only), HOXB13 (c.251G>A, p.Gly84Glu), HRAS, KIT, MAX, MEN1, MET, MITF (c.952G>A, p.Glu318Lys variant only), MLH1, MSH2, MSH3, MSH6, MUTYH, NBN, NF1, NF2, NTHL1, PALB2, PDGFRA, PHOX2B, PMS2, POLD1, POLE, POT1, PRKAR1A, PTCH1, PTEN, RAD50, RAD51C, RAD51D, RB1, RECQL4, RET, RUNX1, SDHAF2, SDHA (sequence changes only), SDHB, SDHC, SDHD, SMAD4, SMARCA4, SMARCB1, SMARCE1, STK11, SUFU, TERC, TERT, TMEM127, TP53, TSC1, TSC2, VHL, WRN and WT1.   Results: Negative, no pathogenic variants identified.  The date of this test report is 01/04/2017.      01/12/2017 Surgery    Right mastectomy: Invasive and in situ lobular carcinoma involves the dermis of the skin of the nipple, margins negative, 3/3 lymph nodes positive, grade 1, EF 75%, PR 95%, HER-2 negative ratio 1.19, Ki-67 10%, T3N1A stage IIa    02/16/2017 - 06/09/2017 Chemotherapy    TCH x6 cycles followed by Herceptin maintenance    08/10/2017 - 09/22/2017 Radiation Therapy    Adjuvant radiation    09/24/2017 -  Anti-estrogen oral therapy    Letrozole 2.5 mg daily     CHIEF COMPLIANT: Follow-up on Herceptin and letrozole  INTERVAL HISTORY: Amanda Williams is a 57-year with above-mentioned history of right breast cancer treated with mastectomy followed by adjuvant chemotherapy and is currently on Herceptin treatment.  She appears to be tolerating Herceptin extremely well.  She does not have any side effects.  She was started on letrozole therapy  when she was found to be menopausal.  She appears to be tolerating letrozole as well fairly well.  Recently she had a light pole suction followed by fat grafting which she did very well.  REVIEW OF SYSTEMS:   Constitutional: Denies fevers, chills or abnormal weight loss Eyes: Denies blurriness of vision Ears, nose, mouth, throat, and face: Denies mucositis or sore throat Respiratory: Denies cough, dyspnea or wheezes Cardiovascular: Denies palpitation, chest discomfort Gastrointestinal:  Denies nausea, heartburn or change in bowel habits Skin: Denies abnormal skin rashes Lymphatics: Denies new lymphadenopathy or easy bruising Neurological:Denies numbness, tingling or new weaknesses Behavioral/Psych: Mood is stable, no new changes  Extremities: No lower extremity edema  All other systems were reviewed with the patient and are negative.  I have reviewed the past medical history, past surgical history, social history and family history with the patient and they are unchanged from previous note.  ALLERGIES:  is allergic to penicillins; sulfonamide derivatives; and adhesive [tape].  MEDICATIONS:  Current Outpatient Medications  Medication Sig Dispense Refill  . acetaminophen (TYLENOL) 500 MG tablet Take 1,000 mg by mouth 2 (two) times daily as needed for moderate pain (for pain/headaches.).     Marland Kitchen ibuprofen (ADVIL,MOTRIN) 200 MG tablet Take 400 mg by mouth 2 (two) times daily as needed for moderate pain (for pain/headaches).     Marland Kitchen letrozole (FEMARA) 2.5 MG tablet Take 1 tablet (2.5 mg total) by mouth daily. 90 tablet 3  . lidocaine-prilocaine (EMLA) cream Apply to affected area once 30 g 0  . oxyCODONE (ROXICODONE) 5 MG immediate release tablet Take 1 tablet (5 mg total) by mouth every 6 (six) hours as needed. 20 tablet 0  . sertraline (ZOLOFT) 100 MG tablet Take 100 mg by mouth at bedtime.      No current facility-administered medications for this visit.    Facility-Administered  Medications Ordered in Other Visits  Medication Dose Route Frequency Provider Last Rate Last Dose  . 0.9 %  sodium chloride infusion   Intravenous Once Nicholas Lose, MD      . acetaminophen (TYLENOL) tablet 650 mg  650 mg Oral Once Nicholas Lose, MD      . diphenhydrAMINE (BENADRYL) capsule 25 mg  25 mg Oral Once Nicholas Lose, MD      . heparin lock flush 100 unit/mL  500 Units Intracatheter Once PRN Nicholas Lose, MD      . sodium chloride flush (NS) 0.9 % injection 10 mL  10 mL Intracatheter PRN Nicholas Lose, MD      . trastuzumab (HERCEPTIN) 399 mg in sodium chloride 0.9 % 250 mL chemo infusion  6 mg/kg (Treatment Plan Recorded) Intravenous Once Nicholas Lose, MD        PHYSICAL EXAMINATION: ECOG PERFORMANCE STATUS: 1 - Symptomatic but completely ambulatory  Vitals:   11/03/17 1030  BP: 123/74  Pulse: 72  Resp: 18  Temp: 98.1 F (36.7 C)  SpO2: 100%   Filed Weights   11/03/17 1030  Weight: 152 lb 11.2 oz (69.3 kg)    GENERAL:alert, no distress and comfortable SKIN: skin color, texture, turgor are normal, no rashes or significant lesions EYES: normal, Conjunctiva are pink and non-injected, sclera clear  OROPHARYNX:no exudate, no erythema and lips, buccal mucosa, and tongue normal  NECK: supple, thyroid normal size, non-tender, without nodularity LYMPH:  no palpable lymphadenopathy in the cervical, axillary or inguinal LUNGS: clear to auscultation and percussion with normal breathing effort HEART: regular rate & rhythm and no murmurs and no lower extremity edema ABDOMEN:abdomen soft, non-tender and normal bowel sounds MUSCULOSKELETAL:no cyanosis of digits and no clubbing  NEURO: alert & oriented x 3 with fluent speech, no focal motor/sensory deficits EXTREMITIES: No lower extremity edema  LABORATORY DATA:  I have reviewed the data as listed CMP Latest Ref Rng & Units 09/22/2017 08/11/2017 06/30/2017  Glucose 70 - 99 mg/dL 147(H) 130 129  BUN 6 - 20 mg/dL 13 12 16     Creatinine 0.44 - 1.00 mg/dL 0.95 0.89 0.87  Sodium 135 - 145 mmol/L 137 141 141  Potassium 3.5 - 5.1 mmol/L 3.8 3.7 3.7  Chloride 98 - 111 mmol/L 101 106 106  CO2 22 - 32 mmol/L 27 25 25   Calcium 8.9 - 10.3 mg/dL 9.2 9.2 9.2  Total Protein 6.5 - 8.1 g/dL 7.0 7.2 7.1  Total Bilirubin 0.3 - 1.2 mg/dL 0.3 0.4 0.4  Alkaline Phos 38 - 126 U/L 99 87 84  AST 15 - 41 U/L 19 24 29   ALT 0 - 44 U/L 16 21 31     Lab Results  Component Value Date   WBC 10.1 11/03/2017   HGB 10.7 (L) 11/03/2017   HCT 32.1 (L) 11/03/2017   MCV 90.2 11/03/2017   PLT 267 11/03/2017   NEUTROABS 9.3 (H) 11/03/2017    ASSESSMENT & PLAN:  Malignant neoplasm of overlapping sites of right breast in female, estrogen receptor positive ([place]) 01/12/2017:Left mastectomy: IDC with DCIS, 2 tumors 3.6 cm ER 90%, PR 95%, Ki-67 5%, HER-2 negative ratio 1.32) and 1.2 cm (ER 95% PR 95% positive Her 2 Positive ratio 3.2, Ki-67 5%) T2 N0 stage Ib Right mastectomy: Invasive and in situ lobular carcinoma involves the dermis of the skin of the nipple, margins negative, 3/3 lymph nodes positive, grade 1, ER 75%, PR 95%, HER-2 negative ratio 1.19, Ki-67 10%, T3N1A stage IIa  Treatment plan: 1. Adjuvant chemotherapy with TCH x 4 cycles completed on 06/09/2017 (Taxotere held from final 2 cycles)followed by Herceptin maintenance for 1 year 2. Adjuvant radiation therapystarted 08/10/2017-09/22/2017 3. Followed by adjuvant antiestrogen therapy __________________________________________________________________________________________ Echo on3/27/2019: 55% - 60% Current treatment:Herceptin maintenance to be completed November 2019 Herceptin toxicities: None  Current treatment: Letrozole 2.5 mg daily started 09/24/2017  Letrozole toxicities: Denies any hot flashes  Breast cancer surveillance: Mammograms will need to be performed on the right breast September 2019  Return to clinic in every 3 weeks for Herceptin every 6 weeks for  follow-up with me.    No orders of the defined types were placed in this encounter.  The patient has a good understanding of the overall plan. she agrees with it. she will call with any problems that may develop before the next visit here.   Harriette Ohara, MD 11/03/17

## 2017-11-03 NOTE — Assessment & Plan Note (Signed)
01/12/2017:Left mastectomy: IDC with DCIS, 2 tumors 3.6 cm ER 90%, PR 95%, Ki-67 5%, HER-2 negative ratio 1.32) and 1.2 cm (ER 95% PR 95% positive Her 2 Positive ratio 3.2, Ki-67 5%) T2 N0 stage Ib Right mastectomy: Invasive and in situ lobular carcinoma involves the dermis of the skin of the nipple, margins negative, 3/3 lymph nodes positive, grade 1, ER 75%, PR 95%, HER-2 negative ratio 1.19, Ki-67 10%, T3N1A stage IIa  Treatment plan: 1. Adjuvant chemotherapy with TCH x 4 cycles completed on 06/09/2017 (Taxotere held from final 2 cycles)followed by Herceptin maintenance for 1 year 2. Adjuvant radiation therapystarted 08/10/2017-09/22/2017 3. Followed by adjuvant antiestrogen therapy __________________________________________________________________________________________ Echo on3/27/2019: 55% - 60% Current treatment:Herceptin maintenance and radiation Herceptin toxicities: None  Current treatment: Letrozole 2.5 mg daily started 09/24/2017  Letrozole toxicities:  Breast cancer surveillance: Mammograms will need to be performed on the right breast September 2019  Return to clinic in 6 months for follow-up and after that we can see her once a year

## 2017-11-03 NOTE — Patient Instructions (Signed)
[facility] Discharge Instructions for Patients Receiving Chemotherapy  Today you received the following chemotherapy agents: Trastuzumab (Herceptin).  To help prevent nausea and vomiting after your treatment, we encourage you to take your nausea medication as prescribed. If you develop nausea and vomiting that is not controlled by your nausea medication, call the clinic.   BELOW ARE SYMPTOMS THAT SHOULD BE REPORTED IMMEDIATELY:  *FEVER GREATER THAN 100.5 F  *CHILLS WITH OR WITHOUT FEVER  NAUSEA AND VOMITING THAT IS NOT CONTROLLED WITH YOUR NAUSEA MEDICATION  *UNUSUAL SHORTNESS OF BREATH  *UNUSUAL BRUISING OR BLEEDING  TENDERNESS IN MOUTH AND THROAT WITH OR WITHOUT PRESENCE OF ULCERS  *URINARY PROBLEMS  *BOWEL PROBLEMS  UNUSUAL RASH Items with * indicate a potential emergency and should be followed up as soon as possible.  Feel free to call the clinic should you have any questions or concerns. The clinic phone number is [phone].  Please show the CHEMO ALERT CARD at check-in to the Emergency Department and triage nurse.   

## 2017-11-24 ENCOUNTER — Inpatient Hospital Stay: Payer: 59 | Attending: Hematology and Oncology

## 2017-11-24 VITALS — BP 111/65 | HR 71 | Temp 98.6°F | Resp 18

## 2017-11-24 DIAGNOSIS — C50011 Malignant neoplasm of nipple and areola, right female breast: Secondary | ICD-10-CM

## 2017-11-24 DIAGNOSIS — Z79899 Other long term (current) drug therapy: Secondary | ICD-10-CM | POA: Insufficient documentation

## 2017-11-24 DIAGNOSIS — Z170 Estrogen receptor positive status [ER+]: Secondary | ICD-10-CM | POA: Insufficient documentation

## 2017-11-24 DIAGNOSIS — Z5112 Encounter for antineoplastic immunotherapy: Secondary | ICD-10-CM | POA: Insufficient documentation

## 2017-11-24 DIAGNOSIS — C773 Secondary and unspecified malignant neoplasm of axilla and upper limb lymph nodes: Secondary | ICD-10-CM | POA: Insufficient documentation

## 2017-11-24 DIAGNOSIS — C50811 Malignant neoplasm of overlapping sites of right female breast: Secondary | ICD-10-CM | POA: Insufficient documentation

## 2017-11-24 DIAGNOSIS — C50812 Malignant neoplasm of overlapping 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 breast: Secondary | ICD-10-CM | POA: Diagnosis not present

## 2017-11-24 DIAGNOSIS — Z9012 Acquired absence of left breast and nipple: Secondary | ICD-10-CM | POA: Diagnosis not present

## 2017-11-24 DIAGNOSIS — C50012 Malignant neoplasm of nipple and areola, left female breast: Secondary | ICD-10-CM

## 2017-11-24 DIAGNOSIS — Z23 Encounter for immunization: Secondary | ICD-10-CM | POA: Diagnosis not present

## 2017-11-24 MED ORDER — ACETAMINOPHEN 325 MG PO TABS
ORAL_TABLET | ORAL | Status: AC
  Filled 2017-11-24: qty 2

## 2017-11-24 MED ORDER — SODIUM CHLORIDE 0.9 % IV SOLN
Freq: Once | INTRAVENOUS | Status: AC
  Administered 2017-11-24: 10:00:00 via INTRAVENOUS
  Filled 2017-11-24: qty 250

## 2017-11-24 MED ORDER — ACETAMINOPHEN 325 MG PO TABS
650.0000 mg | ORAL_TABLET | Freq: Once | ORAL | Status: AC
  Administered 2017-11-24: 650 mg via ORAL

## 2017-11-24 MED ORDER — TRASTUZUMAB CHEMO 150 MG IV SOLR
6.0000 mg/kg | Freq: Once | INTRAVENOUS | Status: AC
  Administered 2017-11-24: 399 mg via INTRAVENOUS
  Filled 2017-11-24: qty 19

## 2017-11-24 MED ORDER — HEPARIN SOD (PORK) LOCK FLUSH 100 UNIT/ML IV SOLN
500.0000 [IU] | Freq: Once | INTRAVENOUS | Status: AC | PRN
  Administered 2017-11-24: 500 [IU]
  Filled 2017-11-24: qty 5

## 2017-11-24 MED ORDER — DIPHENHYDRAMINE HCL 25 MG PO CAPS: 25.0000 mg | ORAL_CAPSULE | Freq: Once | ORAL | Status: DC

## 2017-11-24 MED ORDER — SODIUM CHLORIDE 0.9% FLUSH
10.0000 mL | INTRAVENOUS | Status: DC | PRN
  Administered 2017-11-24: 10 mL
  Filled 2017-11-24: qty 10

## 2017-11-24 NOTE — Patient Instructions (Signed)
[facility] Discharge Instructions for Patients Receiving Chemotherapy  Today you received the following chemotherapy agents Herceptin  To help prevent nausea and vomiting after your treatment, we encourage you to take your nausea medication as directed   If you develop nausea and vomiting that is not controlled by your nausea medication, call the clinic.   BELOW ARE SYMPTOMS THAT SHOULD BE REPORTED IMMEDIATELY:  *FEVER GREATER THAN 100.5 F  *CHILLS WITH OR WITHOUT FEVER  NAUSEA AND VOMITING THAT IS NOT CONTROLLED WITH YOUR NAUSEA MEDICATION  *UNUSUAL SHORTNESS OF BREATH  *UNUSUAL BRUISING OR BLEEDING  TENDERNESS IN MOUTH AND THROAT WITH OR WITHOUT PRESENCE OF ULCERS  *URINARY PROBLEMS  *BOWEL PROBLEMS  UNUSUAL RASH Items with * indicate a potential emergency and should be followed up as soon as possible.  Feel free to call the clinic should you have any questions or concerns. The clinic phone number is [phone].  Please show the CHEMO ALERT CARD at check-in to the Emergency Department and triage nurse.   

## 2017-12-08 ENCOUNTER — Encounter: Payer: Self-pay | Admitting: Hematology and Oncology

## 2017-12-08 ENCOUNTER — Other Ambulatory Visit: Payer: Self-pay

## 2017-12-08 DIAGNOSIS — Z5111 Encounter for antineoplastic chemotherapy: Secondary | ICD-10-CM

## 2017-12-08 DIAGNOSIS — Z5112 Encounter for antineoplastic immunotherapy: Principal | ICD-10-CM

## 2017-12-08 NOTE — Progress Notes (Signed)
Echo ordered.

## 2017-12-13 ENCOUNTER — Ambulatory Visit (HOSPITAL_COMMUNITY)
Admission: RE | Admit: 2017-12-13 | Discharge: 2017-12-13 | Disposition: A | Discharge status: Home / Self Care | Payer: 59 | Source: Ambulatory Visit | Attending: Hematology and Oncology | Admitting: Hematology and Oncology

## 2017-12-13 DIAGNOSIS — I517 Cardiomegaly: Secondary | ICD-10-CM | POA: Insufficient documentation

## 2017-12-13 DIAGNOSIS — Z5111 Encounter for antineoplastic chemotherapy: Secondary | ICD-10-CM | POA: Diagnosis not present

## 2017-12-13 DIAGNOSIS — Z5112 Encounter for antineoplastic immunotherapy: Secondary | ICD-10-CM | POA: Insufficient documentation

## 2017-12-13 NOTE — Progress Notes (Signed)
  Echocardiogram 2D Echocardiogram has been performed.  Amanda Williams 12/13/2017, 11:19 AM

## 2017-12-15 ENCOUNTER — Inpatient Hospital Stay (HOSPITAL_BASED_OUTPATIENT_CLINIC_OR_DEPARTMENT_OTHER): Payer: 59 | Admitting: Hematology and Oncology

## 2017-12-15 ENCOUNTER — Inpatient Hospital Stay: Payer: 59

## 2017-12-15 ENCOUNTER — Telehealth: Payer: Self-pay | Admitting: Hematology and Oncology

## 2017-12-15 VITALS — BP 97/72 | HR 78 | Temp 98.3°F | Resp 18 | Ht 66.0 in | Wt 149.8 lb

## 2017-12-15 DIAGNOSIS — C773 Secondary and unspecified malignant neoplasm of axilla and upper limb lymph nodes: Secondary | ICD-10-CM

## 2017-12-15 DIAGNOSIS — C50011 Malignant neoplasm of nipple and areola, right female breast: Secondary | ICD-10-CM

## 2017-12-15 DIAGNOSIS — C50812 Malignant neoplasm of overlapping 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 breast: Secondary | ICD-10-CM

## 2017-12-15 DIAGNOSIS — Z79811 Long term (current) use of aromatase inhibitors: Secondary | ICD-10-CM

## 2017-12-15 DIAGNOSIS — Z170 Estrogen receptor positive status [ER+]: Secondary | ICD-10-CM

## 2017-12-15 DIAGNOSIS — Z780 Asymptomatic menopausal state: Secondary | ICD-10-CM

## 2017-12-15 DIAGNOSIS — C50012 Malignant neoplasm of nipple and areola, left female breast: Principal | ICD-10-CM

## 2017-12-15 DIAGNOSIS — Z5112 Encounter for antineoplastic immunotherapy: Secondary | ICD-10-CM | POA: Diagnosis not present

## 2017-12-15 DIAGNOSIS — C50811 Malignant neoplasm of overlapping sites of right female breast: Secondary | ICD-10-CM

## 2017-12-15 DIAGNOSIS — Z923 Personal history of irradiation: Secondary | ICD-10-CM

## 2017-12-15 DIAGNOSIS — Z23 Encounter for immunization: Secondary | ICD-10-CM

## 2017-12-15 DIAGNOSIS — Z9012 Acquired absence of left breast and nipple: Secondary | ICD-10-CM

## 2017-12-15 LAB — CBC WITH DIFFERENTIAL (CANCER CENTER ONLY)
BASOS PCT: 0 %
Basophils Absolute: 0 10*3/uL (ref 0.0–0.1)
Eosinophils Absolute: 0.1 10*3/uL (ref 0.0–0.5)
Eosinophils Relative: 3 %
HEMATOCRIT: 35.6 % (ref 34.8–46.6)
HEMOGLOBIN: 11.7 g/dL (ref 11.6–15.9)
LYMPHS ABS: 0.9 10*3/uL (ref 0.9–3.3)
LYMPHS PCT: 20 %
MCH: 29.6 pg (ref 25.1–34.0)
MCHC: 32.9 g/dL (ref 31.5–36.0)
MCV: 90.1 fL (ref 79.5–101.0)
MONO ABS: 0.6 10*3/uL (ref 0.1–0.9)
MONOS PCT: 11 %
NEUTROS ABS: 3.2 10*3/uL (ref 1.5–6.5)
NEUTROS PCT: 66 %
Platelet Count: 239 10*3/uL (ref 145–400)
RBC: 3.95 MIL/uL (ref 3.70–5.45)
RDW: 13.2 % (ref 11.2–14.5)
WBC Count: 4.8 10*3/uL (ref 3.9–10.3)

## 2017-12-15 LAB — CMP (CANCER CENTER ONLY)
ALBUMIN: 3.9 g/dL (ref 3.5–5.0)
ALK PHOS: 116 U/L (ref 38–126)
ALT: 37 U/L (ref 0–44)
ANION GAP: 10 (ref 5–15)
AST: 36 U/L (ref 15–41)
BUN: 16 mg/dL (ref 6–20)
CALCIUM: 9.5 mg/dL (ref 8.9–10.3)
CHLORIDE: 104 mmol/L (ref 98–111)
CO2: 27 mmol/L (ref 22–32)
Creatinine: 0.81 mg/dL (ref 0.44–1.00)
GFR, Estimated: >60 mL/min (ref >60)
GLUCOSE: 99 mg/dL (ref 70–99)
Potassium: 4.1 mmol/L (ref 3.5–5.1)
SODIUM: 141 mmol/L (ref 135–145)
Total Bilirubin: 0.5 mg/dL (ref 0.3–1.2)
Total Protein: 7.4 g/dL (ref 6.5–8.1)

## 2017-12-15 MED ORDER — HEPARIN SOD (PORK) LOCK FLUSH 100 UNIT/ML IV SOLN
500.0000 [IU] | Freq: Once | INTRAVENOUS | Status: AC | PRN
  Administered 2017-12-15: 500 [IU]
  Filled 2017-12-15: qty 5

## 2017-12-15 MED ORDER — DIPHENHYDRAMINE HCL 25 MG PO CAPS
ORAL_CAPSULE | ORAL | Status: AC
  Filled 2017-12-15: qty 1

## 2017-12-15 MED ORDER — INFLUENZA VAC SPLIT QUAD 0.5 ML IM SUSY
0.5000 mL | PREFILLED_SYRINGE | Freq: Once | INTRAMUSCULAR | Status: AC
  Administered 2017-12-15: 0.5 mL via INTRAMUSCULAR

## 2017-12-15 MED ORDER — SODIUM CHLORIDE 0.9 % IV SOLN
Freq: Once | INTRAVENOUS | Status: AC
  Administered 2017-12-15: 10:00:00 via INTRAVENOUS
  Filled 2017-12-15: qty 250

## 2017-12-15 MED ORDER — ACETAMINOPHEN 325 MG PO TABS
ORAL_TABLET | ORAL | Status: AC
  Filled 2017-12-15: qty 1

## 2017-12-15 MED ORDER — ACETAMINOPHEN 325 MG PO TABS
ORAL_TABLET | ORAL | Status: AC
  Filled 2017-12-15: qty 2

## 2017-12-15 MED ORDER — TRASTUZUMAB CHEMO 150 MG IV SOLR
6.0000 mg/kg | Freq: Once | INTRAVENOUS | Status: AC
  Administered 2017-12-15: 399 mg via INTRAVENOUS
  Filled 2017-12-15: qty 19

## 2017-12-15 MED ORDER — DIPHENHYDRAMINE HCL 25 MG PO CAPS: 25.0000 mg | ORAL_CAPSULE | Freq: Once | ORAL | Status: DC

## 2017-12-15 MED ORDER — SODIUM CHLORIDE 0.9% FLUSH
10.0000 mL | Freq: Once | INTRAVENOUS | Status: AC
  Administered 2017-12-15: 10 mL
  Filled 2017-12-15: qty 10

## 2017-12-15 MED ORDER — SODIUM CHLORIDE 0.9% FLUSH
10.0000 mL | INTRAVENOUS | Status: DC | PRN
  Administered 2017-12-15: 10 mL
  Filled 2017-12-15: qty 10

## 2017-12-15 MED ORDER — INFLUENZA VAC SPLIT QUAD 0.5 ML IM SUSY
PREFILLED_SYRINGE | INTRAMUSCULAR | Status: AC
  Filled 2017-12-15: qty 0.5

## 2017-12-15 MED ORDER — ACETAMINOPHEN 325 MG PO TABS
650.0000 mg | ORAL_TABLET | Freq: Once | ORAL | Status: AC
  Administered 2017-12-15: 650 mg via ORAL

## 2017-12-15 NOTE — Progress Notes (Signed)
Patient Care Team: Panosh, Standley Brooking, MD as PCP - General Delsa Bern, MD (Obstetrics and Gynecology) Excell Seltzer, MD as Consulting Physician (General Surgery) Nicholas Lose, MD as Consulting Physician (Hematology and Oncology) Kyung Rudd, MD as Consulting Physician (Radiation Oncology)  DIAGNOSIS:  Encounter Diagnosis  Name Primary?  . Malignant neoplasm of overlapping sites of right breast in female, estrogen receptor positive ([place])     SUMMARY OF ONCOLOGIC HISTORY:   Malignant neoplasm of overlapping 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 breast ([place])   01/12/2017 Surgery    Left mastectomy: IDC with DCIS, 2 tumors 3.6 cm ER 90%, PR 95%, Ki-67 5%, HER-2 negative ratio 1.32) and 1.2 cm (ER 95% PR 95% positive Her 2 Positive ratio 3.2, Ki-67 5%) T2 N0 stage Ib    02/16/2017 -  Chemotherapy    TCH x6 followed by Herceptin maintenance      Malignant neoplasm of overlapping sites of right breast in female, estrogen receptor positive ([place])   12/21/2016 Initial Diagnosis    Palpable lumps in the right breast with nipple inversion: Mammogram revealed skin thickening and distortion, ultrasound revealed 4 masses 3.1 cm at 11:00, 2.5 cm at 6:00, 4 cm at 8:00, 2.2 cm at 5:00 with 2 abnormal axillary lymph nodes: MRI revealed 7 cm abnormality on right breast, in addition 3.6 cm abnormality in the left breast and 2 additional masses 1 cm each; right breast: T4 N1 stage III B clinical stage; left breast: T2 N0 stage IB clinical stage    12/21/2016 Pathology Results    Right breast: Grade 1 Invasive lobular cancer, lymph node also positive, ER 70%, PR 90%, Ki-67 10%, HER-2 negative ratio 1.09 Left breast: Grade 1 IDC with DCIS prognostic panel pending    01/04/2017 Genetic Testing    The patient had genetic testing due to a personal history of bilateral breast caner and a family history of breast cancer.  The Multi-Cancer Panel was ordered. The Multi-Cancer Panel offered by [facility] includes sequencing  and/or deletion duplication testing of the following 83 genes: ALK, APC, ATM, AXIN2,BAP1,  BARD1, BLM, BMPR1A, BRCA1, BRCA2, BRIP1, CASR, CDC73, CDH1, CDK4, CDKN1B, CDKN1C, CDKN2A (p14ARF), CDKN2A (p16INK4a), CEBPA, CHEK2, CTNNA1, DICER1, DIS3L2, EGFR (c.2369C>T, p.Thr790Met variant only), EPCAM (Deletion/duplication testing only), FH, FLCN, GATA2, GPC3, GREM1 (Promoter region deletion/duplication testing only), HOXB13 (c.251G>A, p.Gly84Glu), HRAS, KIT, MAX, MEN1, MET, MITF (c.952G>A, p.Glu318Lys variant only), MLH1, MSH2, MSH3, MSH6, MUTYH, NBN, NF1, NF2, NTHL1, PALB2, PDGFRA, PHOX2B, PMS2, POLD1, POLE, POT1, PRKAR1A, PTCH1, PTEN, RAD50, RAD51C, RAD51D, RB1, RECQL4, RET, RUNX1, SDHAF2, SDHA (sequence changes only), SDHB, SDHC, SDHD, SMAD4, SMARCA4, SMARCB1, SMARCE1, STK11, SUFU, TERC, TERT, TMEM127, TP53, TSC1, TSC2, VHL, WRN and WT1.   Results: Negative, no pathogenic variants identified.  The date of this test report is 01/04/2017.      01/12/2017 Surgery    Right mastectomy: Invasive and in situ lobular carcinoma involves the dermis of the skin of the nipple, margins negative, 3/3 lymph nodes positive, grade 1, EF 75%, PR 95%, HER-2 negative ratio 1.19, Ki-67 10%, T3N1A stage IIa    02/16/2017 - 06/09/2017 Chemotherapy    TCH x6 cycles followed by Herceptin maintenance    08/10/2017 - 09/22/2017 Radiation Therapy    Adjuvant radiation    09/24/2017 -  Anti-estrogen oral therapy    Letrozole 2.5 mg daily     CHIEF COMPLIANT: Follow-up on letrozole and Herceptin  INTERVAL HISTORY: Amanda Williams is a 52 year old with above-mentioned history of HER-2 and ER positive breast cancer  is currently on Herceptin maintenance.  She will finish Herceptin maintenance in November.  She denies any side effects from Herceptin.  But she is experiencing muscle aches and pains from letrozole.  She is very excited that she will be done with Herceptin very soon.  REVIEW OF SYSTEMS:   Constitutional: Denies fevers,  chills or abnormal weight loss Eyes: Denies blurriness of vision Ears, nose, mouth, throat, and face: Denies mucositis or sore throat Respiratory: Denies cough, dyspnea or wheezes Cardiovascular: Denies palpitation, chest discomfort Gastrointestinal:  Denies nausea, heartburn or change in bowel habits Skin: Denies abnormal skin rashes Lymphatics: Denies new lymphadenopathy or easy bruising Neurological:Denies numbness, tingling or new weaknesses Behavioral/Psych: Mood is stable, no new changes  Extremities: No lower extremity edema   All other systems were reviewed with the patient and are negative.  I have reviewed the past medical history, past surgical history, social history and family history with the patient and they are unchanged from previous note.  ALLERGIES:  is allergic to penicillins; sulfonamide derivatives; and adhesive [tape].  MEDICATIONS:  Current Outpatient Medications  Medication Sig Dispense Refill  . acetaminophen (TYLENOL) 500 MG tablet Take 1,000 mg by mouth 2 (two) times daily as needed for moderate pain (for pain/headaches.).     Marland Kitchen ibuprofen (ADVIL,MOTRIN) 200 MG tablet Take 400 mg by mouth 2 (two) times daily as needed for moderate pain (for pain/headaches).     Marland Kitchen letrozole (FEMARA) 2.5 MG tablet Take 1 tablet (2.5 mg total) by mouth daily. 90 tablet 3  . lidocaine-prilocaine (EMLA) cream Apply to affected area once 30 g 0  . oxyCODONE (ROXICODONE) 5 MG immediate release tablet Take 1 tablet (5 mg total) by mouth every 6 (six) hours as needed. 20 tablet 0  . sertraline (ZOLOFT) 100 MG tablet Take 100 mg by mouth at bedtime.      No current facility-administered medications for this visit.     PHYSICAL EXAMINATION: ECOG PERFORMANCE STATUS: 1 - Symptomatic but completely ambulatory  Vitals:   12/15/17 0851  BP: 97/72  Pulse: 78  Resp: 18  Temp: 98.3 F (36.8 C)  SpO2: 100%   Filed Weights   12/15/17 0851  Weight: 149 lb 12.8 oz (67.9 kg)     GENERAL:alert, no distress and comfortable SKIN: skin color, texture, turgor are normal, no rashes or significant lesions EYES: normal, Conjunctiva are pink and non-injected, sclera clear OROPHARYNX:no exudate, no erythema and lips, buccal mucosa, and tongue normal  NECK: supple, thyroid normal size, non-tender, without nodularity LYMPH:  no palpable lymphadenopathy in the cervical, axillary or inguinal LUNGS: clear to auscultation and percussion with normal breathing effort HEART: regular rate & rhythm and no murmurs and no lower extremity edema ABDOMEN:abdomen soft, non-tender and normal bowel sounds MUSCULOSKELETAL:no cyanosis of digits and no clubbing  NEURO: alert & oriented x 3 with fluent speech, no focal motor/sensory deficits EXTREMITIES: No lower extremity edema  LABORATORY DATA:  I have reviewed the data as listed CMP Latest Ref Rng & Units 11/03/2017 09/22/2017 08/11/2017  Glucose 70 - 99 mg/dL 209(H) 147(H) 130  BUN 6 - 20 mg/dL 12 13 12   Creatinine 0.44 - 1.00 mg/dL 0.86 0.95 0.89  Sodium 135 - 145 mmol/L 140 137 141  Potassium 3.5 - 5.1 mmol/L 3.6 3.8 3.7  Chloride 98 - 111 mmol/L 105 101 106  CO2 22 - 32 mmol/L 22 27 25   Calcium 8.9 - 10.3 mg/dL 8.9 9.2 9.2  Total Protein 6.5 - 8.1 g/dL 6.9 7.0  7.2  Total Bilirubin 0.3 - 1.2 mg/dL 0.3 0.3 0.4  Alkaline Phos 38 - 126 U/L 108 99 87  AST 15 - 41 U/L 13(L) 19 24  ALT 0 - 44 U/L 15 16 21     Lab Results  Component Value Date   WBC 4.8 12/15/2017   HGB 11.7 12/15/2017   HCT 35.6 12/15/2017   MCV 90.1 12/15/2017   PLT 239 12/15/2017   NEUTROABS 3.2 12/15/2017    ASSESSMENT & PLAN:  Malignant neoplasm of overlapping sites of right breast in female, estrogen receptor positive ([place]) 01/12/2017:Left mastectomy: IDC with DCIS, 2 tumors 3.6 cm ER 90%, PR 95%, Ki-67 5%, HER-2 negative ratio 1.32) and 1.2 cm (ER 95% PR 95% positive Her 2 Positive ratio 3.2, Ki-67 5%) T2 N0 stage Ib Right mastectomy: Invasive and in situ  lobular carcinoma involves the dermis of the skin of the nipple, margins negative, 3/3 lymph nodes positive, grade 1, ER75%, PR 95%, HER-2 negative ratio 1.19, Ki-67 10%, T3N1A stage IIa  Treatment plan: 1. Adjuvant chemotherapy with TCH x 4 cycles completed on 06/09/2017 (Taxotere held from final 2 cycles)followed by Herceptin maintenance for 1 year 2. Adjuvant radiation therapystarted 08/10/2017-09/22/2017 3. Followed by adjuvant antiestrogen therapy __________________________________________________________________________________________ Echo on3/27/2019: 55% - 60% Current treatment:Herceptin maintenance to be completed November 2019 Herceptin toxicities: None  Current treatment: Letrozole 2.5 mg daily started 09/24/2017  Letrozole toxicities: Denies any hot flashes but complaining of muscle stiffness and achiness We discussed use of tonic water at bedtime.  Breast cancer surveillance: Mammograms will need to be performed on the right breast September 2019  Return to clinic in every 3 weeks for Herceptin every 6 weeks for follow-up with me.    No orders of the defined types were placed in this encounter.  The patient has a good understanding of the overall plan. she agrees with it. she will call with any problems that may develop before the next visit here.   Harriette Ohara, MD 12/15/17

## 2017-12-15 NOTE — Assessment & Plan Note (Signed)
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 Adjuvant chemotherapy with TCH x 4 cycles completed on 06/09/2017 (Taxotere held from final 2 cycles)followed by Herceptin maintenance for 1 year 2. Adjuvant radiation therapystarted 08/10/2017-09/22/2017 3. Followed by adjuvant antiestrogen therapy __________________________________________________________________________________________ Echo on3/27/2019: 55% - 60% Current treatment:Herceptin maintenance to be completed November 2019 Herceptin toxicities: None  Current treatment: Letrozole 2.5 mg daily started 09/24/2017  Letrozole toxicities: Denies any hot flashes  Breast cancer surveillance: Mammograms will need to be performed on the right breast September 2019  Return to clinic in every 3 weeks for Herceptin every 6 weeks for follow-up with me.

## 2017-12-15 NOTE — Telephone Encounter (Signed)
Per 9/25 los, faxed order for Bone Density to [facility].

## 2017-12-15 NOTE — Patient Instructions (Signed)
Trastuzumab injection for infusion What is this medicine? TRASTUZUMAB (tras TOO zoo mab) is a monoclonal antibody. It is used to treat breast cancer and stomach cancer. This medicine may be used for other purposes; ask your health care provider or pharmacist if you have questions. COMMON BRAND NAME(S): Herceptin What should I tell my health care provider before I take this medicine? They need to know if you have any of these conditions: -heart disease -heart failure -lung or breathing disease, like asthma -an unusual or allergic reaction to trastuzumab, benzyl alcohol, or other medications, foods, dyes, or preservatives -pregnant or trying to get pregnant -breast-feeding How should I use this medicine? This drug is given as an infusion into a vein. It is administered in a hospital or clinic by a specially trained health care professional. Talk to your pediatrician regarding the use of this medicine in children. This medicine is not approved for use in children. Overdosage: If you think you have taken too much of this medicine contact a poison control center or emergency room at once. NOTE: This medicine is only for you. Do not share this medicine with others. What if I miss a dose? It is important not to miss a dose. Call your doctor or health care professional if you are unable to keep an appointment. What may interact with this medicine? This medicine may interact with the following medications: -certain types of chemotherapy, such as daunorubicin, doxorubicin, epirubicin, and idarubicin This list may not describe all possible interactions. Give your health care provider a list of all the medicines, herbs, non-prescription drugs, or dietary supplements you use. Also tell them if you smoke, drink alcohol, or use illegal drugs. Some items may interact with your medicine. What should I watch for while using this medicine? Visit your doctor for checks on your progress. Report any side effects.  Continue your course of treatment even though you feel ill unless your doctor tells you to stop. Call your doctor or health care professional for advice if you get a fever, chills or sore throat, or other symptoms of a cold or flu. Do not treat yourself. Try to avoid being around people who are sick. You may experience fever, chills and shaking during your first infusion. These effects are usually mild and can be treated with other medicines. Report any side effects during the infusion to your health care professional. Fever and chills usually do not happen with later infusions. Do not become pregnant while taking this medicine or for 7 months after stopping it. Women should inform their doctor if they wish to become pregnant or think they might be pregnant. Women of child-bearing potential will need to have a negative pregnancy test before starting this medicine. There is a potential for serious side effects to an unborn child. Talk to your health care professional or pharmacist for more information. Do not breast-feed an infant while taking this medicine or for 7 months after stopping it. Women must use effective birth control with this medicine. What side effects may I notice from receiving this medicine? Side effects that you should report to your doctor or health care professional as soon as possible: -allergic reactions like skin rash, itching or hives, swelling of the face, lips, or tongue -chest pain or palpitations -cough -dizziness -feeling faint or lightheaded, falls -fever -general ill feeling or flu-like symptoms -signs of worsening heart failure like breathing problems; swelling in your legs and feet -unusually weak or tired Side effects that usually do not require medical   attention (report to your doctor or health care professional if they continue or are bothersome): -bone pain -changes in taste -diarrhea -joint pain -nausea/vomiting -weight loss This list may not describe all  possible side effects. Call your doctor for medical advice about side effects. You may report side effects to FDA at 1-800-FDA-1088. Where should I keep my medicine? This drug is given in a hospital or clinic and will not be stored at home. NOTE: This sheet is a summary. It may not cover all possible information. If you have questions about this medicine, talk to your doctor, pharmacist, or health care provider.  2018 Elsevier/Gold Standard (2016-03-03 14:37:52)  

## 2018-01-05 ENCOUNTER — Inpatient Hospital Stay: Payer: 59 | Attending: Hematology and Oncology

## 2018-01-05 VITALS — BP 111/71 | HR 72 | Temp 98.1°F | Resp 17 | Ht 66.0 in | Wt 151.2 lb

## 2018-01-05 DIAGNOSIS — C50812 Malignant neoplasm of overlapping 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 breast: Secondary | ICD-10-CM | POA: Insufficient documentation

## 2018-01-05 DIAGNOSIS — C773 Secondary and unspecified malignant neoplasm of axilla and upper limb lymph nodes: Secondary | ICD-10-CM | POA: Insufficient documentation

## 2018-01-05 DIAGNOSIS — Z170 Estrogen receptor positive status [ER+]: Secondary | ICD-10-CM | POA: Diagnosis not present

## 2018-01-05 DIAGNOSIS — Z5112 Encounter for antineoplastic immunotherapy: Secondary | ICD-10-CM | POA: Diagnosis not present

## 2018-01-05 DIAGNOSIS — C50811 Malignant neoplasm of overlapping sites of right female breast: Secondary | ICD-10-CM | POA: Diagnosis not present

## 2018-01-05 DIAGNOSIS — C50012 Malignant neoplasm of nipple and areola, left female breast: Secondary | ICD-10-CM

## 2018-01-05 DIAGNOSIS — Z79899 Other long term (current) drug therapy: Secondary | ICD-10-CM | POA: Diagnosis not present

## 2018-01-05 DIAGNOSIS — C50011 Malignant neoplasm of nipple and areola, right female breast: Secondary | ICD-10-CM

## 2018-01-05 MED ORDER — SODIUM CHLORIDE 0.9 % IV SOLN
Freq: Once | INTRAVENOUS | Status: AC
  Administered 2018-01-05: 09:00:00 via INTRAVENOUS
  Filled 2018-01-05: qty 250

## 2018-01-05 MED ORDER — ACETAMINOPHEN 325 MG PO TABS
650.0000 mg | ORAL_TABLET | Freq: Once | ORAL | Status: AC
  Administered 2018-01-05: 650 mg via ORAL

## 2018-01-05 MED ORDER — DIPHENHYDRAMINE HCL 25 MG PO CAPS: 25.0000 mg | ORAL_CAPSULE | Freq: Once | ORAL | Status: DC

## 2018-01-05 MED ORDER — ACETAMINOPHEN 325 MG PO TABS
ORAL_TABLET | ORAL | Status: AC
  Filled 2018-01-05: qty 2

## 2018-01-05 MED ORDER — TRASTUZUMAB CHEMO 150 MG IV SOLR
6.0000 mg/kg | Freq: Once | INTRAVENOUS | Status: AC
  Administered 2018-01-05: 399 mg via INTRAVENOUS
  Filled 2018-01-05: qty 19

## 2018-01-05 MED ORDER — DIPHENHYDRAMINE HCL 25 MG PO CAPS
ORAL_CAPSULE | ORAL | Status: AC
  Filled 2018-01-05: qty 1

## 2018-01-05 MED ORDER — SODIUM CHLORIDE 0.9% FLUSH
10.0000 mL | INTRAVENOUS | Status: DC | PRN
  Administered 2018-01-05: 10 mL
  Filled 2018-01-05: qty 10

## 2018-01-05 MED ORDER — HEPARIN SOD (PORK) LOCK FLUSH 100 UNIT/ML IV SOLN
500.0000 [IU] | Freq: Once | INTRAVENOUS | Status: AC | PRN
  Administered 2018-01-05: 500 [IU]
  Filled 2018-01-05: qty 5

## 2018-01-05 NOTE — Patient Instructions (Signed)
[facility] Discharge Instructions for Patients Receiving Chemotherapy  Today you received the following chemotherapy agents: Trastuzumab (Herceptin)  To help prevent nausea and vomiting after your treatment, we encourage you to take your nausea medication as directed.    If you develop nausea and vomiting that is not controlled by your nausea medication, call the clinic.   BELOW ARE SYMPTOMS THAT SHOULD BE REPORTED IMMEDIATELY:  *FEVER GREATER THAN 100.5 F  *CHILLS WITH OR WITHOUT FEVER  NAUSEA AND VOMITING THAT IS NOT CONTROLLED WITH YOUR NAUSEA MEDICATION  *UNUSUAL SHORTNESS OF BREATH  *UNUSUAL BRUISING OR BLEEDING  TENDERNESS IN MOUTH AND THROAT WITH OR WITHOUT PRESENCE OF ULCERS  *URINARY PROBLEMS  *BOWEL PROBLEMS  UNUSUAL RASH Items with * indicate a potential emergency and should be followed up as soon as possible.  Feel free to call the clinic should you have any questions or concerns. The clinic phone number is [phone].  Please show the CHEMO ALERT CARD at check-in to the Emergency Department and triage nurse.    

## 2018-01-26 ENCOUNTER — Inpatient Hospital Stay: Payer: 59 | Attending: Hematology and Oncology

## 2018-01-26 ENCOUNTER — Inpatient Hospital Stay: Payer: 59

## 2018-01-26 ENCOUNTER — Encounter: Payer: Self-pay | Admitting: *Deleted

## 2018-01-26 ENCOUNTER — Telehealth: Payer: Self-pay | Admitting: Hematology and Oncology

## 2018-01-26 ENCOUNTER — Inpatient Hospital Stay (HOSPITAL_BASED_OUTPATIENT_CLINIC_OR_DEPARTMENT_OTHER): Payer: 59 | Admitting: Hematology and Oncology

## 2018-01-26 DIAGNOSIS — C50011 Malignant neoplasm of nipple and areola, right female breast: Secondary | ICD-10-CM

## 2018-01-26 DIAGNOSIS — C50812 Malignant neoplasm of overlapping 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 breast: Secondary | ICD-10-CM

## 2018-01-26 DIAGNOSIS — Z79811 Long term (current) use of aromatase inhibitors: Secondary | ICD-10-CM

## 2018-01-26 DIAGNOSIS — Z170 Estrogen receptor positive status [ER+]: Secondary | ICD-10-CM

## 2018-01-26 DIAGNOSIS — Z923 Personal history of irradiation: Secondary | ICD-10-CM | POA: Insufficient documentation

## 2018-01-26 DIAGNOSIS — C50012 Malignant neoplasm of nipple and areola, left female breast: Principal | ICD-10-CM

## 2018-01-26 DIAGNOSIS — Z5112 Encounter for antineoplastic immunotherapy: Secondary | ICD-10-CM | POA: Diagnosis not present

## 2018-01-26 DIAGNOSIS — C773 Secondary and unspecified malignant neoplasm of axilla and upper limb lymph nodes: Secondary | ICD-10-CM

## 2018-01-26 DIAGNOSIS — Z79899 Other long term (current) drug therapy: Secondary | ICD-10-CM | POA: Diagnosis not present

## 2018-01-26 DIAGNOSIS — C50811 Malignant neoplasm of overlapping sites of right female breast: Secondary | ICD-10-CM

## 2018-01-26 DIAGNOSIS — Z9013 Acquired absence of bilateral breasts and nipples: Secondary | ICD-10-CM | POA: Insufficient documentation

## 2018-01-26 LAB — CMP (CANCER CENTER ONLY)
ALBUMIN: 3.7 g/dL (ref 3.5–5.0)
ALK PHOS: 103 U/L (ref 38–126)
ALT: 13 U/L (ref 0–44)
ANION GAP: 9 (ref 5–15)
AST: 14 U/L — ABNORMAL LOW (ref 15–41)
BILIRUBIN TOTAL: 0.4 mg/dL (ref 0.3–1.2)
BUN: 15 mg/dL (ref 6–20)
CALCIUM: 9.3 mg/dL (ref 8.9–10.3)
CO2: 26 mmol/L (ref 22–32)
Chloride: 105 mmol/L (ref 98–111)
Creatinine: 0.89 mg/dL (ref 0.44–1.00)
GFR, Est AFR Am: >60 mL/min (ref >60)
GFR, Estimated: >60 mL/min (ref >60)
GLUCOSE: 108 mg/dL — AB (ref 70–99)
Potassium: 4.1 mmol/L (ref 3.5–5.1)
Sodium: 140 mmol/L (ref 135–145)
TOTAL PROTEIN: 7.1 g/dL (ref 6.5–8.1)

## 2018-01-26 LAB — CBC WITH DIFFERENTIAL (CANCER CENTER ONLY)
Abs Immature Granulocytes: 0.01 10*3/uL (ref 0.00–0.07)
BASOS ABS: 0 10*3/uL (ref 0.0–0.1)
Basophils Relative: 1 %
EOS ABS: 0.1 10*3/uL (ref 0.0–0.5)
EOS PCT: 2 %
HEMATOCRIT: 35.3 % — AB (ref 36.0–46.0)
Hemoglobin: 11.6 g/dL — ABNORMAL LOW (ref 12.0–15.0)
Immature Granulocytes: 0 %
Lymphocytes Relative: 25 %
Lymphs Abs: 1.2 10*3/uL (ref 0.7–4.0)
MCH: 29.9 pg (ref 26.0–34.0)
MCHC: 32.9 g/dL (ref 30.0–36.0)
MCV: 91 fL (ref 80.0–100.0)
MONOS PCT: 9 %
Monocytes Absolute: 0.4 10*3/uL (ref 0.1–1.0)
NRBC: 0 % (ref 0.0–0.2)
Neutro Abs: 3 10*3/uL (ref 1.7–7.7)
Neutrophils Relative %: 63 %
Platelet Count: 241 10*3/uL (ref 150–400)
RBC: 3.88 MIL/uL (ref 3.87–5.11)
RDW: 12.4 % (ref 11.5–15.5)
WBC Count: 4.8 10*3/uL (ref 4.0–10.5)

## 2018-01-26 MED ORDER — HEPARIN SOD (PORK) LOCK FLUSH 100 UNIT/ML IV SOLN
500.0000 [IU] | Freq: Once | INTRAVENOUS | Status: DC | PRN
  Filled 2018-01-26: qty 5

## 2018-01-26 MED ORDER — ACETAMINOPHEN 325 MG PO TABS
ORAL_TABLET | ORAL | Status: AC
  Filled 2018-01-26: qty 2

## 2018-01-26 MED ORDER — SODIUM CHLORIDE 0.9% FLUSH
10.0000 mL | Freq: Once | INTRAVENOUS | Status: AC
  Administered 2018-01-26: 10 mL
  Filled 2018-01-26: qty 10

## 2018-01-26 MED ORDER — SODIUM CHLORIDE 0.9 % IV SOLN
Freq: Once | INTRAVENOUS | Status: AC
  Administered 2018-01-26: 09:00:00 via INTRAVENOUS
  Filled 2018-01-26: qty 250

## 2018-01-26 MED ORDER — DIPHENHYDRAMINE HCL 25 MG PO CAPS: 25.0000 mg | ORAL_CAPSULE | Freq: Once | ORAL | Status: DC

## 2018-01-26 MED ORDER — SODIUM CHLORIDE 0.9% FLUSH
10.0000 mL | INTRAVENOUS | Status: DC | PRN
  Filled 2018-01-26: qty 10

## 2018-01-26 MED ORDER — ACETAMINOPHEN 325 MG PO TABS
650.0000 mg | ORAL_TABLET | Freq: Once | ORAL | Status: AC
  Administered 2018-01-26: 650 mg via ORAL

## 2018-01-26 MED ORDER — TRASTUZUMAB CHEMO 150 MG IV SOLR
6.0000 mg/kg | Freq: Once | INTRAVENOUS | Status: AC
  Administered 2018-01-26: 399 mg via INTRAVENOUS
  Filled 2018-01-26: qty 19

## 2018-01-26 MED ORDER — DIPHENHYDRAMINE HCL 25 MG PO CAPS
ORAL_CAPSULE | ORAL | Status: AC
  Filled 2018-01-26: qty 1

## 2018-01-26 NOTE — Progress Notes (Signed)
Patient Care Team: Panosh, Standley Brooking, MD as PCP - General Delsa Bern, MD (Obstetrics and Gynecology) Excell Seltzer, MD as Consulting Physician (General Surgery) Nicholas Lose, MD as Consulting Physician (Hematology and Oncology) Kyung Rudd, MD as Consulting Physician (Radiation Oncology)  DIAGNOSIS:  Encounter Diagnosis  Name Primary?  . Malignant neoplasm of overlapping sites of right breast in female, estrogen receptor positive ([place])     SUMMARY OF ONCOLOGIC HISTORY:   Malignant neoplasm of overlapping 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 breast ([place])   01/12/2017 Surgery    Left mastectomy: IDC with DCIS, 2 tumors 3.6 cm ER 90%, PR 95%, Ki-67 5%, HER-2 negative ratio 1.32) and 1.2 cm (ER 95% PR 95% positive Her 2 Positive ratio 3.2, Ki-67 5%) T2 N0 stage Ib    02/16/2017 -  Chemotherapy    TCH x6 followed by Herceptin maintenance      Malignant neoplasm of overlapping sites of right breast in female, estrogen receptor positive ([place])   12/21/2016 Initial Diagnosis    Palpable lumps in the right breast with nipple inversion: Mammogram revealed skin thickening and distortion, ultrasound revealed 4 masses 3.1 cm at 11:00, 2.5 cm at 6:00, 4 cm at 8:00, 2.2 cm at 5:00 with 2 abnormal axillary lymph nodes: MRI revealed 7 cm abnormality on right breast, in addition 3.6 cm abnormality in the left breast and 2 additional masses 1 cm each; right breast: T4 N1 stage III B clinical stage; left breast: T2 N0 stage IB clinical stage    12/21/2016 Pathology Results    Right breast: Grade 1 Invasive lobular cancer, lymph node also positive, ER 70%, PR 90%, Ki-67 10%, HER-2 negative ratio 1.09 Left breast: Grade 1 IDC with DCIS prognostic panel pending    01/04/2017 Genetic Testing    The patient had genetic testing due to a personal history of bilateral breast caner and a family history of breast cancer.  The Multi-Cancer Panel was ordered. The Multi-Cancer Panel offered by [facility] includes sequencing  and/or deletion duplication testing of the following 83 genes: ALK, APC, ATM, AXIN2,BAP1,  BARD1, BLM, BMPR1A, BRCA1, BRCA2, BRIP1, CASR, CDC73, CDH1, CDK4, CDKN1B, CDKN1C, CDKN2A (p14ARF), CDKN2A (p16INK4a), CEBPA, CHEK2, CTNNA1, DICER1, DIS3L2, EGFR (c.2369C>T, p.Thr790Met variant only), EPCAM (Deletion/duplication testing only), FH, FLCN, GATA2, GPC3, GREM1 (Promoter region deletion/duplication testing only), HOXB13 (c.251G>A, p.Gly84Glu), HRAS, KIT, MAX, MEN1, MET, MITF (c.952G>A, p.Glu318Lys variant only), MLH1, MSH2, MSH3, MSH6, MUTYH, NBN, NF1, NF2, NTHL1, PALB2, PDGFRA, PHOX2B, PMS2, POLD1, POLE, POT1, PRKAR1A, PTCH1, PTEN, RAD50, RAD51C, RAD51D, RB1, RECQL4, RET, RUNX1, SDHAF2, SDHA (sequence changes only), SDHB, SDHC, SDHD, SMAD4, SMARCA4, SMARCB1, SMARCE1, STK11, SUFU, TERC, TERT, TMEM127, TP53, TSC1, TSC2, VHL, WRN and WT1.   Results: Negative, no pathogenic variants identified.  The date of this test report is 01/04/2017.      01/12/2017 Surgery    Right mastectomy: Invasive and in situ lobular carcinoma involves the dermis of the skin of the nipple, margins negative, 3/3 lymph nodes positive, grade 1, EF 75%, PR 95%, HER-2 negative ratio 1.19, Ki-67 10%, T3N1A stage IIa    02/16/2017 - 06/09/2017 Chemotherapy    TCH x6 cycles followed by Herceptin maintenance    08/10/2017 - 09/22/2017 Radiation Therapy    Adjuvant radiation    09/24/2017 -  Anti-estrogen oral therapy    Letrozole 2.5 mg daily     CHIEF COMPLIANT: Last dose of Herceptin  INTERVAL HISTORY: Amanda Williams is a 52 year old with above-mentioned history of right breast mastectomy followed by adjuvant chemotherapy  and today's last day of Herceptin treatment.  She has tolerated Herceptin extremely well without any problems or concerns.  Last echocardiogram showed a light decrease in the ejection fraction from 55 to 60% down to 50 to 55%.  She is complaining of diffuse arthralgias and myalgias related to hormone therapy.  REVIEW  OF SYSTEMS:   Constitutional: Denies fevers, chills or abnormal weight loss Eyes: Denies blurriness of vision Ears, nose, mouth, throat, and face: Denies mucositis or sore throat Respiratory: Denies cough, dyspnea or wheezes Cardiovascular: Denies palpitation, chest discomfort Gastrointestinal:  Denies nausea, heartburn or change in bowel habits Skin: Denies abnormal skin rashes Lymphatics: Denies new lymphadenopathy or easy bruising Neurological:Denies numbness, tingling or new weaknesses Behavioral/Psych: Mood is stable, no new changes  Extremities: No lower extremity edema   All other systems were reviewed with the patient and are negative.  I have reviewed the past medical history, past surgical history, social history and family history with the patient and they are unchanged from previous note.  ALLERGIES:  is allergic to penicillins; sulfonamide derivatives; and adhesive [tape].  MEDICATIONS:  Current Outpatient Medications  Medication Sig Dispense Refill  . acetaminophen (TYLENOL) 500 MG tablet Take 1,000 mg by mouth 2 (two) times daily as needed for moderate pain (for pain/headaches.).     Marland Kitchen ibuprofen (ADVIL,MOTRIN) 200 MG tablet Take 400 mg by mouth 2 (two) times daily as needed for moderate pain (for pain/headaches).     Marland Kitchen letrozole (FEMARA) 2.5 MG tablet Take 1 tablet (2.5 mg total) by mouth daily. 90 tablet 3  . lidocaine-prilocaine (EMLA) cream Apply to affected area once 30 g 0  . sertraline (ZOLOFT) 100 MG tablet Take 100 mg by mouth at bedtime.      No current facility-administered medications for this visit.     PHYSICAL EXAMINATION: ECOG PERFORMANCE STATUS: 1 - Symptomatic but completely ambulatory  Vitals:   01/26/18 0838  BP: 103/69  Pulse: 72  Resp: 17  Temp: 98.5 F (36.9 C)  SpO2: 99%   Filed Weights   01/26/18 0838  Weight: 152 lb 3.2 oz (69 kg)    GENERAL:alert, no distress and comfortable SKIN: skin color, texture, turgor are normal, no  rashes or significant lesions EYES: normal, Conjunctiva are pink and non-injected, sclera clear OROPHARYNX:no exudate, no erythema and lips, buccal mucosa, and tongue normal  NECK: supple, thyroid normal size, non-tender, without nodularity LYMPH:  no palpable lymphadenopathy in the cervical, axillary or inguinal LUNGS: clear to auscultation and percussion with normal breathing effort HEART: regular rate & rhythm and no murmurs and no lower extremity edema ABDOMEN:abdomen soft, non-tender and normal bowel sounds MUSCULOSKELETAL:no cyanosis of digits and no clubbing  NEURO: alert & oriented x 3 with fluent speech, no focal motor/sensory deficits EXTREMITIES: No lower extremity edema   LABORATORY DATA:  I have reviewed the data as listed CMP Latest Ref Rng & Units 12/15/2017 11/03/2017 09/22/2017  Glucose 70 - 99 mg/dL 99 209(H) 147(H)  BUN 6 - 20 mg/dL 16 12 13   Creatinine 0.44 - 1.00 mg/dL 0.81 0.86 0.95  Sodium 135 - 145 mmol/L 141 140 137  Potassium 3.5 - 5.1 mmol/L 4.1 3.6 3.8  Chloride 98 - 111 mmol/L 104 105 101  CO2 22 - 32 mmol/L 27 22 27   Calcium 8.9 - 10.3 mg/dL 9.5 8.9 9.2  Total Protein 6.5 - 8.1 g/dL 7.4 6.9 7.0  Total Bilirubin 0.3 - 1.2 mg/dL 0.5 0.3 0.3  Alkaline Phos 38 - 126 U/L 116  108 99  AST 15 - 41 U/L 36 13(L) 19  ALT 0 - 44 U/L 37 15 16    Lab Results  Component Value Date   WBC 4.8 01/26/2018   HGB 11.6 (L) 01/26/2018   HCT 35.3 (L) 01/26/2018   MCV 91.0 01/26/2018   PLT 241 01/26/2018   NEUTROABS 3.0 01/26/2018    ASSESSMENT & PLAN:  Malignant neoplasm of overlapping sites of right breast in female, estrogen receptor positive ([place]) 01/12/2017:Left mastectomy: IDC with DCIS, 2 tumors 3.6 cm ER 90%, PR 95%, Ki-67 5%, HER-2 negative ratio 1.32) and 1.2 cm (ER 95% PR 95% positive Her 2 Positive ratio 3.2, Ki-67 5%) T2 N0 stage Ib Right mastectomy: Invasive and in situ lobular carcinoma involves the dermis of the skin of the nipple, margins negative, 3/3  lymph nodes positive, grade 1, ER75%, PR 95%, HER-2 negative ratio 1.19, Ki-67 10%, T3N1A stage IIa  Treatment plan: 1. Adjuvant chemotherapy with TCH x 4 cycles completed on 06/09/2017 (Taxotere held from final 2 cycles)followed by Herceptin maintenance for 1 year 2. Adjuvant radiation therapystarted 08/10/2017-09/22/2017 3. Followed by adjuvant antiestrogen therapy __________________________________________________________________________________________ Echo on3/27/2019: 55% - 60% Current treatment:Herceptin maintenanceto be completed November 2019 Herceptin toxicities: None  Current treatment:Letrozole 2.5 mg daily started 09/24/2017  Letrozole toxicities:Denies any hot flashes but complaining of muscle stiffness and achiness We discussed use of CBD oil.  Breast cancer surveillance: Mammograms will need to be performed on the right breast   Return to clinic in 3 months for survivorship care plan visit   No orders of the defined types were placed in this encounter.  The patient has a good understanding of the overall plan. she agrees with it. she will call with any problems that may develop before the next visit here.   Harriette Ohara, MD 01/26/18

## 2018-01-26 NOTE — Telephone Encounter (Signed)
Printed calendar and avs. °

## 2018-01-26 NOTE — Assessment & Plan Note (Signed)
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 Adjuvant chemotherapy with TCH x 4 cycles completed on 06/09/2017 (Taxotere held from final 2 cycles)followed by Herceptin maintenance for 1 year 2. Adjuvant radiation therapystarted 08/10/2017-09/22/2017 3. Followed by adjuvant antiestrogen therapy __________________________________________________________________________________________ Echo on3/27/2019: 55% - 60% Current treatment:Herceptin maintenanceto be completed November 2019 Herceptin toxicities: None  Current treatment:Letrozole 2.5 mg daily started 09/24/2017  Letrozole toxicities:Denies any hot flashes but complaining of muscle stiffness and achiness We discussed use of tonic water at bedtime.  Breast cancer surveillance: Mammograms will need to be performed on the right breast September 2019  Return to clinic inevery 3 weeks for Herceptin every 6 weeks for follow-up with me.

## 2018-01-26 NOTE — Patient Instructions (Addendum)
[facility] Discharge Instructions for Patients Receiving Chemotherapy  Today you received the following antibody agents Herceptin  To help prevent nausea and vomiting after your treatment, we encourage you to take your nausea medication as directed   If you develop nausea and vomiting that is not controlled by your nausea medication, call the clinic.   BELOW ARE SYMPTOMS THAT SHOULD BE REPORTED IMMEDIATELY:  *FEVER GREATER THAN 100.5 F  *CHILLS WITH OR WITHOUT FEVER  NAUSEA AND VOMITING THAT IS NOT CONTROLLED WITH YOUR NAUSEA MEDICATION  *UNUSUAL SHORTNESS OF BREATH  *UNUSUAL BRUISING OR BLEEDING  TENDERNESS IN MOUTH AND THROAT WITH OR WITHOUT PRESENCE OF ULCERS  *URINARY PROBLEMS  *BOWEL PROBLEMS  UNUSUAL RASH Items with * indicate a potential emergency and should be followed up as soon as possible.  Feel free to call the clinic should you have any questions or concerns. The clinic phone number is [phone].  Please show the [facility] at check-in to the Emergency Department and triage nurse.

## 2018-01-28 ENCOUNTER — Telehealth: Payer: Self-pay

## 2018-01-28 NOTE — Telephone Encounter (Signed)
Patient left voicemail asking if lab appointment could be scheduled closer to scan time.  Nurse rescheduled lab appointment.  Left message with information.

## 2018-02-02 ENCOUNTER — Inpatient Hospital Stay: Payer: 59

## 2018-02-02 ENCOUNTER — Ambulatory Visit (HOSPITAL_COMMUNITY)
Admission: RE | Admit: 2018-02-02 | Discharge: 2018-02-02 | Disposition: A | Discharge status: Home / Self Care | Payer: 59 | Source: Ambulatory Visit | Attending: Hematology and Oncology | Admitting: Hematology and Oncology

## 2018-02-02 ENCOUNTER — Other Ambulatory Visit: Payer: 59

## 2018-02-02 ENCOUNTER — Encounter (HOSPITAL_COMMUNITY): Payer: Self-pay

## 2018-02-02 DIAGNOSIS — Z170 Estrogen receptor positive status [ER+]: Secondary | ICD-10-CM

## 2018-02-02 DIAGNOSIS — C50812 Malignant neoplasm of overlapping 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 breast: Secondary | ICD-10-CM

## 2018-02-02 DIAGNOSIS — C50811 Malignant neoplasm of overlapping sites of right female breast: Secondary | ICD-10-CM | POA: Diagnosis not present

## 2018-02-02 DIAGNOSIS — Z5112 Encounter for antineoplastic immunotherapy: Secondary | ICD-10-CM | POA: Diagnosis not present

## 2018-02-02 LAB — CMP (CANCER CENTER ONLY)
ALK PHOS: 113 U/L (ref 38–126)
ALT: 15 U/L (ref 0–44)
ANION GAP: 10 (ref 5–15)
AST: 17 U/L (ref 15–41)
Albumin: 3.9 g/dL (ref 3.5–5.0)
BILIRUBIN TOTAL: 0.3 mg/dL (ref 0.3–1.2)
BUN: 15 mg/dL (ref 6–20)
CALCIUM: 9.6 mg/dL (ref 8.9–10.3)
CO2: 29 mmol/L (ref 22–32)
CREATININE: 0.95 mg/dL (ref 0.44–1.00)
Chloride: 103 mmol/L (ref 98–111)
GFR, Estimated: >60 mL/min (ref >60)
Glucose, Bld: 96 mg/dL (ref 70–99)
Potassium: 4.2 mmol/L (ref 3.5–5.1)
Sodium: 142 mmol/L (ref 135–145)
TOTAL PROTEIN: 7.6 g/dL (ref 6.5–8.1)

## 2018-02-02 LAB — CBC WITH DIFFERENTIAL (CANCER CENTER ONLY)
Abs Immature Granulocytes: 0.02 10*3/uL (ref 0.00–0.07)
Basophils Absolute: 0 10*3/uL (ref 0.0–0.1)
Basophils Relative: 1 %
EOS PCT: 2 %
Eosinophils Absolute: 0.1 10*3/uL (ref 0.0–0.5)
HEMATOCRIT: 35.5 % — AB (ref 36.0–46.0)
HEMOGLOBIN: 11.7 g/dL — AB (ref 12.0–15.0)
Immature Granulocytes: 0 %
LYMPHS ABS: 1.3 10*3/uL (ref 0.7–4.0)
LYMPHS PCT: 25 %
MCH: 29.8 pg (ref 26.0–34.0)
MCHC: 33 g/dL (ref 30.0–36.0)
MCV: 90.6 fL (ref 80.0–100.0)
MONO ABS: 0.5 10*3/uL (ref 0.1–1.0)
MONOS PCT: 9 %
Neutro Abs: 3.2 10*3/uL (ref 1.7–7.7)
Neutrophils Relative %: 63 %
Platelet Count: 245 10*3/uL (ref 150–400)
RBC: 3.92 MIL/uL (ref 3.87–5.11)
RDW: 12.4 % (ref 11.5–15.5)
WBC: 5.1 10*3/uL (ref 4.0–10.5)
nRBC: 0 % (ref 0.0–0.2)

## 2018-02-02 MED ORDER — HEPARIN SOD (PORK) LOCK FLUSH 100 UNIT/ML IV SOLN
INTRAVENOUS | Status: AC
  Administered 2018-02-02: 500 [IU] via INTRAVENOUS
  Filled 2018-02-02: qty 5

## 2018-02-02 MED ORDER — SODIUM CHLORIDE (PF) 0.9 % IJ SOLN
INTRAMUSCULAR | Status: AC
  Filled 2018-02-02: qty 50

## 2018-02-02 MED ORDER — IOHEXOL 300 MG/ML  SOLN
100.0000 mL | Freq: Once | INTRAMUSCULAR | Status: AC | PRN
  Administered 2018-02-02: 100 mL via INTRAVENOUS

## 2018-02-02 MED ORDER — HEPARIN SOD (PORK) LOCK FLUSH 100 UNIT/ML IV SOLN
500.0000 [IU] | Freq: Once | INTRAVENOUS | Status: AC
  Administered 2018-02-02: 500 [IU] via INTRAVENOUS

## 2018-02-03 ENCOUNTER — Telehealth: Payer: Self-pay | Admitting: Hematology and Oncology

## 2018-02-03 NOTE — Telephone Encounter (Signed)
I informed the patient that the CT chest abdomen pelvis was normal. No evidence of metastatic disease

## 2018-02-14 ENCOUNTER — Telehealth: Payer: Self-pay

## 2018-02-14 NOTE — Telephone Encounter (Signed)
Pt calling to report that she is unable to tolerate the joint aches and pain that comes with taking her letrozole medication. Pt would like to know if she can stop taking this today and switch to a different medication.   Told pt that she may stop taking it for 2 weeks and call back with updates on her symptoms to see if we need to switch her to a dfferent medication or modify letrozole intake. Pt verbalized understanding and will call back in 2 weeks.  No further needs at this time.

## 2018-03-04 ENCOUNTER — Other Ambulatory Visit: Payer: Self-pay

## 2018-03-04 MED ORDER — ANASTROZOLE 1 MG PO TABS: 1.0000 mg | ORAL_TABLET | Freq: Every day | ORAL | 0 refills | Status: DC

## 2018-03-04 NOTE — Progress Notes (Signed)
Pt called to report that her symptoms did improve after over 2 weeks of being off of letrozole, but still has some aches and pain that is tolerable at this time. Pt would like to switch to anastrozole if ok. Per Dr.Gudena, okay to get this switched to anastrozole. Told pt to take at night and to call and report how she is doing with the new medication. Pt verbalized understanding and has no further questions at this time.

## 2018-04-01 ENCOUNTER — Other Ambulatory Visit: Payer: Self-pay | Admitting: Hematology and Oncology

## 2018-04-15 ENCOUNTER — Telehealth: Payer: Self-pay | Admitting: *Deleted

## 2018-04-15 NOTE — Telephone Encounter (Signed)
On 04-15-18 fax medical records to [facility] 

## 2018-04-20 ENCOUNTER — Telehealth: Payer: Self-pay

## 2018-04-20 NOTE — Telephone Encounter (Signed)
Spoke with patient reminding of SCP visit with NP on 04/28/18 at 10 am.  Patient said she will come to appt.

## 2018-04-28 ENCOUNTER — Telehealth: Payer: Self-pay | Admitting: Hematology and Oncology

## 2018-04-28 ENCOUNTER — Inpatient Hospital Stay: Payer: 59 | Attending: Hematology and Oncology | Admitting: Adult Health

## 2018-04-28 VITALS — BP 97/70 | HR 76 | Temp 98.2°F | Resp 18 | Ht 66.0 in | Wt 154.0 lb

## 2018-04-28 DIAGNOSIS — C50811 Malignant neoplasm of overlapping sites of right female breast: Secondary | ICD-10-CM | POA: Diagnosis not present

## 2018-04-28 DIAGNOSIS — Z923 Personal history of irradiation: Secondary | ICD-10-CM

## 2018-04-28 DIAGNOSIS — Z9013 Acquired absence of bilateral breasts and nipples: Secondary | ICD-10-CM | POA: Diagnosis not present

## 2018-04-28 DIAGNOSIS — Z79811 Long term (current) use of aromatase inhibitors: Secondary | ICD-10-CM | POA: Diagnosis not present

## 2018-04-28 DIAGNOSIS — C50812 Malignant neoplasm of overlapping 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 breast: Secondary | ICD-10-CM | POA: Diagnosis not present

## 2018-04-28 DIAGNOSIS — C773 Secondary and unspecified malignant neoplasm of axilla and upper limb lymph nodes: Secondary | ICD-10-CM

## 2018-04-28 DIAGNOSIS — Z170 Estrogen receptor positive status [ER+]: Secondary | ICD-10-CM | POA: Diagnosis not present

## 2018-04-28 NOTE — Telephone Encounter (Signed)
Gave avs and calendar ° °

## 2018-04-28 NOTE — Progress Notes (Signed)
CLINIC:  Survivorship   REASON FOR VISIT:  Routine follow-up post-treatment for a recent history of breast cancer.  BRIEF ONCOLOGIC HISTORY:    Malignant neoplasm of overlapping 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 breast ([place])   01/12/2017 Surgery    Left mastectomy: IDC with DCIS, 2 tumors 3.6 cm ER 90%, PR 95%, Ki-67 5%, HER-2 negative ratio 1.32) and 1.2 cm (ER 95% PR 95% positive Her 2 Positive ratio 3.2, Ki-67 5%) T2 N0 stage Ib    02/16/2017 -  Chemotherapy    TCH x6 followed by Herceptin maintenance      Malignant neoplasm of overlapping sites of right breast in female, estrogen receptor positive ([place])   12/21/2016 Initial Diagnosis    Palpable lumps in the right breast with nipple inversion: Mammogram revealed skin thickening and distortion, ultrasound revealed 4 masses 3.1 cm at 11:00, 2.5 cm at 6:00, 4 cm at 8:00, 2.2 cm at 5:00 with 2 abnormal axillary lymph nodes: MRI revealed 7 cm abnormality on right breast, in addition 3.6 cm abnormality in the left breast and 2 additional masses 1 cm each; right breast: T4 N1 stage III B clinical stage; left breast: T2 N0 stage IB clinical stage    12/21/2016 Pathology Results    Right breast: Grade 1 Invasive lobular cancer, lymph node also positive, ER 70%, PR 90%, Ki-67 10%, HER-2 negative ratio 1.09 Left breast: Grade 1 IDC with DCIS prognostic panel pending    01/04/2017 Genetic Testing    The patient had genetic testing due to a personal history of bilateral breast caner and a family history of breast cancer.  The Multi-Cancer Panel was ordered. The Multi-Cancer Panel offered by [facility] includes sequencing and/or deletion duplication testing of the following 83 genes: ALK, APC, ATM, AXIN2,BAP1,  BARD1, BLM, BMPR1A, BRCA1, BRCA2, BRIP1, CASR, CDC73, CDH1, CDK4, CDKN1B, CDKN1C, CDKN2A (p14ARF), CDKN2A (p16INK4a), CEBPA, CHEK2, CTNNA1, DICER1, DIS3L2, EGFR (c.2369C>T, p.Thr790Met variant only), EPCAM (Deletion/duplication testing only), FH, FLCN,  GATA2, GPC3, GREM1 (Promoter region deletion/duplication testing only), HOXB13 (c.251G>A, p.Gly84Glu), HRAS, KIT, MAX, MEN1, MET, MITF (c.952G>A, p.Glu318Lys variant only), MLH1, MSH2, MSH3, MSH6, MUTYH, NBN, NF1, NF2, NTHL1, PALB2, PDGFRA, PHOX2B, PMS2, POLD1, POLE, POT1, PRKAR1A, PTCH1, PTEN, RAD50, RAD51C, RAD51D, RB1, RECQL4, RET, RUNX1, SDHAF2, SDHA (sequence changes only), SDHB, SDHC, SDHD, SMAD4, SMARCA4, SMARCB1, SMARCE1, STK11, SUFU, TERC, TERT, TMEM127, TP53, TSC1, TSC2, VHL, WRN and WT1.   Results: Negative, no pathogenic variants identified.  The date of this test report is 01/04/2017.      01/12/2017 Surgery    Right mastectomy: Invasive and in situ lobular carcinoma involves the dermis of the skin of the nipple, margins negative, 3/3 lymph nodes positive, grade 1, EF 75%, PR 95%, HER-2 negative ratio 1.19, Ki-67 10%, T3N1A stage IIa    02/16/2017 - 06/09/2017 Chemotherapy    TCH x6 cycles followed by Herceptin maintenance    08/10/2017 - 09/22/2017 Radiation Therapy    Adjuvant radiation    09/24/2017 -  Anti-estrogen oral therapy    Letrozole 2.5 mg daily, changed to Anastrozole over 02/2018 due to arthralgias     INTERVAL HISTORY:  Amanda Williams presents to the [facility] today for our initial meeting to review her survivorship care plan detailing her treatment course for breast cancer, as well as monitoring long-term side effects of that treatment, education regarding health maintenance, screening, and overall wellness and health promotion.     Overall, Amanda Williams reports feeling quite well.  Over Christmas she switched from Letrozole to  Anastrozole.  She still has some stiffness, but not as significantly as she did prior.  She is exercising regularly.      REVIEW OF SYSTEMS:  Review of Systems  Constitutional: Negative for appetite change, chills, fatigue, fever and unexpected weight change.  HENT:   Negative for hearing loss, lump/mass, mouth sores and trouble  swallowing.   Eyes: Negative for eye problems and icterus.  Respiratory: Negative for chest tightness, cough and shortness of breath.   Cardiovascular: Negative for chest pain, leg swelling and palpitations.  Gastrointestinal: Negative for abdominal distention, abdominal pain, constipation, diarrhea, nausea and vomiting.  Endocrine: Positive for hot flashes.  Genitourinary: Negative for difficulty urinating.   Musculoskeletal: Positive for arthralgias.  Skin: Negative for itching and rash.  Neurological: Negative for dizziness, extremity weakness, headaches and numbness.  Hematological: Negative for adenopathy. Does not bruise/bleed easily.  Psychiatric/Behavioral: Negative for depression. The patient is not nervous/anxious.   Breast: Denies any new nodularity, masses, tenderness, nipple changes, or nipple discharge.    ONCOLOGY TREATMENT TEAM:  1. Surgeon:  Dr. Excell Seltzer at [facility] 2. Medical Oncologist: Dr. Lindi Adie  3. Radiation Oncologist: Dr. Lisbeth Renshaw    PAST MEDICAL/SURGICAL HISTORY:  Past Medical History:  Diagnosis Date  . Allergy   . Breast cancer ([place])   . Difficult intubation    see 01/12/17 anesthesia record  . Family history of breast cancer   . Hematuria    ? trigonitis  . History of kidney stones   . Panic attack    echo 2009 normal   Past Surgical History:  Procedure Laterality Date  . BREAST BIOPSY     left  . BREAST RECONSTRUCTION WITH PLACEMENT OF TISSUE EXPANDER AND FLEX HD (ACELLULAR HYDRATED DERMIS) Bilateral 01/12/2017   Procedure: BREAST RECONSTRUCTION WITH PLACEMENT OF TISSUE EXPANDER AND ALLODERM;  Surgeon: Irene Limbo, MD;  Location: [facility];  Service: Plastics;  Laterality: Bilateral;  . COLONOSCOPY    . LIPOSUCTION WITH LIPOFILLING Bilateral 11/02/2017   Procedure: lipofilling from abdomen to bilateral chest;  Surgeon: Irene Limbo, MD;  Location: [facility];  Service: Plastics;  Laterality:  Bilateral;  . MASTECTOMY W/ SENTINEL NODE BIOPSY Bilateral 01/12/2017   Procedure: RIGHT MODIFIED RADICAL MASTECTOMY, LEFT TOTAL MASTECTOMY WITH LEFT SENTINEL LYMPH NODE BIOPSY;  Surgeon: Rolm Bookbinder, MD;  Location: [facility];  Service: General;  Laterality: Bilateral;  . PORTACATH PLACEMENT N/A 02/15/2017   Procedure: INSERTION PORT-A-CATH WITH [place];  Surgeon: Rolm Bookbinder, MD;  Location: [facility];  Service: General;  Laterality: N/A;  . REMOVAL OF TISSUE EXPANDER AND PLACEMENT OF IMPLANT Bilateral 07/16/2017   Procedure: BILATERAL REMOVAL OF TISSUE EXPANDER AND PLACEMENT OF SILICONE IMPLANT;  Surgeon: Irene Limbo, MD;  Location: [facility];  Service: Plastics;  Laterality: Bilateral;     ALLERGIES:  Allergies  Allergen Reactions  . Penicillins Hives and Other (See Comments)    Has patient had a PCN reaction causing immediate rash, facial/tongue/throat swelling, SOB or lightheadedness with hypotension: No HAS PT DEVELOPED SEVERE RASH INVOLVING MUCUS MEMBRANES or SKIN NECROSIS: #  #  #  YES  #  #  #   Has patient had a PCN reaction that required hospitalization: No Has patient had a PCN reaction occurring within the last 10 years: No If all of the above answers are "NO", then may proceed with Cephalosporin use.   . Sulfonamide Derivatives Hives  . Adhesive [Tape] Other (See Comments)    Redness  CURRENT MEDICATIONS:  Outpatient Encounter Medications as of 04/28/2018  Medication Sig  . acetaminophen (TYLENOL) 500 MG tablet Take 1,000 mg by mouth 2 (two) times daily as needed for moderate pain (for pain/headaches.).   Marland Kitchen anastrozole (ARIMIDEX) 1 MG tablet TAKE 1 TABLET BY MOUTH EVERY DAY  . ibuprofen (ADVIL,MOTRIN) 200 MG tablet Take 400 mg by mouth 2 (two) times daily as needed for moderate pain (for pain/headaches).   . lidocaine-prilocaine (EMLA) cream Apply to affected area once (Patient not taking: Reported on 04/28/2018)   No facility-administered  encounter medications on file as of 04/28/2018.      ONCOLOGIC FAMILY HISTORY:  Family History  Problem Relation Age of Onset  . Breast cancer Maternal Grandmother 87     GENETIC COUNSELING/TESTING: See above  SOCIAL HISTORY:  Social History   Socioeconomic History  . Marital status: Married    Spouse name: Not on file  . Number of children: Not on file  . Years of education: Not on file  . Highest education level: Not on file  Occupational History  . Not on file  Social Needs  . Financial resource strain: Not on file  . Food insecurity:    Worry: Not on file    Inability: Not on file  . Transportation needs:    Medical: Not on file    Non-medical: Not on file  Tobacco Use  . Smoking status: Never Smoker  . Smokeless tobacco: Never Used  Substance and Sexual Activity  . Alcohol use: Yes    Alcohol/week: 4.0 standard drinks    Types: 4 Glasses of wine per week    Comment: ocassionally  . Drug use: No  . Sexual activity: Not on file    Comment: Husband has had vasectomy  Lifestyle  . Physical activity:    Days per week: Not on file    Minutes per session: Not on file  . Stress: Not on file  Relationships  . Social connections:    Talks on phone: Not on file    Gets together: Not on file    Attends religious service: Not on file    Active member of club or organization: Not on file    Attends meetings of clubs or organizations: Not on file    Relationship status: Not on file  . Intimate partner violence:    Fear of current or ex partner: Not on file    Emotionally abused: Not on file    Physically abused: Not on file    Forced sexual activity: Not on file  Other Topics Concern  . Not on file  Social History Narrative   hhof  4    Pets dog and cat.   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    Has smoke detector and wears seat belts.  No firearms. No excess sun exposure. Sees dentist regularly .            PHYSICAL  EXAMINATION:  Vital Signs:   Vitals:   04/28/18 1339  BP: 97/70  Pulse: 76  Resp: 18  Temp: 98.2 F (36.8 C)  SpO2: 100%   Filed Weights   04/28/18 1339  Weight: 154 lb (69.9 kg)   General: Well-nourished, well-appearing female in no acute distress.  She is unaccompanied today.   HEENT: Head is normocephalic.  Pupils equal and reactive to light. Conjunctivae  clear without exudate.  Sclerae anicteric. Oral mucosa is pink, moist.  Oropharynx is pink without lesions or erythema.  Lymph: No cervical, supraclavicular, or infraclavicular lymphadenopathy noted on palpation.  Cardiovascular: Regular rate and rhythm.Marland Kitchen Respiratory: Clear to auscultation bilaterally. Chest expansion symmetric; breathing non-labored.  Breasts: s/p bilateral mastectomy, right breast s/p radiation.  No sign of local recurrence bilaterally GI: Abdomen soft and round; non-tender, non-distended. Bowel sounds normoactive.  GU: Deferred.  Neuro: No focal deficits. Steady gait.  Psych: Mood and affect normal and appropriate for situation.  Extremities: No edema. MSK: No focal spinal tenderness to palpation.  Full range of motion in bilateral upper extremities Skin: Warm and dry.  LABORATORY DATA:  None for this visit.  DIAGNOSTIC IMAGING:  None for this visit.      ASSESSMENT AND PLAN:  Ms.. Williams is a pleasant 53 y.o. 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  She presents to the Survivorship Clinic for our initial meeting and routine follow-up post-completion of treatment for breast cancer.    1. Bilateral breast cancer:  Amanda Williams is continuing to recover from definitive treatment for breast  cancer. She will follow-up with her medical oncologist, Dr. Lindi Adie in 6 months with history and physical exam per surveillance protocol.  She will continue her anti-estrogen therapy with Anastrozole.l. Today, a comprehensive survivorship care plan and treatment summary was reviewed with the patient today detailing her breast cancer diagnosis, treatment course, potential late/long-term effects of treatment, appropriate follow-up care with recommendations for the future, and patient education resources.  A copy of this summary, along with a letter will be sent to the patient's primary care provider via mail/fax/In Basket message after today's visit.    2. Bone health:  Given Amanda Williams's age/history of breast cancer and her current treatment regimen including anti-estrogen therapy with Anastrozole, she is at risk for bone demineralization.  Her last DEXA scan was 12/24/2017 and showed a normal bone density.  In the meantime, she was encouraged to increase her consumption of foods rich in calcium, as well as increase her weight-bearing activities.  She was given education on specific activities to promote bone health.  3. Cancer screening:  Due to Amanda Williams's history and her age, she should receive screening for skin cancers, colon cancer, and gynecologic cancers.  The information and recommendations are listed on the patient's comprehensive care plan/treatment summary and were reviewed in detail with the patient.    4. Health maintenance and wellness promotion: Amanda Williams was encouraged to consume 5-7 servings of fruits and vegetables per day. We reviewed the "Nutrition Rainbow" handout, as well as the handout "Take Control of Your Health and Reduce Your Cancer Risk" from the [facility].  She was also encouraged to engage in moderate to vigorous exercise for 30 minutes per day most days of the week. We discussed the [employer] fitness program, which is designed for cancer survivors to help them  become more physically fit after cancer treatments.  She was instructed to limit her alcohol consumption and continue to abstain from tobacco use.     5. Support services/counseling: It is not uncommon for this period of the patient's cancer care trajectory to be one of many emotions and stressors.  We discussed an opportunity for  her to participate in the next session of [facility] ("Finding Your New Normal") support group series designed for patients after they have completed treatment.   Amanda Williams was encouraged to take advantage of our many other support services programs, support groups, and/or counseling in coping with her new life as a cancer survivor after completing anti-cancer treatment.  She was offered support today through active listening and expressive supportive counseling.  She was given information regarding our available services and encouraged to contact me with any questions or for help enrolling in any of our support group/programs.    Dispo:   -Return to cancer center for f/u with Dr. Lindi Adie in 10/2018 -DEXA due 12/2019 -Follow up with Dr. Donne Hazel.   -She is welcome to return back to the Survivorship Clinic at any time; no additional follow-up needed at this time.  -Consider referral back to survivorship as a long-term survivor for continued surveillance  A total of (30) minutes of face-to-face time was spent with this patient with greater than 50% of that time in counseling and care-coordination.   Gardenia Phlegm, NP Survivorship Program [facility] [place] [phone]   Note: PRIMARY CARE PROVIDER Burnis Medin, [place] [phone]

## 2018-05-02 ENCOUNTER — Encounter: Payer: Self-pay | Admitting: Adult Health

## 2018-05-30 ENCOUNTER — Encounter: Payer: Self-pay | Admitting: Internal Medicine

## 2018-05-30 ENCOUNTER — Ambulatory Visit (INDEPENDENT_AMBULATORY_CARE_PROVIDER_SITE_OTHER): Payer: 59

## 2018-05-30 ENCOUNTER — Ambulatory Visit: Payer: 59 | Admitting: Internal Medicine

## 2018-05-30 VITALS — BP 124/68 | HR 74 | Temp 98.7°F | Wt 156.5 lb

## 2018-05-30 DIAGNOSIS — C50011 Malignant neoplasm of nipple and areola, right female breast: Secondary | ICD-10-CM

## 2018-05-30 DIAGNOSIS — Z23 Encounter for immunization: Secondary | ICD-10-CM | POA: Diagnosis not present

## 2018-05-30 DIAGNOSIS — C50012 Malignant neoplasm of nipple and areola, left female breast: Secondary | ICD-10-CM

## 2018-05-30 DIAGNOSIS — R6889 Other general symptoms and signs: Secondary | ICD-10-CM

## 2018-05-30 NOTE — Progress Notes (Signed)
Chief Complaint  Patient presents with  . lump in throat    several weeks ago.Pt states its right in the middle and states it noticeable no dificulty swallowing     HPI: Amanda Williams 53 y.o. come in for SDA    Last seen by   For CPX in feb 2018  And since then has undergone rx for bilateral  breast cancer and reconstruction  She is currently in [facility] and stable  Seen 2 6 20.  Stable     Feels like lump mid lower   Anterior Neck    Just wants to make sure not improtant to her health   No gi swallowing ing effect and no  Pain  Cough   Seems to notice when going to sleep.   But no choking and not related to position.   Exercise   No  cv or reps sx     No med s. Remote hx of heart burn  But not similar    ROS: See pertinent positives and negatives per HPI. No unintended  Weight loss   Fever   Past Medical History:  Diagnosis Date  . Allergy   . Breast cancer ([place])   . Difficult intubation    see 01/12/17 anesthesia record  . Family history of breast cancer   . Hematuria    ? trigonitis  . History of kidney stones   . Panic attack    echo 2009 normal    Family History  Problem Relation Age of Onset  . Breast cancer Maternal Grandmother 87    Social History   Socioeconomic History  . Marital status: Married    Spouse name: Not on file  . Number of children: Not on file  . Years of education: Not on file  . Highest education level: Not on file  Occupational History  . Not on file  Social Needs  . Financial resource strain: Not on file  . Food insecurity:    Worry: Not on file    Inability: Not on file  . Transportation needs:    Medical: Not on file    Non-medical: Not on file  Tobacco Use  . Smoking status: Never Smoker  . Smokeless tobacco: Never Used  Substance and Sexual Activity  . Alcohol use: Yes    Alcohol/week: 4.0 standard drinks    Types: 4 Glasses of wine per week    Comment: ocassionally  . Drug use: No  . Sexual activity: Not  on file    Comment: Husband has had vasectomy  Lifestyle  . Physical activity:    Days per week: Not on file    Minutes per session: Not on file  . Stress: Not on file  Relationships  . Social connections:    Talks on phone: Not on file    Gets together: Not on file    Attends religious service: Not on file    Active member of club or organization: Not on file    Attends meetings of clubs or organizations: Not on file    Relationship status: Not on file  Other Topics Concern  . Not on file  Social History Narrative   hhof  4    Pets dog and cat.   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    Has smoke detector and wears seat belts.  No firearms. No excess sun exposure. Sees dentist regularly .           Outpatient Medications Prior to Visit  Medication Sig Dispense Refill  . acetaminophen (TYLENOL) 500 MG tablet Take 1,000 mg by mouth 2 (two) times daily as needed for moderate pain (for pain/headaches.).     Marland Kitchen anastrozole (ARIMIDEX) 1 MG tablet TAKE 1 TABLET BY MOUTH EVERY DAY 90 tablet 0  . ibuprofen (ADVIL,MOTRIN) 200 MG tablet Take 400 mg by mouth 2 (two) times daily as needed for moderate pain (for pain/headaches).     . lidocaine-prilocaine (EMLA) cream Apply to affected area once (Patient not taking: Reported on 04/28/2018) 30 g 0   No facility-administered medications prior to visit.      EXAM:  BP 124/68 (BP Location: Right Arm, Patient Position: Sitting, Cuff Size: Normal)   Pulse 74   Temp 98.7 F (37.1 C) (Oral)   Wt 156 lb 8 oz (71 kg)   LMP 03/14/2017 Comment: chemo therapy   SpO2 97%   BMI 25.26 kg/m   Body mass index is 25.26 kg/m.  GENERAL: vitals reviewed and listed above, alert, oriented, appears well hydrated and in no acute distress HEENT: atraumatic, conjunctiva  clear, no obvious abnormalities on inspection of external nose and ears tms clear  OP : no lesion edema or exudate    NECK: no obvious masses on  inspection palpation   ?palp thyroid  Area of concern  supraclavicular notch  area  clar on palpation  LUNGS: clear to auscultation bilaterally, no wheezes, rales or rhonchi, good air movement CV: HRRR, no clubbing cyanosis or  peripheral edema nl cap refill  Abdomen:  Sof,t normal bowel sounds without hepatosplenomegaly, no guarding rebound or masses no CVA tenderness MS: moves all extremities without noticeable focal  abnormality PSYCH: pleasant and cooperative, no obvious depression or anxiety Lab Results  Component Value Date   WBC 5.1 02/02/2018   HGB 11.7 (L) 02/02/2018   HCT 35.5 (L) 02/02/2018   PLT 245 02/02/2018   GLUCOSE 96 02/02/2018   CHOL 195 05/12/2016   TRIG 107.0 05/12/2016   HDL 60.20 05/12/2016   LDLCALC 114 (H) 05/12/2016   ALT 15 02/02/2018   AST 17 02/02/2018   NA 142 02/02/2018   K 4.2 02/02/2018   CL 103 02/02/2018   CREATININE 0.95 02/02/2018   BUN 15 02/02/2018   CO2 29 02/02/2018   TSH 3.23 05/12/2016   BP Readings from Last 3 Encounters:  05/30/18 124/68  04/28/18 97/70  01/26/18 103/69   Wt Readings from Last 3 Encounters:  05/30/18 156 lb 8 oz (71 kg)  04/28/18 154 lb (69.9 kg)  01/26/18 152 lb 3.2 oz (69 kg)     ASSESSMENT AND PLAN:  Discussed the following assessment and plan:  Throat symptom - Plan: DG Chest 2 View  Bilateral malignant neoplasm involving both nipple and areola in female, unspecified estrogen receptor status ([place]) - Plan: DG Chest 2 View  Need for tetanus, diphtheria, and acellular pertussis (Tdap) vaccine - Plan: Tdap vaccine greater than or equal to 7yo IM No systemic sx  Uncertain cause  ? Globus vs  Reflux vs    Motility  Vs Thyroid     Empiric ppi and rov in 3 weeks or so  tdap today ok  Total visit 50mins > 50% spent counseling and coordinating care as indicated in above note and in instructions to patient .    -Patient advised to return or  notify health care team  if  new concerns arise.  Patient  Instructions  This could be  A version of reflux   Take omeprazole   20 mg once a day 30 min before eating  for 3 weeks then ROV  .   To assess  Exam is reassuring but we can  Do further  eval if needed  .       Standley Brooking.  M.D.

## 2018-05-30 NOTE — Patient Instructions (Addendum)
This could be  A version of reflux   Take omeprazole   20 mg once a day 30 min before eating  for 3 weeks then ROV  .   To assess  Exam is reassuring but we can  Do further  eval if needed  .

## 2018-06-06 ENCOUNTER — Other Ambulatory Visit: Payer: Self-pay | Admitting: Hematology and Oncology

## 2018-08-01 ENCOUNTER — Encounter: Payer: Self-pay | Admitting: Internal Medicine

## 2018-08-01 ENCOUNTER — Ambulatory Visit (INDEPENDENT_AMBULATORY_CARE_PROVIDER_SITE_OTHER): Payer: 59 | Admitting: Internal Medicine

## 2018-08-01 ENCOUNTER — Other Ambulatory Visit: Payer: Self-pay

## 2018-08-01 DIAGNOSIS — C50012 Malignant neoplasm of nipple and areola, left female breast: Secondary | ICD-10-CM | POA: Diagnosis not present

## 2018-08-01 DIAGNOSIS — M79604 Pain in right leg: Secondary | ICD-10-CM

## 2018-08-01 DIAGNOSIS — M545 Low back pain, unspecified: Secondary | ICD-10-CM

## 2018-08-01 DIAGNOSIS — C50011 Malignant neoplasm of nipple and areola, right female breast: Secondary | ICD-10-CM | POA: Diagnosis not present

## 2018-08-01 MED ORDER — PREDNISONE 20 MG PO TABS: ORAL_TABLET | ORAL | 0 refills | Status: DC

## 2018-08-01 NOTE — Progress Notes (Signed)
Virtual Visit via Video Note  I connected with@ on 08/01/18 at 11:00 AM EDT by a video enabled telemedicine application and verified that I am speaking with the correct person using two identifiers. Location patient: home Location provider:work or home office Persons participating in the virtual visit: patient, provider  [place]Ith national recommendations  regarding COVID 19 pandemic   video visit is advised over in office visit for this patient.  Patient aware  of the limitations of evaluation and management by telemedicine and  availability of in person appointments. and agreed to proceed.   HPI: Amanda Williams presents for video visit For back pain . About 10 days ago    Pulled out back after doing home project  And used her  Heat ice advil 400 - 600 and some improvement but then 3-4 days later had inc pain lateral right lower that radiated to lateral hip medical thigh and down to knee and caused   Inc  Pain at night throbbing  Down leg  and hard to sleep laying down     Walking a bit better   Oxycodone at night not that hellful  Not comfortable unless almost sitting up .   ROS: See pertinent positives and negatives per HPI. No fever rash hx of same  Weakness    Past Medical History:  Diagnosis Date  . Allergy   . Breast cancer ([place])   . Difficult intubation    see 01/12/17 anesthesia record  . Family history of breast cancer   . Hematuria    ? trigonitis  . History of kidney stones   . Panic attack    echo 2009 normal    Past Surgical History:  Procedure Laterality Date  . BREAST BIOPSY     left  . BREAST RECONSTRUCTION WITH PLACEMENT OF TISSUE EXPANDER AND FLEX HD (ACELLULAR HYDRATED DERMIS) Bilateral 01/12/2017   Procedure: BREAST RECONSTRUCTION WITH PLACEMENT OF TISSUE EXPANDER AND ALLODERM;  Surgeon: Irene Limbo, MD;  Location: [facility];  Service: Plastics;  Laterality: Bilateral;  . COLONOSCOPY    . LIPOSUCTION WITH LIPOFILLING Bilateral 11/02/2017    Procedure: lipofilling from abdomen to bilateral chest;  Surgeon: Irene Limbo, MD;  Location: [facility];  Service: Plastics;  Laterality: Bilateral;  . MASTECTOMY W/ SENTINEL NODE BIOPSY Bilateral 01/12/2017   Procedure: RIGHT MODIFIED RADICAL MASTECTOMY, LEFT TOTAL MASTECTOMY WITH LEFT SENTINEL LYMPH NODE BIOPSY;  Surgeon: Rolm Bookbinder, MD;  Location: [facility];  Service: General;  Laterality: Bilateral;  . PORTACATH PLACEMENT N/A 02/15/2017   Procedure: INSERTION PORT-A-CATH WITH [place];  Surgeon: Rolm Bookbinder, MD;  Location: [facility];  Service: General;  Laterality: N/A;  . REMOVAL OF TISSUE EXPANDER AND PLACEMENT OF IMPLANT Bilateral 07/16/2017   Procedure: BILATERAL REMOVAL OF TISSUE EXPANDER AND PLACEMENT OF SILICONE IMPLANT;  Surgeon: Irene Limbo, MD;  Location: [facility];  Service: Plastics;  Laterality: Bilateral;    Family History  Problem Relation Age of Onset  . Breast cancer Maternal Grandmother 87    Social History   Tobacco Use  . Smoking status: Never Smoker  . Smokeless tobacco: Never Used  Substance Use Topics  . Alcohol use: Yes    Alcohol/week: 4.0 standard drinks    Types: 4 Glasses of wine per week    Comment: ocassionally  . Drug use: No      Current Outpatient Medications:  .  acetaminophen (TYLENOL) 500 MG tablet, Take 1,000 mg by mouth 2 (two) times daily as  needed for moderate pain (for pain/headaches.). , Disp: , Rfl:  .  anastrozole (ARIMIDEX) 1 MG tablet, TAKE 1 TABLET BY MOUTH EVERY DAY, Disp: 30 tablet, Rfl: 2 .  ibuprofen (ADVIL,MOTRIN) 200 MG tablet, Take 400 mg by mouth 2 (two) times daily as needed for moderate pain (for pain/headaches). , Disp: , Rfl:  .  lidocaine-prilocaine (EMLA) cream, Apply to affected area once (Patient not taking: Reported on 04/28/2018), Disp: 30 g, Rfl: 0 .  predniSONE (DELTASONE) 20 MG tablet, Take 3 pills  per day by mouth  for 3 days,then 2 per day for 3 days, then 1 per  day for 3 days, then 1/2per day for 3 days ,, Disp: 24 tablet, Rfl: 0  EXAM: BP Readings from Last 3 Encounters:  05/30/18 124/68  04/28/18 97/70  01/26/18 103/69    VITALS per patient if applicable:  GENERAL: alert, oriented, appears well and in no acute distress  HEENT: atraumatic, conjunttiva clear, no obvious abnormalities on inspection of external nose and ears  NECK: normal movements of the head and neck  LUNGS: on inspection no signs of respiratory distress, breathing rate appears normal, no obvious gross SOB, gasping or wheezing  CV: no obvious cyanosis  MS: moves all visible extremities without noticeable abnormality points ti right si joint lateral hil and anterior  Medial leg    PSYCH/NEURO: pleasant and cooperative, no obvious depression or anxiety, speech and thought processing grossly intact Lab Results  Component Value Date   WBC 5.1 02/02/2018   HGB 11.7 (L) 02/02/2018   HCT 35.5 (L) 02/02/2018   PLT 245 02/02/2018   GLUCOSE 96 02/02/2018   CHOL 195 05/12/2016   TRIG 107.0 05/12/2016   HDL 60.20 05/12/2016   LDLCALC 114 (H) 05/12/2016   ALT 15 02/02/2018   AST 17 02/02/2018   NA 142 02/02/2018   K 4.2 02/02/2018   CL 103 02/02/2018   CREATININE 0.95 02/02/2018   BUN 15 02/02/2018   CO2 29 02/02/2018   TSH 3.23 05/12/2016    ASSESSMENT AND PLAN:  Discussed the following assessment and plan:  Low back pain radiating to right leg - ? SI to  L3 dermatome vs other causeHip?   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  Counseled.   Expectant management and discussion of plan and treatment with opportunity to ask questions and all were answered. The patient agreed with the plan and demonstrated an understanding of the instructions.   Advised to call back or seek an in-person evaluation if worsening  or having  further concerns . In the interim    Shanon Ace, MD

## 2018-08-12 ENCOUNTER — Other Ambulatory Visit: Payer: Self-pay

## 2018-08-12 ENCOUNTER — Telehealth: Payer: Self-pay | Admitting: *Deleted

## 2018-08-12 DIAGNOSIS — M545 Low back pain, unspecified: Secondary | ICD-10-CM

## 2018-08-12 DIAGNOSIS — M79604 Pain in right leg: Secondary | ICD-10-CM

## 2018-08-12 NOTE — Telephone Encounter (Signed)
Please refer to sports medicine  Dr Tamala Julian   May need some imaging studies and hands on evaluation

## 2018-08-12 NOTE — Telephone Encounter (Signed)
Copied from [place] [id number]. Topic: General - Other >> Aug 12, 2018 11:31 AM Celene Kras A wrote: Reason for CRM: Pt called stating she has finished her steroid medication. Pt states she is still in pain and is requesting advice for next steps wether that is physical therapy, or a steroid shot. Please advise.

## 2018-08-12 NOTE — Telephone Encounter (Signed)
lvm for pt stating referral to sports medicine has been made

## 2018-08-22 ENCOUNTER — Ambulatory Visit: Payer: 59 | Admitting: Orthopedic Surgery

## 2018-09-04 ENCOUNTER — Other Ambulatory Visit: Payer: Self-pay | Admitting: Hematology and Oncology

## 2018-10-20 ENCOUNTER — Telehealth: Payer: Self-pay | Admitting: Hematology and Oncology

## 2018-10-20 NOTE — Telephone Encounter (Signed)
I left a message regarding video visit  °

## 2018-10-20 NOTE — Assessment & Plan Note (Signed)
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 Adjuvant chemotherapy with TCH x 4 cycles completed on 06/09/2017 (Taxotere held from final 2 cycles)followed by Herceptin maintenance for 1 year 2. Adjuvant radiation therapystarted 08/10/2017-09/22/2017 3. Followed by adjuvant antiestrogen therapy __________________________________________________________________________________________ Echo on3/27/2019: 55% - 60% Current treatment:Herceptin maintenanceto be completed November 2019 Herceptin toxicities: None  Current treatment:Letrozole 2.5 mg daily started 09/24/2017  Letrozole toxicities:Denies any hot flashesbut complaining of muscle stiffness and achiness We discussed use of CBD oil.  Breast cancer surveillance: Mammograms will need to be performed on the right breast  Return to clinic in 1 year for follow-up

## 2018-10-26 ENCOUNTER — Telehealth: Payer: Self-pay | Admitting: Hematology and Oncology

## 2018-10-26 NOTE — Progress Notes (Signed)
HEMATOLOGY-ONCOLOGY MYCHART VIDEO VISIT PROGRESS NOTE  I connected with [name] on 10/27/2018 at 11:00 AM EDT by MyChart video conference and verified that I am speaking with the correct person using two identifiers.  I discussed the limitations, risks, security and privacy concerns of performing an evaluation and management service by MyChart and the availability of in person appointments.  I also discussed with the patient that there may be a patient responsible charge related to this service. The patient expressed understanding and agreed to proceed.  Patient's Location: Home Physician Location: Clinic  CHIEF COMPLIANT: Follow-up of bilateral breast cancers on anastrozole  INTERVAL HISTORY: Amanda Williams is a 53 y.o. female with above-mentioned history of bilateral breast cancers who underwent bilateral mastectomies, adjuvant chemotherapy, radiation and is currently on anti-estrogen therapy with anastrozole. I last saw her 9 months ago and in the interim she was seen by Wilber Bihari, NP for survivorship clinic. She presents over MyChart today for follow-up.   Oncology History  Malignant neoplasm of overlapping 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 breast ([place])  01/12/2017 Surgery   Left mastectomy: IDC with DCIS, 2 tumors 3.6 cm ER 90%, PR 95%, Ki-67 5%, HER-2 negative ratio 1.32) and 1.2 cm (ER 95% PR 95% positive Her 2 Positive ratio 3.2, Ki-67 5%) T2 N0 stage Ib   02/16/2017 -  Chemotherapy   TCH x6 followed by Herceptin maintenance    Malignant neoplasm of overlapping sites of right breast in female, estrogen receptor positive ([place])  12/21/2016 Initial Diagnosis   Palpable lumps in the right breast with nipple inversion: Mammogram revealed skin thickening and distortion, ultrasound revealed 4 masses 3.1 cm at 11:00, 2.5 cm at 6:00, 4 cm at 8:00, 2.2 cm at 5:00 with 2 abnormal axillary lymph nodes: MRI revealed 7 cm abnormality on right breast, in addition 3.6 cm abnormality in the left breast  and 2 additional masses 1 cm each; right breast: T4 N1 stage III B clinical stage; left breast: T2 N0 stage IB clinical stage   12/21/2016 Pathology Results   Right breast: Grade 1 Invasive lobular cancer, lymph node also positive, ER 70%, PR 90%, Ki-67 10%, HER-2 negative ratio 1.09 Left breast: Grade 1 IDC with DCIS prognostic panel pending   01/04/2017 Genetic Testing   The patient had genetic testing due to a personal history of bilateral breast caner and a family history of breast cancer.  The Multi-Cancer Panel was ordered. The Multi-Cancer Panel offered by [facility] includes sequencing and/or deletion duplication testing of the following 83 genes: ALK, APC, ATM, AXIN2,BAP1,  BARD1, BLM, BMPR1A, BRCA1, BRCA2, BRIP1, CASR, CDC73, CDH1, CDK4, CDKN1B, CDKN1C, CDKN2A (p14ARF), CDKN2A (p16INK4a), CEBPA, CHEK2, CTNNA1, DICER1, DIS3L2, EGFR (c.2369C>T, p.Thr790Met variant only), EPCAM (Deletion/duplication testing only), FH, FLCN, GATA2, GPC3, GREM1 (Promoter region deletion/duplication testing only), HOXB13 (c.251G>A, p.Gly84Glu), HRAS, KIT, MAX, MEN1, MET, MITF (c.952G>A, p.Glu318Lys variant only), MLH1, MSH2, MSH3, MSH6, MUTYH, NBN, NF1, NF2, NTHL1, PALB2, PDGFRA, PHOX2B, PMS2, POLD1, POLE, POT1, PRKAR1A, PTCH1, PTEN, RAD50, RAD51C, RAD51D, RB1, RECQL4, RET, RUNX1, SDHAF2, SDHA (sequence changes only), SDHB, SDHC, SDHD, SMAD4, SMARCA4, SMARCB1, SMARCE1, STK11, SUFU, TERC, TERT, TMEM127, TP53, TSC1, TSC2, VHL, WRN and WT1.   Results: Negative, no pathogenic variants identified.  The date of this test report is 01/04/2017.     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 the dermis of the skin of the nipple, margins negative, 3/3 lymph nodes positive, grade 1, EF 75%, PR 95%, HER-2 negative ratio 1.19,  Ki-67 10%, T3N1A stage IIa   02/16/2017 - 06/09/2017 Chemotherapy   TCH x6 cycles followed by Herceptin maintenance   08/10/2017 - 09/22/2017 Radiation Therapy    Adjuvant radiation   09/24/2017 -  Anti-estrogen oral therapy   Letrozole 2.5 mg daily, changed to Anastrozole over 02/2018 due to arthralgias     REVIEW OF SYSTEMS:   Constitutional: Denies fevers, chills or abnormal weight loss Eyes: Denies blurriness of vision Ears, nose, mouth, throat, and face: Denies mucositis or sore throat Respiratory: Denies cough, dyspnea or wheezes Cardiovascular: Denies palpitation, chest discomfort Gastrointestinal:  Denies nausea, heartburn or change in bowel habits Skin: Denies abnormal skin rashes Lymphatics: Denies new lymphadenopathy or easy bruising Neurological:Denies numbness, tingling or new weaknesses Behavioral/Psych: Mood is stable, no new changes  Extremities: No lower extremity edema Breast: denies any pain or lumps or nodules in either breasts All other systems were reviewed with the patient and are negative.  Observations/Objective:  There were no vitals filed for this visit. There is no height or weight on file to calculate BMI.  I have reviewed the data as listed CMP Latest Ref Rng & Units 02/02/2018 01/26/2018 12/15/2017  Glucose 70 - 99 mg/dL 96 108(H) 99  BUN 6 - 20 mg/dL _0 Creatinine 0.44 - 1.00 mg/dL 0.95 0.89 0.81  Sodium 135 - 145 mmol/L 142 140 141  Potassium 3.5 - 5.1 mmol/L 4.2 4.1 4.1  Chloride 98 - 111 mmol/L 103 105 104  CO2 22 - 32 mmol/L _1 Calcium 8.9 - 10.3 mg/dL 9.6 9.3 9.5  Total Protein 6.5 - 8.1 g/dL 7.6 7.1 7.4  Total Bilirubin 0.3 - 1.2 mg/dL 0.3 0.4 0.5  Alkaline Phos 38 - 126 U/L 113 103 116  AST 15 - 41 U/L 17 14(L) 36  ALT 0 - 44 U/L 15 13 37    Lab Results  Component Value Date   WBC 5.1 02/02/2018   HGB 11.7 (L) 02/02/2018   HCT 35.5 (L) 02/02/2018   MCV 90.6 02/02/2018   PLT 245 02/02/2018   NEUTROABS 3.2 02/02/2018      Assessment Plan:  Malignant neoplasm of overlapping 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 breast ([place]) 01/12/2017:Left mastectomy: IDC with DCIS, 2 tumors 3.6 cm ER 90%, PR  95%, Ki-67 5%, HER-2 negative ratio 1.32) and 1.2 cm (ER 95% PR 95% positive Her 2 Positive ratio 3.2, Ki-67 5%) T2 N0 stage Ib Right mastectomy: 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 Adjuvant chemotherapy with TCH x 4 cycles completed on 06/09/2017 (Taxotere held from final 2 cycles)followed by Herceptin maintenance for 1 year 2. Adjuvant radiation therapystarted 08/10/2017-09/22/2017 3. Followed by adjuvant antiestrogen therapy __________________________________________________________________________________________ Echo on3/27/2019: 55% - 60% Current treatment:Herceptin maintenanceto be completed November 2019 Herceptin toxicities: None  Current treatment:Letrozole 2.5 mg daily started 09/24/2017 switched to anastrozole Jan 2020 Helped with joint  pains  Anastrozole toxicities:Muscle pains and hot flashes better  Breast cancer surveillance: Mammograms will need to be performed on the right breast  Return to clinic in 1 year for follow-up    I discussed the assessment and treatment plan with the patient. The patient was provided an opportunity to ask questions and all were answered. The patient agreed with the plan and demonstrated an understanding of the instructions. The patient was advised  to call back or seek an in-person evaluation if the symptoms worsen or if the condition fails to improve as anticipated.   I provided 15 minutes of face-to-face MyChart video visit time during this encounter.    Rulon Eisenmenger, MD 10/27/2018   I, Molly Dorshimer, am acting as scribe for Nicholas Lose, MD.  I have reviewed the above documentation for accuracy and completeness, and I agree with the above.

## 2018-10-26 NOTE — Telephone Encounter (Signed)
Left message for patient to verify mychart visit for pre reg °

## 2018-10-27 ENCOUNTER — Inpatient Hospital Stay: Payer: 59 | Attending: Hematology and Oncology | Admitting: Hematology and Oncology

## 2018-10-27 ENCOUNTER — Telehealth: Payer: Self-pay | Admitting: Hematology and Oncology

## 2018-10-27 DIAGNOSIS — C50811 Malignant neoplasm of overlapping sites of right female breast: Secondary | ICD-10-CM | POA: Insufficient documentation

## 2018-10-27 DIAGNOSIS — Z79811 Long term (current) use of aromatase inhibitors: Secondary | ICD-10-CM | POA: Insufficient documentation

## 2018-10-27 DIAGNOSIS — Z923 Personal history of irradiation: Secondary | ICD-10-CM | POA: Insufficient documentation

## 2018-10-27 DIAGNOSIS — Z1501 Genetic susceptibility to malignant neoplasm of breast: Secondary | ICD-10-CM | POA: Insufficient documentation

## 2018-10-27 DIAGNOSIS — Z9013 Acquired absence of bilateral breasts and nipples: Secondary | ICD-10-CM | POA: Insufficient documentation

## 2018-10-27 DIAGNOSIS — C50812 Malignant neoplasm of overlapping 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 breast: Secondary | ICD-10-CM | POA: Diagnosis not present

## 2018-10-27 DIAGNOSIS — Z9221 Personal history of antineoplastic chemotherapy: Secondary | ICD-10-CM | POA: Insufficient documentation

## 2018-10-27 DIAGNOSIS — Z170 Estrogen receptor positive status [ER+]: Secondary | ICD-10-CM | POA: Diagnosis not present

## 2018-10-27 MED ORDER — ANASTROZOLE 1 MG PO TABS: 1.0000 mg | ORAL_TABLET | Freq: Every day | ORAL | 3 refills | Status: DC

## 2018-10-27 MED ORDER — CALCIUM CARBONATE 1500 (600 CA) MG PO TABS: 1500.0000 mg | ORAL_TABLET | Freq: Every day | ORAL | 0 refills | Status: DC

## 2018-10-27 NOTE — Telephone Encounter (Signed)
I talk with patient regarding schedule  

## 2018-12-06 LAB — HM PAP SMEAR

## 2018-12-06 LAB — HM MAMMOGRAPHY

## 2018-12-27 ENCOUNTER — Encounter: Payer: Self-pay | Admitting: Internal Medicine

## 2019-01-18 ENCOUNTER — Other Ambulatory Visit: Payer: Self-pay

## 2019-01-18 DIAGNOSIS — Z20822 Contact with and (suspected) exposure to covid-19: Secondary | ICD-10-CM

## 2019-01-19 LAB — NOVEL CORONAVIRUS, NAA: SARS-CoV-2, NAA: NOT DETECTED

## 2019-10-26 NOTE — Progress Notes (Signed)
Patient Care Team: Panosh, Standley Brooking, MD as PCP - General Delsa Bern, MD (Obstetrics and Gynecology) Excell Seltzer, MD (Inactive) as Consulting Physician (General Surgery) Nicholas Lose, MD as Consulting Physician (Hematology and Oncology) Kyung Rudd, MD as Consulting Physician (Radiation Oncology) Gardenia Phlegm, NP as Nurse Practitioner (Hematology and Oncology)  DIAGNOSIS:    ICD-10-CM   1. Malignant neoplasm of overlapping sites of right breast in female, estrogen receptor positive ([place])  C50.811    Z17.0     SUMMARY OF ONCOLOGIC HISTORY: Oncology History  Malignant neoplasm of overlapping 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 breast ([place])  01/12/2017 Surgery   Left mastectomy: IDC with DCIS, 2 tumors 3.6 cm ER 90%, PR 95%, Ki-67 5%, HER-2 negative ratio 1.32) and 1.2 cm (ER 95% PR 95% positive Her 2 Positive ratio 3.2, Ki-67 5%) T2 N0 stage Ib   02/16/2017 -  Chemotherapy   TCH x6 followed by Herceptin maintenance    Malignant neoplasm of overlapping sites of right breast in female, estrogen receptor positive ([place])  12/21/2016 Initial Diagnosis   Palpable lumps in the right breast with nipple inversion: Mammogram revealed skin thickening and distortion, ultrasound revealed 4 masses 3.1 cm at 11:00, 2.5 cm at 6:00, 4 cm at 8:00, 2.2 cm at 5:00 with 2 abnormal axillary lymph nodes: MRI revealed 7 cm abnormality on right breast, in addition 3.6 cm abnormality in the left breast and 2 additional masses 1 cm each; right breast: T4 N1 stage III B clinical stage; left breast: T2 N0 stage IB clinical stage   12/21/2016 Pathology Results   Right breast: Grade 1 Invasive lobular cancer, lymph node also positive, ER 70%, PR 90%, Ki-67 10%, HER-2 negative ratio 1.09 Left breast: Grade 1 IDC with DCIS prognostic panel pending   01/04/2017 Genetic Testing   The patient had genetic testing due to a personal history of bilateral breast caner and a family history of breast cancer.  The  Multi-Cancer Panel was ordered. The Multi-Cancer Panel offered by [facility] includes sequencing and/or deletion duplication testing of the following 83 genes: ALK, APC, ATM, AXIN2,BAP1,  BARD1, BLM, BMPR1A, BRCA1, BRCA2, BRIP1, CASR, CDC73, CDH1, CDK4, CDKN1B, CDKN1C, CDKN2A (p14ARF), CDKN2A (p16INK4a), CEBPA, CHEK2, CTNNA1, DICER1, DIS3L2, EGFR (c.2369C>T, p.Thr790Met variant only), EPCAM (Deletion/duplication testing only), FH, FLCN, GATA2, GPC3, GREM1 (Promoter region deletion/duplication testing only), HOXB13 (c.251G>A, p.Gly84Glu), HRAS, KIT, MAX, MEN1, MET, MITF (c.952G>A, p.Glu318Lys variant only), MLH1, MSH2, MSH3, MSH6, MUTYH, NBN, NF1, NF2, NTHL1, PALB2, PDGFRA, PHOX2B, PMS2, POLD1, POLE, POT1, PRKAR1A, PTCH1, PTEN, RAD50, RAD51C, RAD51D, RB1, RECQL4, RET, RUNX1, SDHAF2, SDHA (sequence changes only), SDHB, SDHC, SDHD, SMAD4, SMARCA4, SMARCB1, SMARCE1, STK11, SUFU, TERC, TERT, TMEM127, TP53, TSC1, TSC2, VHL, WRN and WT1.   Results: Negative, no pathogenic variants identified.  The date of this test report is 01/04/2017.     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 with above-mentioned  history of bilateral breast cancers who underwent bilateral mastectomies, adjuvant chemotherapy, radiation and is currently on anti-estrogen therapy with anastrozole. She presents to the clinic today for follow-up.    ALLERGIES:  is allergic to penicillins, sulfonamide derivatives, and adhesive [tape].  MEDICATIONS:  Current Outpatient Medications  Medication Sig Dispense Refill  . acetaminophen (TYLENOL) 500 MG tablet Take 1,000 mg by mouth 2 (two) times daily as needed for moderate pain (for pain/headaches.).     Marland Kitchen anastrozole (ARIMIDEX) 1 MG tablet Take 1 tablet (1 mg total) by mouth daily. 90 tablet 3  . calcium carbonate (CALTRATE 600) 1500 (600 Ca) MG TABS tablet Take 1 tablet (1,500 mg total) by mouth daily with breakfast.  0  . ibuprofen (ADVIL,MOTRIN) 200 MG tablet Take 400 mg by mouth 2 (two) times daily as needed for moderate pain (for pain/headaches).      No current facility-administered medications for this visit.    PHYSICAL EXAMINATION: ECOG PERFORMANCE STATUS: 1 - Symptomatic but completely ambulatory  Vitals:   10/27/19 1018  BP: 94/64  Pulse: 80  Resp: 18  Temp: 98.5 F (36.9 C)  SpO2: 99%   Filed Weights   10/27/19 1018  Weight: 157 lb 6.4 oz (71.4 kg)    BREAST: No palpable masses or nodules in either right or left breasts. No palpable axillary supraclavicular or infraclavicular adenopathy no breast tenderness or nipple discharge. (exam performed in the presence of a chaperone)  LABORATORY DATA:  I have reviewed the data as listed CMP Latest Ref Rng & Units 02/02/2018 01/26/2018 12/15/2017  Glucose 70 - 99 mg/dL 96 108(H) 99  BUN 6 - 20 mg/dL 15 15 16   Creatinine 0.44 - 1.00 mg/dL 0.95 0.89 0.81  Sodium 135 - 145 mmol/L 142 140 141  Potassium 3.5 - 5.1 mmol/L 4.2 4.1 4.1  Chloride 98 - 111 mmol/L 103 105 104  CO2 22 - 32 mmol/L 29 26 27   Calcium 8.9 - 10.3 mg/dL 9.6 9.3 9.5  Total Protein 6.5 - 8.1 g/dL 7.6 7.1 7.4  Total Bilirubin 0.3 - 1.2 mg/dL 0.3 0.4 0.5  Alkaline Phos 38 - 126 U/L 113 103 116  AST 15 - 41 U/L 17 14(L) 36  ALT 0 - 44 U/L 15 13 37    Lab Results  Component Value Date   WBC 5.1 02/02/2018   HGB 11.7 (L) 02/02/2018   HCT 35.5 (L)  02/02/2018   MCV 90.6 02/02/2018   PLT 245 02/02/2018   NEUTROABS 3.2 02/02/2018    ASSESSMENT & PLAN:  Malignant neoplasm of overlapping sites of right breast in female, estrogen receptor positive ([place]) 01/12/2017:Left mastectomy: IDC with DCIS, 2 tumors 3.6 cm ER 90%, PR 95%, Ki-67 5%, HER-2 negative ratio 1.32) and 1.2 cm (ER 95% PR 95% positive Her 2 Positive ratio 3.2, Ki-67 5%) T2 N0 stage Ib Right mastectomy: 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 Adjuvant chemotherapy with TCH x 4 cycles completed on 06/09/2017 (Taxotere held from final 2 cycles)followed by Herceptin maintenance for 1 year 2. Adjuvant radiation therapystarted 08/10/2017-09/22/2017 3. Followed by adjuvant antiestrogen therapy __________________________________________________________________________________________ Echo on3/27/2019: 55% - 60% Current treatment:Herceptin maintenanceto be completed November 2019 Herceptin toxicities: None  Current treatment:Letrozole 2.5 mg daily started 09/24/2017 switched to anastrozole Jan 2020 Helped with joint  pains  Anastrozole toxicities:Muscle  pains and hot flashes better  Breast cancer surveillance: Mammograms right breast 12/06/2018 Breast exam 10/27/2019: Benign  Return to clinic in 1 year for follow-up     No orders of the defined types were placed in this encounter.  The patient has a good understanding of the overall plan. she agrees with it. she will call with any problems that may develop before the next visit here.  Total time spent: 20 mins including face to face time and time spent for planning, charting and coordination of care  Nicholas Lose, MD 10/27/2019  I, Cloyde Reams Dorshimer, am acting as scribe for Dr. Nicholas Lose.  I have reviewed the above  documentation for accuracy and completeness, and I agree with the above.

## 2019-10-27 ENCOUNTER — Inpatient Hospital Stay: Payer: 59 | Attending: Hematology and Oncology | Admitting: Hematology and Oncology

## 2019-10-27 ENCOUNTER — Other Ambulatory Visit: Payer: Self-pay

## 2019-10-27 ENCOUNTER — Telehealth: Payer: Self-pay | Admitting: Hematology and Oncology

## 2019-10-27 DIAGNOSIS — Z79811 Long term (current) use of aromatase inhibitors: Secondary | ICD-10-CM | POA: Diagnosis not present

## 2019-10-27 DIAGNOSIS — C50811 Malignant neoplasm of overlapping sites of right female breast: Secondary | ICD-10-CM

## 2019-10-27 DIAGNOSIS — Z9013 Acquired absence of bilateral breasts and nipples: Secondary | ICD-10-CM | POA: Diagnosis not present

## 2019-10-27 DIAGNOSIS — C50812 Malignant neoplasm of overlapping 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 breast: Secondary | ICD-10-CM | POA: Diagnosis not present

## 2019-10-27 DIAGNOSIS — Z9221 Personal history of antineoplastic chemotherapy: Secondary | ICD-10-CM | POA: Insufficient documentation

## 2019-10-27 DIAGNOSIS — Z170 Estrogen receptor positive status [ER+]: Secondary | ICD-10-CM | POA: Diagnosis not present

## 2019-10-27 DIAGNOSIS — Z923 Personal history of irradiation: Secondary | ICD-10-CM | POA: Diagnosis not present

## 2019-10-27 MED ORDER — ANASTROZOLE 1 MG PO TABS: 1.0000 mg | ORAL_TABLET | Freq: Every day | ORAL | 3 refills | Status: DC

## 2019-10-27 NOTE — Assessment & Plan Note (Signed)
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 Adjuvant chemotherapy with TCH x 4 cycles completed on 06/09/2017 (Taxotere held from final 2 cycles)followed by Herceptin maintenance for 1 year 2. Adjuvant radiation therapystarted 08/10/2017-09/22/2017 3. Followed by adjuvant antiestrogen therapy __________________________________________________________________________________________ Echo on3/27/2019: 55% - 60% Current treatment:Herceptin maintenanceto be completed November 2019 Herceptin toxicities: None  Current treatment:Letrozole 2.5 mg daily started 09/24/2017 switched to anastrozole Jan 2020 Helped with joint  pains  Anastrozole toxicities:Muscle pains and hot flashes better  Breast cancer surveillance: Mammograms right breast 12/06/2018 Breast exam 10/27/2019: Benign  Return to clinic in 1 year for follow-up

## 2019-10-27 NOTE — Telephone Encounter (Signed)
Scheduled appts per 8/6 los. Gave pt a print out of AVS.  

## 2019-12-04 NOTE — Telephone Encounter (Signed)
Please set her up for virtual visit    And we can decide on medication help  ( last visit with [place] was over a  Year ago )

## 2019-12-05 ENCOUNTER — Telehealth (INDEPENDENT_AMBULATORY_CARE_PROVIDER_SITE_OTHER): Payer: 59 | Admitting: Internal Medicine

## 2019-12-05 ENCOUNTER — Encounter: Payer: Self-pay | Admitting: Internal Medicine

## 2019-12-05 ENCOUNTER — Other Ambulatory Visit: Payer: Self-pay

## 2019-12-05 VITALS — Ht 66.0 in | Wt 154.0 lb

## 2019-12-05 DIAGNOSIS — Z79899 Other long term (current) drug therapy: Secondary | ICD-10-CM | POA: Diagnosis not present

## 2019-12-05 DIAGNOSIS — F410 Panic disorder [episodic paroxysmal anxiety] without agoraphobia: Secondary | ICD-10-CM | POA: Diagnosis not present

## 2019-12-05 DIAGNOSIS — Z6379 Other stressful life events affecting family and household: Secondary | ICD-10-CM

## 2019-12-05 MED ORDER — SERTRALINE HCL 50 MG PO TABS: ORAL_TABLET | ORAL | 2 refills | Status: DC

## 2019-12-05 NOTE — Progress Notes (Signed)
Virtual Visit via Video Note  I connected with@ on 12/05/19 at  4:00 PM EDT by a video enabled telemedicine application and verified that I am speaking with the correct person using two identifiers. Location patient: vehicle Location provider: home office Persons participating in the virtual visit: patient, provider  WIth national recommendations  regarding COVID 19 pandemic   video visit is advised over in office visit for this patient.  Patient aware  of the limitations of evaluation and management by telemedicine and  availability of in person appointments. and agreed to proceed.   HPI: Amanda Williams presents for video visit   For consdiering  Restart  Medication for panic attack suppression  See my cha t message    I would like to resume taking Sertraline.   You may recall I had taken it for years for panic attacks.  After my cancer treatment was complete in the fall of 2019, I discontinued it.  Currently, I'm caring for my 61+ year old grandparents and it's been overwhelming.   While I haven't yet had a full blown panic attack, I have had a number of episodes that felt like precursors to one.  In the past, my panic attacks would start with what felt like a tsunami of dread.  I have experienced that on multiple occasions recently.  I'm happy to set up an appointment if you feel that's warranted.   I just want to address this as soon as possible.   Past hx of good response to  Sertraline prob 100 mg at some time and no sig untoward side effects   .  Otherwise  Sleeping well  Td  2 etoh on weekends at most  No excess caffiene.  No sig depression mostly anxiety .  ROS: See pertinent positives and negatives per HPI.  Past Medical History:  Diagnosis Date  . Allergy   . Breast cancer ([place])   . Difficult intubation    see 01/12/17 anesthesia record  . Family history of breast cancer   . Hematuria    ? trigonitis  . History of kidney stones   . Panic attack    echo 2009 normal     Past Surgical History:  Procedure Laterality Date  . BREAST BIOPSY     left  . BREAST RECONSTRUCTION WITH PLACEMENT OF TISSUE EXPANDER AND FLEX HD (ACELLULAR HYDRATED DERMIS) Bilateral 01/12/2017   Procedure: BREAST RECONSTRUCTION WITH PLACEMENT OF TISSUE EXPANDER AND ALLODERM;  Surgeon: Irene Limbo, MD;  Location: [facility];  Service: Plastics;  Laterality: Bilateral;  . COLONOSCOPY    . LIPOSUCTION WITH LIPOFILLING Bilateral 11/02/2017   Procedure: lipofilling from abdomen to bilateral chest;  Surgeon: Irene Limbo, MD;  Location: [place];  Service: Plastics;  Laterality: Bilateral;  . MASTECTOMY W/ SENTINEL NODE BIOPSY Bilateral 01/12/2017   Procedure: RIGHT MODIFIED RADICAL MASTECTOMY, LEFT TOTAL MASTECTOMY WITH LEFT SENTINEL LYMPH NODE BIOPSY;  Surgeon: Rolm Bookbinder, MD;  Location: [place] [facility];  Service: General;  Laterality: Bilateral;  . PORTACATH PLACEMENT N/A 02/15/2017   Procedure: INSERTION PORT-A-CATH WITH [place];  Surgeon: Rolm Bookbinder, MD;  Location: [place];  Service: General;  Laterality: N/A;  . REMOVAL OF TISSUE EXPANDER AND PLACEMENT OF IMPLANT Bilateral 07/16/2017   Procedure: BILATERAL REMOVAL OF TISSUE EXPANDER AND PLACEMENT OF SILICONE IMPLANT;  Surgeon: Irene Limbo, MD;  Location: [facility];  Service: Plastics;  Laterality: Bilateral;    Family History  Problem Relation Age of Onset  .  Breast cancer Maternal Grandmother 87    Social History   Tobacco Use  . Smoking status: Never Smoker  . Smokeless tobacco: Never Used  Vaping Use  . Vaping Use: Never used  Substance Use Topics  . Alcohol use: Yes    Alcohol/week: 4.0 standard drinks    Types: 4 Glasses of wine per week    Comment: ocassionally  . Drug use: No      Current Outpatient Medications:  .  acetaminophen (TYLENOL) 500 MG tablet, Take 1,000 mg by mouth as needed for moderate pain (for pain/headaches.). , Disp: , Rfl:  .   anastrozole (ARIMIDEX) 1 MG tablet, Take 1 tablet (1 mg total) by mouth daily., Disp: 90 tablet, Rfl: 3 .  calcium carbonate (CALTRATE 600) 1500 (600 Ca) MG TABS tablet, Take 1 tablet (1,500 mg total) by mouth daily with breakfast., Disp: , Rfl: 0 .  ibuprofen (ADVIL,MOTRIN) 200 MG tablet, Take 400 mg by mouth as needed for moderate pain (for pain/headaches). , Disp: , Rfl:  .  MULTIPLE VITAMIN PO, Take 1 tablet by mouth daily., Disp: , Rfl:  .  sertraline (ZOLOFT) 50 MG tablet, Take 25 mg by mouth daily: increase to 50 mg per day as directed ., Disp: 30 tablet, Rfl: 2  EXAM: BP Readings from Last 3 Encounters:  10/27/19 94/64  05/30/18 124/68  04/28/18 97/70    VITALS per patient if applicable:  GENERAL: alert, oriented, appears well and in no acute distress HEENT: atraumatic, conjunttiva clear, no obvious abnormalities on inspection of external nose and ears NECK: normal movements of the head and neck LUNGS: on inspection no signs of respiratory distress, breathing rate appears normal, no obvious gross SOB, gasping or wheezing CV: no obvious cyanosis MS: moves all visible extremities without noticeable abnormality PSYCH/NEURO: pleasant and cooperative, no obvious depression or anxiety, speech and thought processing grossly intact   ASSESSMENT AND PLAN:  Discussed the following assessment and plan:    ICD-10-CM   1. Stress due to illness of family member  Z63.79   2. Panic disorder  F41.0   3. Medication management  Z79.899    Medically stable  rx sertraline 25 to 50 mg and then fu vv ok in 3+ weeks  If out of town  Can have fu  Med check later  When back in town  [place].   Expectant management and discussion of plan and treatment with opportunity to ask questions and all were answered. The patient agreed with the plan and demonstrated an understanding of the instructions.  record review and med review  And plan   Counsel   32 minutes  Advised to call back or seek an  in-person evaluation if worsening  or having  further concerns . Return for in 3-6 weeks when convenient .  Shanon Ace, MD

## 2019-12-12 LAB — HM PAP SMEAR

## 2019-12-27 ENCOUNTER — Other Ambulatory Visit: Payer: Self-pay | Admitting: Internal Medicine

## 2019-12-27 NOTE — Telephone Encounter (Signed)
Patient is requesting 90 days?   Can you send if okay?

## 2020-01-08 ENCOUNTER — Encounter: Payer: Self-pay | Admitting: Hematology and Oncology

## 2020-01-08 ENCOUNTER — Encounter: Payer: Self-pay | Admitting: Internal Medicine

## 2020-01-08 LAB — HM DEXA SCAN

## 2020-01-12 ENCOUNTER — Encounter: Payer: Self-pay | Admitting: Internal Medicine

## 2020-04-23 DEATH — deceased

## 2020-05-09 DIAGNOSIS — M5137 Other intervertebral disc degeneration, lumbosacral region: Secondary | ICD-10-CM | POA: Diagnosis not present

## 2020-05-09 DIAGNOSIS — M5136 Other intervertebral disc degeneration, lumbar region: Secondary | ICD-10-CM | POA: Diagnosis not present

## 2020-05-09 DIAGNOSIS — M5116 Intervertebral disc disorders with radiculopathy, lumbar region: Secondary | ICD-10-CM | POA: Diagnosis not present

## 2020-05-14 DIAGNOSIS — M5116 Intervertebral disc disorders with radiculopathy, lumbar region: Secondary | ICD-10-CM | POA: Diagnosis not present

## 2020-05-14 DIAGNOSIS — M5136 Other intervertebral disc degeneration, lumbar region: Secondary | ICD-10-CM | POA: Diagnosis not present

## 2020-05-14 DIAGNOSIS — M5137 Other intervertebral disc degeneration, lumbosacral region: Secondary | ICD-10-CM | POA: Diagnosis not present

## 2020-05-16 DIAGNOSIS — M5136 Other intervertebral disc degeneration, lumbar region: Secondary | ICD-10-CM | POA: Diagnosis not present

## 2020-05-16 DIAGNOSIS — M5116 Intervertebral disc disorders with radiculopathy, lumbar region: Secondary | ICD-10-CM | POA: Diagnosis not present

## 2020-05-16 DIAGNOSIS — M5137 Other intervertebral disc degeneration, lumbosacral region: Secondary | ICD-10-CM | POA: Diagnosis not present

## 2020-05-20 DIAGNOSIS — M5116 Intervertebral disc disorders with radiculopathy, lumbar region: Secondary | ICD-10-CM | POA: Diagnosis not present

## 2020-05-20 DIAGNOSIS — M5136 Other intervertebral disc degeneration, lumbar region: Secondary | ICD-10-CM | POA: Diagnosis not present

## 2020-05-20 DIAGNOSIS — M5137 Other intervertebral disc degeneration, lumbosacral region: Secondary | ICD-10-CM | POA: Diagnosis not present

## 2020-05-21 ENCOUNTER — Encounter: Payer: Self-pay | Admitting: Family Medicine

## 2020-05-21 ENCOUNTER — Telehealth (INDEPENDENT_AMBULATORY_CARE_PROVIDER_SITE_OTHER): Payer: BC Managed Care – PPO | Admitting: Family Medicine

## 2020-05-21 VITALS — Temp 102.0°F | Wt 148.0 lb

## 2020-05-21 DIAGNOSIS — B349 Viral infection, unspecified: Secondary | ICD-10-CM | POA: Diagnosis not present

## 2020-05-21 NOTE — Progress Notes (Signed)
Subjective:    Patient ID: Amanda Williams, female    DOB: Sep 10, 1965, 55 y.o.   MRN: [id number]  HPI Virtual Visit via Video Note  I connected with the patient on 05/21/20 at  1:15 PM EST by a video enabled telemedicine application and verified that I am speaking with the correct person using two identifiers.  Location patient: home Location provider:work or home office Persons participating in the virtual visit: patient, provider  I discussed the limitations of evaluation and management by telemedicine and the availability of in person appointments. The patient expressed understanding and agreed to proceed.   HPI: Here for 3 days of a ST, which was worst on the first day but it better now. Now for 2 days she also has fever to 102 degree and body aches. No cough or SOB. No NVD. She is taking an OTC medication that has acetaminophen, phenylephrine, guaifenecin, and dextromethorphan in it. She tested negative with an at home Covid test this morning.    ROS: See pertinent positives and negatives per HPI.  Past Medical History:  Diagnosis Date  . Allergy   . Breast cancer ([place])   . Difficult intubation    see 01/12/17 anesthesia record  . Family history of breast cancer   . Hematuria    ? trigonitis  . History of kidney stones   . Panic attack    echo 2009 normal    Past Surgical History:  Procedure Laterality Date  . BREAST BIOPSY     left  . BREAST RECONSTRUCTION WITH PLACEMENT OF TISSUE EXPANDER AND FLEX HD (ACELLULAR HYDRATED DERMIS) Bilateral 01/12/2017   Procedure: BREAST RECONSTRUCTION WITH PLACEMENT OF TISSUE EXPANDER AND ALLODERM;  Surgeon: Irene Limbo, MD;  Location: [facility];  Service: Plastics;  Laterality: Bilateral;  . COLONOSCOPY    . LIPOSUCTION WITH LIPOFILLING Bilateral 11/02/2017   Procedure: lipofilling from abdomen to bilateral chest;  Surgeon: Irene Limbo, MD;  Location: [facility];  Service: Plastics;   Laterality: Bilateral;  . MASTECTOMY W/ SENTINEL NODE BIOPSY Bilateral 01/12/2017   Procedure: RIGHT MODIFIED RADICAL MASTECTOMY, LEFT TOTAL MASTECTOMY WITH LEFT SENTINEL LYMPH NODE BIOPSY;  Surgeon: Rolm Bookbinder, MD;  Location: [facility];  Service: General;  Laterality: Bilateral;  . PORTACATH PLACEMENT N/A 02/15/2017   Procedure: INSERTION PORT-A-CATH WITH [place];  Surgeon: Rolm Bookbinder, MD;  Location: [place];  Service: General;  Laterality: N/A;  . REMOVAL OF TISSUE EXPANDER AND PLACEMENT OF IMPLANT Bilateral 07/16/2017   Procedure: BILATERAL REMOVAL OF TISSUE EXPANDER AND PLACEMENT OF SILICONE IMPLANT;  Surgeon: Irene Limbo, MD;  Location: [facility];  Service: Plastics;  Laterality: Bilateral;    Family History  Problem Relation Age of Onset  . Breast cancer Maternal Grandmother 28     Current Outpatient Medications:  .  acetaminophen (TYLENOL) 500 MG tablet, Take 1,000 mg by mouth as needed for moderate pain (for pain/headaches.). , Disp: , Rfl:  .  anastrozole (ARIMIDEX) 1 MG tablet, Take 1 tablet (1 mg total) by mouth daily., Disp: 90 tablet, Rfl: 3 .  calcium carbonate (CALTRATE 600) 1500 (600 Ca) MG TABS tablet, Take 1 tablet (1,500 mg total) by mouth daily with breakfast., Disp: , Rfl: 0 .  ibuprofen (ADVIL,MOTRIN) 200 MG tablet, Take 400 mg by mouth as needed for moderate pain (for pain/headaches). , Disp: , Rfl:  .  MULTIPLE VITAMIN PO, Take 1 tablet by mouth daily., Disp: , Rfl:  .  sertraline (ZOLOFT) 50 MG  tablet, TAKE 1/2 TABLET BY MOUTH DAILY. INCREASE TO 1 TABLET DAILY AS DIRECTED, Disp: 90 tablet, Rfl: 1  EXAM:  VITALS per patient if applicable:  GENERAL: alert, oriented, appears well and in no acute distress  HEENT: atraumatic, conjunttiva clear, no obvious abnormalities on inspection of external nose and ears  NECK: normal movements of the head and neck  LUNGS: on inspection no signs of respiratory distress, breathing rate appears normal,  no obvious gross SOB, gasping or wheezing  CV: no obvious cyanosis  MS: moves all visible extremities without noticeable abnormality  PSYCH/NEURO: pleasant and cooperative, no obvious depression or anxiety, speech and thought processing grossly intact  ASSESSMENT AND PLAN: Viral illness, most likely Covid or influenza. She will drink plenty of fluids. She can add ibuprofen every 6 hours as needed. She should quarantine for 10 days. Alysia Penna, MD  Discussed the following assessment and plan:  No diagnosis found.     I discussed the assessment and treatment plan with the patient. The patient was provided an opportunity to ask questions and all were answered. The patient agreed with the plan and demonstrated an understanding of the instructions.   The patient was advised to call back or seek an in-person evaluation if the symptoms worsen or if the condition fails to improve as anticipated.     Review of Systems     Objective:   Physical Exam        Assessment & Plan:

## 2020-05-22 DIAGNOSIS — M5137 Other intervertebral disc degeneration, lumbosacral region: Secondary | ICD-10-CM | POA: Diagnosis not present

## 2020-05-22 DIAGNOSIS — M5136 Other intervertebral disc degeneration, lumbar region: Secondary | ICD-10-CM | POA: Diagnosis not present

## 2020-05-22 DIAGNOSIS — M5116 Intervertebral disc disorders with radiculopathy, lumbar region: Secondary | ICD-10-CM | POA: Diagnosis not present

## 2020-05-28 DIAGNOSIS — M5137 Other intervertebral disc degeneration, lumbosacral region: Secondary | ICD-10-CM | POA: Diagnosis not present

## 2020-05-28 DIAGNOSIS — M5116 Intervertebral disc disorders with radiculopathy, lumbar region: Secondary | ICD-10-CM | POA: Diagnosis not present

## 2020-05-28 DIAGNOSIS — M5136 Other intervertebral disc degeneration, lumbar region: Secondary | ICD-10-CM | POA: Diagnosis not present

## 2020-05-29 DIAGNOSIS — Z170 Estrogen receptor positive status [ER+]: Secondary | ICD-10-CM | POA: Diagnosis not present

## 2020-05-29 DIAGNOSIS — C50812 Malignant neoplasm of overlapping 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 breast: Secondary | ICD-10-CM | POA: Diagnosis not present

## 2020-05-29 DIAGNOSIS — C50811 Malignant neoplasm of overlapping sites of right female breast: Secondary | ICD-10-CM | POA: Diagnosis not present

## 2020-05-30 DIAGNOSIS — M5136 Other intervertebral disc degeneration, lumbar region: Secondary | ICD-10-CM | POA: Diagnosis not present

## 2020-05-30 DIAGNOSIS — M5137 Other intervertebral disc degeneration, lumbosacral region: Secondary | ICD-10-CM | POA: Diagnosis not present

## 2020-05-30 DIAGNOSIS — M5116 Intervertebral disc disorders with radiculopathy, lumbar region: Secondary | ICD-10-CM | POA: Diagnosis not present

## 2020-06-03 DIAGNOSIS — M5116 Intervertebral disc disorders with radiculopathy, lumbar region: Secondary | ICD-10-CM | POA: Diagnosis not present

## 2020-06-03 DIAGNOSIS — M5137 Other intervertebral disc degeneration, lumbosacral region: Secondary | ICD-10-CM | POA: Diagnosis not present

## 2020-06-03 DIAGNOSIS — M5136 Other intervertebral disc degeneration, lumbar region: Secondary | ICD-10-CM | POA: Diagnosis not present

## 2020-06-05 DIAGNOSIS — M5136 Other intervertebral disc degeneration, lumbar region: Secondary | ICD-10-CM | POA: Diagnosis not present

## 2020-06-05 DIAGNOSIS — M5116 Intervertebral disc disorders with radiculopathy, lumbar region: Secondary | ICD-10-CM | POA: Diagnosis not present

## 2020-06-05 DIAGNOSIS — M5137 Other intervertebral disc degeneration, lumbosacral region: Secondary | ICD-10-CM | POA: Diagnosis not present

## 2020-06-06 DIAGNOSIS — D2361 Other benign neoplasm of skin of right upper limb, including shoulder: Secondary | ICD-10-CM | POA: Diagnosis not present

## 2020-06-06 DIAGNOSIS — L578 Other skin changes due to chronic exposure to nonionizing radiation: Secondary | ICD-10-CM | POA: Diagnosis not present

## 2020-06-06 DIAGNOSIS — L570 Actinic keratosis: Secondary | ICD-10-CM | POA: Diagnosis not present

## 2020-06-06 DIAGNOSIS — D225 Melanocytic nevi of trunk: Secondary | ICD-10-CM | POA: Diagnosis not present

## 2020-06-06 DIAGNOSIS — L821 Other seborrheic keratosis: Secondary | ICD-10-CM | POA: Diagnosis not present

## 2020-06-11 DIAGNOSIS — M5137 Other intervertebral disc degeneration, lumbosacral region: Secondary | ICD-10-CM | POA: Diagnosis not present

## 2020-06-11 DIAGNOSIS — M5136 Other intervertebral disc degeneration, lumbar region: Secondary | ICD-10-CM | POA: Diagnosis not present

## 2020-06-11 DIAGNOSIS — M5116 Intervertebral disc disorders with radiculopathy, lumbar region: Secondary | ICD-10-CM | POA: Diagnosis not present

## 2020-06-17 DIAGNOSIS — M5137 Other intervertebral disc degeneration, lumbosacral region: Secondary | ICD-10-CM | POA: Diagnosis not present

## 2020-06-17 DIAGNOSIS — M5136 Other intervertebral disc degeneration, lumbar region: Secondary | ICD-10-CM | POA: Diagnosis not present

## 2020-06-17 DIAGNOSIS — M5116 Intervertebral disc disorders with radiculopathy, lumbar region: Secondary | ICD-10-CM | POA: Diagnosis not present

## 2020-07-02 DIAGNOSIS — M5137 Other intervertebral disc degeneration, lumbosacral region: Secondary | ICD-10-CM | POA: Diagnosis not present

## 2020-07-02 DIAGNOSIS — M5136 Other intervertebral disc degeneration, lumbar region: Secondary | ICD-10-CM | POA: Diagnosis not present

## 2020-07-02 DIAGNOSIS — M5116 Intervertebral disc disorders with radiculopathy, lumbar region: Secondary | ICD-10-CM | POA: Diagnosis not present

## 2020-07-05 ENCOUNTER — Other Ambulatory Visit: Payer: Self-pay | Admitting: Internal Medicine

## 2020-07-08 NOTE — Telephone Encounter (Signed)
Last OV: 12/05/2019 Last Refill: 12/27/2019 Disp: 90    R:  1 Future OV:  None scheduled   Is this ok to refill?

## 2020-07-23 DIAGNOSIS — M5116 Intervertebral disc disorders with radiculopathy, lumbar region: Secondary | ICD-10-CM | POA: Diagnosis not present

## 2020-07-23 DIAGNOSIS — M5137 Other intervertebral disc degeneration, lumbosacral region: Secondary | ICD-10-CM | POA: Diagnosis not present

## 2020-07-23 DIAGNOSIS — M5136 Other intervertebral disc degeneration, lumbar region: Secondary | ICD-10-CM | POA: Diagnosis not present

## 2020-08-13 DIAGNOSIS — S0181XA Laceration without foreign body of other part of head, initial encounter: Secondary | ICD-10-CM | POA: Diagnosis not present

## 2020-08-13 DIAGNOSIS — S199XXA Unspecified injury of neck, initial encounter: Secondary | ICD-10-CM | POA: Diagnosis not present

## 2020-08-13 DIAGNOSIS — Z23 Encounter for immunization: Secondary | ICD-10-CM | POA: Diagnosis not present

## 2020-08-13 DIAGNOSIS — S50312A Abrasion of left elbow, initial encounter: Secondary | ICD-10-CM | POA: Diagnosis not present

## 2020-08-13 DIAGNOSIS — S0990XA Unspecified injury of head, initial encounter: Secondary | ICD-10-CM | POA: Diagnosis not present

## 2020-08-13 DIAGNOSIS — R937 Abnormal findings on diagnostic imaging of other parts of musculoskeletal system: Secondary | ICD-10-CM | POA: Diagnosis not present

## 2020-08-13 DIAGNOSIS — S299XXA Unspecified injury of thorax, initial encounter: Secondary | ICD-10-CM | POA: Diagnosis not present

## 2020-08-13 DIAGNOSIS — W010XXA Fall on same level from slipping, tripping and stumbling without subsequent striking against object, initial encounter: Secondary | ICD-10-CM | POA: Diagnosis not present

## 2020-08-13 DIAGNOSIS — Z853 Personal history of malignant neoplasm of breast: Secondary | ICD-10-CM | POA: Diagnosis not present

## 2020-08-13 DIAGNOSIS — R519 Headache, unspecified: Secondary | ICD-10-CM | POA: Diagnosis not present

## 2020-08-13 DIAGNOSIS — R591 Generalized enlarged lymph nodes: Secondary | ICD-10-CM | POA: Diagnosis not present

## 2020-08-13 DIAGNOSIS — R079 Chest pain, unspecified: Secondary | ICD-10-CM | POA: Diagnosis not present

## 2020-08-13 DIAGNOSIS — R9389 Abnormal findings on diagnostic imaging of other specified body structures: Secondary | ICD-10-CM | POA: Diagnosis not present

## 2020-08-16 ENCOUNTER — Other Ambulatory Visit: Payer: Self-pay

## 2020-08-16 ENCOUNTER — Telehealth: Payer: Self-pay

## 2020-08-16 ENCOUNTER — Inpatient Hospital Stay
Admission: RE | Admit: 2020-08-16 | Discharge: 2020-08-16 | Disposition: A | Discharge status: Home / Self Care | Payer: Self-pay | Source: Ambulatory Visit | Attending: Hematology and Oncology | Admitting: Hematology and Oncology

## 2020-08-16 ENCOUNTER — Inpatient Hospital Stay: Payer: BC Managed Care – PPO

## 2020-08-16 ENCOUNTER — Other Ambulatory Visit: Payer: Self-pay | Admitting: Hematology and Oncology

## 2020-08-16 ENCOUNTER — Other Ambulatory Visit (HOSPITAL_COMMUNITY): Payer: Self-pay | Admitting: Hematology and Oncology

## 2020-08-16 ENCOUNTER — Inpatient Hospital Stay: Payer: BC Managed Care – PPO | Attending: Hematology and Oncology | Admitting: Hematology and Oncology

## 2020-08-16 ENCOUNTER — Ambulatory Visit
Admission: RE | Admit: 2020-08-16 | Discharge: 2020-08-16 | Disposition: A | Discharge status: Home / Self Care | Payer: Self-pay | Source: Ambulatory Visit | Attending: Hematology and Oncology | Admitting: Hematology and Oncology

## 2020-08-16 VITALS — BP 106/84 | HR 93 | Temp 97.7°F | Resp 18 | Ht 66.0 in | Wt 150.0 lb

## 2020-08-16 DIAGNOSIS — C801 Malignant (primary) neoplasm, unspecified: Secondary | ICD-10-CM

## 2020-08-16 DIAGNOSIS — Z170 Estrogen receptor positive status [ER+]: Secondary | ICD-10-CM

## 2020-08-16 DIAGNOSIS — C50812 Malignant neoplasm of overlapping 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 breast: Secondary | ICD-10-CM | POA: Insufficient documentation

## 2020-08-16 DIAGNOSIS — Z79811 Long term (current) use of aromatase inhibitors: Secondary | ICD-10-CM | POA: Diagnosis not present

## 2020-08-16 DIAGNOSIS — C50811 Malignant neoplasm of overlapping sites of right female breast: Secondary | ICD-10-CM

## 2020-08-16 DIAGNOSIS — M899 Disorder of bone, unspecified: Secondary | ICD-10-CM | POA: Diagnosis not present

## 2020-08-16 DIAGNOSIS — Z9221 Personal history of antineoplastic chemotherapy: Secondary | ICD-10-CM | POA: Insufficient documentation

## 2020-08-16 DIAGNOSIS — Z923 Personal history of irradiation: Secondary | ICD-10-CM | POA: Diagnosis not present

## 2020-08-16 DIAGNOSIS — R0789 Other chest pain: Secondary | ICD-10-CM

## 2020-08-16 DIAGNOSIS — Z9013 Acquired absence of bilateral breasts and nipples: Secondary | ICD-10-CM | POA: Diagnosis not present

## 2020-08-16 LAB — CBC WITH DIFFERENTIAL (CANCER CENTER ONLY)
Abs Immature Granulocytes: 0.03 10*3/uL (ref 0.00–0.07)
Basophils Absolute: 0 10*3/uL (ref 0.0–0.1)
Basophils Relative: 0 %
Eosinophils Absolute: 0 10*3/uL (ref 0.0–0.5)
Eosinophils Relative: 1 %
HCT: 39.2 % (ref 36.0–46.0)
Hemoglobin: 13 g/dL (ref 12.0–15.0)
Immature Granulocytes: 0 %
Lymphocytes Relative: 19 %
Lymphs Abs: 1.3 10*3/uL (ref 0.7–4.0)
MCH: 29.8 pg (ref 26.0–34.0)
MCHC: 33.2 g/dL (ref 30.0–36.0)
MCV: 89.9 fL (ref 80.0–100.0)
Monocytes Absolute: 0.5 10*3/uL (ref 0.1–1.0)
Monocytes Relative: 8 %
Neutro Abs: 5 10*3/uL (ref 1.7–7.7)
Neutrophils Relative %: 72 %
Platelet Count: 296 10*3/uL (ref 150–400)
RBC: 4.36 MIL/uL (ref 3.87–5.11)
RDW: 12.5 % (ref 11.5–15.5)
WBC Count: 7 10*3/uL (ref 4.0–10.5)
nRBC: 0 % (ref 0.0–0.2)

## 2020-08-16 LAB — CMP (CANCER CENTER ONLY)
ALT: 18 U/L (ref 0–44)
AST: 24 U/L (ref 15–41)
Albumin: 4.5 g/dL (ref 3.5–5.0)
Alkaline Phosphatase: 163 U/L — ABNORMAL HIGH (ref 38–126)
Anion gap: 12 (ref 5–15)
BUN: 9 mg/dL (ref 6–20)
CO2: 25 mmol/L (ref 22–32)
Calcium: 10 mg/dL (ref 8.9–10.3)
Chloride: 102 mmol/L (ref 98–111)
Creatinine: 0.78 mg/dL (ref 0.44–1.00)
GFR, Estimated: >60 mL/min (ref >60)
Glucose, Bld: 107 mg/dL — ABNORMAL HIGH (ref 70–99)
Potassium: 3.7 mmol/L (ref 3.5–5.1)
Sodium: 139 mmol/L (ref 135–145)
Total Bilirubin: 0.6 mg/dL (ref 0.3–1.2)
Total Protein: 8.6 g/dL — ABNORMAL HIGH (ref 6.5–8.1)

## 2020-08-16 NOTE — Assessment & Plan Note (Signed)
Bilateral breast cancers 01/12/2017:Left mastectomy: IDC with DCIS, 2 tumors 3.6 cm ER 90%, PR 95%, Ki-67 5%, HER-2 negative ratio 1.32) and 1.2 cm (ER 95% PR 95% positive Her 2 Positive ratio 3.2, Ki-67 5%) T2 N0 stage Ib Right mastectomy: 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 Adjuvant chemotherapy with TCH x 4 cycles completed on 06/09/2017 (Taxotere held from final 2 cycles)followed by Herceptin maintenance for 1 year 2. Adjuvant radiation therapystarted 08/10/2017-09/22/2017 3. Followed by adjuvant antiestrogen therapy letrozole started 09/24/2017 switched to anastrozole January 2020 __________________________________________________________________________________________ Concern for metastatic breast cancer:  Plan: 1.  PET CT scan 2. obtain tissue biopsy to understand the breast prognostic markers.  Return to clinic to initiate systemic therapy accordingly.

## 2020-08-16 NOTE — Progress Notes (Signed)
 Patient Care Team: Panosh, Wanda K, MD as PCP - General Rivard, Sandra, MD (Obstetrics and Gynecology) Hoxworth, Benjamin, MD (Inactive) as Consulting Physician (General Surgery) , , MD as Consulting Physician (Hematology and Oncology) Moody, John, MD as Consulting Physician (Radiation Oncology) Causey, Lindsey Cornetto, NP as Nurse Practitioner (Hematology and Oncology)  DIAGNOSIS:  Encounter Diagnoses  Name Primary?  . Malignant neoplasm of overlapping sites of right breast in female, estrogen receptor positive (HCC) Yes  . Malignant neoplasm of overlapping sites of left breast in female, estrogen receptor positive (HCC)   . Lytic bone lesions on xray     SUMMARY OF ONCOLOGIC HISTORY: Oncology History  Malignant neoplasm of overlapping 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 breast (HCC)  01/12/2017 Surgery   Left mastectomy: IDC with DCIS, 2 tumors 3.6 cm ER 90%, PR 95%, Ki-67 5%, HER-2 negative ratio 1.32) and 1.2 cm (ER 95% PR 95% positive Her 2 Positive ratio 3.2, Ki-67 5%) T2 N0 stage Ib   02/16/2017 -  Chemotherapy   TCH x6 followed by Herceptin maintenance    Malignant neoplasm of overlapping sites of right breast in female, estrogen receptor positive (HCC)  12/21/2016 Initial Diagnosis   Palpable lumps in the right breast with nipple inversion: Mammogram revealed skin thickening and distortion, ultrasound revealed 4 masses 3.1 cm at 11:00, 2.5 cm at 6:00, 4 cm at 8:00, 2.2 cm at 5:00 with 2 abnormal axillary lymph nodes: MRI revealed 7 cm abnormality on right breast, in addition 3.6 cm abnormality in the left breast and 2 additional masses 1 cm each; right breast: T4 N1 stage III B clinical stage; left breast: T2 N0 stage IB clinical stage   12/21/2016 Pathology Results   Right breast: Grade 1 Invasive lobular cancer, lymph node also positive, ER 70%, PR 90%, Ki-67 10%, HER-2 negative ratio 1.09 Left breast: Grade 1 IDC with DCIS prognostic panel pending   01/04/2017 Genetic  Testing   The patient had genetic testing due to a personal history of bilateral breast caner and a family history of breast cancer.  The Multi-Cancer Panel was ordered. The Multi-Cancer Panel offered by [facility] includes sequencing and/or deletion duplication testing of the following 83 genes: ALK, APC, ATM, AXIN2,BAP1,  BARD1, BLM, BMPR1A, BRCA1, BRCA2, BRIP1, CASR, CDC73, CDH1, CDK4, CDKN1B, CDKN1C, CDKN2A (p14ARF), CDKN2A (p16INK4a), CEBPA, CHEK2, CTNNA1, DICER1, DIS3L2, EGFR (c.2369C>T, p.Thr790Met variant only), EPCAM (Deletion/duplication testing only), FH, FLCN, GATA2, GPC3, GREM1 (Promoter region deletion/duplication testing only), HOXB13 (c.251G>A, p.Gly84Glu), HRAS, KIT, MAX, MEN1, MET, MITF (c.952G>A, p.Glu318Lys variant only), MLH1, MSH2, MSH3, MSH6, MUTYH, NBN, NF1, NF2, NTHL1, PALB2, PDGFRA, PHOX2B, PMS2, POLD1, POLE, POT1, PRKAR1A, PTCH1, PTEN, RAD50, RAD51C, RAD51D, RB1, RECQL4, RET, RUNX1, SDHAF2, SDHA (sequence changes only), SDHB, SDHC, SDHD, SMAD4, SMARCA4, SMARCB1, SMARCE1, STK11, SUFU, TERC, TERT, TMEM127, TP53, TSC1, TSC2, VHL, WRN and WT1.   Results: Negative, no pathogenic variants identified.  The date of this test report is 01/04/2017.     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 the dermis of the skin of the nipple, margins negative, 3/3 lymph nodes positive, grade 1, EF 75%, PR 95%, HER-2 negative ratio 1.19, Ki-67 10%, T3N1A stage IIa   02/16/2017 - 06/09/2017 Chemotherapy   TCH x6 cycles followed by Herceptin maintenance   08/10/2017 - 09/22/2017 Radiation Therapy   Adjuvant radiation   09/24/2017 -  Anti-estrogen oral therapy   Letrozole 2.5 mg daily, changed to Anastrozole over 02/2018 due to arthralgias     CHIEF   COMPLIANT: Concern for metastatic breast cancer  INTERVAL HISTORY: Amanda Williams is a 55-year-old with above-mentioned history of bilateral breast cancers who underwent bilateral mastectomies in 2018 followed by  adjuvant chemotherapy with TCH and Herceptin maintenance followed by radiation and antiestrogen therapy currently on anastrozole.  On the left side she had 2 tumors 1 of whom was HER2 positive.  All the tumors were ER/PR positive.  On the right side she had a HER2 negative tumor however it involved 3 positive lymph nodes in the axilla.    ALLERGIES:  is allergic to penicillins, sulfa antibiotics, sulfonamide derivatives, other, and adhesive [tape].  MEDICATIONS:  Current Outpatient Medications  Medication Sig Dispense Refill  . acetaminophen (TYLENOL) 500 MG tablet Take 1,000 mg by mouth as needed for moderate pain (for pain/headaches.).     . anastrozole (ARIMIDEX) 1 MG tablet Take 1 tablet (1 mg total) by mouth daily. 90 tablet 3  . calcium carbonate (CALTRATE 600) 1500 (600 Ca) MG TABS tablet Take 1 tablet (1,500 mg total) by mouth daily with breakfast.  0  . ibuprofen (ADVIL,MOTRIN) 200 MG tablet Take 400 mg by mouth as needed for moderate pain (for pain/headaches).     . MULTIPLE VITAMIN PO Take 1 tablet by mouth daily.    . sertraline (ZOLOFT) 50 MG tablet TAKE 1/2 TABLET BY MOUTH DAILY THEN INCREASE TO 1 TABLET BY MOUTH DAIYL AS DIRECTED 90 tablet 1   No current facility-administered medications for this visit.    PHYSICAL EXAMINATION: ECOG PERFORMANCE STATUS: 1 - Symptomatic but completely ambulatory  Vitals:   08/16/20 0831  BP: 106/84  Pulse: 93  Resp: 18  Temp: 97.7 F (36.5 C)  SpO2: 98%   Filed Weights   08/16/20 0831  Weight: 150 lb (68 kg)      LABORATORY DATA:  I have reviewed the data as listed CMP Latest Ref Rng & Units 02/02/2018 01/26/2018 12/15/2017  Glucose 70 - 99 mg/dL 96 108(H) 99  BUN 6 - 20 mg/dL 15 15 16  Creatinine 0.44 - 1.00 mg/dL 0.95 0.89 0.81  Sodium 135 - 145 mmol/L 142 140 141  Potassium 3.5 - 5.1 mmol/L 4.2 4.1 4.1  Chloride 98 - 111 mmol/L 103 105 104  CO2 22 - 32 mmol/L 29 26 27  Calcium 8.9 - 10.3 mg/dL 9.6 9.3 9.5  Total Protein  6.5 - 8.1 g/dL 7.6 7.1 7.4  Total Bilirubin 0.3 - 1.2 mg/dL 0.3 0.4 0.5  Alkaline Phos 38 - 126 U/L 113 103 116  AST 15 - 41 U/L 17 14(L) 36  ALT 0 - 44 U/L 15 13 37    Lab Results  Component Value Date   WBC 5.1 02/02/2018   HGB 11.7 (L) 02/02/2018   HCT 35.5 (L) 02/02/2018   MCV 90.6 02/02/2018   PLT 245 02/02/2018   NEUTROABS 3.2 02/02/2018    ASSESSMENT & PLAN:  Malignant neoplasm of overlapping sites of right breast in female, estrogen receptor positive (HCC) Bilateral breast cancers 01/12/2017:Left mastectomy: IDC with DCIS, 2 tumors 3.6 cm ER 90%, PR 95%, Ki-67 5%, HER-2 negative ratio 1.32) and 1.2 cm (ER 95% PR 95% positive Her 2 Positive ratio 3.2, Ki-67 5%) T2 N0 stage Ib Right mastectomy: 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 Adjuvant chemotherapy with TCH x   4 cycles completed on 06/09/2017 (Taxotere held from final 2 cycles)followed by Herceptin maintenance for 1 year 2. Adjuvant radiation therapystarted 08/10/2017-09/22/2017 3. Followed by adjuvant antiestrogen therapy letrozole started 09/24/2017 switched to anastrozole January 2020 __________________________________________________________________________________________ Concern for metastatic breast cancer: [place] at the beach and was walking her dog when she fell and hit her head.  She was taken to the emergency room where she had extensive testing done. CT head without contrast at [facility] revealed possible lytic lesions CT chest with contrast at [facility]: Subcarinal lymph node and anterior mediastinal mass  Differential diagnosis: 1.  Lymphadenopathy could be related to recent COVID infection 2. lytic lesions: We will obtain labs to rule out multiple myeloma and tumor markers for breast. We will also obtain PET CT scan  and consider biopsy if necessary.  Chest pressure: I will request Dr. Bensimhon for consultation.  Plan: 1.  PET CT scan 2. obtain tissue biopsy to understand the breast prognostic markers.  She consented to blood draw study for metastatic breast cancer. Return to clinic after the PET scan to discuss results.    Orders Placed This Encounter  Procedures  . NM PET Image Initial (PI) Skull Base To Thigh    Standing Status:   Future    Standing Expiration Date:   08/16/2021    Order Specific Question:   If indicated for the ordered procedure, I authorize the administration of a radiopharmaceutical per Radiology protocol    Answer:   Yes    Order Specific Question:   Is the patient pregnant?    Answer:   No    Order Specific Question:   Preferred imaging location?    Answer:   [facility]    Order Specific Question:   Release to patient    Answer:   Immediate  . CBC with Differential (Cancer Center Only)    Standing Status:   Future    Number of Occurrences:   1    Standing Expiration Date:   08/16/2021  . Beta 2 microglobulin, serum    Standing Status:   Future    Number of Occurrences:   1    Standing Expiration Date:   08/16/2021   The patient has a good understanding of the overall plan. she agrees with it. she will call with any problems that may develop before the next visit here. Total time spent: 30 mins including face to face time and time spent for planning, charting and co-ordination of care   Viinay K , MD 08/16/20    

## 2020-08-16 NOTE — Telephone Encounter (Signed)
RN notified patient of imaging appointment.   RN left voicemail with [facility] to see if we can accommodate appointment for 6/1.

## 2020-08-17 LAB — BETA 2 MICROGLOBULIN, SERUM: Beta-2 Microglobulin: 1.7 mg/L (ref 0.6–2.4)

## 2020-08-17 LAB — CANCER ANTIGEN 27.29: CA 27.29: 70.5 U/mL — ABNORMAL HIGH (ref 0.0–38.6)

## 2020-08-19 ENCOUNTER — Other Ambulatory Visit (HOSPITAL_COMMUNITY): Payer: Self-pay | Admitting: Hematology and Oncology

## 2020-08-19 DIAGNOSIS — C801 Malignant (primary) neoplasm, unspecified: Secondary | ICD-10-CM

## 2020-08-20 ENCOUNTER — Inpatient Hospital Stay
Admission: RE | Admit: 2020-08-20 | Discharge: 2020-08-20 | Disposition: A | Discharge status: Home / Self Care | Payer: Self-pay | Source: Ambulatory Visit | Attending: Hematology and Oncology | Admitting: Hematology and Oncology

## 2020-08-20 ENCOUNTER — Telehealth: Payer: Self-pay

## 2020-08-20 ENCOUNTER — Other Ambulatory Visit: Payer: Self-pay

## 2020-08-20 ENCOUNTER — Other Ambulatory Visit (HOSPITAL_COMMUNITY): Payer: Self-pay | Admitting: Hematology and Oncology

## 2020-08-20 ENCOUNTER — Encounter: Payer: Self-pay | Admitting: Hematology and Oncology

## 2020-08-20 ENCOUNTER — Ambulatory Visit
Admission: RE | Admit: 2020-08-20 | Discharge: 2020-08-20 | Disposition: A | Discharge status: Home / Self Care | Payer: Self-pay | Source: Ambulatory Visit | Attending: Hematology and Oncology | Admitting: Hematology and Oncology

## 2020-08-20 DIAGNOSIS — C801 Malignant (primary) neoplasm, unspecified: Secondary | ICD-10-CM

## 2020-08-20 LAB — KAPPA/LAMBDA LIGHT CHAINS
Kappa free light chain: 17.6 mg/L (ref 3.3–19.4)
Kappa, lambda light chain ratio: 1.48 (ref 0.26–1.65)
Lambda free light chains: 11.9 mg/L (ref 5.7–26.3)

## 2020-08-20 NOTE — Progress Notes (Signed)
error 

## 2020-08-20 NOTE — Telephone Encounter (Signed)
Lovena Le from [facility] called stating pt can be seen for PET 08/22/20 at 0800. This LPN attempted to call pt to confirm this would be OK. Pt did not answer. LVM for pt to return call to let me know if this date/time is OK before it can be scheduled.

## 2020-08-22 ENCOUNTER — Ambulatory Visit (HOSPITAL_COMMUNITY)
Admission: RE | Admit: 2020-08-22 | Discharge: 2020-08-22 | Disposition: A | Discharge status: Home / Self Care | Payer: BC Managed Care – PPO | Source: Ambulatory Visit | Attending: Hematology and Oncology | Admitting: Hematology and Oncology

## 2020-08-22 ENCOUNTER — Other Ambulatory Visit: Payer: Self-pay

## 2020-08-22 DIAGNOSIS — M899 Disorder of bone, unspecified: Secondary | ICD-10-CM | POA: Insufficient documentation

## 2020-08-22 DIAGNOSIS — Z170 Estrogen receptor positive status [ER+]: Secondary | ICD-10-CM | POA: Insufficient documentation

## 2020-08-22 DIAGNOSIS — C50812 Malignant neoplasm of overlapping 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 breast: Secondary | ICD-10-CM | POA: Insufficient documentation

## 2020-08-22 DIAGNOSIS — C50919 Malignant neoplasm of unspecified site of unspecified female breast: Secondary | ICD-10-CM | POA: Diagnosis not present

## 2020-08-22 DIAGNOSIS — C50811 Malignant neoplasm of overlapping sites of right female breast: Secondary | ICD-10-CM | POA: Diagnosis not present

## 2020-08-22 LAB — GLUCOSE, CAPILLARY: Glucose-Capillary: 108 mg/dL — ABNORMAL HIGH (ref 70–99)

## 2020-08-22 MED ORDER — FLUDEOXYGLUCOSE F - 18 (FDG) INJECTION
7.5000 | Freq: Once | INTRAVENOUS | Status: AC
  Administered 2020-08-22: 7.47 via INTRAVENOUS

## 2020-08-22 MED ORDER — SERTRALINE HCL 100 MG PO TABS: 100.0000 mg | ORAL_TABLET | Freq: Every day | ORAL | 0 refills | Status: DC

## 2020-08-22 NOTE — Telephone Encounter (Signed)
Sorry that you are having so many negative pressures.  Please make her a visit with a provider to have stitches removed. In the meantime we can go back up to 100 mg/day of sertraline. Please make sure she has enough medication.  If she wants to double up on her 50 mg  Otherwise can send  rx in sertraline 100 mg 1 p.o. daily dispense 90 .   Have her make follow-up appointment with me for medication check appointment for 3 to 4 weeks after medication dose change.

## 2020-08-22 NOTE — Progress Notes (Signed)
HEMATOLOGY-ONCOLOGY MYCHART VIDEO VISIT PROGRESS NOTE  I connected with [name] on 08/23/2020 at  8:00 AM EDT by MyChart video conference and verified that I am speaking with the correct person using two identifiers.  I discussed the limitations, risks, security and privacy concerns of performing an evaluation and management service by MyChart and the availability of in person appointments.  I also discussed with the patient that there may be a patient responsible charge related to this service. The patient expressed understanding and agreed to proceed.  Patient's Location: Home Physician Location: Clinic  CHIEF COMPLIANT: Follow-up of potential metastatic disease, review PET scan   INTERVAL HISTORY: Amanda Williams is a 55 y.o. female with above-mentioned history of bilateral breast cancers who underwent bilateral mastectomies in 2018 followed by adjuvant chemotherapy with TCH, Herceptin maintenance, radiation, and is currently on antiestrogen therapy with anastrozole. She presents over MyChart today to review her PET scan.  Her major complaints are related to chest pressure.  She does not feel too short of breath.  Denies any cough or expectoration.  Oncology History  Malignant neoplasm of overlapping 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 breast ([place])  01/12/2017 Surgery   Left mastectomy: IDC with DCIS, 2 tumors 3.6 cm ER 90%, PR 95%, Ki-67 5%, HER-2 negative ratio 1.32) and 1.2 cm (ER 95% PR 95% positive Her 2 Positive ratio 3.2, Ki-67 5%) T2 N0 stage Ib   02/16/2017 -  Chemotherapy   TCH x6 followed by Herceptin maintenance    Malignant neoplasm of overlapping sites of right breast in female, estrogen receptor positive ([place])  12/21/2016 Initial Diagnosis   Palpable lumps in the right breast with nipple inversion: Mammogram revealed skin thickening and distortion, ultrasound revealed 4 masses 3.1 cm at 11:00, 2.5 cm at 6:00, 4 cm at 8:00, 2.2 cm at 5:00 with 2 abnormal axillary lymph nodes: MRI  revealed 7 cm abnormality on right breast, in addition 3.6 cm abnormality in the left breast and 2 additional masses 1 cm each; right breast: T4 N1 stage III B clinical stage; left breast: T2 N0 stage IB clinical stage   12/21/2016 Pathology Results   Right breast: Grade 1 Invasive lobular cancer, lymph node also positive, ER 70%, PR 90%, Ki-67 10%, HER-2 negative ratio 1.09 Left breast: Grade 1 IDC with DCIS prognostic panel pending   01/04/2017 Genetic Testing   The patient had genetic testing due to a personal history of bilateral breast caner and a family history of breast cancer.  The Multi-Cancer Panel was ordered. The Multi-Cancer Panel offered by [facility] includes sequencing and/or deletion duplication testing of the following 83 genes: ALK, APC, ATM, AXIN2,BAP1,  BARD1, BLM, BMPR1A, BRCA1, BRCA2, BRIP1, CASR, CDC73, CDH1, CDK4, CDKN1B, CDKN1C, CDKN2A (p14ARF), CDKN2A (p16INK4a), CEBPA, CHEK2, CTNNA1, DICER1, DIS3L2, EGFR (c.2369C>T, p.Thr790Met variant only), EPCAM (Deletion/duplication testing only), FH, FLCN, GATA2, GPC3, GREM1 (Promoter region deletion/duplication testing only), HOXB13 (c.251G>A, p.Gly84Glu), HRAS, KIT, MAX, MEN1, MET, MITF (c.952G>A, p.Glu318Lys variant only), MLH1, MSH2, MSH3, MSH6, MUTYH, NBN, NF1, NF2, NTHL1, PALB2, PDGFRA, PHOX2B, PMS2, POLD1, POLE, POT1, PRKAR1A, PTCH1, PTEN, RAD50, RAD51C, RAD51D, RB1, RECQL4, RET, RUNX1, SDHAF2, SDHA (sequence changes only), SDHB, SDHC, SDHD, SMAD4, SMARCA4, SMARCB1, SMARCE1, STK11, SUFU, TERC, TERT, TMEM127, TP53, TSC1, TSC2, VHL, WRN and WT1.   Results: Negative, no pathogenic variants identified.  The date of this test report is 01/04/2017.     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 the dermis of the skin of the nipple,  margins negative, 3/3 lymph nodes positive, grade 1, EF 75%, PR 95%, HER-2 negative ratio 1.19, Ki-67 10%, T3N1A stage IIa   02/16/2017 - 06/09/2017 Chemotherapy   TCH  x6 cycles followed by Herceptin maintenance   08/10/2017 - 09/22/2017 Radiation Therapy   Adjuvant radiation   09/24/2017 -  Anti-estrogen oral therapy   Letrozole 2.5 mg daily, changed to Anastrozole over 02/2018 due to arthralgias     Observations/Objective:  There were no vitals filed for this visit. There is no height or weight on file to calculate BMI.  I have reviewed the data as listed CMP Latest Ref Rng & Units 08/16/2020 02/02/2018 01/26/2018  Glucose 70 - 99 mg/dL 107(H) 96 108(H)  BUN 6 - 20 mg/dL _0 Creatinine 0.44 - 1.00 mg/dL 0.78 0.95 0.89  Sodium 135 - 145 mmol/L 139 142 140  Potassium 3.5 - 5.1 mmol/L 3.7 4.2 4.1  Chloride 98 - 111 mmol/L 102 103 105  CO2 22 - 32 mmol/L _1 Calcium 8.9 - 10.3 mg/dL 10.0 9.6 9.3  Total Protein 6.5 - 8.1 g/dL 8.6(H) 7.6 7.1  Total Bilirubin 0.3 - 1.2 mg/dL 0.6 0.3 0.4  Alkaline Phos 38 - 126 U/L 163(H) 113 103  AST 15 - 41 U/L 24 17 14(L)  ALT 0 - 44 U/L _2 Lab Results  Component Value Date   WBC 7.0 08/16/2020   HGB 13.0 08/16/2020   HCT 39.2 08/16/2020   MCV 89.9 08/16/2020   PLT 296 08/16/2020   NEUTROABS 5.0 08/16/2020      Assessment Plan:  Malignant neoplasm of overlapping sites of right breast in female, estrogen receptor positive ([place]) Bilateral breast cancers 01/12/2017:Left mastectomy: IDC with DCIS, 2 tumors 3.6 cm ER 90%, PR 95%, Ki-67 5%, HER-2 negative ratio 1.32) and 1.2 cm (ER 95% PR 95% positive Her 2 Positive ratio 3.2, Ki-67 5%) T2 N0 stage Ib Right mastectomy: 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 Adjuvant chemotherapy with TCH x 4 cycles completed on 06/09/2017 (Taxotere held from final 2 cycles)followed by Herceptin maintenance for 1 year 2. Adjuvant radiation therapystarted  08/10/2017-09/22/2017 3. Followed by adjuvant antiestrogen therapy letrozole started 09/24/2017 switched to anastrozole January 2020 __________________________________________________________________________________________ Concern for metastatic breast cancer: [place] the beach and [place] walking her dog when she fell and hit her head.  She was taken to the emergency room where she had extensive testing done. CT head without contrast at [facility] revealed possible lytic lesions CT chest with contrast at [facility]: Subcarinal lymph node and anterior mediastinal mass PET CT scan 08/23/2020: Widespread FDG avid nodal and bone disease suspicious for metastases.  The possibility of lymphoproliferative disorder is considered given the confluence masslike appearance of anterior mediastinal soft tissue.  Treatment plan: Request interventional radiology  for biopsy of the tumor Ultrasound-guided cervical lymph node biopsy will be requested.  I discussed with Dr. Pascal Lux.    I discussed the assessment and treatment plan with the patient. The patient was provided an opportunity to ask questions and all were answered. The patient agreed with the plan and demonstrated an understanding of the instructions. The patient was advised to call back or seek an in-person evaluation if the symptoms worsen or if the condition fails to improve as anticipated.  Total time spent: 30 minutes including face-to-face MyChart video visit time and time spent for planning, charting and coordination of care  Rulon Eisenmenger, MD 08/23/2020   I, Cloyde Reams Dorshimer, am acting as scribe for Nicholas Lose, MD.  I have reviewed the above documentation for accuracy and completeness, and I agree with the above.

## 2020-08-22 NOTE — Telephone Encounter (Signed)
I spoke with the patient and scheduled a visit with Dorothyann Peng NP on 08/27/2020 for stiches removal. Sertraline 100mg  was sent to patients pharmacy. Medication check appt made for 09/10/2020 with PCP.

## 2020-08-23 ENCOUNTER — Inpatient Hospital Stay: Payer: BC Managed Care – PPO | Attending: Hematology and Oncology | Admitting: Hematology and Oncology

## 2020-08-23 DIAGNOSIS — C50811 Malignant neoplasm of overlapping sites of right female breast: Secondary | ICD-10-CM

## 2020-08-23 DIAGNOSIS — Z170 Estrogen receptor positive status [ER+]: Secondary | ICD-10-CM | POA: Diagnosis not present

## 2020-08-23 DIAGNOSIS — C799 Secondary malignant neoplasm of unspecified site: Secondary | ICD-10-CM

## 2020-08-23 DIAGNOSIS — Z9221 Personal history of antineoplastic chemotherapy: Secondary | ICD-10-CM | POA: Insufficient documentation

## 2020-08-23 DIAGNOSIS — Z9013 Acquired absence of bilateral breasts and nipples: Secondary | ICD-10-CM | POA: Insufficient documentation

## 2020-08-23 DIAGNOSIS — C50812 Malignant neoplasm of overlapping 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 breast: Secondary | ICD-10-CM | POA: Diagnosis not present

## 2020-08-23 DIAGNOSIS — Z923 Personal history of irradiation: Secondary | ICD-10-CM | POA: Insufficient documentation

## 2020-08-23 DIAGNOSIS — Z79811 Long term (current) use of aromatase inhibitors: Secondary | ICD-10-CM | POA: Insufficient documentation

## 2020-08-23 DIAGNOSIS — Z5112 Encounter for antineoplastic immunotherapy: Secondary | ICD-10-CM | POA: Insufficient documentation

## 2020-08-23 NOTE — Assessment & Plan Note (Signed)
Bilateral breast cancers 01/12/2017:Left mastectomy: IDC with DCIS, 2 tumors 3.6 cm ER 90%, PR 95%, Ki-67 5%, HER-2 negative ratio 1.32) and 1.2 cm (ER 95% PR 95% positive Her 2 Positive ratio 3.2, Ki-67 5%) T2 N0 stage Ib Right mastectomy: 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 Adjuvant chemotherapy with TCH x 4 cycles completed on 06/09/2017 (Taxotere held from final 2 cycles)followed by Herceptin maintenance for 1 year 2. Adjuvant radiation therapystarted 08/10/2017-09/22/2017 3. Followed by adjuvant antiestrogen therapy letrozole started 09/24/2017 switched to anastrozole January 2020 __________________________________________________________________________________________ Concern for metastatic breast cancer: Patient was at the beach and was walking her dog when she fell and hit her head.  She was taken to the emergency room where she had extensive testing done. CT head without contrast at [facility] revealed possible lytic lesions CT chest with [facility] at [facility]: Subcarinal lymph node and anterior mediastinal mass PET CT scan 08/23/2020: Widespread FDG avid nodal and bone disease suspicious for metastases.  The possibility of lymphoproliferative disorder is considered given the confluence masslike appearance of anterior mediastinal soft tissue.  Treatment plan: Request interventional radiology for biopsy of the anterior mediastinal tumor.

## 2020-08-27 ENCOUNTER — Ambulatory Visit: Payer: BC Managed Care – PPO | Admitting: Adult Health

## 2020-08-27 ENCOUNTER — Encounter (HOSPITAL_COMMUNITY): Payer: Self-pay

## 2020-08-27 NOTE — Progress Notes (Signed)
  Amanda Williams Female, 55 y.o., [birth year]  MRN:  [id number] Phone:  [phone] Jerilynn Mages)       PCP:  Burnis Medin, MD Coverage:  Sherre Poot [employer] Ppo  Next Appt With Radiology ([facility] 2) 09/02/2020 at 1:00 PM           RE: Biopsy Received: Today Arne Cleveland, MD  Nicholas Lose, MD Cc: Ernestene Mention will do.   '@Arvin Abello'$ : please schedule pt for  [place] FNA supraclav LAN (prob R but dealers choice; attempt core if safe/possible)  Met v lymphoma   DDH        Previous Messages   ----- Message -----  From: Nicholas Lose, MD  Sent: 08/26/2020  4:24 PM EDT  To: Arne Cleveland, MD, Lenore Cordia  Subject: RE: Biopsy                    Please proceed with FNA. Shes anxious and has been calling daily  If non diagnostic, we can do mediastinoscopy  Vinay  ----- Message -----  From: Arne Cleveland, MD  Sent: 08/26/2020  3:31 PM EDT  To: Pincus Sanes, MD  Subject: RE: Biopsy                    THe anterior mediastinal nodes are prob best for biopsy but would require mediastinoscopy. THe more peripheral adenopathy  prob too small for flow eval etc, we could FNA to r/o metastatic disease only.  Let me know how you'd like to proceed.  Thanks  Arne Cleveland     ----- Message -----  From: Lenore Cordia  Sent: 08/26/2020  2:38 PM EDT  To: [facility] Procedure Requests  Subject: Biopsy                      Procedure Requested:  US Biopsy (lymph nodes)    Reason for Procedure: Suspicious for metastatic breast cancer versus lymphoma (send for flow cytometry as well)    Provider Requesting: Dr Lindi Adie  Provider Telephone: [phone]    Other Info:

## 2020-08-28 ENCOUNTER — Telehealth: Payer: Self-pay | Admitting: Hematology and Oncology

## 2020-08-28 ENCOUNTER — Encounter (HOSPITAL_COMMUNITY): Payer: BC Managed Care – PPO

## 2020-08-28 NOTE — Telephone Encounter (Signed)
Scheduled appointment per 06/07 sch msg. Patient is aware. 

## 2020-08-29 ENCOUNTER — Inpatient Hospital Stay: Payer: BC Managed Care – PPO | Admitting: Hematology and Oncology

## 2020-08-30 ENCOUNTER — Other Ambulatory Visit: Payer: Self-pay | Admitting: Student

## 2020-09-02 ENCOUNTER — Ambulatory Visit (HOSPITAL_COMMUNITY)
Admission: RE | Admit: 2020-09-02 | Discharge: 2020-09-02 | Disposition: A | Discharge status: Home / Self Care | Payer: BC Managed Care – PPO | Source: Ambulatory Visit | Attending: Hematology and Oncology | Admitting: Hematology and Oncology

## 2020-09-02 ENCOUNTER — Other Ambulatory Visit: Payer: Self-pay

## 2020-09-02 ENCOUNTER — Encounter (HOSPITAL_COMMUNITY): Payer: Self-pay

## 2020-09-02 DIAGNOSIS — C799 Secondary malignant neoplasm of unspecified site: Secondary | ICD-10-CM

## 2020-09-02 DIAGNOSIS — Z9013 Acquired absence of bilateral breasts and nipples: Secondary | ICD-10-CM | POA: Diagnosis not present

## 2020-09-02 DIAGNOSIS — Z79811 Long term (current) use of aromatase inhibitors: Secondary | ICD-10-CM | POA: Diagnosis not present

## 2020-09-02 DIAGNOSIS — C770 Secondary and unspecified malignant neoplasm of lymph nodes of head, face and neck: Secondary | ICD-10-CM | POA: Insufficient documentation

## 2020-09-02 DIAGNOSIS — R590 Localized enlarged lymph nodes: Secondary | ICD-10-CM | POA: Diagnosis not present

## 2020-09-02 DIAGNOSIS — Z79899 Other long term (current) drug therapy: Secondary | ICD-10-CM | POA: Diagnosis not present

## 2020-09-02 DIAGNOSIS — Z803 Family history of malignant neoplasm of breast: Secondary | ICD-10-CM | POA: Insufficient documentation

## 2020-09-02 DIAGNOSIS — R937 Abnormal findings on diagnostic imaging of other parts of musculoskeletal system: Secondary | ICD-10-CM | POA: Insufficient documentation

## 2020-09-02 DIAGNOSIS — C801 Malignant (primary) neoplasm, unspecified: Secondary | ICD-10-CM | POA: Diagnosis not present

## 2020-09-02 DIAGNOSIS — Z87442 Personal history of urinary calculi: Secondary | ICD-10-CM | POA: Insufficient documentation

## 2020-09-02 DIAGNOSIS — Z853 Personal history of malignant neoplasm of breast: Secondary | ICD-10-CM | POA: Diagnosis not present

## 2020-09-02 DIAGNOSIS — Z170 Estrogen receptor positive status [ER+]: Secondary | ICD-10-CM

## 2020-09-02 DIAGNOSIS — C50811 Malignant neoplasm of overlapping sites of right female breast: Secondary | ICD-10-CM

## 2020-09-02 MED ORDER — FENTANYL CITRATE (PF) 100 MCG/2ML IJ SOLN
INTRAMUSCULAR | Status: AC
  Filled 2020-09-02: qty 2

## 2020-09-02 MED ORDER — FENTANYL CITRATE (PF) 100 MCG/2ML IJ SOLN
INTRAMUSCULAR | Status: AC | PRN
  Administered 2020-09-02: 50 ug via INTRAVENOUS

## 2020-09-02 MED ORDER — MIDAZOLAM HCL 2 MG/2ML IJ SOLN
INTRAMUSCULAR | Status: AC
  Filled 2020-09-02: qty 2

## 2020-09-02 MED ORDER — MIDAZOLAM HCL 2 MG/2ML IJ SOLN
INTRAMUSCULAR | Status: AC | PRN
  Administered 2020-09-02: 1 mg via INTRAVENOUS

## 2020-09-02 MED ORDER — LIDOCAINE HCL 1 % IJ SOLN
INTRAMUSCULAR | Status: AC
  Filled 2020-09-02: qty 20

## 2020-09-02 MED ORDER — SODIUM CHLORIDE 0.9 % IV SOLN: INTRAVENOUS | Status: DC

## 2020-09-02 MED ORDER — LIDOCAINE HCL 1 % IJ SOLN
INTRAMUSCULAR | Status: AC | PRN
Start: 2020-09-02 — End: 2020-09-02
  Administered 2020-09-02: 10 mL via INTRADERMAL

## 2020-09-02 NOTE — Progress Notes (Signed)
Informed pt and her husband CT biopsy is running 1.5 hours behind. They verbalize understanding.

## 2020-09-02 NOTE — Consult Note (Signed)
Chief Complaint: Patient was seen in consultation today for image guided supraclavicular lymph node biopsy  Referring Physician(s): Nicholas Lose  Supervising Physician: Mir, Sharen Heck  Patient Status: [facility] - Out-pt  History of Present Illness: Amanda Williams is a 55 y.o. female with PMH nephrolithiasis and bilateral breast cancers in 2018, s/p bilateral mastectomies and chemoradiation. Recent PET scan revealed:  Widespread FDG avid nodal and bony disease suspicious for metastasis. The possibility of lymphoproliferative disorder is considered given the more confluent masslike appearance of anterior mediastinal soft tissue. Biopsy may be helpful. Thoracic inlet lymph nodes may be readily accessible for tissue sampling as warranted.   Consolidative changes and parenchymal distortion in the anterior RIGHT chest related to prior radiotherapy.   Postoperative changes of bilateral mastectomy and axillary Dissection  She presents today for image guided supraclavicular lymph node biopsy for further evaluation.  Past Medical History:  Diagnosis Date   Allergy    Breast cancer ([place])    Difficult intubation    see 01/12/17 anesthesia record   Family history of breast cancer    Hematuria    ? trigonitis   History of kidney stones    Panic attack    echo 2009 normal    Past Surgical History:  Procedure Laterality Date   BREAST BIOPSY     left   BREAST RECONSTRUCTION WITH PLACEMENT OF TISSUE EXPANDER AND FLEX HD (ACELLULAR HYDRATED DERMIS) Bilateral 01/12/2017   Procedure: BREAST RECONSTRUCTION WITH PLACEMENT OF TISSUE EXPANDER AND ALLODERM;  Surgeon: Irene Limbo, MD;  Location: [place];  Service: Plastics;  Laterality: Bilateral;   COLONOSCOPY     LIPOSUCTION WITH LIPOFILLING Bilateral 11/02/2017   Procedure: lipofilling from abdomen to bilateral chest;  Surgeon: Irene Limbo, MD;  Location: [place];  Service: Plastics;  Laterality:  Bilateral;   MASTECTOMY W/ SENTINEL NODE BIOPSY Bilateral 01/12/2017   Procedure: RIGHT MODIFIED RADICAL MASTECTOMY, LEFT TOTAL MASTECTOMY WITH LEFT SENTINEL LYMPH NODE BIOPSY;  Surgeon: Rolm Bookbinder, MD;  Location: [place];  Service: General;  Laterality: Bilateral;   PORTACATH PLACEMENT N/A 02/15/2017   Procedure: INSERTION PORT-A-CATH WITH [place];  Surgeon: Rolm Bookbinder, MD;  Location: [place];  Service: General;  Laterality: N/A;   REMOVAL OF TISSUE EXPANDER AND PLACEMENT OF IMPLANT Bilateral 07/16/2017   Procedure: BILATERAL REMOVAL OF TISSUE EXPANDER AND PLACEMENT OF SILICONE IMPLANT;  Surgeon: Irene Limbo, MD;  Location: [place];  Service: Plastics;  Laterality: Bilateral;    Allergies: Penicillins, Sulfa antibiotics, Sulfonamide derivatives, Other, and Adhesive [tape]  Medications: Prior to Admission medications   Medication Sig Start Date End Date Taking? Authorizing Provider  acetaminophen (TYLENOL) 500 MG tablet Take 1,000 mg by mouth as needed for moderate pain (for pain/headaches.).     [provider]  anastrozole (ARIMIDEX) 1 MG tablet Take 1 tablet (1 mg total) by mouth daily. 10/27/19   Nicholas Lose, MD  calcium carbonate (CALTRATE 600) 1500 (600 Ca) MG TABS tablet Take 1 tablet (1,500 mg total) by mouth daily with breakfast. 10/27/18   Nicholas Lose, MD  ibuprofen (ADVIL,MOTRIN) 200 MG tablet Take 400 mg by mouth as needed for moderate pain (for pain/headaches).     [provider]  MULTIPLE VITAMIN PO Take 1 tablet by mouth daily.    [provider]  sertraline (ZOLOFT) 100 MG tablet Take 1 tablet (100 mg total) by mouth daily. 08/22/20   Panosh, Standley Brooking, MD     Family History  Problem Relation Age  of Onset   Breast cancer Maternal Grandmother 87    Social History   Socioeconomic History   Marital status: Married    Spouse name: Not on file   Number of children: Not on file   Years of education: Not on file    Highest education level: Not on file  Occupational History   Not on file  Tobacco Use   Smoking status: Never   Smokeless tobacco: Never  Vaping Use   Vaping Use: Never used  Substance and Sexual Activity   Alcohol use: Yes    Alcohol/week: 4.0 standard drinks    Types: 4 Glasses of wine per week    Comment: ocassionally   Drug use: No   Sexual activity: Not on file    Comment: Husband has had vasectomy  Other Topics Concern   Not on file  Social History Narrative   hhof  4    Pets dog and cat.   Works [employer] [employer] .     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    Has smoke detector and wears seat belts.  No firearms. No excess sun exposure. Sees dentist regularly .          Social Determinants of Health   Financial Resource Strain: Not on file  Food Insecurity: Not on file  Transportation Needs: Not on file  Physical Activity: Not on file  Stress: Not on file  Social Connections: Not on file      Review of Systems denies fever,HA,CP,dyspnea, cough, abd/back pain,N/V or bleeding  Vital Signs: LMP 03/14/2017 Comment: chemo therapy   Physical Exam awake/alert; chest- CTA bilat; heart- RRR; abd- soft,+BS,NT; no LE edema  Imaging: NM PET Image Initial (PI) Skull Base To Thigh  Result Date: 08/22/2020 CLINICAL DATA:  Subsequent treatment strategy for breast cancer, recent fall. History of LEFT mastectomy in 2018 subsequent diagnosis of RIGHT breast cancer and RIGHT mastectomy with systemic therapy and adjuvant radiation with recent concern for metastatic disease. EXAM: NUCLEAR MEDICINE PET SKULL BASE TO THIGH TECHNIQUE: 7.47 mCi F-18 FDG was injected intravenously. Full-ring PET imaging was performed from the skull base to thigh after the radiotracer. CT data was obtained and used for attenuation correction and anatomic localization. Fasting blood glucose: 108 mg/dl COMPARISON:  Outside imaging of the head and C-spine. Prior PET exam from 2018 FINDINGS: Mediastinal  blood pool activity: SUV max 2.58 Liver activity: SUV max NA NECK: RIGHT and LEFT level IV and VB lymph nodes with increased metabolic activity. Also with small lymph nodes tracking along cervical nodal chains greater on the RIGHT than the LEFT with a small RIGHT level II/III lymph node (image 33/4) with maximum SUV of 4.1 measuring 8 mm. Thoracic inlet nodes on the RIGHT at IV (image 54/4) 2 adjacent lymph nodes measuring as much as 1.5 cm with a maximum SUV of approximately 0.6. LEFT Roman numeral level for/thoracic inlet lymph nodes (image 51/4) a cluster of small nodes largest approximately a cm with a maximum SUV of 4.4 Small supraclavicular nodes on the LEFT and high axillary nodes as well less than a cm with mild increased FDG uptake. Incidental CT findings: None CHEST: Bulky area in the anterior mediastinum measuring 1.9 x 4.3 cm with a maximum SUV of 13 (image 74/4) Subcarinal nodal tissue (image 73/4) approximately 1.6 cm short axis with a maximum SUV of 7.4 tracking into the adjacent precarinal and RIGHT paratracheal  chains with increased metabolic activity tracking along the RIGHT paratracheal chain. Signs of increased metabolic activity about the RIGHT hilum Incidental CT findings: Heart great vessels unremarkable on noncontrast CT. Esophagus mildly patulous otherwise normal. Signs of bilateral mastectomy and RIGHT axillary dissection. Subpleural area of consolidative change in the anterior RIGHT chest deep to the chest wall appears very similar to previous imaging displaying mild FDG uptake with a maximum SUV of 4.6. Other areas of subpleural ground-glass and a focal area of nodularity are unchanged since 2019 likely related to prior therapy. Small fissural nodule in the lower LEFT chest (image 30/8) not associated with increased FDG uptake and likely present on previous imaging may represent a small subpleural lymph node. Airways are patent. Mild air trapping at the lung bases versus atelectasis.  ABDOMEN/PELVIS: No abnormal hypermetabolic activity within the liver, pancreas, adrenal glands, or spleen. No hypermetabolic lymph nodes in the abdomen or pelvis. Incidental CT findings: none SKELETON: Sites of multifocal bony metastatic disease. (Image 29/4) lucent lesion in C2 with associated increased metabolic activity measures approximately 4 mm to 5 mm size with a maximum SUV of 6.0. (Image 55/4) lytic lesion in the posterior aspect of the T2 vertebral body measuring approximately 8 mm with a maximum SUV of 8.8. Smaller lesion in the contralateral T2 vertebral body compatible with metastasis as well. Subjacent T3 vertebral body (image 61/4) 9 mm lesion with similar degree of metabolic activity. Scattered additional lesions in the thoracic spine. (Image 98/4) T10 lesion measuring 1.4 cm with a maximum SUV of 8.5. Smaller T12 lesion also with some metabolic activity on image 106 of series 4. (Image 117/4) 15 mm L1 lesion also hypermetabolic with a maximum SUV of 6.7. Numerous additional foci of lytic change, smaller areas in the spine. Posterior element involvement at T11. Bilateral pelvic involvement and involvement of the LEFT proximal femur. RIGHT hemi sacrum, sacral ala with lytic lesion measuring 1.5 cm showing a maximum SUV of 6.6. Smaller bilateral iliac lesions in posterior iliac and along the inferior iliac just above the acetabulum bilaterally. LEFT femoral lesions involving lesser trochanter and proximal LEFT femoral medullary cavity. Incidental CT findings: Fracture of LEFT anterior sixth rib may be posttraumatic or pathologic. IMPRESSION: Widespread FDG avid nodal and bony disease suspicious for metastasis. The possibility of lymphoproliferative disorder is considered given the more confluent masslike appearance of anterior mediastinal soft tissue. Biopsy may be helpful. Thoracic inlet lymph nodes may be readily accessible for tissue sampling as warranted. Consolidative changes and parenchymal  distortion in the anterior RIGHT chest related to prior radiotherapy. Postoperative changes of bilateral mastectomy and axillary dissection. Electronically Signed   By: Zetta Bills M.D.   On: 08/22/2020 12:08   CT OUTSIDE FILMS CHEST  Result Date: 08/20/2020 This examination belongs to an outside facility and is stored here for comparison purposes only.  Contact the originating outside institution for any associated report or interpretation.  CT OUTSIDE FILMS CHEST  Result Date: 08/20/2020 This examination belongs to an outside facility and is stored here for comparison purposes only.  Contact the originating outside institution for any associated report or interpretation.  DG Outside Films Chest  Result Date: 08/20/2020 This examination belongs to an outside facility and is stored here for comparison purposes only.  Contact the originating outside institution for any associated report or interpretation.  DG Outside Films Chest  Result Date: 08/16/2020 This examination belongs to an outside facility and is stored here for comparison purposes only.  Contact the originating outside  institution for any associated report or interpretation.  CT OUTSIDE FILMS HEAD/FACE  Result Date: 08/20/2020 This examination belongs to an outside facility and is stored here for comparison purposes only.  Contact the originating outside institution for any associated report or interpretation.  CT OUTSIDE FILMS HEAD/FACE  Result Date: 08/20/2020 This examination belongs to an outside facility and is stored here for comparison purposes only.  Contact the originating outside institution for any associated report or interpretation.  CT OUTSIDE FILMS HEAD/FACE  Result Date: 08/16/2020 This examination belongs to an outside facility and is stored here for comparison purposes only.  Contact the originating outside institution for any associated report or interpretation.  CT OUTSIDE FILMS SPINE  Result Date:  08/16/2020 This examination belongs to an outside facility and is stored here for comparison purposes only.  Contact the originating outside institution for any associated report or interpretation.  CT OUTSIDE FILMS BODY/ABD/PELVIS  Result Date: 08/20/2020 This examination belongs to an outside facility and is stored here for comparison purposes only.  Contact the originating outside institution for any associated report or interpretation.   Labs:  CBC: Recent Labs    08/16/20 1005  WBC 7.0  HGB 13.0  HCT 39.2  PLT 296    COAGS: No results for input(s): INR, APTT in the last 8760 hours.  BMP: Recent Labs    08/16/20 1005  NA 139  K 3.7  CL 102  CO2 25  GLUCOSE 107*  BUN 9  CALCIUM 10.0  CREATININE 0.78  GFRNONAA >60    LIVER FUNCTION TESTS: Recent Labs    08/16/20 1005  BILITOT 0.6  AST 24  ALT 18  ALKPHOS 163*  PROT 8.6*  ALBUMIN 4.5    TUMOR MARKERS: No results for input(s): AFPTM, CEA, CA199, CHROMGRNA in the last 8760 hours.  Assessment and Plan: 55 y.o. female with PMH nephrolithiasis and bilateral breast cancers in 2018, s/p bilateral mastectomies and chemoradiation. Recent PET scan revealed:  Widespread FDG avid nodal and bony disease suspicious for metastasis. The possibility of lymphoproliferative disorder is considered given the more confluent masslike appearance of anterior mediastinal soft tissue. Biopsy may be helpful. Thoracic inlet lymph nodes may be readily accessible for tissue sampling as warranted.   Consolidative changes and parenchymal distortion in the anterior RIGHT chest related to prior radiotherapy.   Postoperative changes of bilateral mastectomy and axillary Dissection  She presents today for image guided supraclavicular lymph node biopsy for further evaluation.Risks and benefits of procedure was discussed with the patient  including, but not limited to bleeding, infection, damage to adjacent structures or low yield requiring  additional tests.  All of the questions were answered and there is agreement to proceed.  Consent signed and in chart.    Thank you for this interesting consult.  I greatly enjoyed meeting Amanda Williams and look forward to participating in their care.  A copy of this report was sent to the requesting provider on this date.  Electronically Signed: D. Rowe Robert, PA-C 09/02/2020, 11:50 AM   I spent a total of  25 minutes   in face to face in clinical consultation, greater than 50% of which was counseling/coordinating care for image guided supraclavicular lymph node biopsy

## 2020-09-02 NOTE — Discharge Instructions (Addendum)
There are no changes to your home medications ? ? ? ?Interventional radiology phone numbers ?[phone] ?After hours [phone] ? ?Needle Biopsy, Care After ?These instructions tell you how to care for yourself after your procedure. Your doctor may also give you more specific instructions. Call your doctor if you have any problems or questions. ?What can I expect after the procedure? ?After the procedure, it is common to have: ?Soreness. ?Bruising. ?Mild pain. ?Follow these instructions at home: ?Return to your normal activities as told by your doctor. Ask your doctor what activities are safe for you. ?Take over-the-counter and prescription medicines only as told by your doctor. ?Wash your hands with soap and water before you change your bandage (dressing). If you cannot use soap and water, use hand sanitizer. ?Follow instructions from your doctor about: ?How to take care of your puncture site. ?When and how to change your bandage. ?When to remove your bandage. ?Check your puncture site every day for signs of infection. Watch for: ?Redness, swelling, or pain. ?Fluid or blood. ?Pus or a bad smell. ?Warmth. ?Do not take baths, swim, or use a hot tub until your doctor approves. You may remove your dressing tomorrow and shower. ?Keep all follow-up visits as told by your doctor. This is important.   ?Contact a doctor if you have: ?A fever. ?Redness, swelling, or pain at the puncture site, and it lasts longer than a few days. ?Fluid, blood, or pus coming from the puncture site. ?Warmth coming from the puncture site. ?Get help right away if: ?You have a lot of bleeding from the puncture site. ?Summary ?After the procedure, it is common to have soreness, bruising, or mild pain at the puncture site. ?Check your puncture site every day for signs of infection, such as redness, swelling, or pain. ?Get help right away if you have severe bleeding from your puncture site. ?This information is not intended to replace advice  given to you by your health care provider. Make sure you discuss any questions you have with your health care provider. ?Document Revised: 09/07/2019 Document Reviewed: 09/07/2019 ?Elsevier Patient Education ? 2021 [employer] ? ? ? ? ?Moderate Conscious Sedation, Adult, Care After ?This sheet gives you information about how to care for yourself after your procedure. Your health care provider may also give you more specific instructions. If you have problems or questions, contact your health care provider. ?What can I expect after the procedure? ?After the procedure, it is common to have: ?Sleepiness for several hours. ?Impaired judgment for several hours. ?Difficulty with balance. ?Vomiting if you eat too soon. ?Follow these instructions at home: ?For the time period you were told by your health care provider: ?Rest. ?Do not participate in activities where you could fall or become injured. ?Do not drive or use machinery. ?Do not drink alcohol. ?Do not take sleeping pills or medicines that cause drowsiness. ?Do not make important decisions or sign legal documents. ?Do not take care of children on your own.  ?  ?  ?Eating and drinking ?Follow the diet recommended by your health care provider. ?Drink enough fluid to keep your urine pale yellow. ?If you vomit: ?Drink water, juice, or soup when you can drink without vomiting. ?Make sure you have little or no nausea before eating solid foods.   ?General instructions ?Take over-the-counter and prescription medicines only as told by your health care provider. ?Have a responsible adult stay with you for the time you are told. It is important to have someone help care   for you until you are awake and alert. ?Do not smoke. ?Keep all follow-up visits as told by your health care provider. This is important. ?Contact a health care provider if: ?You are still sleepy or having trouble with balance after 24 hours. ?You feel light-headed. ?You keep feeling nauseous or you keep  vomiting. ?You develop a rash. ?You have a fever. ?You have redness or swelling around the IV site. ?Get help right away if: ?You have trouble breathing. ?You have new-onset confusion at home. ?Summary ?After the procedure, it is common to feel sleepy, have impaired judgment, or feel nauseous if you eat too soon. ?Rest after you get home. Know the things you should not do after the procedure. ?Follow the diet recommended by your health care provider and drink enough fluid to keep your urine pale yellow. ?Get help right away if you have trouble breathing or new-onset confusion at home. ?This information is not intended to replace advice given to you by your health care provider. Make sure you discuss any questions you have with your health care provider. ?Document Revised: 07/07/2019 Document Reviewed: 02/02/2019 ?Elsevier Patient Education ? 2021 [employer]  ?

## 2020-09-02 NOTE — Procedures (Signed)
Interventional Radiology Procedure Note  Procedure: US guided lymph node biopsy  Indication: Left neck lymph adenopathy  Findings: Please refer to procedural dictation for full description.  Complications: None  EBL: < 10 mL  Miachel Roux, MD [phone]

## 2020-09-04 ENCOUNTER — Encounter: Payer: Self-pay | Admitting: Hematology and Oncology

## 2020-09-04 NOTE — Progress Notes (Signed)
Patient Care Team: Panosh, Standley Brooking, MD as PCP - General Delsa Bern, MD (Obstetrics and Gynecology) Excell Seltzer, MD (Inactive) as Consulting Physician (General Surgery) Nicholas Lose, MD as Consulting Physician (Hematology and Oncology) Kyung Rudd, MD as Consulting Physician (Radiation Oncology) Gardenia Phlegm, NP as Nurse Practitioner (Hematology and Oncology)  DIAGNOSIS:    ICD-10-CM   1. Malignant neoplasm of overlapping sites of left breast in female, estrogen receptor positive ([place])  C50.812    Z17.0       SUMMARY OF ONCOLOGIC HISTORY: Oncology History  Malignant neoplasm of overlapping 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 breast ([place])  01/12/2017 Surgery   Left mastectomy: IDC with DCIS, 2 tumors 3.6 cm ER 90%, PR 95%, Ki-67 5%, HER-2 negative ratio 1.32) and 1.2 cm (ER 95% PR 95% positive Her 2 Positive ratio 3.2, Ki-67 5%) T2 N0 stage Ib   02/16/2017 -  Chemotherapy   TCH x6 followed by Herceptin maintenance     Malignant neoplasm of overlapping sites of right breast in female, estrogen receptor positive ([place])  12/21/2016 Initial Diagnosis   Palpable lumps in the right breast with nipple inversion: Mammogram revealed skin thickening and distortion, ultrasound revealed 4 masses 3.1 cm at 11:00, 2.5 cm at 6:00, 4 cm at 8:00, 2.2 cm at 5:00 with 2 abnormal axillary lymph nodes: MRI revealed 7 cm abnormality on right breast, in addition 3.6 cm abnormality in the left breast and 2 additional masses 1 cm each; right breast: T4 N1 stage III B clinical stage; left breast: T2 N0 stage IB clinical stage    12/21/2016 Pathology Results   Right breast: Grade 1 Invasive lobular cancer, lymph node also positive, ER 70%, PR 90%, Ki-67 10%, HER-2 negative ratio 1.09 Left breast: Grade 1 IDC with DCIS prognostic panel pending    01/04/2017 Genetic Testing   The patient had genetic testing due to a personal history of bilateral breast caner and a family history of breast cancer.   The Multi-Cancer Panel was ordered. The Multi-Cancer Panel offered by [facility] includes sequencing and/or deletion duplication testing of the following 83 genes: ALK, APC, ATM, AXIN2,BAP1,  BARD1, BLM, BMPR1A, BRCA1, BRCA2, BRIP1, CASR, CDC73, CDH1, CDK4, CDKN1B, CDKN1C, CDKN2A (p14ARF), CDKN2A (p16INK4a), CEBPA, CHEK2, CTNNA1, DICER1, DIS3L2, EGFR (c.2369C>T, p.Thr790Met variant only), EPCAM (Deletion/duplication testing only), FH, FLCN, GATA2, GPC3, GREM1 (Promoter region deletion/duplication testing only), HOXB13 (c.251G>A, p.Gly84Glu), HRAS, KIT, MAX, MEN1, MET, MITF (c.952G>A, p.Glu318Lys variant only), MLH1, MSH2, MSH3, MSH6, MUTYH, NBN, NF1, NF2, NTHL1, PALB2, PDGFRA, PHOX2B, PMS2, POLD1, POLE, POT1, PRKAR1A, PTCH1, PTEN, RAD50, RAD51C, RAD51D, RB1, RECQL4, RET, RUNX1, SDHAF2, SDHA (sequence changes only), SDHB, SDHC, SDHD, SMAD4, SMARCA4, SMARCB1, SMARCE1, STK11, SUFU, TERC, TERT, TMEM127, TP53, TSC1, TSC2, VHL, WRN and WT1.   Results: Negative, no pathogenic variants identified.  The date of this test report is 01/04/2017.      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 the dermis of the skin of the nipple, margins negative, 3/3 lymph nodes positive, grade 1, EF 75%, PR 95%, HER-2 negative ratio 1.19, Ki-67 10%, T3N1A stage IIa    02/16/2017 - 06/09/2017 Chemotherapy   TCH x6 cycles followed by Herceptin maintenance    08/10/2017 - 09/22/2017 Radiation Therapy   Adjuvant radiation    09/24/2017 -  Anti-estrogen oral therapy   Letrozole 2.5 mg daily, changed to Anastrozole over 02/2018 due to arthralgias      CHIEF COMPLIANT: Follow-up for metastatic breast cancer  INTERVAL HISTORY: Amanda Haw  [name] is a 55 y.o. with above-mentioned history of bilateral breast cancers who underwent bilateral mastectomies in 2018 followed by adjuvant chemotherapy with [facility] and Herceptin maintenance followed by radiation and antiestrogen therapy currently on  anastrozole. Biopsy of lymph node in left neck on 09/02/2020 showed metastatic carcinoma. She reports to the clinic today for follow-up.   ALLERGIES:  is allergic to penicillins, sulfa antibiotics, sulfonamide derivatives, other, and adhesive [tape].  MEDICATIONS:  Current Outpatient Medications  Medication Sig Dispense Refill   acetaminophen (TYLENOL) 500 MG tablet Take 1,000 mg by mouth as needed for moderate pain (for pain/headaches.).      anastrozole (ARIMIDEX) 1 MG tablet Take 1 tablet (1 mg total) by mouth daily. 90 tablet 3   calcium carbonate (CALTRATE 600) 1500 (600 Ca) MG TABS tablet Take 1 tablet (1,500 mg total) by mouth daily with breakfast.  0   ibuprofen (ADVIL,MOTRIN) 200 MG tablet Take 400 mg by mouth as needed for moderate pain (for pain/headaches).      MULTIPLE VITAMIN PO Take 1 tablet by mouth daily.     sertraline (ZOLOFT) 100 MG tablet Take 1 tablet (100 mg total) by mouth daily. 90 tablet 0   No current facility-administered medications for this visit.    PHYSICAL EXAMINATION: ECOG PERFORMANCE STATUS: 1 - Symptomatic but completely ambulatory  Vitals:   09/05/20 1442  BP: 117/72  Pulse: 82  Resp: 18  Temp: 99.1 F (37.3 C)  SpO2: 100%   Filed Weights   09/05/20 1442  Weight: 148 lb 9.6 oz (67.4 kg)     LABORATORY DATA:  I have reviewed the data as listed CMP Latest Ref Rng & Units 08/16/2020 02/02/2018 01/26/2018  Glucose 70 - 99 mg/dL 107(H) 96 108(H)  BUN 6 - 20 mg/dL _0 Creatinine 0.44 - 1.00 mg/dL 0.78 0.95 0.89  Sodium 135 - 145 mmol/L 139 142 140  Potassium 3.5 - 5.1 mmol/L 3.7 4.2 4.1  Chloride 98 - 111 mmol/L 102 103 105  CO2 22 - 32 mmol/L _1 Calcium 8.9 - 10.3 mg/dL 10.0 9.6 9.3  Total Protein 6.5 - 8.1 g/dL 8.6(H) 7.6 7.1  Total Bilirubin 0.3 - 1.2 mg/dL 0.6 0.3 0.4  Alkaline Phos 38 - 126 U/L 163(H) 113 103  AST 15 - 41 U/L 24 17 14(L)  ALT 0 - 44 U/L _2 Lab Results  Component Value Date   WBC 7.0  08/16/2020   HGB 13.0 08/16/2020   HCT 39.2 08/16/2020   MCV 89.9 08/16/2020   PLT 296 08/16/2020   NEUTROABS 5.0 08/16/2020    ASSESSMENT & PLAN:  Malignant neoplasm of overlapping 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 breast ([place]) Bilateral breast cancers 01/12/2017: Left mastectomy: IDC with DCIS, 2 tumors 3.6 cm ER 90%, PR 95%, Ki-67 5%, HER-2 negative ratio 1.32) and 1.2 cm (ER 95% PR 95% positive Her 2 Positive ratio 3.2, Ki-67 5%) T2 N0 stage Ib Right mastectomy: Invasive and in situ lobular carcinoma involves the dermis of the skin of the nipple, margins negative, 3/3 lymph nodes positive, grade 1, ER 75%, PR 95%, HER-2 negative ratio 1.19, Ki-67 10%, T3N1A stage IIa   Treatment plan: 1.  Adjuvant chemotherapy with TCH x 4 cycles completed on 06/09/2017 (Taxotere held from final 2 cycles) followed by Herceptin maintenance for 1 year 2. Adjuvant radiation therapy started 08/10/2017-09/22/2017 3. Followed by adjuvant antiestrogen therapy letrozole started 09/24/2017 switched to anastrozole January 2020 4.  Recurrent/metastatic  disease: PET CT scan 08/23/2020: Widespread FDG avid nodal and bone disease suspicious for metastases.  Biopsy: Cervical lymph node: Metastatic breast cancer ER 0%, PR 0%, HER2 pending, Ki-67 15%  _______________________________________________________________________________________ Counseling: I discussed with the patient that the HER2 is going to be the critical marker that can determine which way we can treat her.  If she is HER2 positive we will request Enhertu. If she is HER2 negative we may consider treatment with Sacituzumab-Govitecan. We will also request for Caris molecular testing. I will request Dr. Donne Hazel to place a port.   No orders of the defined types were placed in this encounter.  The patient has a good understanding of the overall plan. she agrees with it. she will call with any problems that may develop before the next visit here.  Total time spent: 45 mins  including face to face time and time spent for planning, charting and coordination of care  Rulon Eisenmenger, MD, MPH 09/05/2020  I, Thana Ates, am acting as scribe for Dr. Nicholas Lose.  I have reviewed the above documentation for accuracy and completeness, and I agree with the above.

## 2020-09-05 ENCOUNTER — Other Ambulatory Visit: Payer: Self-pay

## 2020-09-05 ENCOUNTER — Inpatient Hospital Stay (HOSPITAL_BASED_OUTPATIENT_CLINIC_OR_DEPARTMENT_OTHER): Payer: BC Managed Care – PPO | Admitting: Hematology and Oncology

## 2020-09-05 DIAGNOSIS — Z9013 Acquired absence of bilateral breasts and nipples: Secondary | ICD-10-CM | POA: Diagnosis not present

## 2020-09-05 DIAGNOSIS — Z170 Estrogen receptor positive status [ER+]: Secondary | ICD-10-CM | POA: Diagnosis not present

## 2020-09-05 DIAGNOSIS — C50812 Malignant neoplasm of overlapping 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 breast: Secondary | ICD-10-CM | POA: Diagnosis not present

## 2020-09-05 DIAGNOSIS — C50811 Malignant neoplasm of overlapping sites of right female breast: Secondary | ICD-10-CM | POA: Diagnosis not present

## 2020-09-05 DIAGNOSIS — Z79811 Long term (current) use of aromatase inhibitors: Secondary | ICD-10-CM | POA: Diagnosis not present

## 2020-09-05 DIAGNOSIS — Z5112 Encounter for antineoplastic immunotherapy: Secondary | ICD-10-CM | POA: Diagnosis not present

## 2020-09-05 DIAGNOSIS — Z923 Personal history of irradiation: Secondary | ICD-10-CM | POA: Diagnosis not present

## 2020-09-05 DIAGNOSIS — Z9221 Personal history of antineoplastic chemotherapy: Secondary | ICD-10-CM | POA: Diagnosis not present

## 2020-09-05 NOTE — Assessment & Plan Note (Signed)
Bilateral breast cancers 01/12/2017:Left mastectomy: IDC with DCIS, 2 tumors 3.6 cm ER 90%, PR 95%, Ki-67 5%, HER-2 negative ratio 1.32) and 1.2 cm (ER 95% PR 95% positive Her 2 Positive ratio 3.2, Ki-67 5%) T2 N0 stage Ib Right mastectomy: 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 Adjuvant chemotherapy with TCH x 4 cycles completed on 06/09/2017 (Taxotere held from final 2 cycles)followed by Herceptin maintenance for 1 year 2. Adjuvant radiation therapystarted 08/10/2017-09/22/2017 3. Followed by adjuvant antiestrogen therapyletrozole started 09/24/2017 switched to anastrozole January 2020 4.  Recurrent/metastatic disease: PET CT scan 08/23/2020: Widespread FDG avid nodal and bone disease suspicious for metastases.  Biopsy: Cervical lymph node: Metastatic breast cancer  _______________________________________________________________________________________

## 2020-09-06 ENCOUNTER — Telehealth: Payer: Self-pay

## 2020-09-06 ENCOUNTER — Other Ambulatory Visit: Payer: Self-pay | Admitting: *Deleted

## 2020-09-06 ENCOUNTER — Other Ambulatory Visit: Payer: Self-pay

## 2020-09-06 ENCOUNTER — Other Ambulatory Visit: Payer: Self-pay | Admitting: Hematology and Oncology

## 2020-09-06 DIAGNOSIS — Z170 Estrogen receptor positive status [ER+]: Secondary | ICD-10-CM

## 2020-09-06 DIAGNOSIS — C50811 Malignant neoplasm of overlapping sites of right female breast: Secondary | ICD-10-CM

## 2020-09-06 DIAGNOSIS — C799 Secondary malignant neoplasm of unspecified site: Secondary | ICD-10-CM

## 2020-09-06 NOTE — Progress Notes (Signed)
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

## 2020-09-06 NOTE — Telephone Encounter (Signed)
This LPN called to inform pt that HER2 was 2+ and would need FISH study and would be read by Dr Manny on Monday. This was thoroughly explained to pt. Dr Gudena is aware.  

## 2020-09-09 ENCOUNTER — Other Ambulatory Visit: Payer: Self-pay

## 2020-09-09 ENCOUNTER — Other Ambulatory Visit: Payer: Self-pay | Admitting: Hematology and Oncology

## 2020-09-09 ENCOUNTER — Telehealth: Payer: Self-pay

## 2020-09-09 ENCOUNTER — Ambulatory Visit: Payer: BC Managed Care – PPO | Admitting: Hematology and Oncology

## 2020-09-09 DIAGNOSIS — C50011 Malignant neoplasm of nipple and areola, right female breast: Secondary | ICD-10-CM

## 2020-09-09 DIAGNOSIS — Z170 Estrogen receptor positive status [ER+]: Secondary | ICD-10-CM

## 2020-09-09 DIAGNOSIS — C50812 Malignant neoplasm of overlapping 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 breast: Secondary | ICD-10-CM

## 2020-09-09 DIAGNOSIS — C50012 Malignant neoplasm of nipple and areola, left female breast: Secondary | ICD-10-CM

## 2020-09-09 MED ORDER — PROCHLORPERAZINE MALEATE 10 MG PO TABS: 10.0000 mg | ORAL_TABLET | Freq: Four times a day (QID) | ORAL | 1 refills | Status: AC | PRN

## 2020-09-09 MED ORDER — LIDOCAINE-PRILOCAINE 2.5-2.5 % EX CREA: TOPICAL_CREAM | CUTANEOUS | 3 refills | Status: DC

## 2020-09-09 MED ORDER — ONDANSETRON HCL 8 MG PO TABS
8.0000 mg | ORAL_TABLET | Freq: Two times a day (BID) | ORAL | 1 refills | Status: AC | PRN
Start: 2020-09-09 — End: ?

## 2020-09-09 MED ORDER — LORAZEPAM 0.5 MG PO TABS: 0.5000 mg | ORAL_TABLET | Freq: Four times a day (QID) | ORAL | 0 refills | Status: DC | PRN

## 2020-09-09 NOTE — Progress Notes (Signed)
Chief Complaint  Patient presents with   Medication Consultation     HPI: Amanda Williams 55 y.o. come in for medicine management She has a history of breast cancer on Arimidex however recently was diagnosed with metastatic lesions when she had a head CT [place] a met.  She also has chest symptoms adenopathy that may be symptomatic.  Her oncologist is Dr. Sonny Dandy Last visit with me was video in September 2021 She had been given sertraline in the past with good help for panic anxiety and restarted in the recent past and asked for increased dosing recent health situation. Since she has been on the 100 mg of sertraline after 2 weeks or so has had less panic breakthrough.  To start chemo next week Tuesday and port.  She also has responsibility for helping with her grandparents who are at [facility] they are in their 12s.  ROS: See pertinent positives and negatives per HPI.  Past Medical History:  Diagnosis Date   Allergy    Breast cancer ([place])    Difficult intubation    see 01/12/17 anesthesia record   Family history of breast cancer    Hematuria    ? trigonitis   History of kidney stones    Panic attack    echo 2009 normal    Family History  Problem Relation Age of Onset   Breast cancer Maternal Grandmother 87    Social History   Socioeconomic History   Marital status: Married    Spouse name: Not on file   Number of children: Not on file   Years of education: Not on file   Highest education level: Not on file  Occupational History   Not on file  Tobacco Use   Smoking status: Never   Smokeless tobacco: Never  Vaping Use   Vaping Use: Never used  Substance and Sexual Activity   Alcohol use: Yes    Alcohol/week: 4.0 standard drinks    Types: 4 Glasses of wine per week    Comment: ocassionally   Drug use: No   Sexual activity: Not on file    Comment: Husband has had vasectomy  Other Topics Concern   Not on file  Social History  Narrative   hhof  4    Pets dog and cat.   Works 40- 19 .     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    Has smoke detector and wears seat belts.  No firearms. No excess sun exposure. Sees dentist regularly .          Social Determinants of Health   Financial Resource Strain: Not on file  Food Insecurity: Not on file  Transportation Needs: Not on file  Physical Activity: Not on file  Stress: Not on file  Social Connections: Not on file    Outpatient Medications Prior to Visit  Medication Sig Dispense Refill   acetaminophen (TYLENOL) 500 MG tablet Take 1,000 mg by mouth as needed for moderate pain (for pain/headaches.).      anastrozole (ARIMIDEX) 1 MG tablet Take 1 tablet (1 mg total) by mouth daily. 90 tablet 3   calcium carbonate (CALTRATE 600) 1500 (600 Ca) MG TABS tablet Take 1 tablet (1,500 mg total) by mouth daily with breakfast.  0   ibuprofen (ADVIL,MOTRIN) 200 MG tablet Take 400 mg by mouth as needed for moderate pain (for  pain/headaches).      lidocaine-prilocaine (EMLA) cream Apply to affected area once 30 g 3   LORazepam (ATIVAN) 0.5 MG tablet Take 1 tablet (0.5 mg total) by mouth every 6 (six) hours as needed (Nausea or vomiting). 30 tablet 0   MULTIPLE VITAMIN PO Take 1 tablet by mouth daily.     ondansetron (ZOFRAN) 8 MG tablet Take 1 tablet (8 mg total) by mouth 2 (two) times daily as needed for refractory nausea / vomiting. Start on day 3 after chemo. 30 tablet 1   prochlorperazine (COMPAZINE) 10 MG tablet Take 1 tablet (10 mg total) by mouth every 6 (six) hours as needed (Nausea or vomiting). 30 tablet 1   sertraline (ZOLOFT) 100 MG tablet Take 1 tablet (100 mg total) by mouth daily. 90 tablet 0   No facility-administered medications prior to visit.     EXAM:  BP 92/60 (BP Location: Left Arm, Patient Position: Sitting, Cuff Size: Normal)   Pulse 81   Temp 98.5 F (36.9 C) (Oral)   Ht 5' 6"  (1.676 m)   Wt 147 lb (66.7 kg)   LMP 03/14/2017  Comment: chemo therapy   SpO2 97%   BMI 23.73 kg/m   Body mass index is 23.73 kg/m.  GENERAL: vitals reviewed and listed above, alert, oriented, appears well hydrated and in no acute distress HEENT: atraumatic, conjunctiva  clear, no obvious abnormalities on inspection of external nose and ears OP : n masked NECK: no obvious masses on inspection palpation  LUNGS: clear to auscultation bilaterally, no wheezes, rales or rhonchi, good air movement CV: HRRR, no clubbing cyanosis or  peripheral edema nl cap refill  MS: moves all extremities without noticeable focal  abnormality PSYCH: pleasant and cooperative, no obvious depression or anxiety Lab Results  Component Value Date   WBC 7.0 08/16/2020   HGB 13.0 08/16/2020   HCT 39.2 08/16/2020   PLT 296 08/16/2020   GLUCOSE 107 (H) 08/16/2020   CHOL 195 05/12/2016   TRIG 107.0 05/12/2016   HDL 60.20 05/12/2016   LDLCALC 114 (H) 05/12/2016   ALT 18 08/16/2020   AST 24 08/16/2020   NA 139 08/16/2020   K 3.7 08/16/2020   CL 102 08/16/2020   CREATININE 0.78 08/16/2020   BUN 9 08/16/2020   CO2 25 08/16/2020   TSH 3.23 05/12/2016   BP Readings from Last 3 Encounters:  09/10/20 92/60  09/05/20 117/72  09/02/20 116/71    ASSESSMENT AND PLAN:  Discussed the following assessment and plan:  Panic disorder  Medication management  Metastatic breast cancer ([place])  Bilateral malignant neoplasm involving both nipple and areola in female, unspecified estrogen receptor status ([place]) Chemistries are acceptable no hyponatremia, continue the 100 mg let us know consider increasing dose as indicated but appears stable She brought up the option of therapy which is appropriate she can talk with the cancer center but also given information on [place] [facility] -Patient advised to return or notify health care team  if  new concerns arise.  Patient Instructions  Continue sertraline 100 mg    Have pharmacy contact us for  refills.  Can fu  video  in 4-6 months or as needed.     Standley Brooking. Freda Jaquith M.D. CA27.29 70.5 hers2 positive LN positive.  CT head without contrast at [facility] revealed possible lytic lesions CT chest with contrast at [facility]: Subcarinal lymph node and anterior mediastinal mass PET CT scan 08/23/2020: Widespread FDG avid nodal and bone disease suspicious  for metastases.  The possibility of lymphoproliferative disorder is considered given the confluence masslike appearance of anterior mediastinal soft tissue.

## 2020-09-09 NOTE — Progress Notes (Signed)
Final pathology on the biopsy came back as HER2 positive. Therefore we will proceed with palliative treatment with Enhertu Once again discussed with the patient risks and benefits of the treatment and she is agreeable to proceed. We will treat her the day after the port is in place on 09/18/2020.

## 2020-09-09 NOTE — Progress Notes (Signed)
This LPN called pt to inform her that referral has been entered for [facility]. Pt did not answer, LVM with contact info so she could schedule an appt with them.

## 2020-09-09 NOTE — Progress Notes (Signed)
DISCONTINUE ON PATHWAY REGIMEN - Breast   Docetaxel + Carboplatin + Trastuzumab [facility]):   A cycle is every 21 days:     Trastuzumab      Trastuzumab      Docetaxel      Carboplatin   **Always confirm dose/schedule in your pharmacy ordering system**  Trastuzumab (Maintenance Post TCH) x 11 Cycles:   A cycle is every 21 days:     Trastuzumab   **Always confirm dose/schedule in your pharmacy ordering system**  REASON: Disease Progression PRIOR TREATMENT: [id number]: TCH - Docetaxel, Carboplatin With Concurrent Trastuzumab q21 Days x 6 Cycles, Followed by Trastuzumab  q21 Days x 11 Cycles TREATMENT RESPONSE: Unable to Evaluate  START ON PATHWAY REGIMEN - Breast     A cycle is every 21 days:     Fam-trastuzumab deruxtecan-nxki   **Always confirm dose/schedule in your pharmacy ordering system**  Patient Characteristics: Distant Metastases or Locoregional Recurrent Disease - Unresected or Locally Advanced Unresectable Disease Progressing after Neoadjuvant and Local Therapies, HER2 Positive, ER Positive, Chemotherapy + HER2-Targeted Therapy, Second Line Therapeutic Status: Distant Metastases ER Status: Positive (+) HER2 Status: Positive (+) PR Status: Positive (+) Line of Therapy: Second Line Intent of Therapy: Non-Curative / Palliative Intent, Discussed with Patient

## 2020-09-09 NOTE — Telephone Encounter (Signed)
Notified [facility] of Prior Authorization approval of EMLA Cream from 09/09/20 through 12/07/2020

## 2020-09-10 ENCOUNTER — Telehealth: Payer: Self-pay | Admitting: Hematology and Oncology

## 2020-09-10 ENCOUNTER — Other Ambulatory Visit: Payer: Self-pay

## 2020-09-10 ENCOUNTER — Ambulatory Visit: Payer: BC Managed Care – PPO | Admitting: Internal Medicine

## 2020-09-10 ENCOUNTER — Encounter: Payer: Self-pay | Admitting: Internal Medicine

## 2020-09-10 VITALS — BP 92/60 | HR 81 | Temp 98.5°F | Ht 66.0 in | Wt 147.0 lb

## 2020-09-10 DIAGNOSIS — C50919 Malignant neoplasm of unspecified site of unspecified female breast: Secondary | ICD-10-CM

## 2020-09-10 DIAGNOSIS — C50012 Malignant neoplasm of nipple and areola, left female breast: Secondary | ICD-10-CM

## 2020-09-10 DIAGNOSIS — C50011 Malignant neoplasm of nipple and areola, right female breast: Secondary | ICD-10-CM

## 2020-09-10 DIAGNOSIS — Z79899 Other long term (current) drug therapy: Secondary | ICD-10-CM | POA: Diagnosis not present

## 2020-09-10 DIAGNOSIS — F410 Panic disorder [episodic paroxysmal anxiety] without agoraphobia: Secondary | ICD-10-CM

## 2020-09-10 NOTE — Telephone Encounter (Signed)
Scheduled appt per 6/20 sch msg. Pt aware.  

## 2020-09-10 NOTE — Patient Instructions (Signed)
Continue sertraline 100 mg    Have pharmacy contact us for refills.  Can fu  video  in 4-6 months or as needed.

## 2020-09-11 NOTE — Progress Notes (Signed)
Pharmacist Chemotherapy Monitoring - Initial Assessment    Anticipated start date: 09/18/20   Regimen:  Are orders appropriate based on the patient's diagnosis, regimen, and cycle? Yes Does the plan date match the patient's scheduled date? Yes Is the sequencing of drugs appropriate? Yes Are the premedications appropriate for the patient's regimen? Yes Prior Authorization for treatment is: Not Started If applicable, is the correct biosimilar selected based on the patient's insurance? not applicable  Organ Function and Labs: Are dose adjustments needed based on the patient's renal function, hepatic function, or hematologic function? Yes Are appropriate labs ordered prior to the start of patient's treatment? Yes Other organ system assessment, if indicated: trastuzumab: Echo/ MUGA The following baseline labs, if indicated, have been ordered: N/A  Dose Assessment: Are the drug doses appropriate? Yes Are the following correct: Drug concentrations Yes IV fluid compatible with drug Yes Administration routes Yes Timing of therapy Yes If applicable, does the patient have documented access for treatment and/or plans for port-a-cath placement? yes If applicable, have lifetime cumulative doses been properly documented and assessed? yes Lifetime Dose Tracking   Carboplatin: 3,780 mg = 0.01 % of the maximum lifetime dose of 999,999,999 mg    Toxicity Monitoring/Prevention: The patient has the following take home antiemetics prescribed: Ondansetron and Prochlorperazine The patient has the following take home medications prescribed: N/A Medication allergies and previous infusion related reactions, if applicable, have been reviewed and addressed. No The patient's current medication list has been assessed for drug-drug interactions with their chemotherapy regimen. no significant drug-drug interactions were identified on review.  Order Review: Are the treatment plan orders signed? Yes Is the  patient scheduled to see a provider prior to their treatment? Yes  I verify that I have reviewed each item in the above checklist and answered each question accordingly.  Larene Beach, RPH, 09/11/2020  11:26 AM

## 2020-09-16 ENCOUNTER — Other Ambulatory Visit: Payer: Self-pay | Admitting: Student

## 2020-09-17 ENCOUNTER — Ambulatory Visit (HOSPITAL_COMMUNITY)
Admission: RE | Admit: 2020-09-17 | Discharge: 2020-09-17 | Disposition: A | Discharge status: Home / Self Care | Payer: BC Managed Care – PPO | Source: Ambulatory Visit | Attending: Hematology and Oncology | Admitting: Hematology and Oncology

## 2020-09-17 ENCOUNTER — Other Ambulatory Visit: Payer: Self-pay

## 2020-09-17 ENCOUNTER — Other Ambulatory Visit: Payer: Self-pay | Admitting: Hematology and Oncology

## 2020-09-17 ENCOUNTER — Encounter (HOSPITAL_COMMUNITY): Payer: Self-pay

## 2020-09-17 DIAGNOSIS — Z79899 Other long term (current) drug therapy: Secondary | ICD-10-CM | POA: Diagnosis not present

## 2020-09-17 DIAGNOSIS — C50919 Malignant neoplasm of unspecified site of unspecified female breast: Secondary | ICD-10-CM | POA: Diagnosis not present

## 2020-09-17 DIAGNOSIS — C799 Secondary malignant neoplasm of unspecified site: Secondary | ICD-10-CM

## 2020-09-17 DIAGNOSIS — Z880 Allergy status to penicillin: Secondary | ICD-10-CM | POA: Insufficient documentation

## 2020-09-17 DIAGNOSIS — Z882 Allergy status to sulfonamides status: Secondary | ICD-10-CM | POA: Insufficient documentation

## 2020-09-17 DIAGNOSIS — Z888 Allergy status to other drugs, medicaments and biological substances status: Secondary | ICD-10-CM | POA: Diagnosis not present

## 2020-09-17 DIAGNOSIS — Z452 Encounter for adjustment and management of vascular access device: Secondary | ICD-10-CM | POA: Diagnosis not present

## 2020-09-17 HISTORY — PX: IR IMAGING GUIDED PORT INSERTION: IMG5740

## 2020-09-17 MED ORDER — FENTANYL CITRATE (PF) 100 MCG/2ML IJ SOLN
INTRAMUSCULAR | Status: AC
  Filled 2020-09-17: qty 2

## 2020-09-17 MED ORDER — MIDAZOLAM HCL 2 MG/2ML IJ SOLN
INTRAMUSCULAR | Status: AC
  Filled 2020-09-17: qty 2

## 2020-09-17 MED ORDER — HEPARIN SOD (PORK) LOCK FLUSH 100 UNIT/ML IV SOLN
INTRAVENOUS | Status: AC
  Filled 2020-09-17: qty 5

## 2020-09-17 MED ORDER — MIDAZOLAM HCL 2 MG/2ML IJ SOLN
INTRAMUSCULAR | Status: DC | PRN
  Administered 2020-09-17 (×3): 1 mg via INTRAVENOUS

## 2020-09-17 MED ORDER — LIDOCAINE-EPINEPHRINE 1 %-1:100000 IJ SOLN
INTRAMUSCULAR | Status: AC
  Administered 2020-09-17: 20 mL
  Filled 2020-09-17: qty 1

## 2020-09-17 MED ORDER — SODIUM CHLORIDE 0.9 % IV SOLN: INTRAVENOUS | Status: DC

## 2020-09-17 MED ORDER — FENTANYL CITRATE (PF) 100 MCG/2ML IJ SOLN
INTRAMUSCULAR | Status: DC | PRN
  Administered 2020-09-17: 50 ug via INTRAVENOUS

## 2020-09-17 NOTE — Assessment & Plan Note (Signed)
Bilateral breast cancers 01/12/2017:Left mastectomy: IDC with DCIS, 2 tumors 3.6 cm ER 90%, PR 95%, Ki-67 5%, HER-2 negative ratio 1.32) and 1.2 cm (ER 95% PR 95% positive Her 2 Positive ratio 3.2, Ki-67 5%) T2 N0 stage Ib Right mastectomy: 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 Adjuvant chemotherapy with TCH x 4 cycles completed on 06/09/2017 (Taxotere held from final 2 cycles)followed by Herceptin maintenance for 1 year 2. Adjuvant radiation therapystarted 08/10/2017-09/22/2017 3. Followed by adjuvant antiestrogen therapyletrozole started 09/24/2017 switched to anastrozole January 2020 4.  Recurrent/metastatic disease: PET CT scan 08/23/2020: Widespread FDG avid nodal and bone disease suspicious for metastases.  Biopsy: Cervical lymph node: Metastatic breast cancer ER 0%, PR 0%, HER2 positive, Ki-67 15% _______________________________________________________________________________________ Treatment Plan: Enhertu q 3 weeks

## 2020-09-17 NOTE — H&P (Signed)
Referring Physician(s): Nicholas Lose  Supervising Physician: Sandi Mariscal  Patient Status:  [facility]  Chief Complaint:  "I'm here for a port a cath"  Subjective: Patient familiar to IR service from left neck lymph node biopsy on 09/02/2020. She has a PMH of nephrolithiasis and bilateral breast cancers in 2018, s/p bilateral mastectomies and chemoradiation.  Pathology from above-noted biopsy revealed metastatic breast carcinoma.  She presents today for Port-A-Cath placement for planned chemotherapy.  She currently denies fever, headache, worsening dyspnea, cough, abdominal/back pain, nausea, vomiting or bleeding.  She does have some intermittent chest discomfort.  Past Medical History:  Diagnosis Date   Allergy    Breast cancer ([place])    Difficult intubation    see 01/12/17 anesthesia record   Family history of breast cancer    Hematuria    ? trigonitis   History of kidney stones    Panic attack    echo 2009 normal   Past Surgical History:  Procedure Laterality Date   BREAST BIOPSY     left   BREAST RECONSTRUCTION WITH PLACEMENT OF TISSUE EXPANDER AND FLEX HD (ACELLULAR HYDRATED DERMIS) Bilateral 01/12/2017   Procedure: BREAST RECONSTRUCTION WITH PLACEMENT OF TISSUE EXPANDER AND ALLODERM;  Surgeon: Irene Limbo, MD;  Location: [place];  Service: Plastics;  Laterality: Bilateral;   COLONOSCOPY     LIPOSUCTION WITH LIPOFILLING Bilateral 11/02/2017   Procedure: lipofilling from abdomen to bilateral chest;  Surgeon: Irene Limbo, MD;  Location: [place];  Service: Plastics;  Laterality: Bilateral;   MASTECTOMY W/ SENTINEL NODE BIOPSY Bilateral 01/12/2017   Procedure: RIGHT MODIFIED RADICAL MASTECTOMY, LEFT TOTAL MASTECTOMY WITH LEFT SENTINEL LYMPH NODE BIOPSY;  Surgeon: Rolm Bookbinder, MD;  Location: [place];  Service: General;  Laterality: Bilateral;   PORTACATH PLACEMENT N/A 02/15/2017   Procedure: INSERTION  PORT-A-CATH WITH [place];  Surgeon: Rolm Bookbinder, MD;  Location: [place];  Service: General;  Laterality: N/A;   REMOVAL OF TISSUE EXPANDER AND PLACEMENT OF IMPLANT Bilateral 07/16/2017   Procedure: BILATERAL REMOVAL OF TISSUE EXPANDER AND PLACEMENT OF SILICONE IMPLANT;  Surgeon: Irene Limbo, MD;  Location: [place];  Service: Plastics;  Laterality: Bilateral;      Allergies: Penicillins, Sulfa antibiotics, Sulfonamide derivatives, Other, and Adhesive [tape]  Medications: Prior to Admission medications   Medication Sig Start Date End Date Taking? Authorizing Provider  acetaminophen (TYLENOL) 500 MG tablet Take 1,000 mg by mouth as needed for moderate pain (for pain/headaches.).    Yes [provider]  calcium carbonate (CALTRATE 600) 1500 (600 Ca) MG TABS tablet Take 1 tablet (1,500 mg total) by mouth daily with breakfast. 10/27/18  Yes Nicholas Lose, MD  ibuprofen (ADVIL,MOTRIN) 200 MG tablet Take 400 mg by mouth as needed for moderate pain (for pain/headaches).    Yes [provider]  MULTIPLE VITAMIN PO Take 1 tablet by mouth daily.   Yes [provider]  sertraline (ZOLOFT) 100 MG tablet Take 1 tablet (100 mg total) by mouth daily. 08/22/20  Yes Panosh, Standley Brooking, MD  anastrozole (ARIMIDEX) 1 MG tablet Take 1 tablet (1 mg total) by mouth daily. 10/27/19   Nicholas Lose, MD  lidocaine-prilocaine (EMLA) cream Apply to affected area once 09/09/20   Nicholas Lose, MD  LORazepam (ATIVAN) 0.5 MG tablet Take 1 tablet (0.5 mg total) by mouth every 6 (six) hours as needed (Nausea or vomiting). 09/09/20   Nicholas Lose, MD  ondansetron (ZOFRAN) 8 MG tablet Take 1 tablet (8 mg total)  by mouth 2 (two) times daily as needed for refractory nausea / vomiting. Start on day 3 after chemo. 09/09/20   Nicholas Lose, MD  prochlorperazine (COMPAZINE) 10 MG tablet Take 1 tablet (10 mg total) by mouth every 6 (six) hours as needed (Nausea or vomiting). 09/09/20   Nicholas Lose, MD     Vital  Signs: BP 117/75   Pulse 88   Temp 98.5 F (36.9 C) (Oral)   Resp 16   LMP 03/14/2017 Comment: chemo therapy   SpO2 97%   Physical Exam awake, alert.  Chest clear to auscultation bilaterally.  Heart with regular rate and rhythm.  Abdomen soft, positive bowel sounds, nontender.  No lower extremity edema.  Imaging: No results found.  Labs:  CBC: Recent Labs    08/16/20 1005  WBC 7.0  HGB 13.0  HCT 39.2  PLT 296    COAGS: No results for input(s): INR, APTT in the last 8760 hours.  BMP: Recent Labs    08/16/20 1005  NA 139  K 3.7  CL 102  CO2 25  GLUCOSE 107*  BUN 9  CALCIUM 10.0  CREATININE 0.78  GFRNONAA >60    LIVER FUNCTION TESTS: Recent Labs    08/16/20 1005  BILITOT 0.6  AST 24  ALT 18  ALKPHOS 163*  PROT 8.6*  ALBUMIN 4.5    Assessment and Plan: Patient familiar to IR service from left neck lymph node biopsy on 09/02/2020. She has a PMH of nephrolithiasis and bilateral breast cancers in 2018, s/p bilateral mastectomies and chemoradiation.  Pathology from above-noted biopsy revealed metastatic breast carcinoma.  She presents today for Port-A-Cath placement for planned chemotherapy. Risks and benefits of image guided port-a-catheter placement was discussed with the patient including, but not limited to bleeding, infection, pneumothorax, or fibrin sheath development and need for additional procedures.  All of the patient's questions were answered, patient is agreeable to proceed. Consent signed and in chart.    Electronically Signed: D. Rowe Robert, PA-C 09/17/2020, 1:05 PM   I spent a total of 25 Minutes at the the patient's bedside AND on the patient's hospital floor or unit, greater than 50% of which was counseling/coordinating care for Port-A-Cath placement

## 2020-09-17 NOTE — Progress Notes (Signed)
Reading  Patient Care Team: Panosh, Standley Brooking, MD as PCP - General Amanda Bern, MD (Obstetrics and Gynecology) Excell Seltzer, MD (Inactive) as Consulting Physician (General Surgery) Nicholas Lose, MD as Consulting Physician (Hematology and Oncology) Kyung Rudd, MD as Consulting Physician (Radiation Oncology) Gardenia Phlegm, NP as Nurse Practitioner (Hematology and Oncology)  DIAGNOSIS:    ICD-10-CM   1. Malignant neoplasm of overlapping sites of left breast in female, estrogen receptor negative ([place])  C50.812    Z17.1       SUMMARY OF ONCOLOGIC HISTORY: Oncology History  Malignant neoplasm of overlapping 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 breast ([place])  01/12/2017 Surgery   Left mastectomy: IDC with DCIS, 2 tumors 3.6 cm ER 90%, PR 95%, Ki-67 5%, HER-2 negative ratio 1.32) and 1.2 cm (ER 95% PR 95% positive Her 2 Positive ratio 3.2, Ki-67 5%) T2 N0 stage Ib   02/16/2017 -  Chemotherapy   TCH x6 followed by Herceptin maintenance     Malignant neoplasm of overlapping sites of right breast in female, estrogen receptor positive ([place])  12/21/2016 Initial Diagnosis   Palpable lumps in the right breast with nipple inversion: Mammogram revealed skin thickening and distortion, ultrasound revealed 4 masses 3.1 cm at 11:00, 2.5 cm at 6:00, 4 cm at 8:00, 2.2 cm at 5:00 with 2 abnormal axillary lymph nodes: MRI revealed 7 cm abnormality on right breast, in addition 3.6 cm abnormality in the left breast and 2 additional masses 1 cm each; right breast: T4 N1 stage III B clinical stage; left breast: T2 N0 stage IB clinical stage    12/21/2016 Pathology Results   Right breast: Grade 1 Invasive lobular cancer, lymph node also positive, ER 70%, PR 90%, Ki-67 10%, HER-2 negative ratio 1.09 Left breast: Grade 1 IDC with DCIS prognostic panel pending    01/04/2017 Genetic Testing   The patient had genetic testing due to a personal history of bilateral breast caner and a family history of breast  cancer.  The Multi-Cancer Panel was ordered. The Multi-Cancer Panel offered by [facility] includes sequencing and/or deletion duplication testing of the following 83 genes: ALK, APC, ATM, AXIN2,BAP1,  BARD1, BLM, BMPR1A, BRCA1, BRCA2, BRIP1, CASR, CDC73, CDH1, CDK4, CDKN1B, CDKN1C, CDKN2A (p14ARF), CDKN2A (p16INK4a), CEBPA, CHEK2, CTNNA1, DICER1, DIS3L2, EGFR (c.2369C>T, p.Thr790Met variant only), EPCAM (Deletion/duplication testing only), FH, FLCN, GATA2, GPC3, GREM1 (Promoter region deletion/duplication testing only), HOXB13 (c.251G>A, p.Gly84Glu), HRAS, KIT, MAX, MEN1, MET, MITF (c.952G>A, p.Glu318Lys variant only), MLH1, MSH2, MSH3, MSH6, MUTYH, NBN, NF1, NF2, NTHL1, PALB2, PDGFRA, PHOX2B, PMS2, POLD1, POLE, POT1, PRKAR1A, PTCH1, PTEN, RAD50, RAD51C, RAD51D, RB1, RECQL4, RET, RUNX1, SDHAF2, SDHA (sequence changes only), SDHB, SDHC, SDHD, SMAD4, SMARCA4, SMARCB1, SMARCE1, STK11, SUFU, TERC, TERT, TMEM127, TP53, TSC1, TSC2, VHL, WRN and WT1.   Results: Negative, no pathogenic variants identified.  The date of this test report is 01/04/2017.      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 the dermis of the skin of the nipple, margins negative, 3/3 lymph nodes positive, grade 1, EF 75%, PR 95%, HER-2 negative ratio 1.19, Ki-67 10%, T3N1A stage IIa    02/16/2017 - 06/09/2017 Chemotherapy   TCH x6 cycles followed by Herceptin maintenance    08/10/2017 - 09/22/2017 Radiation Therapy   Adjuvant radiation    09/24/2017 -  Anti-estrogen oral therapy   Letrozole 2.5 mg daily, changed to Anastrozole over 02/2018 due to arthralgias    Bilateral breast cancer ([place])  01/12/2017 Initial Diagnosis   Bilateral breast  cancer ([place])    09/18/2020 -  Chemotherapy    Patient is on Treatment Plan: BREAST METASTATIC FAM-TRASTUZUMAB DERUXTECAN-NXKI (ENHERTU) Q21D         CHIEF COMPLIANT: Cycle 1 Enhertu  INTERVAL HISTORY: Amanda Williams is a 55 y.o. with above-mentioned  history of recurrent/ metastatic breast cancer. The pt had Port insertion yesterday.  He final pathology on the patient's recent biopsy came back as HER2 positive. The patient presents to the clinic for cycle 1 treatment of Enhertu.  Port was placed yesterday and she is slightly sore from that.  She continues to have slight discomfort in the middle of the chest with regards to her breathing  ALLERGIES:  is allergic to penicillins, sulfa antibiotics, sulfonamide derivatives, other, and adhesive [tape].  MEDICATIONS:  Current Outpatient Medications  Medication Sig Dispense Refill   acetaminophen (TYLENOL) 500 MG tablet Take 1,000 mg by mouth as needed for moderate pain (for pain/headaches.).      anastrozole (ARIMIDEX) 1 MG tablet Take 1 tablet (1 mg total) by mouth daily. 90 tablet 3   calcium carbonate (CALTRATE 600) 1500 (600 Ca) MG TABS tablet Take 1 tablet (1,500 mg total) by mouth daily with breakfast.  0   ibuprofen (ADVIL,MOTRIN) 200 MG tablet Take 400 mg by mouth as needed for moderate pain (for pain/headaches).      lidocaine-prilocaine (EMLA) cream Apply to affected area once 30 g 3   LORazepam (ATIVAN) 0.5 MG tablet Take 1 tablet (0.5 mg total) by mouth every 6 (six) hours as needed (Nausea or vomiting). 30 tablet 0   MULTIPLE VITAMIN PO Take 1 tablet by mouth daily.     ondansetron (ZOFRAN) 8 MG tablet Take 1 tablet (8 mg total) by mouth 2 (two) times daily as needed for refractory nausea / vomiting. Start on day 3 after chemo. 30 tablet 1   prochlorperazine (COMPAZINE) 10 MG tablet Take 1 tablet (10 mg total) by mouth every 6 (six) hours as needed (Nausea or vomiting). 30 tablet 1   sertraline (ZOLOFT) 100 MG tablet Take 1 tablet (100 mg total) by mouth daily. 90 tablet 0   No current facility-administered medications for this visit.    PHYSICAL EXAMINATION: ECOG PERFORMANCE STATUS: 1 - Symptomatic but completely ambulatory  Vitals:   09/18/20 0846  BP: 120/73  Pulse: 78   Resp: 18  Temp: 97.9 F (36.6 C)  SpO2: 99%   Filed Weights   09/18/20 0846  Weight: 148 lb 9.6 oz (67.4 kg)    BREAST:  LABORATORY DATA:  I have reviewed the data as listed CMP Latest Ref Rng & Units 09/18/2020 08/16/2020 02/02/2018  Glucose 70 - 99 mg/dL 133(H) 107(H) 96  BUN 6 - 20 mg/dL 13 9 15   Creatinine 0.44 - 1.00 mg/dL 0.80 0.78 0.95  Sodium 135 - 145 mmol/L 139 139 142  Potassium 3.5 - 5.1 mmol/L 4.0 3.7 4.2  Chloride 98 - 111 mmol/L 105 102 103  CO2 22 - 32 mmol/L 24 25 29   Calcium 8.9 - 10.3 mg/dL 9.1 10.0 9.6  Total Protein 6.5 - 8.1 g/dL 7.2 8.6(H) 7.6  Total Bilirubin 0.3 - 1.2 mg/dL 0.3 0.6 0.3  Alkaline Phos 38 - 126 U/L 175(H) 163(H) 113  AST 15 - 41 U/L 23 24 17   ALT 0 - 44 U/L 17 18 15     Lab Results  Component Value Date   WBC 5.8 09/18/2020   HGB 12.3 09/18/2020   HCT 36.0 09/18/2020   MCV  89.8 09/18/2020   PLT 257 09/18/2020   NEUTROABS 3.9 09/18/2020    ASSESSMENT & PLAN:  Malignant neoplasm of overlapping 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 breast [facility]) Bilateral breast cancers 01/12/2017: Left mastectomy: IDC with DCIS, 2 tumors 3.6 cm ER 90%, PR 95%, Ki-67 5%, HER-2 negative ratio 1.32) and 1.2 cm (ER 95% PR 95% positive Her 2 Positive ratio 3.2, Ki-67 5%) T2 N0 stage Ib Right mastectomy: Invasive and in situ lobular carcinoma involves the dermis of the skin of the nipple, margins negative, 3/3 lymph nodes positive, grade 1, ER 75%, PR 95%, HER-2 negative ratio 1.19, Ki-67 10%, T3N1A stage IIa   Treatment plan: 1.  Adjuvant chemotherapy with TCH x 4 cycles completed on 06/09/2017 (Taxotere held from final 2 cycles) followed by Herceptin maintenance for 1 year 2. Adjuvant radiation therapy started 08/10/2017-09/22/2017 3. Followed by adjuvant antiestrogen therapy letrozole started 09/24/2017 switched to anastrozole January 2020 4.  Recurrent/metastatic disease: PET CT scan 08/23/2020: Widespread FDG avid nodal and bone disease suspicious for metastases.  Biopsy:  Cervical lymph node: Metastatic breast cancer ER 0%, PR 0%, HER2 positive, Ki-67 15%  _______________________________________________________________________________________ Treatment Plan: Enhertu q 3 weeks I also ordered Xgeva to be given along with her next treatment.  We will try to get that authorized for her bone metastatic disease. We briefly discussed the side effects of Enhertu once again. Return to clinic in 3 weeks for cycle 2.  No orders of the defined types were placed in this encounter.  The patient has a good understanding of the overall plan. she agrees with it. she will call with any problems that may develop before the next visit here.  Total time spent: 30 mins including face to face time and time spent for planning, charting and coordination of care  Rulon Eisenmenger, MD, MPH 09/18/2020  I, Reinaldo Raddle, am acting as scribe for Dr. Nicholas Lose, MD.

## 2020-09-17 NOTE — Progress Notes (Signed)
Port left accessed per Dr. Pascal Lux discussion with Oncologist.

## 2020-09-17 NOTE — Procedures (Signed)
Pre Procedure Dx: Metastatic breast cancer Post Procedural Dx: Same  Successful placement of right IJ approach port-a-cath with tip at the superior caval atrial junction. The catheter is ready for immediate use.  Estimated Blood Loss: Minimal  Complications: None immediate.  Ronny Bacon, MD Pager #: [phone]

## 2020-09-17 NOTE — Discharge Instructions (Signed)
Interventional radiology phone numbers °[phone] °After hours [phone] ° ° ° °You have skin glue (dermabond) over your new port. Do not use the lidocaine cream (EMLA cream) over the skin glue until it has healed. The petroleum in the lidocaine cream will dissolve the skin glue resulting in an infection of your new port. Use ice in a zip lock bag for 1-2 minutes over your new port before the cancer center nurses access your port. ° ° °Implanted Port Insertion, Care After °This sheet gives you information about how to care for yourself after your procedure. Your health care provider may also give you more specific instructions. If you have problems or questions, contact your health care provider. °What can I expect after the procedure? °After the procedure, it is common to have: °Discomfort at the port insertion site. °Bruising on the skin over the port. This should improve over 3-4 days. °Follow these instructions at home: °Port care °After your port is placed, you will get a manufacturer's information card. The card has information about your port. Keep this card with you at all times. °Take care of the port as told by your health care provider. Ask your health care provider if you or a family member can get training for taking care of the port at home. A home health care nurse may also take care of the port. °Make sure to remember what type of port you have. °Incision care °Follow instructions from your health care provider about how to take care of your port insertion site. Make sure you: °Wash your hands with soap and water before and after you change your bandage (dressing). If soap and water are not available, use hand sanitizer. °Change your dressing as told by your health care provider. °Leave skin glue in place. These skin closures may need to stay in place for 2 weeks or longer.  °Check your port insertion site every day for signs of infection. Check for: °Redness, swelling, or pain. °Fluid or  blood. °Warmth. °Pus or a bad smell.  °  °  °Activity °Return to your normal activities as told by your health care provider. Ask your health care provider what activities are safe for you. °Do not lift anything that is heavier than 10 lb (4.5 kg), or the limit that you are told, until your health care provider says that it is safe. °General instructions °Take over-the-counter and prescription medicines only as told by your health care provider. °Do not take baths, swim, or use a hot tub until your health care provider approves.You may remove your dressing tomorrow and shower 24 hours after your procedure. °Do not drive for 24 hours if you were given a sedative during your procedure. °Wear a medical alert bracelet in case of an emergency. This will tell any health care providers that you have a port. °Keep all follow-up visits as told by your health care provider. This is important. °Contact a health care provider if: °You cannot flush your port with saline as directed, or you cannot draw blood from the port. °You have a fever or chills. °You have redness, swelling, or pain around your port insertion site. °You have fluid or blood coming from your port insertion site. °Your port insertion site feels warm to the touch. °You have pus or a bad smell coming from the port insertion site. °Get help right away if: °You have chest pain or shortness of breath. °You have bleeding from your port that you cannot control. °Summary °Take care of   the port as told by your health care provider. Keep the manufacturer's information card with you at all times. °Change your dressing as told by your health care provider. °Contact a health care provider if you have a fever or chills or if you have redness, swelling, or pain around your port insertion site. °Keep all follow-up visits as told by your health care provider. °This information is not intended to replace advice given to you by your health care provider. Make sure you discuss any  questions you have with your health care provider. °Document Revised: 10/05/2017 Document Reviewed: 10/05/2017 °Elsevier Patient Education © 2021 [employer] ° ° ° °Moderate Conscious Sedation, Adult, Care After °This sheet gives you information about how to care for yourself after your procedure. Your health care provider may also give you more specific instructions. If you have problems or questions, contact your health care provider. °What can I expect after the procedure? °After the procedure, it is common to have: °Sleepiness for several hours. °Impaired judgment for several hours. °Difficulty with balance. °Vomiting if you eat too soon. °Follow these instructions at home: °For the time period you were told by your health care provider: °Rest. °Do not participate in activities where you could fall or become injured. °Do not drive or use machinery. °Do not drink alcohol. °Do not take sleeping pills or medicines that cause drowsiness. °Do not make important decisions or sign legal documents. °Do not take care of children on your own.  °  °  °Eating and drinking °Follow the diet recommended by your health care provider. °Drink enough fluid to keep your urine pale yellow. °If you vomit: °Drink water, juice, or soup when you can drink without vomiting. °Make sure you have little or no nausea before eating solid foods.   °General instructions °Take over-the-counter and prescription medicines only as told by your health care provider. °Have a responsible adult stay with you for the time you are told. It is important to have someone help care for you until you are awake and alert. °Do not smoke. °Keep all follow-up visits as told by your health care provider. This is important. °Contact a health care provider if: °You are still sleepy or having trouble with balance after 24 hours. °You feel light-headed. °You keep feeling nauseous or you keep vomiting. °You develop a rash. °You have a fever. °You have redness or  swelling around the IV site. °Get help right away if: °You have trouble breathing. °You have new-onset confusion at home. °Summary °After the procedure, it is common to feel sleepy, have impaired judgment, or feel nauseous if you eat too soon. °Rest after you get home. Know the things you should not do after the procedure. °Follow the diet recommended by your health care provider and drink enough fluid to keep your urine pale yellow. °Get help right away if you have trouble breathing or new-onset confusion at home. °This information is not intended to replace advice given to you by your health care provider. Make sure you discuss any questions you have with your health care provider. °Document Revised: 07/07/2019 Document Reviewed: 02/02/2019 °Elsevier Patient Education © 2021 [employer]  °

## 2020-09-18 ENCOUNTER — Inpatient Hospital Stay: Payer: BC Managed Care – PPO

## 2020-09-18 ENCOUNTER — Inpatient Hospital Stay (HOSPITAL_BASED_OUTPATIENT_CLINIC_OR_DEPARTMENT_OTHER): Payer: BC Managed Care – PPO | Admitting: Hematology and Oncology

## 2020-09-18 ENCOUNTER — Other Ambulatory Visit: Payer: Self-pay | Admitting: *Deleted

## 2020-09-18 DIAGNOSIS — C50011 Malignant neoplasm of nipple and areola, right female breast: Secondary | ICD-10-CM

## 2020-09-18 DIAGNOSIS — Z9013 Acquired absence of bilateral breasts and nipples: Secondary | ICD-10-CM | POA: Diagnosis not present

## 2020-09-18 DIAGNOSIS — Z170 Estrogen receptor positive status [ER+]: Secondary | ICD-10-CM

## 2020-09-18 DIAGNOSIS — C50812 Malignant neoplasm of overlapping 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 breast: Secondary | ICD-10-CM | POA: Diagnosis not present

## 2020-09-18 DIAGNOSIS — Z923 Personal history of irradiation: Secondary | ICD-10-CM | POA: Diagnosis not present

## 2020-09-18 DIAGNOSIS — Z9221 Personal history of antineoplastic chemotherapy: Secondary | ICD-10-CM | POA: Diagnosis not present

## 2020-09-18 DIAGNOSIS — Z5112 Encounter for antineoplastic immunotherapy: Secondary | ICD-10-CM | POA: Diagnosis not present

## 2020-09-18 DIAGNOSIS — Z79811 Long term (current) use of aromatase inhibitors: Secondary | ICD-10-CM | POA: Diagnosis not present

## 2020-09-18 DIAGNOSIS — C50811 Malignant neoplasm of overlapping sites of right female breast: Secondary | ICD-10-CM | POA: Diagnosis not present

## 2020-09-18 DIAGNOSIS — Z171 Estrogen receptor negative status [ER-]: Secondary | ICD-10-CM

## 2020-09-18 DIAGNOSIS — Z5181 Encounter for therapeutic drug level monitoring: Secondary | ICD-10-CM

## 2020-09-18 LAB — CMP (CANCER CENTER ONLY)
ALT: 17 U/L (ref 0–44)
AST: 23 U/L (ref 15–41)
Albumin: 3.7 g/dL (ref 3.5–5.0)
Alkaline Phosphatase: 175 U/L — ABNORMAL HIGH (ref 38–126)
Anion gap: 10 (ref 5–15)
BUN: 13 mg/dL (ref 6–20)
CO2: 24 mmol/L (ref 22–32)
Calcium: 9.1 mg/dL (ref 8.9–10.3)
Chloride: 105 mmol/L (ref 98–111)
Creatinine: 0.8 mg/dL (ref 0.44–1.00)
GFR, Estimated: >60 mL/min (ref >60)
Glucose, Bld: 133 mg/dL — ABNORMAL HIGH (ref 70–99)
Potassium: 4 mmol/L (ref 3.5–5.1)
Sodium: 139 mmol/L (ref 135–145)
Total Bilirubin: 0.3 mg/dL (ref 0.3–1.2)
Total Protein: 7.2 g/dL (ref 6.5–8.1)

## 2020-09-18 LAB — CBC WITH DIFFERENTIAL (CANCER CENTER ONLY)
Abs Immature Granulocytes: 0.02 10*3/uL (ref 0.00–0.07)
Basophils Absolute: 0 10*3/uL (ref 0.0–0.1)
Basophils Relative: 1 %
Eosinophils Absolute: 0.1 10*3/uL (ref 0.0–0.5)
Eosinophils Relative: 1 %
HCT: 36 % (ref 36.0–46.0)
Hemoglobin: 12.3 g/dL (ref 12.0–15.0)
Immature Granulocytes: 0 %
Lymphocytes Relative: 24 %
Lymphs Abs: 1.4 10*3/uL (ref 0.7–4.0)
MCH: 30.7 pg (ref 26.0–34.0)
MCHC: 34.2 g/dL (ref 30.0–36.0)
MCV: 89.8 fL (ref 80.0–100.0)
Monocytes Absolute: 0.4 10*3/uL (ref 0.1–1.0)
Monocytes Relative: 7 %
Neutro Abs: 3.9 10*3/uL (ref 1.7–7.7)
Neutrophils Relative %: 67 %
Platelet Count: 257 10*3/uL (ref 150–400)
RBC: 4.01 MIL/uL (ref 3.87–5.11)
RDW: 12.1 % (ref 11.5–15.5)
WBC Count: 5.8 10*3/uL (ref 4.0–10.5)
nRBC: 0 % (ref 0.0–0.2)

## 2020-09-18 MED ORDER — ACETAMINOPHEN 325 MG PO TABS
ORAL_TABLET | ORAL | Status: AC
  Filled 2020-09-18: qty 2

## 2020-09-18 MED ORDER — FAM-TRASTUZUMAB DERUXTECAN-NXKI CHEMO 100 MG IV SOLR
5.4000 mg/kg | Freq: Once | INTRAVENOUS | Status: AC
  Administered 2020-09-18: 364 mg via INTRAVENOUS
  Filled 2020-09-18: qty 18.2

## 2020-09-18 MED ORDER — SODIUM CHLORIDE 0.9% FLUSH
10.0000 mL | Freq: Once | INTRAVENOUS | Status: AC
  Administered 2020-09-18: 10 mL
  Filled 2020-09-18: qty 10

## 2020-09-18 MED ORDER — PALONOSETRON HCL INJECTION 0.25 MG/5ML
0.2500 mg | Freq: Once | INTRAVENOUS | Status: AC
  Administered 2020-09-18: 0.25 mg via INTRAVENOUS

## 2020-09-18 MED ORDER — DEXTROSE 5 % IV SOLN
Freq: Once | INTRAVENOUS | Status: AC
  Filled 2020-09-18: qty 250

## 2020-09-18 MED ORDER — DIPHENHYDRAMINE HCL 25 MG PO CAPS
50.0000 mg | ORAL_CAPSULE | Freq: Once | ORAL | Status: AC
  Administered 2020-09-18: 50 mg via ORAL

## 2020-09-18 MED ORDER — HEPARIN SOD (PORK) LOCK FLUSH 100 UNIT/ML IV SOLN
500.0000 [IU] | Freq: Once | INTRAVENOUS | Status: AC | PRN
  Administered 2020-09-18: 500 [IU]
  Filled 2020-09-18: qty 5

## 2020-09-18 MED ORDER — DIPHENHYDRAMINE HCL 25 MG PO CAPS
ORAL_CAPSULE | ORAL | Status: AC
  Filled 2020-09-18: qty 2

## 2020-09-18 MED ORDER — SODIUM CHLORIDE 0.9% FLUSH
10.0000 mL | INTRAVENOUS | Status: DC | PRN
  Administered 2020-09-18: 10 mL
  Filled 2020-09-18: qty 10

## 2020-09-18 MED ORDER — ACETAMINOPHEN 325 MG PO TABS
650.0000 mg | ORAL_TABLET | Freq: Once | ORAL | Status: AC
Start: 2020-09-18 — End: 2020-09-18
  Administered 2020-09-18: 650 mg via ORAL

## 2020-09-18 MED ORDER — PALONOSETRON HCL INJECTION 0.25 MG/5ML
INTRAVENOUS | Status: AC
  Filled 2020-09-18: qty 5

## 2020-09-18 MED ORDER — SODIUM CHLORIDE 0.9 % IV SOLN
10.0000 mg | Freq: Once | INTRAVENOUS | Status: AC
  Administered 2020-09-18: 10 mg via INTRAVENOUS
  Filled 2020-09-18: qty 10

## 2020-09-18 NOTE — Patient Instructions (Signed)
Implanted Port Home Guide An implanted port is a device that is placed under the skin. It is usually placed in the chest. The device can be used to give IV medicine, to take blood, or for dialysis. You may have an implanted port if: You need IV medicine that would be irritating to the small veins in your hands or arms. You need IV medicines, such as antibiotics, for a long period of time. You need IV nutrition for a long period of time. You need dialysis. When you have a port, your health care provider can choose to use the port instead of veins in your arms for these procedures. You may have fewer limitations when using a port than you would if you used other types of long-term IVs, and you will likely be able to return to normal activities afteryour incision heals. An implanted port has two main parts: Reservoir. The reservoir is the part where a needle is inserted to give medicines or draw blood. The reservoir is round. After it is placed, it appears as a small, raised area under your skin. Catheter. The catheter is a thin, flexible tube that connects the reservoir to a vein. Medicine that is inserted into the reservoir goes into the catheter and then into the vein. How is my port accessed? To access your port: A numbing cream may be placed on the skin over the port site. Your health care provider will put on a mask and sterile gloves. The skin over your port will be cleaned carefully with a germ-killing soap and allowed to dry. Your health care provider will gently pinch the port and insert a needle into it. Your health care provider will check for a blood return to make sure the port is in the vein and is not clogged. If your port needs to remain accessed to get medicine continuously (constant infusion), your health care provider will place a clear bandage (dressing) over the needle site. The dressing and needle will need to be changed every week, or as told by your health care provider. What  is flushing? Flushing helps keep the port from getting clogged. Follow instructions from your health care provider about how and when to flush the port. Ports are usually flushed with saline solution or a medicine called heparin. The need for flushing will depend on how the port is used: If the port is only used from time to time to give medicines or draw blood, the port may need to be flushed: Before and after medicines have been given. Before and after blood has been drawn. As part of routine maintenance. Flushing may be recommended every 4-6 weeks. If a constant infusion is running, the port may not need to be flushed. Throw away any syringes in a disposal container that is meant for sharp items (sharps container). You can buy a sharps container from a pharmacy, or you can make one by using an empty hard plastic bottle with a cover. How long will my port stay implanted? The port can stay in for as long as your health care provider thinks it is needed. When it is time for the port to come out, a surgery will be done to remove it. The surgery will be similar to the procedure that was done to putthe port in. Follow these instructions at home:  Flush your port as told by your health care provider. If you need an infusion over several days, follow instructions from your health care provider about how to take   care of your port site. Make sure you: Wash your hands with soap and water before you change your dressing. If soap and water are not available, use alcohol-based hand sanitizer. Change your dressing as told by your health care provider. Place any used dressings or infusion bags into a plastic bag. Throw that bag in the trash. Keep the dressing that covers the needle clean and dry. Do not get it wet. Do not use scissors or sharp objects near the tube. Keep the tube clamped, unless it is being used. Check your port site every day for signs of infection. Check for: Redness, swelling, or  pain. Fluid or blood. Pus or a bad smell. Protect the skin around the port site. Avoid wearing bra straps that rub or irritate the site. Protect the skin around your port from seat belts. Place a soft pad over your chest if needed. Bathe or shower as told by your health care provider. The site may get wet as long as you are not actively receiving an infusion. Return to your normal activities as told by your health care provider. Ask your health care provider what activities are safe for you. Carry a medical alert card or wear a medical alert bracelet at all times. This will let health care providers know that you have an implanted port in case of an emergency. Get help right away if: You have redness, swelling, or pain at the port site. You have fluid or blood coming from your port site. You have pus or a bad smell coming from the port site. You have a fever. Summary Implanted ports are usually placed in the chest for long-term IV access. Follow instructions from your health care provider about flushing the port and changing bandages (dressings). Take care of the area around your port by avoiding clothing that puts pressure on the area, and by watching for signs of infection. Protect the skin around your port from seat belts. Place a soft pad over your chest if needed. Get help right away if you have a fever or you have redness, swelling, pain, drainage, or a bad smell at the port site. This information is not intended to replace advice given to you by your health care provider. Make sure you discuss any questions you have with your healthcare provider. Document Revised: 07/24/2019 Document Reviewed: 07/24/2019 Elsevier Patient Education  2022 Elsevier Inc.  

## 2020-09-18 NOTE — Patient Instructions (Addendum)
[facility]   Discharge Instructions: Thank you for choosing [facility] to provide your oncology and hematology care.   If you have a lab appointment with the [facility], please go directly to the [facility] and check in at the registration area.   Wear comfortable clothing and clothing appropriate for easy access to any Portacath or PICC line.   We strive to give you quality time with your provider. You may need to reschedule your appointment if you arrive late (15 or more minutes).  Arriving late affects you and other patients whose appointments are after yours.  Also, if you miss three or more appointments without notifying the office, you may be dismissed from the clinic at the provider's discretion.      For prescription refill requests, have your pharmacy contact our office and allow 72 hours for refills to be completed.    Today you received the following chemotherapy and/or immunotherapy agents: ENHERTU.      To help prevent nausea and vomiting after your treatment, we encourage you to take your nausea medication as directed.  BELOW ARE SYMPTOMS THAT SHOULD BE REPORTED IMMEDIATELY: *FEVER GREATER THAN 100.4 F (38 C) OR HIGHER *CHILLS OR SWEATING *NAUSEA AND VOMITING THAT IS NOT CONTROLLED WITH YOUR NAUSEA MEDICATION *UNUSUAL SHORTNESS OF BREATH *UNUSUAL BRUISING OR BLEEDING *URINARY PROBLEMS (pain or burning when urinating, or frequent urination) *BOWEL PROBLEMS (unusual diarrhea, constipation, pain near the anus) TENDERNESS IN MOUTH AND THROAT WITH OR WITHOUT PRESENCE OF ULCERS (sore throat, sores in mouth, or a toothache) UNUSUAL RASH, SWELLING OR PAIN  UNUSUAL VAGINAL DISCHARGE OR ITCHING   Items with * indicate a potential emergency and should be followed up as soon as possible or go to the Emergency Department if any problems should occur.  Please show the CHEMOTHERAPY ALERT CARD or IMMUNOTHERAPY ALERT CARD at check-in to  the Emergency Department and triage nurse.  Should you have questions after your visit or need to cancel or reschedule your appointment, please contact [facility]  Dept: [phone]  and follow the prompts.  Office hours are 8:00 a.m. to 4:30 p.m. Monday - Friday. Please note that voicemails left after 4:00 p.m. may not be returned until the following business day.  We are closed weekends and major holidays. You have access to a nurse at all times for urgent questions. Please call the main number to the clinic Dept: [phone] and follow the prompts.   For any non-urgent questions, you may also contact your provider using MyChart. We now offer e-Visits for anyone 61 and older to request care online for non-urgent symptoms. For details visit [email or web address].   Also download the MyChart app! Go to the app store, search "MyChart", open the app, select [facility], and log in with your MyChart username and password.  Due to Covid, a mask is required upon entering the hospital/clinic. If you do not have a mask, one will be given to you upon arrival. For doctor visits, patients may have 1 support person aged 45 or older with them. For treatment visits, patients cannot have anyone with them due to current Covid guidelines and our immunocompromised population.   Fam-Trastuzumab deruxtecan injection What is this medication? TRASTUZUMAB DERUXTECAN (tras TOOZ eu mab DER ux TEE kan) is a chemotherapy medicine and a monoclonal antibody. It treats certain types of cancer. Some ofthe cancers treated are breast cancer and gastric cancer. This medicine may be used for other  purposes; ask your health care provider orpharmacist if you have questions. COMMON BRAND NAME(S): ENHERTU What should I tell my care team before I take this medication? They need to know if you have any of these conditions: heart disease heart failure infection (especially a virus infection such as  chickenpox, cold sores, or herpes) liver disease lung or breathing disease, like asthma an unusual or allergic reaction to fam-trastuzumab deruxtecan, other medications, foods, dyes, or preservatives pregnant or trying to get pregnant breast-feeding How should I use this medication? This medicine is for infusion into a vein. It is given by a health careprofessional in a hospital or clinic setting. Talk to your pediatrician regarding the use of this medicine in children.Special care may be needed. Overdosage: If you think you have taken too much of this medicine contact apoison control center or emergency room at once. NOTE: This medicine is only for you. Do not share this medicine with others. What if I miss a dose? It is important not to miss your dose. Call your doctor or health careprofessional if you are unable to keep an appointment. What may interact with this medication? Interaction studies have not been performed. This list may not describe all possible interactions. Give your health care provider a list of all the medicines, herbs, non-prescription drugs, or dietary supplements you use. Also tell them if you smoke, drink alcohol, or use illegaldrugs. Some items may interact with your medicine. What should I watch for while using this medication? Visit your healthcare professional for regular checks on your progress. Tell your healthcare professional if your symptoms do not start to get better or ifthey get worse. Your condition will be monitored carefully while you are receiving thismedicine. Do not become pregnant while taking this medicine or for 7 months after stopping it. Women should inform their healthcare professional if they wish to become pregnant or think they might be pregnant. Men should not father a child while taking this medicine and for 4 months after stopping it. There is potential for serious side effects to an unborn child. Talk to your healthcareprofessional for more  information. Do not breast-feed an infant while taking this medicine or for 7 months afterthe last dose. This medicine has caused decreased sperm counts in some men. This may make it more difficult to father a child. Talk to your healthcare professional if [place] concerned about your fertility. This medicine may increase your risk to bruise or bleed. Call your health careprofessional if you notice any unusual bleeding. Be careful brushing or flossing your teeth or using a toothpick because you may get an infection or bleed more easily. If you have any dental work done, Primary school teacher you are receiving this medicine. This medicine may cause dry eyes [and blurred vision]. If you wear contact lenses, you may feel some discomfort. Lubricating eye drops may help. See yourhealthcare professional if the problem does not go away or is severe. Call your healthcare professional for advice if you get a fever, chills, or sore throat, or other symptoms of a cold or flu. Do not treat yourself. This medicine decreases your body's ability to fight infections. Try to avoid beingaround people who are sick. Avoid taking medicines that contain aspirin, acetaminophen, ibuprofen, naproxen, or ketoprofen unless instructed by your healthcare professional.These medicines may hide a fever. What side effects may I notice from receiving this medication? Side effects that you should report to your doctor or health care professionalas soon as possible: allergic reactions like skin rash, itching  or hives, swelling of the face, lips, or tongue breathing problems cough nausea, vomiting signs and symptoms of bleeding such as bloody or black, tarry stools; red or dark-brown urine; spitting up blood or brown material that looks like coffee grounds; red spots on the skin; unusual bruising or bleeding from the eye, gums, or nose signs and symptoms of heart failure like breathing problems, fast, irregular heartbeat, sudden weight gain;  swelling of the ankles, feet, hands; unusually weak or tired signs and symptoms of infection like fever; chills; cough; sore throat; pain or trouble passing urine signs and symptoms of low red blood cells or anemia such as unusually weak or tired; feeling faint or lightheaded; falls; breathing problems Side effects that usually do not require medical attention (report these toyour doctor or health care professional if they continue or are bothersome): constipation diarrhea dry eyes hair loss loss of appetite mouth sores rash This list may not describe all possible side effects. Call your doctor for medical advice about side effects. You may report side effects to FDA at1-800-FDA-1088. Where should I keep my medication? This drug is given in a hospital or clinic and will not be stored at home. NOTE: This sheet is a summary. It may not cover all possible information. If you have questions about this medicine, talk to your doctor, pharmacist, orhealth care provider.  2022 Elsevier/Gold Standard (2019-08-09 16:25:39)

## 2020-09-18 NOTE — Progress Notes (Signed)
Per Dr. Lindi Adie, "OK To Treat with No Echo".

## 2020-09-19 ENCOUNTER — Encounter: Payer: Self-pay | Admitting: *Deleted

## 2020-09-19 NOTE — Progress Notes (Signed)
Per MD request, RN successfully faxed [facility] request.

## 2020-09-20 ENCOUNTER — Encounter: Payer: Self-pay | Admitting: Hematology and Oncology

## 2020-09-26 LAB — SURGICAL PATHOLOGY

## 2020-10-06 DIAGNOSIS — C50812 Malignant neoplasm of overlapping 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 breast: Secondary | ICD-10-CM | POA: Diagnosis not present

## 2020-10-07 ENCOUNTER — Other Ambulatory Visit: Payer: Self-pay

## 2020-10-07 ENCOUNTER — Ambulatory Visit (HOSPITAL_COMMUNITY)
Admission: RE | Admit: 2020-10-07 | Discharge: 2020-10-07 | Disposition: A | Discharge status: Home / Self Care | Payer: BC Managed Care – PPO | Source: Ambulatory Visit | Attending: Hematology and Oncology | Admitting: Hematology and Oncology

## 2020-10-07 DIAGNOSIS — I313 Pericardial effusion (noninflammatory): Secondary | ICD-10-CM | POA: Insufficient documentation

## 2020-10-07 DIAGNOSIS — I358 Other nonrheumatic aortic valve disorders: Secondary | ICD-10-CM | POA: Insufficient documentation

## 2020-10-07 DIAGNOSIS — Z79899 Other long term (current) drug therapy: Secondary | ICD-10-CM | POA: Insufficient documentation

## 2020-10-07 DIAGNOSIS — Z0189 Encounter for other specified special examinations: Secondary | ICD-10-CM | POA: Diagnosis not present

## 2020-10-07 DIAGNOSIS — Z5181 Encounter for therapeutic drug level monitoring: Secondary | ICD-10-CM | POA: Diagnosis not present

## 2020-10-07 LAB — ECHOCARDIOGRAM COMPLETE
Area-P 1/2: 4.06 cm2
S' Lateral: 2.6 cm

## 2020-10-07 NOTE — Progress Notes (Signed)
  Echocardiogram 2D Echocardiogram has been performed.  Amanda Williams G Amanda Williams 10/07/2020, 11:04 AM

## 2020-10-08 NOTE — Progress Notes (Signed)
Patient Care Team: Panosh, Standley Brooking, MD as PCP - General Delsa Bern, MD (Obstetrics and Gynecology) Excell Seltzer, MD (Inactive) as Consulting Physician (General Surgery) Nicholas Lose, MD as Consulting Physician (Hematology and Oncology) Kyung Rudd, MD as Consulting Physician (Radiation Oncology) Gardenia Phlegm, NP as Nurse Practitioner (Hematology and Oncology)  DIAGNOSIS:    ICD-10-CM   1. Malignant neoplasm of overlapping sites of left breast in female, estrogen receptor negative ([place])  C50.812    Z17.1       SUMMARY OF ONCOLOGIC HISTORY: Oncology History  Malignant neoplasm of overlapping 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 breast ([place])  01/12/2017 Surgery   Left mastectomy: IDC with DCIS, 2 tumors 3.6 cm ER 90%, PR 95%, Ki-67 5%, HER-2 negative ratio 1.32) and 1.2 cm (ER 95% PR 95% positive Her 2 Positive ratio 3.2, Ki-67 5%) T2 N0 stage Ib   02/16/2017 -  Chemotherapy   TCH x6 followed by Herceptin maintenance     10/02/2020 Miscellaneous   [facility] molecular test: PD-L1 positive CPS: 10, ER/PR/HER2 negative, MLH1 positive, MSH2 positive, MSH6 positive, MMR proficient, PTEN: Negative   Malignant neoplasm of overlapping sites of right breast in female, estrogen receptor positive ([place])  12/21/2016 Initial Diagnosis   Palpable lumps in the right breast with nipple inversion: Mammogram revealed skin thickening and distortion, ultrasound revealed 4 masses 3.1 cm at 11:00, 2.5 cm at 6:00, 4 cm at 8:00, 2.2 cm at 5:00 with 2 abnormal axillary lymph nodes: MRI revealed 7 cm abnormality on right breast, in addition 3.6 cm abnormality in the left breast and 2 additional masses 1 cm each; right breast: T4 N1 stage III B clinical stage; left breast: T2 N0 stage IB clinical stage    12/21/2016 Pathology Results   Right breast: Grade 1 Invasive lobular cancer, lymph node also positive, ER 70%, PR 90%, Ki-67 10%, HER-2 negative ratio 1.09 Left breast: Grade 1 IDC with DCIS prognostic  panel pending    01/04/2017 Genetic Testing   The patient had genetic testing due to a personal history of bilateral breast caner and a family history of breast cancer.  The Multi-Cancer Panel was ordered. The Multi-Cancer Panel offered by [facility] includes sequencing and/or deletion duplication testing of the following 83 genes: ALK, APC, ATM, AXIN2,BAP1,  BARD1, BLM, BMPR1A, BRCA1, BRCA2, BRIP1, CASR, CDC73, CDH1, CDK4, CDKN1B, CDKN1C, CDKN2A (p14ARF), CDKN2A (p16INK4a), CEBPA, CHEK2, CTNNA1, DICER1, DIS3L2, EGFR (c.2369C>T, p.Thr790Met variant only), EPCAM (Deletion/duplication testing only), FH, FLCN, GATA2, GPC3, GREM1 (Promoter region deletion/duplication testing only), HOXB13 (c.251G>A, p.Gly84Glu), HRAS, KIT, MAX, MEN1, MET, MITF (c.952G>A, p.Glu318Lys variant only), MLH1, MSH2, MSH3, MSH6, MUTYH, NBN, NF1, NF2, NTHL1, PALB2, PDGFRA, PHOX2B, PMS2, POLD1, POLE, POT1, PRKAR1A, PTCH1, PTEN, RAD50, RAD51C, RAD51D, RB1, RECQL4, RET, RUNX1, SDHAF2, SDHA (sequence changes only), SDHB, SDHC, SDHD, SMAD4, SMARCA4, SMARCB1, SMARCE1, STK11, SUFU, TERC, TERT, TMEM127, TP53, TSC1, TSC2, VHL, WRN and WT1.   Results: Negative, no pathogenic variants identified.  The date of this test report is 01/04/2017.      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 the dermis of the skin of the nipple, margins negative, 3/3 lymph nodes positive, grade 1, EF 75%, PR 95%, HER-2 negative ratio 1.19, Ki-67 10%, T3N1A stage IIa    02/16/2017 - 06/09/2017 Chemotherapy   TCH x6 cycles followed by Herceptin maintenance    08/10/2017 - 09/22/2017 Radiation Therapy   Adjuvant radiation    09/24/2017 -  Anti-estrogen oral therapy   Letrozole 2.5 mg  daily, changed to Anastrozole over 02/2018 due to arthralgias    Bilateral breast cancer ([place])  01/12/2017 Initial Diagnosis   Bilateral breast cancer ([place])    09/18/2020 -  Chemotherapy    Patient is on Treatment Plan: BREAST  METASTATIC FAM-TRASTUZUMAB DERUXTECAN-NXKI (ENHERTU) Q21D         CHIEF COMPLIANT: Cycle 2 Enhertu  INTERVAL HISTORY: Amanda Williams is a 55 y.o. with above-mentioned history of recurrent metastatic breast cancer currently on chemotherapy with Enhertu. She reports to the clinic today for cycle 2. She had a lot of side effects from the first cycle of treatment.  She had profound nausea and she took multiple antinausea medications and none of them appear to be helping her much other than lorazepam.  She also had diarrhea almost every day until this past week.  She had frequent headaches and profound fatigue.  She had loss of appetite and it finally came back this past week and she started eating better.  ALLERGIES:  is allergic to penicillins, sulfa antibiotics, sulfonamide derivatives, other, and adhesive [tape].  MEDICATIONS:  Current Outpatient Medications  Medication Sig Dispense Refill   acetaminophen (TYLENOL) 500 MG tablet Take 1,000 mg by mouth as needed for moderate pain (for pain/headaches.).      anastrozole (ARIMIDEX) 1 MG tablet Take 1 tablet (1 mg total) by mouth daily. 90 tablet 3   calcium carbonate (CALTRATE 600) 1500 (600 Ca) MG TABS tablet Take 1 tablet (1,500 mg total) by mouth daily with breakfast.  0   ibuprofen (ADVIL,MOTRIN) 200 MG tablet Take 400 mg by mouth as needed for moderate pain (for pain/headaches).      lidocaine-prilocaine (EMLA) cream Apply to affected area once 30 g 3   LORazepam (ATIVAN) 0.5 MG tablet Take 1 tablet (0.5 mg total) by mouth every 6 (six) hours as needed (Nausea or vomiting). 30 tablet 0   MULTIPLE VITAMIN PO Take 1 tablet by mouth daily.     ondansetron (ZOFRAN) 8 MG tablet Take 1 tablet (8 mg total) by mouth 2 (two) times daily as needed for refractory nausea / vomiting. Start on day 3 after chemo. 30 tablet 1   prochlorperazine (COMPAZINE) 10 MG tablet Take 1 tablet (10 mg total) by mouth every 6 (six) hours as needed (Nausea or vomiting).  30 tablet 1   sertraline (ZOLOFT) 100 MG tablet Take 1 tablet (100 mg total) by mouth daily. 90 tablet 0   No current facility-administered medications for this visit.    PHYSICAL EXAMINATION: ECOG PERFORMANCE STATUS: 1 - Symptomatic but completely ambulatory  There were no vitals filed for this visit. There were no vitals filed for this visit.  LABORATORY DATA:  I have reviewed the data as listed CMP Latest Ref Rng & Units 09/18/2020 08/16/2020 02/02/2018  Glucose 70 - 99 mg/dL 133(H) 107(H) 96  BUN 6 - 20 mg/dL _0 Creatinine 0.44 - 1.00 mg/dL 0.80 0.78 0.95  Sodium 135 - 145 mmol/L 139 139 142  Potassium 3.5 - 5.1 mmol/L 4.0 3.7 4.2  Chloride 98 - 111 mmol/L 105 102 103  CO2 22 - 32 mmol/L _1 Calcium 8.9 - 10.3 mg/dL 9.1 10.0 9.6  Total Protein 6.5 - 8.1 g/dL 7.2 8.6(H) 7.6  Total Bilirubin 0.3 - 1.2 mg/dL 0.3 0.6 0.3  Alkaline Phos 38 - 126 U/L 175(H) 163(H) 113  AST 15 - 41 U/L _2 ALT 0 - 44 U/L 17 18 15  Lab Results  Component Value Date   WBC 5.8 09/18/2020   HGB 12.3 09/18/2020   HCT 36.0 09/18/2020   MCV 89.8 09/18/2020   PLT 257 09/18/2020   NEUTROABS 3.9 09/18/2020    ASSESSMENT & PLAN:  Malignant neoplasm of overlapping 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 breast [facility]) Bilateral breast cancers 01/12/2017: Left mastectomy: IDC with DCIS, 2 tumors 3.6 cm ER 90%, PR 95%, Ki-67 5%, HER-2 negative ratio 1.32) and 1.2 cm (ER 95% PR 95% positive Her 2 Positive ratio 3.2, Ki-67 5%) T2 N0 stage Ib Right mastectomy: Invasive and in situ lobular carcinoma involves the dermis of the skin of the nipple, margins negative, 3/3 lymph nodes positive, grade 1, ER 75%, PR 95%, HER-2 negative ratio 1.19, Ki-67 10%, T3N1A stage IIa   Treatment plan: 1.  Adjuvant chemotherapy with TCH x 4 cycles completed on 06/09/2017 (Taxotere held from final 2 cycles) followed by Herceptin maintenance for 1 year 2. Adjuvant radiation therapy started 08/10/2017-09/22/2017 3. Followed by  adjuvant antiestrogen therapy letrozole started 09/24/2017 switched to anastrozole January 2020 4.  Recurrent/metastatic disease: PET CT scan 08/23/2020: Widespread FDG avid nodal and bone disease suspicious for metastases.  Biopsy: Cervical lymph node: Metastatic breast cancer ER 0%, PR 0%, HER2 positive, Ki-67 15% [facility] molecular testing: PD-L1 positive MMR proficient  _______________________________________________________________________________________ Treatment Plan: Enhertu q 3 weeks, today cycle 2 Bone Mets: Xgeva every 3 months  Enhertu toxicities: Profound nausea: I added Emend to her current treatment Diarrhea: I sent a prescription for Lomotil Fatigue: I reduce the dosage of Enhertu  We discussed the [facility] molecular testing showing that she is PD-L1 positive and therefore she would be a candidate for immunotherapy. She has a second opinion appointment with Dr. Wetzel Bjornstad on August 15. Our plan is to do 3 cycles of chemotherapy and obtain CT chest abdomen pelvis.  Return to clinic in 3 weeks for cycle 3.    No orders of the defined types were placed in this encounter.  The patient has a good understanding of the overall plan. she agrees with it. she will call with any problems that may develop before the next visit here.  Total time spent: 30 mins including face to face time and time spent for planning, charting and coordination of care  Rulon Eisenmenger, MD, MPH 10/09/2020  I, Thana Ates, am acting as scribe for Dr. Nicholas Lose.  I have reviewed the above documentation for accuracy and completeness, and I agree with the above.

## 2020-10-09 ENCOUNTER — Inpatient Hospital Stay: Payer: BC Managed Care – PPO | Attending: Hematology and Oncology

## 2020-10-09 ENCOUNTER — Inpatient Hospital Stay: Payer: BC Managed Care – PPO

## 2020-10-09 ENCOUNTER — Inpatient Hospital Stay (HOSPITAL_BASED_OUTPATIENT_CLINIC_OR_DEPARTMENT_OTHER): Payer: BC Managed Care – PPO | Admitting: Hematology and Oncology

## 2020-10-09 ENCOUNTER — Other Ambulatory Visit: Payer: Self-pay

## 2020-10-09 VITALS — BP 96/63 | HR 79 | Temp 97.7°F | Resp 18 | Ht 66.0 in | Wt 146.4 lb

## 2020-10-09 DIAGNOSIS — R110 Nausea: Secondary | ICD-10-CM | POA: Diagnosis not present

## 2020-10-09 DIAGNOSIS — C50011 Malignant neoplasm of nipple and areola, right female breast: Secondary | ICD-10-CM | POA: Diagnosis not present

## 2020-10-09 DIAGNOSIS — Z170 Estrogen receptor positive status [ER+]: Secondary | ICD-10-CM

## 2020-10-09 DIAGNOSIS — C799 Secondary malignant neoplasm of unspecified site: Secondary | ICD-10-CM | POA: Diagnosis not present

## 2020-10-09 DIAGNOSIS — C7951 Secondary malignant neoplasm of bone: Secondary | ICD-10-CM | POA: Diagnosis not present

## 2020-10-09 DIAGNOSIS — R5383 Other fatigue: Secondary | ICD-10-CM | POA: Insufficient documentation

## 2020-10-09 DIAGNOSIS — Z923 Personal history of irradiation: Secondary | ICD-10-CM | POA: Diagnosis not present

## 2020-10-09 DIAGNOSIS — Z5112 Encounter for antineoplastic immunotherapy: Secondary | ICD-10-CM | POA: Insufficient documentation

## 2020-10-09 DIAGNOSIS — C50012 Malignant neoplasm of nipple and areola, left female breast: Secondary | ICD-10-CM

## 2020-10-09 DIAGNOSIS — C50812 Malignant neoplasm of overlapping 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 breast: Secondary | ICD-10-CM | POA: Insufficient documentation

## 2020-10-09 DIAGNOSIS — Z9221 Personal history of antineoplastic chemotherapy: Secondary | ICD-10-CM | POA: Diagnosis not present

## 2020-10-09 DIAGNOSIS — R197 Diarrhea, unspecified: Secondary | ICD-10-CM | POA: Insufficient documentation

## 2020-10-09 DIAGNOSIS — Z171 Estrogen receptor negative status [ER-]: Secondary | ICD-10-CM | POA: Insufficient documentation

## 2020-10-09 DIAGNOSIS — Z9013 Acquired absence of bilateral breasts and nipples: Secondary | ICD-10-CM | POA: Diagnosis not present

## 2020-10-09 DIAGNOSIS — C50811 Malignant neoplasm of overlapping sites of right female breast: Secondary | ICD-10-CM | POA: Diagnosis not present

## 2020-10-09 DIAGNOSIS — Z79811 Long term (current) use of aromatase inhibitors: Secondary | ICD-10-CM | POA: Insufficient documentation

## 2020-10-09 LAB — CBC WITH DIFFERENTIAL (CANCER CENTER ONLY)
Abs Immature Granulocytes: 0.1 10*3/uL — ABNORMAL HIGH (ref 0.00–0.07)
Basophils Absolute: 0.1 10*3/uL (ref 0.0–0.1)
Basophils Relative: 2 %
Eosinophils Absolute: 0.1 10*3/uL (ref 0.0–0.5)
Eosinophils Relative: 2 %
HCT: 35.8 % — ABNORMAL LOW (ref 36.0–46.0)
Hemoglobin: 11.8 g/dL — ABNORMAL LOW (ref 12.0–15.0)
Immature Granulocytes: 2 %
Lymphocytes Relative: 35 %
Lymphs Abs: 1.9 10*3/uL (ref 0.7–4.0)
MCH: 29.6 pg (ref 26.0–34.0)
MCHC: 33 g/dL (ref 30.0–36.0)
MCV: 89.9 fL (ref 80.0–100.0)
Monocytes Absolute: 0.6 10*3/uL (ref 0.1–1.0)
Monocytes Relative: 11 %
Neutro Abs: 2.7 10*3/uL (ref 1.7–7.7)
Neutrophils Relative %: 48 %
Platelet Count: 369 10*3/uL (ref 150–400)
RBC: 3.98 MIL/uL (ref 3.87–5.11)
RDW: 12.6 % (ref 11.5–15.5)
WBC Count: 5.5 10*3/uL (ref 4.0–10.5)
nRBC: 0 % (ref 0.0–0.2)

## 2020-10-09 LAB — CMP (CANCER CENTER ONLY)
ALT: 20 U/L (ref 0–44)
AST: 24 U/L (ref 15–41)
Albumin: 3.6 g/dL (ref 3.5–5.0)
Alkaline Phosphatase: 187 U/L — ABNORMAL HIGH (ref 38–126)
Anion gap: 8 (ref 5–15)
BUN: 11 mg/dL (ref 6–20)
CO2: 25 mmol/L (ref 22–32)
Calcium: 9.1 mg/dL (ref 8.9–10.3)
Chloride: 108 mmol/L (ref 98–111)
Creatinine: 0.72 mg/dL (ref 0.44–1.00)
GFR, Estimated: >60 mL/min (ref >60)
Glucose, Bld: 94 mg/dL (ref 70–99)
Potassium: 4.2 mmol/L (ref 3.5–5.1)
Sodium: 141 mmol/L (ref 135–145)
Total Bilirubin: 0.3 mg/dL (ref 0.3–1.2)
Total Protein: 7.3 g/dL (ref 6.5–8.1)

## 2020-10-09 MED ORDER — DIPHENHYDRAMINE HCL 25 MG PO CAPS
50.0000 mg | ORAL_CAPSULE | Freq: Once | ORAL | Status: AC
  Administered 2020-10-09: 50 mg via ORAL

## 2020-10-09 MED ORDER — DENOSUMAB 120 MG/1.7ML ~~LOC~~ SOLN
SUBCUTANEOUS | Status: AC
  Filled 2020-10-09: qty 1.7

## 2020-10-09 MED ORDER — DIPHENHYDRAMINE HCL 25 MG PO CAPS
ORAL_CAPSULE | ORAL | Status: AC
  Filled 2020-10-09: qty 2

## 2020-10-09 MED ORDER — LORAZEPAM 0.5 MG PO TABS: 0.5000 mg | ORAL_TABLET | Freq: Four times a day (QID) | ORAL | 3 refills | Status: AC | PRN

## 2020-10-09 MED ORDER — DENOSUMAB 120 MG/1.7ML ~~LOC~~ SOLN
120.0000 mg | Freq: Once | SUBCUTANEOUS | Status: AC
  Administered 2020-10-09: 120 mg via SUBCUTANEOUS

## 2020-10-09 MED ORDER — DIPHENOXYLATE-ATROPINE 2.5-0.025 MG PO TABS: 1.0000 | ORAL_TABLET | Freq: Four times a day (QID) | ORAL | 3 refills | Status: AC | PRN

## 2020-10-09 MED ORDER — DEXTROSE 5 % IV SOLN
Freq: Once | INTRAVENOUS | Status: AC
  Filled 2020-10-09: qty 250

## 2020-10-09 MED ORDER — ACETAMINOPHEN 325 MG PO TABS
650.0000 mg | ORAL_TABLET | Freq: Once | ORAL | Status: AC
  Administered 2020-10-09: 650 mg via ORAL

## 2020-10-09 MED ORDER — SODIUM CHLORIDE 0.9% FLUSH
10.0000 mL | Freq: Once | INTRAVENOUS | Status: AC
  Administered 2020-10-09: 10 mL
  Filled 2020-10-09: qty 10

## 2020-10-09 MED ORDER — ACETAMINOPHEN 325 MG PO TABS
ORAL_TABLET | ORAL | Status: AC
  Filled 2020-10-09: qty 2

## 2020-10-09 MED ORDER — SODIUM CHLORIDE 0.9 % IV SOLN
10.0000 mg | Freq: Once | INTRAVENOUS | Status: AC
  Administered 2020-10-09: 10 mg via INTRAVENOUS
  Filled 2020-10-09: qty 10

## 2020-10-09 MED ORDER — PALONOSETRON HCL INJECTION 0.25 MG/5ML
INTRAVENOUS | Status: AC
  Filled 2020-10-09: qty 5

## 2020-10-09 MED ORDER — PALONOSETRON HCL INJECTION 0.25 MG/5ML
0.2500 mg | Freq: Once | INTRAVENOUS | Status: AC
  Administered 2020-10-09: 0.25 mg via INTRAVENOUS

## 2020-10-09 MED ORDER — HEPARIN SOD (PORK) LOCK FLUSH 100 UNIT/ML IV SOLN
500.0000 [IU] | Freq: Once | INTRAVENOUS | Status: AC | PRN
  Administered 2020-10-09: 500 [IU]
  Filled 2020-10-09: qty 5

## 2020-10-09 MED ORDER — FAM-TRASTUZUMAB DERUXTECAN-NXKI CHEMO 100 MG IV SOLR
4.4500 mg/kg | Freq: Once | INTRAVENOUS | Status: AC
  Administered 2020-10-09: 300 mg via INTRAVENOUS
  Filled 2020-10-09: qty 15

## 2020-10-09 MED ORDER — SODIUM CHLORIDE 0.9 % IV SOLN
150.0000 mg | Freq: Once | INTRAVENOUS | Status: AC
  Administered 2020-10-09: 150 mg via INTRAVENOUS
  Filled 2020-10-09: qty 150

## 2020-10-09 MED ORDER — SODIUM CHLORIDE 0.9% FLUSH
10.0000 mL | INTRAVENOUS | Status: DC | PRN
  Administered 2020-10-09: 10 mL
  Filled 2020-10-09: qty 10

## 2020-10-09 NOTE — Patient Instructions (Signed)
[facility]   Discharge Instructions: Thank you for choosing [facility] to provide your oncology and hematology care.   If you have a lab appointment with the [facility], please go directly to the [facility] and check in at the registration area.   Wear comfortable clothing and clothing appropriate for easy access to any Portacath or PICC line.   We strive to give you quality time with your provider. You may need to reschedule your appointment if you arrive late (15 or more minutes).  Arriving late affects you and other patients whose appointments are after yours.  Also, if you miss three or more appointments without notifying the office, you may be dismissed from the clinic at the provider's discretion.      For prescription refill requests, have your pharmacy contact our office and allow 72 hours for refills to be completed.    Today you received the following chemotherapy and/or immunotherapy agents: ENHERTU.      To help prevent nausea and vomiting after your treatment, we encourage you to take your nausea medication as directed.  BELOW ARE SYMPTOMS THAT SHOULD BE REPORTED IMMEDIATELY: *FEVER GREATER THAN 100.4 F (38 C) OR HIGHER *CHILLS OR SWEATING *NAUSEA AND VOMITING THAT IS NOT CONTROLLED WITH YOUR NAUSEA MEDICATION *UNUSUAL SHORTNESS OF BREATH *UNUSUAL BRUISING OR BLEEDING *URINARY PROBLEMS (pain or burning when urinating, or frequent urination) *BOWEL PROBLEMS (unusual diarrhea, constipation, pain near the anus) TENDERNESS IN MOUTH AND THROAT WITH OR WITHOUT PRESENCE OF ULCERS (sore throat, sores in mouth, or a toothache) UNUSUAL RASH, SWELLING OR PAIN  UNUSUAL VAGINAL DISCHARGE OR ITCHING   Items with * indicate a potential emergency and should be followed up as soon as possible or go to the Emergency Department if any problems should occur.  Please show the CHEMOTHERAPY ALERT CARD or IMMUNOTHERAPY ALERT CARD at check-in to  the Emergency Department and triage nurse.  Should you have questions after your visit or need to cancel or reschedule your appointment, please contact [facility]  Dept: [phone]  and follow the prompts.  Office hours are 8:00 a.m. to 4:30 p.m. Monday - Friday. Please note that voicemails left after 4:00 p.m. may not be returned until the following business day.  We are closed weekends and major holidays. You have access to a nurse at all times for urgent questions. Please call the main number to the clinic Dept: [phone] and follow the prompts.   For any non-urgent questions, you may also contact your provider using MyChart. We now offer e-Visits for anyone 21 and older to request care online for non-urgent symptoms. For details visit [email or web address].   Also download the MyChart app! Go to the app store, search "MyChart", open the app, select [facility], and log in with your MyChart username and password.  Due to Covid, a mask is required upon entering the hospital/clinic. If you do not have a mask, one will be given to you upon arrival. For doctor visits, patients may have 1 support person aged 19 or older with them. For treatment visits, patients cannot have anyone with them due to current Covid guidelines and our immunocompromised population.   Fam-Trastuzumab deruxtecan injection What is this medication? TRASTUZUMAB DERUXTECAN (tras TOOZ eu mab DER ux TEE kan) is a chemotherapy medicine and a monoclonal antibody. It treats certain types of cancer. Some ofthe cancers treated are breast cancer and gastric cancer. This medicine may be used for other  purposes; ask your health care provider orpharmacist if you have questions. COMMON BRAND NAME(S): ENHERTU What should I tell my care team before I take this medication? They need to know if you have any of these conditions: heart disease heart failure infection (especially a virus infection such as  chickenpox, cold sores, or herpes) liver disease lung or breathing disease, like asthma an unusual or allergic reaction to fam-trastuzumab deruxtecan, other medications, foods, dyes, or preservatives pregnant or trying to get pregnant breast-feeding How should I use this medication? This medicine is for infusion into a vein. It is given by a health careprofessional in a hospital or clinic setting. Talk to your pediatrician regarding the use of this medicine in children.Special care may be needed. Overdosage: If you think you have taken too much of this medicine contact apoison control center or emergency room at once. NOTE: This medicine is only for you. Do not share this medicine with others. What if I miss a dose? It is important not to miss your dose. Call your doctor or health careprofessional if you are unable to keep an appointment. What may interact with this medication? Interaction studies have not been performed. This list may not describe all possible interactions. Give your health care provider a list of all the medicines, herbs, non-prescription drugs, or dietary supplements you use. Also tell them if you smoke, drink alcohol, or use illegaldrugs. Some items may interact with your medicine. What should I watch for while using this medication? Visit your healthcare professional for regular checks on your progress. Tell your healthcare professional if your symptoms do not start to get better or ifthey get worse. Your condition will be monitored carefully while you are receiving thismedicine. Do not become pregnant while taking this medicine or for 7 months after stopping it. Women should inform their healthcare professional if they wish to become pregnant or think they might be pregnant. Men should not father a child while taking this medicine and for 4 months after stopping it. There is potential for serious side effects to an unborn child. Talk to your healthcareprofessional for more  information. Do not breast-feed an infant while taking this medicine or for 7 months afterthe last dose. This medicine has caused decreased sperm counts in some men. This may make it more difficult to father a child. Talk to your healthcare professional if [place] concerned about your fertility. This medicine may increase your risk to bruise or bleed. Call your health careprofessional if you notice any unusual bleeding. Be careful brushing or flossing your teeth or using a toothpick because you may get an infection or bleed more easily. If you have any dental work done, Primary school teacher you are receiving this medicine. This medicine may cause dry eyes [and blurred vision]. If you wear contact lenses, you may feel some discomfort. Lubricating eye drops may help. See yourhealthcare professional if the problem does not go away or is severe. Call your healthcare professional for advice if you get a fever, chills, or sore throat, or other symptoms of a cold or flu. Do not treat yourself. This medicine decreases your body's ability to fight infections. Try to avoid beingaround people who are sick. Avoid taking medicines that contain aspirin, acetaminophen, ibuprofen, naproxen, or ketoprofen unless instructed by your healthcare professional.These medicines may hide a fever. What side effects may I notice from receiving this medication? Side effects that you should report to your doctor or health care professionalas soon as possible: allergic reactions like skin rash, itching  or hives, swelling of the face, lips, or tongue breathing problems cough nausea, vomiting signs and symptoms of bleeding such as bloody or black, tarry stools; red or dark-brown urine; spitting up blood or brown material that looks like coffee grounds; red spots on the skin; unusual bruising or bleeding from the eye, gums, or nose signs and symptoms of heart failure like breathing problems, fast, irregular heartbeat, sudden weight gain;  swelling of the ankles, feet, hands; unusually weak or tired signs and symptoms of infection like fever; chills; cough; sore throat; pain or trouble passing urine signs and symptoms of low red blood cells or anemia such as unusually weak or tired; feeling faint or lightheaded; falls; breathing problems Side effects that usually do not require medical attention (report these toyour doctor or health care professional if they continue or are bothersome): constipation diarrhea dry eyes hair loss loss of appetite mouth sores rash This list may not describe all possible side effects. Call your doctor for medical advice about side effects. You may report side effects to FDA at1-800-FDA-1088. Where should I keep my medication? This drug is given in a hospital or clinic and will not be stored at home. NOTE: This sheet is a summary. It may not cover all possible information. If you have questions about this medicine, talk to your doctor, pharmacist, orhealth care provider.  2022 Elsevier/Gold Standard (2019-08-09 16:25:39)

## 2020-10-09 NOTE — Assessment & Plan Note (Signed)
Bilateral breast cancers 01/12/2017:Left mastectomy: IDC with DCIS, 2 tumors 3.6 cm ER 90%, PR 95%, Ki-67 5%, HER-2 negative ratio 1.32) and 1.2 cm (ER 95% PR 95% positive Her 2 Positive ratio 3.2, Ki-67 5%) T2 N0 stage Ib Right mastectomy: 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 Adjuvant chemotherapy with TCH x 4 cycles completed on 06/09/2017 (Taxotere held from final 2 cycles)followed by Herceptin maintenance for 1 year 2. Adjuvant radiation therapystarted 08/10/2017-09/22/2017 3. Followed by adjuvant antiestrogen therapyletrozole started 09/24/2017 switched to anastrozole January 2020 4.Recurrent/metastatic disease: PET CT scan 08/23/2020: Widespread FDG avid nodal and bone disease suspicious for metastases.Biopsy: Cervical lymph node: Metastatic breast cancer ER 0%, PR 0%, HER2 positive, Ki-67 15% [facility] molecular testing: PD-L1 positive MMR proficient _______________________________________________________________________________________ Treatment Plan: Enhertu q 3 weeks, today cycle 2 Bone Mets: Xgeva every 3 months  Enhertu toxicities:  Return to clinic in 3 weeks for cycle 3.

## 2020-10-11 ENCOUNTER — Telehealth: Payer: Self-pay | Admitting: Hematology and Oncology

## 2020-10-11 NOTE — Telephone Encounter (Signed)
Scheduled per 7/20 los. Called pt and left a msg

## 2020-10-28 ENCOUNTER — Ambulatory Visit: Payer: 59 | Admitting: Hematology and Oncology

## 2020-10-29 NOTE — Progress Notes (Signed)
Patient Care Team: Panosh, Standley Brooking, MD as PCP - General Delsa Bern, MD (Obstetrics and Gynecology) Excell Seltzer, MD (Inactive) as Consulting Physician (General Surgery) Nicholas Lose, MD as Consulting Physician (Hematology and Oncology) Kyung Rudd, MD as Consulting Physician (Radiation Oncology) Gardenia Phlegm, NP as Nurse Practitioner (Hematology and Oncology)  DIAGNOSIS:    ICD-10-CM   1. Malignant neoplasm of overlapping sites of left breast in female, estrogen receptor negative ([place])  C50.812    Z17.1       SUMMARY OF ONCOLOGIC HISTORY: Oncology History  Malignant neoplasm of overlapping 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 breast ([place])  01/12/2017 Surgery   Left mastectomy: IDC with DCIS, 2 tumors 3.6 cm ER 90%, PR 95%, Ki-67 5%, HER-2 negative ratio 1.32) and 1.2 cm (ER 95% PR 95% positive Her 2 Positive ratio 3.2, Ki-67 5%) T2 N0 stage Ib   02/16/2017 -  Chemotherapy   TCH x6 followed by Herceptin maintenance     10/02/2020 Miscellaneous   [facility] molecular test: PD-L1 positive CPS: 10, ER/PR/HER2 negative, MLH1 positive, MSH2 positive, MSH6 positive, MMR proficient, PTEN: Negative   Malignant neoplasm of overlapping sites of right breast in female, estrogen receptor positive ([place])  12/21/2016 Initial Diagnosis   Palpable lumps in the right breast with nipple inversion: Mammogram revealed skin thickening and distortion, ultrasound revealed 4 masses 3.1 cm at 11:00, 2.5 cm at 6:00, 4 cm at 8:00, 2.2 cm at 5:00 with 2 abnormal axillary lymph nodes: MRI revealed 7 cm abnormality on right breast, in addition 3.6 cm abnormality in the left breast and 2 additional masses 1 cm each; right breast: T4 N1 stage III B clinical stage; left breast: T2 N0 stage IB clinical stage    12/21/2016 Pathology Results   Right breast: Grade 1 Invasive lobular cancer, lymph node also positive, ER 70%, PR 90%, Ki-67 10%, HER-2 negative ratio 1.09 Left breast: Grade 1 IDC with DCIS prognostic  panel pending    01/04/2017 Genetic Testing   The patient had genetic testing due to a personal history of bilateral breast caner and a family history of breast cancer.  The Multi-Cancer Panel was ordered. The Multi-Cancer Panel offered by [facility] includes sequencing and/or deletion duplication testing of the following 83 genes: ALK, APC, ATM, AXIN2,BAP1,  BARD1, BLM, BMPR1A, BRCA1, BRCA2, BRIP1, CASR, CDC73, CDH1, CDK4, CDKN1B, CDKN1C, CDKN2A (p14ARF), CDKN2A (p16INK4a), CEBPA, CHEK2, CTNNA1, DICER1, DIS3L2, EGFR (c.2369C>T, p.Thr790Met variant only), EPCAM (Deletion/duplication testing only), FH, FLCN, GATA2, GPC3, GREM1 (Promoter region deletion/duplication testing only), HOXB13 (c.251G>A, p.Gly84Glu), HRAS, KIT, MAX, MEN1, MET, MITF (c.952G>A, p.Glu318Lys variant only), MLH1, MSH2, MSH3, MSH6, MUTYH, NBN, NF1, NF2, NTHL1, PALB2, PDGFRA, PHOX2B, PMS2, POLD1, POLE, POT1, PRKAR1A, PTCH1, PTEN, RAD50, RAD51C, RAD51D, RB1, RECQL4, RET, RUNX1, SDHAF2, SDHA (sequence changes only), SDHB, SDHC, SDHD, SMAD4, SMARCA4, SMARCB1, SMARCE1, STK11, SUFU, TERC, TERT, TMEM127, TP53, TSC1, TSC2, VHL, WRN and WT1.   Results: Negative, no pathogenic variants identified.  The date of this test report is 01/04/2017.      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 the dermis of the skin of the nipple, margins negative, 3/3 lymph nodes positive, grade 1, EF 75%, PR 95%, HER-2 negative ratio 1.19, Ki-67 10%, T3N1A stage IIa    02/16/2017 - 06/09/2017 Chemotherapy   TCH x6 cycles followed by Herceptin maintenance    08/10/2017 - 09/22/2017 Radiation Therapy   Adjuvant radiation    09/24/2017 -  Anti-estrogen oral therapy   Letrozole 2.5 mg  daily, changed to Anastrozole over 02/2018 due to arthralgias    Bilateral breast cancer ([place])  01/12/2017 Initial Diagnosis   Bilateral breast cancer ([place])    09/18/2020 -  Chemotherapy    Patient is on Treatment Plan: BREAST  METASTATIC FAM-TRASTUZUMAB DERUXTECAN-NXKI (ENHERTU) Q21D         CHIEF COMPLIANT: Cycle 3 Enhertu  INTERVAL HISTORY: Amanda Williams is a 55 y.o. with above-mentioned history of recurrent metastatic breast cancer currently on chemotherapy with Enhertu. She reports to the clinic today for cycle 3.  She is tolerating Enhertu extremely well.    Her major complaint is nausea.  With addition of Emend her nausea only lasted for a week.  She has not taken any nausea medicines because it makes her groggy.  She also complains of profound fatigue.  She has profound hair loss.  ALLERGIES:  is allergic to penicillins, sulfa antibiotics, sulfonamide derivatives, other, and adhesive [tape].  MEDICATIONS:  Current Outpatient Medications  Medication Sig Dispense Refill   acetaminophen (TYLENOL) 500 MG tablet Take 1,000 mg by mouth as needed for moderate pain (for pain/headaches.).      anastrozole (ARIMIDEX) 1 MG tablet Take 1 tablet (1 mg total) by mouth daily. 90 tablet 3   calcium carbonate (CALTRATE 600) 1500 (600 Ca) MG TABS tablet Take 1 tablet (1,500 mg total) by mouth daily with breakfast.  0   diphenoxylate-atropine (LOMOTIL) 2.5-0.025 MG tablet Take 1 tablet by mouth 4 (four) times daily as needed for diarrhea or loose stools. 30 tablet 3   ibuprofen (ADVIL,MOTRIN) 200 MG tablet Take 400 mg by mouth as needed for moderate pain (for pain/headaches).      lidocaine-prilocaine (EMLA) cream Apply to affected area once 30 g 3   LORazepam (ATIVAN) 0.5 MG tablet Take 1 tablet (0.5 mg total) by mouth every 6 (six) hours as needed (Nausea or vomiting). 60 tablet 3   MULTIPLE VITAMIN PO Take 1 tablet by mouth daily.     ondansetron (ZOFRAN) 8 MG tablet Take 1 tablet (8 mg total) by mouth 2 (two) times daily as needed for refractory nausea / vomiting. Start on day 3 after chemo. 30 tablet 1   prochlorperazine (COMPAZINE) 10 MG tablet Take 1 tablet (10 mg total) by mouth every 6 (six) hours as needed (Nausea  or vomiting). 30 tablet 1   sertraline (ZOLOFT) 100 MG tablet Take 1 tablet (100 mg total) by mouth daily. 90 tablet 0   No current facility-administered medications for this visit.   Facility-Administered Medications Ordered in Other Visits  Medication Dose Route Frequency Provider Last Rate Last Admin   heparin lock flush 100 unit/mL  500 Units Intracatheter Once Nicholas Lose, MD        PHYSICAL EXAMINATION: ECOG PERFORMANCE STATUS: 1 - Symptomatic but completely ambulatory  Vitals:   10/30/20 1102  BP: 105/72  Pulse: 74  Resp: 18  Temp: 97.7 F (36.5 C)  SpO2: 100%   Filed Weights   10/30/20 1102  Weight: 146 lb 11.2 oz (66.5 kg)    LABORATORY DATA:  I have reviewed the data as listed CMP Latest Ref Rng & Units 10/09/2020 09/18/2020 08/16/2020  Glucose 70 - 99 mg/dL 94 133(H) 107(H)  BUN 6 - 20 mg/dL _0 Creatinine 0.44 - 1.00 mg/dL 0.72 0.80 0.78  Sodium 135 - 145 mmol/L 141 139 139  Potassium 3.5 - 5.1 mmol/L 4.2 4.0 3.7  Chloride 98 - 111 mmol/L 108 105 102  CO2 22 -  32 mmol/L _0 Calcium 8.9 - 10.3 mg/dL 9.1 9.1 10.0  Total Protein 6.5 - 8.1 g/dL 7.3 7.2 8.6(H)  Total Bilirubin 0.3 - 1.2 mg/dL 0.3 0.3 0.6  Alkaline Phos 38 - 126 U/L 187(H) 175(H) 163(H)  AST 15 - 41 U/L _1 ALT 0 - 44 U/L _2 Lab Results  Component Value Date   WBC 4.4 10/30/2020   HGB 11.3 (L) 10/30/2020   HCT 33.2 (L) 10/30/2020   MCV 88.8 10/30/2020   PLT 310 10/30/2020   NEUTROABS 2.6 10/30/2020    ASSESSMENT & PLAN:  Malignant neoplasm of overlapping 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 breast ([place]) Bilateral breast cancers 01/12/2017: Left mastectomy: IDC with DCIS, 2 tumors 3.6 cm ER 90%, PR 95%, Ki-67 5%, HER-2 negative ratio 1.32) and 1.2 cm (ER 95% PR 95% positive Her 2 Positive ratio 3.2, Ki-67 5%) T2 N0 stage Ib Right mastectomy: Invasive and in situ lobular carcinoma involves the dermis of the skin of the nipple, margins negative, 3/3 lymph nodes positive, grade 1,  ER 75%, PR 95%, HER-2 negative ratio 1.19, Ki-67 10%, T3N1A stage IIa   Treatment plan: 1.  Adjuvant chemotherapy with TCH x 4 cycles completed on 06/09/2017 (Taxotere held from final 2 cycles) followed by Herceptin maintenance for 1 year 2. Adjuvant radiation therapy started 08/10/2017-09/22/2017 3. Followed by adjuvant antiestrogen therapy letrozole started 09/24/2017 switched to anastrozole January 2020 4.  Recurrent/metastatic disease: PET CT scan 08/23/2020: Widespread FDG avid nodal and bone disease suspicious for metastases.  Biopsy: Cervical lymph node: Metastatic breast cancer ER 0%, PR 0%, HER2 positive, Ki-67 15% [facility] molecular testing: PD-L1 positive MMR proficient  _______________________________________________________________________________________ Treatment Plan: Enhertu q 3 weeks, today cycle 3 Bone Mets: Xgeva every 3 months  Enhertu toxicities:  Fatigue 2. Severe nausea: We reduced the dosage with cycle 2 and added Emend.  She did much better but the nausea still lasted for whole week.  Encouraged her to take Zofran prior to eating meals. We will remove Benadryl for treatment plan. 3. Profound hair loss Scans have been scheduled.  Return to clinic in 3 weeks for cycle 4 and to review the scans.  We will see if he needs to remain on the same dose level or if eating to reduce that treatment any further.    No orders of the defined types were placed in this encounter.  The patient has a good understanding of the overall plan. she agrees with it. she will call with any problems that may develop before the next visit here.  Total time spent: 30 mins including face to face time and time spent for planning, charting and coordination of care  Rulon Eisenmenger, MD, MPH 10/30/2020  I, Thana Ates, am acting as scribe for Dr. Nicholas Lose.  I have reviewed the above documentation for accuracy and completeness, and I agree with the above.

## 2020-10-29 NOTE — Assessment & Plan Note (Signed)
Bilateral breast cancers 01/12/2017:Left mastectomy: IDC with DCIS, 2 tumors 3.6 cm ER 90%, PR 95%, Ki-67 5%, HER-2 negative ratio 1.32) and 1.2 cm (ER 95% PR 95% positive Her 2 Positive ratio 3.2, Ki-67 5%) T2 N0 stage Ib Right mastectomy: 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 Adjuvant chemotherapy with TCH x 4 cycles completed on 06/09/2017 (Taxotere held from final 2 cycles)followed by Herceptin maintenance for 1 year 2. Adjuvant radiation therapystarted 08/10/2017-09/22/2017 3. Followed by adjuvant antiestrogen therapyletrozole started 09/24/2017 switched to anastrozole January 2020 4.Recurrent/metastatic disease: PET CT scan 08/23/2020: Widespread FDG avid nodal and bone disease suspicious for metastases.Biopsy: Cervical lymph node: Metastatic breast cancer ER 0%, PR 0%, HER2 positive, Ki-67 15% [facility] molecular testing: PD-L1 positive MMR proficient _______________________________________________________________________________________ Treatment Plan: Enhertu q 3 weeks, today cycle 3 Bone Mets: Xgeva every 3 months  Enhertu toxicities:  Return to clinic in 3 weeks for cycle 4

## 2020-10-30 ENCOUNTER — Inpatient Hospital Stay (HOSPITAL_BASED_OUTPATIENT_CLINIC_OR_DEPARTMENT_OTHER): Payer: BC Managed Care – PPO | Admitting: Hematology and Oncology

## 2020-10-30 ENCOUNTER — Inpatient Hospital Stay: Payer: BC Managed Care – PPO | Attending: Hematology and Oncology

## 2020-10-30 ENCOUNTER — Inpatient Hospital Stay: Payer: BC Managed Care – PPO

## 2020-10-30 ENCOUNTER — Other Ambulatory Visit: Payer: Self-pay

## 2020-10-30 DIAGNOSIS — R110 Nausea: Secondary | ICD-10-CM | POA: Diagnosis not present

## 2020-10-30 DIAGNOSIS — C50012 Malignant neoplasm of nipple and areola, left female breast: Secondary | ICD-10-CM

## 2020-10-30 DIAGNOSIS — Z9013 Acquired absence of bilateral breasts and nipples: Secondary | ICD-10-CM | POA: Insufficient documentation

## 2020-10-30 DIAGNOSIS — Z5111 Encounter for antineoplastic chemotherapy: Secondary | ICD-10-CM | POA: Diagnosis not present

## 2020-10-30 DIAGNOSIS — Z923 Personal history of irradiation: Secondary | ICD-10-CM | POA: Diagnosis not present

## 2020-10-30 DIAGNOSIS — Z9221 Personal history of antineoplastic chemotherapy: Secondary | ICD-10-CM | POA: Insufficient documentation

## 2020-10-30 DIAGNOSIS — Z79811 Long term (current) use of aromatase inhibitors: Secondary | ICD-10-CM | POA: Insufficient documentation

## 2020-10-30 DIAGNOSIS — Z171 Estrogen receptor negative status [ER-]: Secondary | ICD-10-CM | POA: Insufficient documentation

## 2020-10-30 DIAGNOSIS — C50811 Malignant neoplasm of overlapping sites of right female breast: Secondary | ICD-10-CM | POA: Insufficient documentation

## 2020-10-30 DIAGNOSIS — Z5112 Encounter for antineoplastic immunotherapy: Secondary | ICD-10-CM | POA: Diagnosis not present

## 2020-10-30 DIAGNOSIS — C50812 Malignant neoplasm of overlapping 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 breast: Secondary | ICD-10-CM

## 2020-10-30 DIAGNOSIS — C7951 Secondary malignant neoplasm of bone: Secondary | ICD-10-CM | POA: Insufficient documentation

## 2020-10-30 DIAGNOSIS — R5383 Other fatigue: Secondary | ICD-10-CM | POA: Insufficient documentation

## 2020-10-30 DIAGNOSIS — C50011 Malignant neoplasm of nipple and areola, right female breast: Secondary | ICD-10-CM

## 2020-10-30 DIAGNOSIS — Z170 Estrogen receptor positive status [ER+]: Secondary | ICD-10-CM

## 2020-10-30 LAB — CMP (CANCER CENTER ONLY)
ALT: 21 U/L (ref 0–44)
AST: 25 U/L (ref 15–41)
Albumin: 3.6 g/dL (ref 3.5–5.0)
Alkaline Phosphatase: 168 U/L — ABNORMAL HIGH (ref 38–126)
Anion gap: 8 (ref 5–15)
BUN: 10 mg/dL (ref 6–20)
CO2: 25 mmol/L (ref 22–32)
Calcium: 8.9 mg/dL (ref 8.9–10.3)
Chloride: 107 mmol/L (ref 98–111)
Creatinine: 0.73 mg/dL (ref 0.44–1.00)
GFR, Estimated: >60 mL/min (ref >60)
Glucose, Bld: 105 mg/dL — ABNORMAL HIGH (ref 70–99)
Potassium: 4.1 mmol/L (ref 3.5–5.1)
Sodium: 140 mmol/L (ref 135–145)
Total Bilirubin: 0.5 mg/dL (ref 0.3–1.2)
Total Protein: 6.9 g/dL (ref 6.5–8.1)

## 2020-10-30 LAB — CBC WITH DIFFERENTIAL (CANCER CENTER ONLY)
Abs Immature Granulocytes: 0.01 10*3/uL (ref 0.00–0.07)
Basophils Absolute: 0 10*3/uL (ref 0.0–0.1)
Basophils Relative: 1 %
Eosinophils Absolute: 0.1 10*3/uL (ref 0.0–0.5)
Eosinophils Relative: 2 %
HCT: 33.2 % — ABNORMAL LOW (ref 36.0–46.0)
Hemoglobin: 11.3 g/dL — ABNORMAL LOW (ref 12.0–15.0)
Immature Granulocytes: 0 %
Lymphocytes Relative: 29 %
Lymphs Abs: 1.3 10*3/uL (ref 0.7–4.0)
MCH: 30.2 pg (ref 26.0–34.0)
MCHC: 34 g/dL (ref 30.0–36.0)
MCV: 88.8 fL (ref 80.0–100.0)
Monocytes Absolute: 0.4 10*3/uL (ref 0.1–1.0)
Monocytes Relative: 10 %
Neutro Abs: 2.6 10*3/uL (ref 1.7–7.7)
Neutrophils Relative %: 58 %
Platelet Count: 310 10*3/uL (ref 150–400)
RBC: 3.74 MIL/uL — ABNORMAL LOW (ref 3.87–5.11)
RDW: 14.2 % (ref 11.5–15.5)
WBC Count: 4.4 10*3/uL (ref 4.0–10.5)
nRBC: 0 % (ref 0.0–0.2)

## 2020-10-30 MED ORDER — DEXTROSE 5 % IV SOLN
Freq: Once | INTRAVENOUS | Status: AC
  Filled 2020-10-30: qty 250

## 2020-10-30 MED ORDER — PALONOSETRON HCL INJECTION 0.25 MG/5ML
0.2500 mg | Freq: Once | INTRAVENOUS | Status: AC
  Administered 2020-10-30: 0.25 mg via INTRAVENOUS

## 2020-10-30 MED ORDER — ACETAMINOPHEN 325 MG PO TABS
650.0000 mg | ORAL_TABLET | Freq: Once | ORAL | Status: AC
  Administered 2020-10-30: 650 mg via ORAL

## 2020-10-30 MED ORDER — HEPARIN SOD (PORK) LOCK FLUSH 100 UNIT/ML IV SOLN
500.0000 [IU] | Freq: Once | INTRAVENOUS | Status: DC | PRN
  Filled 2020-10-30: qty 5

## 2020-10-30 MED ORDER — SODIUM CHLORIDE 0.9% FLUSH
10.0000 mL | INTRAVENOUS | Status: DC | PRN
  Administered 2020-10-30: 10 mL
  Filled 2020-10-30: qty 10

## 2020-10-30 MED ORDER — SODIUM CHLORIDE 0.9% FLUSH
10.0000 mL | Freq: Once | INTRAVENOUS | Status: AC
  Administered 2020-10-30: 10 mL
  Filled 2020-10-30: qty 10

## 2020-10-30 MED ORDER — SODIUM CHLORIDE 0.9 % IV SOLN
150.0000 mg | Freq: Once | INTRAVENOUS | Status: AC
  Administered 2020-10-30: 150 mg via INTRAVENOUS
  Filled 2020-10-30: qty 150

## 2020-10-30 MED ORDER — HEPARIN SOD (PORK) LOCK FLUSH 100 UNIT/ML IV SOLN
500.0000 [IU] | Freq: Once | INTRAVENOUS | Status: AC
  Administered 2020-10-30: 500 [IU]
  Filled 2020-10-30: qty 5

## 2020-10-30 MED ORDER — FAM-TRASTUZUMAB DERUXTECAN-NXKI CHEMO 100 MG IV SOLR
4.4500 mg/kg | Freq: Once | INTRAVENOUS | Status: AC
  Administered 2020-10-30: 300 mg via INTRAVENOUS
  Filled 2020-10-30: qty 15

## 2020-10-30 MED ORDER — SODIUM CHLORIDE 0.9 % IV SOLN
10.0000 mg | Freq: Once | INTRAVENOUS | Status: AC
  Administered 2020-10-30: 10 mg via INTRAVENOUS
  Filled 2020-10-30: qty 10

## 2020-10-30 MED ORDER — PALONOSETRON HCL INJECTION 0.25 MG/5ML
INTRAVENOUS | Status: AC
  Filled 2020-10-30: qty 5

## 2020-10-30 MED ORDER — ACETAMINOPHEN 325 MG PO TABS
ORAL_TABLET | ORAL | Status: AC
  Filled 2020-10-30: qty 2

## 2020-10-30 NOTE — Patient Instructions (Signed)
[facility]  Discharge Instructions: ?Thank you for choosing [facility] to provide your oncology and hematology care.  ? ?If you have a lab appointment with the Cancer Center, please go directly to the Cancer Center and check in at the registration area. ?  ?Wear comfortable clothing and clothing appropriate for easy access to any Portacath or PICC line.  ? ?We strive to give you quality time with your provider. You may need to reschedule your appointment if you arrive late (15 or more minutes).  Arriving late affects you and other patients whose appointments are after yours.  Also, if you miss three or more appointments without notifying the office, you may be dismissed from the clinic at the provider?s discretion.    ?  ?For prescription refill requests, have your pharmacy contact our office and allow 72 hours for refills to be completed.   ? ?Today you received the following chemotherapy and/or immunotherapy agents: Enhertu.    ?  ?To help prevent nausea and vomiting after your treatment, we encourage you to take your nausea medication as directed. ? ?BELOW ARE SYMPTOMS THAT SHOULD BE REPORTED IMMEDIATELY: ?*FEVER GREATER THAN 100.4 F (38 ?C) OR HIGHER ?*CHILLS OR SWEATING ?*NAUSEA AND VOMITING THAT IS NOT CONTROLLED WITH YOUR NAUSEA MEDICATION ?*UNUSUAL SHORTNESS OF BREATH ?*UNUSUAL BRUISING OR BLEEDING ?*URINARY PROBLEMS (pain or burning when urinating, or frequent urination) ?*BOWEL PROBLEMS (unusual diarrhea, constipation, pain near the anus) ?TENDERNESS IN MOUTH AND THROAT WITH OR WITHOUT PRESENCE OF ULCERS (sore throat, sores in mouth, or a toothache) ?UNUSUAL RASH, SWELLING OR PAIN  ?UNUSUAL VAGINAL DISCHARGE OR ITCHING  ? ?Items with * indicate a potential emergency and should be followed up as soon as possible or go to the Emergency Department if any problems should occur. ? ?Please show the CHEMOTHERAPY ALERT CARD or IMMUNOTHERAPY ALERT CARD at check-in to  the Emergency Department and triage nurse. ? ?Should you have questions after your visit or need to cancel or reschedule your appointment, please contact [facility]  Dept: [phone]  and follow the prompts.  Office hours are 8:00 a.m. to 4:30 p.m. Monday - Friday. Please note that voicemails left after 4:00 p.m. may not be returned until the following business day.  We are closed weekends and major holidays. You have access to a nurse at all times for urgent questions. Please call the main number to the clinic Dept: [phone] and follow the prompts. ? ? ?For any non-urgent questions, you may also contact your provider using MyChart. We now offer e-Visits for anyone 18 and older to request care online for non-urgent symptoms. For details visit [email or web address]. ?  ?Also download the MyChart app! Go to the app store, search "MyChart", open the app, select , and log in with your MyChart username and password. ? ?Due to Covid, a mask is required upon entering the hospital/clinic. If you do not have a mask, one will be given to you upon arrival. For doctor visits, patients may have 1 support person aged 18 or older with them. For treatment visits, patients cannot have anyone with them due to current Covid guidelines and our immunocompromised population.  ? ?

## 2020-10-30 NOTE — Patient Instructions (Signed)
Implanted Port Home Guide An implanted port is a device that is placed under the skin. It is usually placed in the chest. The device can be used to give IV medicine, to take blood, or for dialysis. You may have an implanted port if: You need IV medicine that would be irritating to the small veins in your hands or arms. You need IV medicines, such as antibiotics, for a long period of time. You need IV nutrition for a long period of time. You need dialysis. When you have a port, your health care provider can choose to use the port instead of veins in your arms for these procedures. You may have fewer limitations when using a port than you would if you used other types of long-term IVs, and you will likely be able to return to normal activities afteryour incision heals. An implanted port has two main parts: Reservoir. The reservoir is the part where a needle is inserted to give medicines or draw blood. The reservoir is round. After it is placed, it appears as a small, raised area under your skin. Catheter. The catheter is a thin, flexible tube that connects the reservoir to a vein. Medicine that is inserted into the reservoir goes into the catheter and then into the vein. How is my port accessed? To access your port: A numbing cream may be placed on the skin over the port site. Your health care provider will put on a mask and sterile gloves. The skin over your port will be cleaned carefully with a germ-killing soap and allowed to dry. Your health care provider will gently pinch the port and insert a needle into it. Your health care provider will check for a blood return to make sure the port is in the vein and is not clogged. If your port needs to remain accessed to get medicine continuously (constant infusion), your health care provider will place a clear bandage (dressing) over the needle site. The dressing and needle will need to be changed every week, or as told by your health care provider. What  is flushing? Flushing helps keep the port from getting clogged. Follow instructions from your health care provider about how and when to flush the port. Ports are usually flushed with saline solution or a medicine called heparin. The need for flushing will depend on how the port is used: If the port is only used from time to time to give medicines or draw blood, the port may need to be flushed: Before and after medicines have been given. Before and after blood has been drawn. As part of routine maintenance. Flushing may be recommended every 4-6 weeks. If a constant infusion is running, the port may not need to be flushed. Throw away any syringes in a disposal container that is meant for sharp items (sharps container). You can buy a sharps container from a pharmacy, or you can make one by using an empty hard plastic bottle with a cover. How long will my port stay implanted? The port can stay in for as long as your health care provider thinks it is needed. When it is time for the port to come out, a surgery will be done to remove it. The surgery will be similar to the procedure that was done to putthe port in. Follow these instructions at home:  Flush your port as told by your health care provider. If you need an infusion over several days, follow instructions from your health care provider about how to take   care of your port site. Make sure you: Wash your hands with soap and water before you change your dressing. If soap and water are not available, use alcohol-based hand sanitizer. Change your dressing as told by your health care provider. Place any used dressings or infusion bags into a plastic bag. Throw that bag in the trash. Keep the dressing that covers the needle clean and dry. Do not get it wet. Do not use scissors or sharp objects near the tube. Keep the tube clamped, unless it is being used. Check your port site every day for signs of infection. Check for: Redness, swelling, or  pain. Fluid or blood. Pus or a bad smell. Protect the skin around the port site. Avoid wearing bra straps that rub or irritate the site. Protect the skin around your port from seat belts. Place a soft pad over your chest if needed. Bathe or shower as told by your health care provider. The site may get wet as long as you are not actively receiving an infusion. Return to your normal activities as told by your health care provider. Ask your health care provider what activities are safe for you. Carry a medical alert card or wear a medical alert bracelet at all times. This will let health care providers know that you have an implanted port in case of an emergency. Get help right away if: You have redness, swelling, or pain at the port site. You have fluid or blood coming from your port site. You have pus or a bad smell coming from the port site. You have a fever. Summary Implanted ports are usually placed in the chest for long-term IV access. Follow instructions from your health care provider about flushing the port and changing bandages (dressings). Take care of the area around your port by avoiding clothing that puts pressure on the area, and by watching for signs of infection. Protect the skin around your port from seat belts. Place a soft pad over your chest if needed. Get help right away if you have a fever or you have redness, swelling, pain, drainage, or a bad smell at the port site. This information is not intended to replace advice given to you by your health care provider. Make sure you discuss any questions you have with your healthcare provider. Document Revised: 07/24/2019 Document Reviewed: 07/24/2019 Elsevier Patient Education  2022 Elsevier Inc.  

## 2020-10-31 ENCOUNTER — Ambulatory Visit (HOSPITAL_COMMUNITY)
Admission: RE | Admit: 2020-10-31 | Discharge: 2020-10-31 | Disposition: A | Discharge status: Home / Self Care | Payer: BC Managed Care – PPO | Source: Ambulatory Visit | Attending: Internal Medicine | Admitting: Internal Medicine

## 2020-10-31 ENCOUNTER — Encounter (HOSPITAL_COMMUNITY): Payer: Self-pay | Admitting: Internal Medicine

## 2020-10-31 VITALS — BP 94/67 | HR 86 | Wt 146.8 lb

## 2020-10-31 DIAGNOSIS — C50911 Malignant neoplasm of unspecified site of right female breast: Secondary | ICD-10-CM | POA: Insufficient documentation

## 2020-10-31 DIAGNOSIS — C7951 Secondary malignant neoplasm of bone: Secondary | ICD-10-CM | POA: Diagnosis not present

## 2020-10-31 DIAGNOSIS — Z170 Estrogen receptor positive status [ER+]: Secondary | ICD-10-CM | POA: Diagnosis not present

## 2020-10-31 DIAGNOSIS — C50811 Malignant neoplasm of overlapping sites of right female breast: Secondary | ICD-10-CM | POA: Diagnosis not present

## 2020-10-31 DIAGNOSIS — Z803 Family history of malignant neoplasm of breast: Secondary | ICD-10-CM | POA: Diagnosis not present

## 2020-10-31 DIAGNOSIS — C50912 Malignant neoplasm of unspecified site of left female breast: Secondary | ICD-10-CM | POA: Diagnosis not present

## 2020-10-31 DIAGNOSIS — Z882 Allergy status to sulfonamides status: Secondary | ICD-10-CM | POA: Insufficient documentation

## 2020-10-31 DIAGNOSIS — Z880 Allergy status to penicillin: Secondary | ICD-10-CM | POA: Insufficient documentation

## 2020-10-31 DIAGNOSIS — Z9221 Personal history of antineoplastic chemotherapy: Secondary | ICD-10-CM | POA: Diagnosis not present

## 2020-10-31 DIAGNOSIS — Z79899 Other long term (current) drug therapy: Secondary | ICD-10-CM | POA: Diagnosis not present

## 2020-10-31 NOTE — Patient Instructions (Signed)
Follow up and echo in 3 months.  

## 2020-10-31 NOTE — Progress Notes (Signed)
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 Lindi Adie for enrollment into the Cardio-Oncology program.  12/2016 Found to have bilateral breast CA. Underwent mastectomy R then left.   Right breast: Grade 1 Invasive lobular cancer, lymph node also positive, ER 70%, PR 90%, Ki-67 10%, HER-2 negative ratio 1.09   Left Breast: IDC with DCIS, 2 tumors 3.6 cm ER 90%, PR 95%, Ki-67 5%, HER-2 negative ratio 1.32) and 1.2 cm (ER 95% PR 95% positive Her 2 Positive ratio 3.2, Ki-67 5%) T2 N0 stage Ib.    01/2017-05/2017 Chemotherapy TCH (docetaxel/carboplatin) x6 followed by herceptin maintenance  08/10/2017 - 09/22/2017: Radiation therapy 09/24/2017 antiestrogen therapy  08/2020 surveillance PET scan showed recurrent/metastatic disease diffuse lymph nodal disease, bony metastasis.  Biopsy confirmed breast carcinoma  08/2020 started on FAM-TRASTUZUMAB DERUXTECAN-NXKI [facility]) Q21D   Here with her husband. Feeling pretty good except she gets wiped out for a few days after infusions.. Denies any h/o known cardiac disease. No SOB, edema, orthopnea or PND.   ECHO 10/07/2020 EF 50-55%, normal GLS -18%  ECHO 01/2017 EF 55-60%, normal GLS -21% Echo 09/09/2017 EF 55-60%, GLS -18% Echo 12/13/2017 EF 50-55%, normal GLS -18%   Review of Systems: [y] = yes, _0  = no   General: Weight gain _1 ; Weight loss _2 ; Anorexia _3 ; Fatigue [ y]; Fever _4 ; Chills _5 ; Weakness _6   Cardiac: Chest pain/pressure _7 ; Resting SOB _8 ; Exertional SOB _9 ; Orthopnea _10 ; Pedal Edema _11 ; Palpitations _12 ; Syncope _13 ; Presyncope _14 ; Paroxysmal nocturnal dyspnea_15   Pulmonary: Cough _16 ; Wheezing_17 ; Hemoptysis_18 ; Sputum _19 ; Snoring _20   GI: Vomiting_21 ; Dysphagia_22 ; Melena_23 ; Hematochezia _24 ; Heartburn_25 ;  Abdominal pain _26 ; Constipation _27 ; Diarrhea _28 ; BRBPR _29   GU: Hematuria_30 ; Dysuria _31 ; Nocturia_32   Vascular: Pain in legs with walking _33 ; Pain in feet with lying flat _34 ; Non-healing sores _35 ; Stroke _36 ; TIA _37 ; Slurred speech _38 ;  Neuro: Headaches_39 ; Vertigo_40 ; Seizures_41 ; Paresthesias_42 ;Blurred vision _43 ; Diplopia _44 ; Vision changes _45   Ortho/Skin: Arthritis _46 ; Joint pain _47 ; Muscle pain _48 ; Joint swelling _49 ; Back Pain _50 ; Rash _51   Psych: Depression_52 ; Anxiety_53   Heme: Bleeding problems _54 ; Clotting disorders _55 ; Anemia _56   Endocrine: Diabetes _57 ; Thyroid dysfunction_58    Past Medical History:  Diagnosis Date   Allergy    Breast cancer ([place])    Difficult intubation    see 01/12/17 anesthesia record   Family history of breast cancer    Hematuria    ? trigonitis   History of kidney stones    Panic attack    echo 2009 normal    Current Outpatient Medications  Medication Sig Dispense Refill   acetaminophen (TYLENOL) 500 MG tablet Take 1,000 mg by mouth as needed for moderate pain (for pain/headaches.).      calcium carbonate (CALTRATE 600) 1500 (600 Ca) MG TABS tablet Take 1 tablet (1,500 mg total) by mouth daily with breakfast.  0   diphenoxylate-atropine (LOMOTIL) 2.5-0.025 MG tablet Take 1 tablet by mouth 4 (four) times daily as needed for diarrhea or loose stools. 30 tablet 3  ibuprofen (ADVIL,MOTRIN) 200 MG tablet Take 400 mg by mouth as needed for moderate pain (for pain/headaches).      lidocaine-prilocaine (EMLA) cream Apply to affected area once 30 g 3   LORazepam (ATIVAN) 0.5 MG tablet Take 1 tablet (0.5 mg total) by mouth every 6 (six) hours as needed (Nausea or vomiting). 60 tablet 3   MULTIPLE VITAMIN PO Take 1 tablet by mouth daily.     omeprazole (PRILOSEC) 20 MG capsule Take 20 mg by mouth daily.     ondansetron (ZOFRAN) 8 MG tablet Take 1 tablet (8 mg total) by mouth 2 (two) times daily as needed for refractory nausea / vomiting.  Start on day 3 after chemo. 30 tablet 1   prochlorperazine (COMPAZINE) 10 MG tablet Take 1 tablet (10 mg total) by mouth every 6 (six) hours as needed (Nausea or vomiting). 30 tablet 1   sertraline (ZOLOFT) 100 MG tablet Take 1 tablet (100 mg total) by mouth daily. 90 tablet 0   No current facility-administered medications for this encounter.    Allergies  Allergen Reactions   Penicillins Hives, Other (See Comments) and Rash    Has patient had a PCN reaction causing immediate rash, facial/tongue/throat swelling, SOB or lightheadedness with hypotension: No HAS PT DEVELOPED SEVERE RASH INVOLVING MUCUS MEMBRANES or SKIN NECROSIS: #  #  #  YES  #  #  #   Has patient had a PCN reaction that required hospitalization: No Has patient had a PCN reaction occurring within the last 10 years: No If all of the above answers are "NO", then may proceed with Cephalosporin use.  Has patient had a PCN reaction causing immediate rash, facial/tongue/throat swelling, SOB or lightheadedness with hypotension: No HAS PT DEVELOPED SEVERE RASH INVOLVING MUCUS MEMBRANES or SKIN NECROSIS: #  #  #  YES  #  #  #   Has patient had a PCN reaction that required hospitalization: No Has patient had a PCN reaction occurring within the last 10 years: No If all of the above answers are "NO", then may proceed with Cephalosporin use. Has patient had a PCN reaction causing immediate rash, facial/tongue/throat swelling, SOB or lightheadedness with hypotension: No HAS PT DEVELOPED SEVERE RASH INVOLVING MUCUS MEMBRANES or SKIN NECROSIS: #  #  #  YES  #  #  #   Has patient had a PCN reaction that required hospitalization: No Has patient had a PCN reaction occurring within the last 10 years: No If all of the above answers are "NO", then may proceed with Cephalosporin use.   Sulfa Antibiotics Hives   Sulfonamide Derivatives Hives   Other Other (See Comments)    Redness, rash and blister   Adhesive [Tape] Other (See Comments)     Redness       Social History   Socioeconomic History   Marital status: Married    Spouse name: Not on file   Number of children: Not on file   Years of education: Not on file   Highest education level: Not on file  Occupational History   Not on file  Tobacco Use   Smoking status: Never   Smokeless tobacco: Never  Vaping Use   Vaping Use: Never used  Substance and Sexual Activity   Alcohol use: Yes    Alcohol/week: 4.0 standard drinks    Types: 4 Glasses of wine per week    Comment: ocassionally   Drug use: No   Sexual activity: Not on file  Comment: Husband has had vasectomy  Other Topics Concern   Not on file  Social History Narrative   hhof  4    Pets dog and cat.   Works [employer] [employer] .     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    Has smoke detector and wears seat belts.  No firearms. No excess sun exposure. Sees dentist regularly .          Social Determinants of Health   Financial Resource Strain: Not on file  Food Insecurity: Not on file  Transportation Needs: Not on file  Physical Activity: Not on file  Stress: Not on file  Social Connections: Not on file  Intimate Partner Violence: Not on file      Family History  Problem Relation Age of Onset   Breast cancer Maternal Grandmother 6    Vitals:   10/31/20 1529  BP: 94/67  Pulse: 86  SpO2: 96%  Weight: 66.6 kg (146 lb 12.8 oz)    PHYSICAL EXAM: General:  Well appearing. No resp difficulty HEENT: normal Neck: supple. no JVD. Carotids 2+ bilat; no bruits. No lymphadenopathy or thryomegaly appreciated. Cor: PMI nondisplaced. Regular rate & rhythm. No rubs, gallops or murmurs. + port  Lungs: clear Abdomen: soft, nontender, nondistended. No hepatosplenomegaly. No bruits or masses. Good bowel sounds. Extremities: no cyanosis, clubbing, rash, edema Neuro: alert & orientedx3, cranial nerves grossly intact. moves all 4 extremities w/o difficulty. Affect pleasant  ASSESSMENT &  PLAN:  1. Stage IV bilateral Breast Cancer - 01/2017-05/2017 Chemotherapy TCH (docetaxel/carboplatin) x6 followed by herceptin maintenance - 08/2020 surveillance PET scan showed recurrent/metastatic disease diffuse lymph nodal disease, bony metastasis.  Biopsy confirmed breast carcinoma  - 08/2020 started on FAM-TRASTUZUMAB DERUXTECAN-NXKI [facility]) Q21D - ECHO 01/2017 EF 55-60%, normal GLS -21% - ECHO 10/07/2020 EF 50-55%, normal GLS -18% - Explained incidence of Enhertu cardiotoxicity and role of Cardio-oncology clinic at length. Echo images reviewed personally. All parameters stable. Although EF read slightly lower on most recent echo we reviewed side-by-side and I feel EF is stable  - Continue Enhertu Follow-up with echo in 3 months.   Glori Bickers, MD  11:22 PM

## 2020-11-04 DIAGNOSIS — Z170 Estrogen receptor positive status [ER+]: Secondary | ICD-10-CM | POA: Diagnosis not present

## 2020-11-04 DIAGNOSIS — C50912 Malignant neoplasm of unspecified site of left female breast: Secondary | ICD-10-CM | POA: Diagnosis not present

## 2020-11-04 DIAGNOSIS — C7951 Secondary malignant neoplasm of bone: Secondary | ICD-10-CM | POA: Diagnosis not present

## 2020-11-15 ENCOUNTER — Other Ambulatory Visit: Payer: Self-pay | Admitting: Hematology and Oncology

## 2020-11-18 ENCOUNTER — Other Ambulatory Visit: Payer: Self-pay

## 2020-11-18 ENCOUNTER — Ambulatory Visit (HOSPITAL_COMMUNITY)
Admission: RE | Admit: 2020-11-18 | Discharge: 2020-11-18 | Disposition: A | Discharge status: Home / Self Care | Payer: BC Managed Care – PPO | Source: Ambulatory Visit | Attending: Hematology and Oncology | Admitting: Hematology and Oncology

## 2020-11-18 DIAGNOSIS — Z171 Estrogen receptor negative status [ER-]: Secondary | ICD-10-CM | POA: Diagnosis not present

## 2020-11-18 DIAGNOSIS — C799 Secondary malignant neoplasm of unspecified site: Secondary | ICD-10-CM | POA: Diagnosis not present

## 2020-11-18 DIAGNOSIS — C50812 Malignant neoplasm of overlapping 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 breast: Secondary | ICD-10-CM | POA: Diagnosis not present

## 2020-11-18 DIAGNOSIS — I313 Pericardial effusion (noninflammatory): Secondary | ICD-10-CM | POA: Diagnosis not present

## 2020-11-18 DIAGNOSIS — J929 Pleural plaque without asbestos: Secondary | ICD-10-CM | POA: Diagnosis not present

## 2020-11-18 DIAGNOSIS — C50919 Malignant neoplasm of unspecified site of unspecified female breast: Secondary | ICD-10-CM | POA: Diagnosis not present

## 2020-11-18 MED ORDER — HEPARIN SOD (PORK) LOCK FLUSH 100 UNIT/ML IV SOLN
INTRAVENOUS | Status: AC
  Filled 2020-11-18: qty 5

## 2020-11-18 MED ORDER — HEPARIN SOD (PORK) LOCK FLUSH 100 UNIT/ML IV SOLN
500.0000 [IU] | Freq: Once | INTRAVENOUS | Status: AC
  Administered 2020-11-18: 500 [IU] via INTRAVENOUS

## 2020-11-18 MED ORDER — IOHEXOL 350 MG/ML SOLN
75.0000 mL | Freq: Once | INTRAVENOUS | Status: AC | PRN
  Administered 2020-11-18: 75 mL via INTRAVENOUS

## 2020-11-19 MED FILL — Fosaprepitant Dimeglumine For IV Infusion 150 MG (Base Eq): INTRAVENOUS | Qty: 5 | Status: AC

## 2020-11-19 MED FILL — Dexamethasone Sodium Phosphate Inj 100 MG/10ML: INTRAMUSCULAR | Qty: 1 | Status: AC

## 2020-11-19 NOTE — Progress Notes (Signed)
Patient Care Team: Panosh, Standley Brooking, MD as PCP - General Delsa Bern, MD (Obstetrics and Gynecology) Excell Seltzer, MD (Inactive) as Consulting Physician (General Surgery) Nicholas Lose, MD as Consulting Physician (Hematology and Oncology) Kyung Rudd, MD as Consulting Physician (Radiation Oncology) Gardenia Phlegm, NP as Nurse Practitioner (Hematology and Oncology)  DIAGNOSIS:    ICD-10-CM   1. Malignant neoplasm of overlapping sites of left breast in female, estrogen receptor negative ([place])  C50.812    Z17.1       SUMMARY OF ONCOLOGIC HISTORY: Oncology History  Malignant neoplasm of overlapping 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 breast ([place])  01/12/2017 Surgery   Left mastectomy: IDC with DCIS, 2 tumors 3.6 cm ER 90%, PR 95%, Ki-67 5%, HER-2 negative ratio 1.32) and 1.2 cm (ER 95% PR 95% positive Her 2 Positive ratio 3.2, Ki-67 5%) T2 N0 stage Ib   02/16/2017 -  Chemotherapy   TCH x6 followed by Herceptin maintenance    10/02/2020 Miscellaneous   [facility] molecular test: PD-L1 positive CPS: 10, ER/PR/HER2 negative, MLH1 positive, MSH2 positive, MSH6 positive, MMR proficient, PTEN: Negative   Malignant neoplasm of overlapping sites of right breast in female, estrogen receptor positive ([place])  12/21/2016 Initial Diagnosis   Palpable lumps in the right breast with nipple inversion: Mammogram revealed skin thickening and distortion, ultrasound revealed 4 masses 3.1 cm at 11:00, 2.5 cm at 6:00, 4 cm at 8:00, 2.2 cm at 5:00 with 2 abnormal axillary lymph nodes: MRI revealed 7 cm abnormality on right breast, in addition 3.6 cm abnormality in the left breast and 2 additional masses 1 cm each; right breast: T4 N1 stage III B clinical stage; left breast: T2 N0 stage IB clinical stage   12/21/2016 Pathology Results   Right breast: Grade 1 Invasive lobular cancer, lymph node also positive, ER 70%, PR 90%, Ki-67 10%, HER-2 negative ratio 1.09 Left breast: Grade 1 IDC with DCIS prognostic panel  pending   01/04/2017 Genetic Testing   The patient had genetic testing due to a personal history of bilateral breast caner and a family history of breast cancer.  The Multi-Cancer Panel was ordered. The Multi-Cancer Panel offered by [facility] includes sequencing and/or deletion duplication testing of the following 83 genes: ALK, APC, ATM, AXIN2,BAP1,  BARD1, BLM, BMPR1A, BRCA1, BRCA2, BRIP1, CASR, CDC73, CDH1, CDK4, CDKN1B, CDKN1C, CDKN2A (p14ARF), CDKN2A (p16INK4a), CEBPA, CHEK2, CTNNA1, DICER1, DIS3L2, EGFR (c.2369C>T, p.Thr790Met variant only), EPCAM (Deletion/duplication testing only), FH, FLCN, GATA2, GPC3, GREM1 (Promoter region deletion/duplication testing only), HOXB13 (c.251G>A, p.Gly84Glu), HRAS, KIT, MAX, MEN1, MET, MITF (c.952G>A, p.Glu318Lys variant only), MLH1, MSH2, MSH3, MSH6, MUTYH, NBN, NF1, NF2, NTHL1, PALB2, PDGFRA, PHOX2B, PMS2, POLD1, POLE, POT1, PRKAR1A, PTCH1, PTEN, RAD50, RAD51C, RAD51D, RB1, RECQL4, RET, RUNX1, SDHAF2, SDHA (sequence changes only), SDHB, SDHC, SDHD, SMAD4, SMARCA4, SMARCB1, SMARCE1, STK11, SUFU, TERC, TERT, TMEM127, TP53, TSC1, TSC2, VHL, WRN and WT1.   Results: Negative, no pathogenic variants identified.  The date of this test report is 01/04/2017.     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 the dermis of the skin of the nipple, margins negative, 3/3 lymph nodes positive, grade 1, EF 75%, PR 95%, HER-2 negative ratio 1.19, Ki-67 10%, T3N1A stage IIa   02/16/2017 - 06/09/2017 Chemotherapy   TCH x6 cycles followed by Herceptin maintenance   08/10/2017 - 09/22/2017 Radiation Therapy   Adjuvant radiation   09/24/2017 -  Anti-estrogen oral therapy   Letrozole 2.5 mg daily, changed to Anastrozole over 02/2018 due  to arthralgias   Bilateral breast cancer ([place])  01/12/2017 Initial Diagnosis   Bilateral breast cancer ([place])   09/18/2020 -  Chemotherapy    Patient is on Treatment Plan: BREAST METASTATIC FAM-TRASTUZUMAB  DERUXTECAN-NXKI (ENHERTU) Q21D         CHIEF COMPLIANT: Cycle 4 Enhertu  INTERVAL HISTORY: Amanda Williams is a 55 y.o. with above-mentioned history of recurrent metastatic breast cancer currently on chemotherapy with Enhertu. She reports to the clinic today for cycle 4.  Her major complaint is severe fatigue.  Nausea is under better control.  She had recent CT scans and is here today to discuss results.  Today is cycle 4 of chemotherapy.  ALLERGIES:  is allergic to penicillins, sulfa antibiotics, sulfonamide derivatives, other, and adhesive [tape].  MEDICATIONS:  Current Outpatient Medications  Medication Sig Dispense Refill   acetaminophen (TYLENOL) 500 MG tablet Take 1,000 mg by mouth as needed for moderate pain (for pain/headaches.).      calcium carbonate (CALTRATE 600) 1500 (600 Ca) MG TABS tablet Take 1 tablet (1,500 mg total) by mouth daily with breakfast.  0   diphenoxylate-atropine (LOMOTIL) 2.5-0.025 MG tablet Take 1 tablet by mouth 4 (four) times daily as needed for diarrhea or loose stools. 30 tablet 3   ibuprofen (ADVIL,MOTRIN) 200 MG tablet Take 400 mg by mouth as needed for moderate pain (for pain/headaches).      lidocaine-prilocaine (EMLA) cream Apply to affected area once 30 g 3   LORazepam (ATIVAN) 0.5 MG tablet Take 1 tablet (0.5 mg total) by mouth every 6 (six) hours as needed (Nausea or vomiting). 60 tablet 3   MULTIPLE VITAMIN PO Take 1 tablet by mouth daily.     omeprazole (PRILOSEC) 20 MG capsule Take 20 mg by mouth daily.     ondansetron (ZOFRAN) 8 MG tablet Take 1 tablet (8 mg total) by mouth 2 (two) times daily as needed for refractory nausea / vomiting. Start on day 3 after chemo. 30 tablet 1   prochlorperazine (COMPAZINE) 10 MG tablet Take 1 tablet (10 mg total) by mouth every 6 (six) hours as needed (Nausea or vomiting). 30 tablet 1   sertraline (ZOLOFT) 100 MG tablet Take 1 tablet (100 mg total) by mouth daily. 90 tablet 0   No current facility-administered  medications for this visit.   Facility-Administered Medications Ordered in Other Visits  Medication Dose Route Frequency Provider Last Rate Last Admin   sodium chloride flush (NS) 0.9 % injection 10 mL  10 mL Intracatheter PRN Nicholas Lose, MD   10 mL at 11/20/20 1413    PHYSICAL EXAMINATION: ECOG PERFORMANCE STATUS: 1 - Symptomatic but completely ambulatory  Vitals:   11/20/20 1101  BP: 104/69  Pulse: 93  Resp: 18  Temp: 97.7 F (36.5 C)  SpO2: 98%   Filed Weights   11/20/20 1101  Weight: 146 lb 8 oz (66.5 kg)     LABORATORY DATA:  I have reviewed the data as listed CMP Latest Ref Rng & Units 11/20/2020 10/30/2020 10/09/2020  Glucose 70 - 99 mg/dL 140(H) 105(H) 94  BUN 6 - 20 mg/dL $Remove'13 10 11  'GtgqWbn$ Creatinine 0.44 - 1.00 mg/dL 0.80 0.73 0.72  Sodium 135 - 145 mmol/L 142 140 141  Potassium 3.5 - 5.1 mmol/L 3.6 4.1 4.2  Chloride 98 - 111 mmol/L 107 107 108  CO2 22 - 32 mmol/L $RemoveB'25 25 25  'tMgBHaLD$ Calcium 8.9 - 10.3 mg/dL 9.2 8.9 9.1  Total Protein 6.5 - 8.1 g/dL 7.1 6.9 7.3  Total Bilirubin 0.3 - 1.2 mg/dL 0.4 0.5 0.3  Alkaline Phos 38 - 126 U/L 141(H) 168(H) 187(H)  AST 15 - 41 U/L 34 25 24  ALT 0 - 44 U/L 33 21 20    Lab Results  Component Value Date   WBC 4.9 11/20/2020   HGB 11.6 (L) 11/20/2020   HCT 34.2 (L) 11/20/2020   MCV 89.8 11/20/2020   PLT 299 11/20/2020   NEUTROABS 3.0 11/20/2020    ASSESSMENT & PLAN:  Malignant neoplasm of overlapping 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 breast [facility]) Malignant neoplasm of overlapping 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 breast ([place]) Bilateral breast cancers 01/12/2017: Left mastectomy: IDC with DCIS, 2 tumors 3.6 cm ER 90%, PR 95%, Ki-67 5%, HER-2 negative ratio 1.32) and 1.2 cm (ER 95% PR 95% positive Her 2 Positive ratio 3.2, Ki-67 5%) T2 N0 stage Ib Right mastectomy: Invasive and in situ lobular carcinoma involves the dermis of the skin of the nipple, margins negative, 3/3 lymph nodes positive, grade 1, ER 75%, PR 95%, HER-2 negative ratio 1.19, Ki-67 10%,  T3N1A stage IIa   Treatment plan: 1.  Adjuvant chemotherapy with TCH x 4 cycles completed on 06/09/2017 (Taxotere held from final 2 cycles) followed by Herceptin maintenance for 1 year 2. Adjuvant radiation therapy started 08/10/2017-09/22/2017 3. Followed by adjuvant antiestrogen therapy letrozole started 09/24/2017 switched to anastrozole January 2020 4.  Recurrent/metastatic disease: PET CT scan 08/23/2020: Widespread FDG avid nodal and bone disease suspicious for metastases.  Biopsy: Cervical lymph node: Metastatic breast cancer ER 0%, PR 0%, HER2 positive, Ki-67 15% [facility] molecular testing: PD-L1 positive MMR proficient  _______________________________________________________________________________________ Treatment Plan: Enhertu q 3 weeks, today cycle 3 Bone Mets: Xgeva every 3 months   Enhertu toxicities:  1. Fatigue 2. nausea has resolved 3. Profound hair loss  CT CAP 11/18/2020: Interval decrease in the pretracheal and subcarinal lymph nodes and the anterior mediastinal soft tissue nodule.  Numerous lytic osseous lesions became sclerotic interlobar septal thickening and radiation fibrosis in the right upper lobe  Radiology counseling: I discussed the patient with the results of the scans appear to show response to treatment. Our plan is to perform 3 more cycles of chemo and then obtain a PET CT scan.    No orders of the defined types were placed in this encounter.  The patient has a good understanding of the overall plan. she agrees with it. she will call with any problems that may develop before the next visit here.  Total time spent: 30 mins including face to face time and time spent for planning, charting and coordination of care  Rulon Eisenmenger, MD, MPH 11/20/2020  I, Thana Ates, am acting as scribe for Dr. Nicholas Lose.  I have reviewed the above documentation for accuracy and completeness, and I agree with the above.

## 2020-11-20 ENCOUNTER — Inpatient Hospital Stay: Payer: BC Managed Care – PPO

## 2020-11-20 ENCOUNTER — Other Ambulatory Visit: Payer: Self-pay

## 2020-11-20 ENCOUNTER — Inpatient Hospital Stay (HOSPITAL_BASED_OUTPATIENT_CLINIC_OR_DEPARTMENT_OTHER): Payer: BC Managed Care – PPO | Admitting: Hematology and Oncology

## 2020-11-20 DIAGNOSIS — Z5111 Encounter for antineoplastic chemotherapy: Secondary | ICD-10-CM | POA: Diagnosis not present

## 2020-11-20 DIAGNOSIS — R110 Nausea: Secondary | ICD-10-CM | POA: Diagnosis not present

## 2020-11-20 DIAGNOSIS — Z170 Estrogen receptor positive status [ER+]: Secondary | ICD-10-CM

## 2020-11-20 DIAGNOSIS — C50812 Malignant neoplasm of overlapping 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 breast: Secondary | ICD-10-CM | POA: Diagnosis not present

## 2020-11-20 DIAGNOSIS — C7951 Secondary malignant neoplasm of bone: Secondary | ICD-10-CM | POA: Diagnosis not present

## 2020-11-20 DIAGNOSIS — C50012 Malignant neoplasm of nipple and areola, left female breast: Secondary | ICD-10-CM

## 2020-11-20 DIAGNOSIS — C50011 Malignant neoplasm of nipple and areola, right female breast: Secondary | ICD-10-CM

## 2020-11-20 DIAGNOSIS — Z171 Estrogen receptor negative status [ER-]: Secondary | ICD-10-CM

## 2020-11-20 DIAGNOSIS — R5383 Other fatigue: Secondary | ICD-10-CM | POA: Diagnosis not present

## 2020-11-20 DIAGNOSIS — C50811 Malignant neoplasm of overlapping sites of right female breast: Secondary | ICD-10-CM | POA: Diagnosis not present

## 2020-11-20 DIAGNOSIS — Z923 Personal history of irradiation: Secondary | ICD-10-CM | POA: Diagnosis not present

## 2020-11-20 DIAGNOSIS — Z9221 Personal history of antineoplastic chemotherapy: Secondary | ICD-10-CM | POA: Diagnosis not present

## 2020-11-20 DIAGNOSIS — Z79811 Long term (current) use of aromatase inhibitors: Secondary | ICD-10-CM | POA: Diagnosis not present

## 2020-11-20 DIAGNOSIS — Z9013 Acquired absence of bilateral breasts and nipples: Secondary | ICD-10-CM | POA: Diagnosis not present

## 2020-11-20 DIAGNOSIS — Z5112 Encounter for antineoplastic immunotherapy: Secondary | ICD-10-CM | POA: Diagnosis not present

## 2020-11-20 LAB — CMP (CANCER CENTER ONLY)
ALT: 33 U/L (ref 0–44)
AST: 34 U/L (ref 15–41)
Albumin: 3.7 g/dL (ref 3.5–5.0)
Alkaline Phosphatase: 141 U/L — ABNORMAL HIGH (ref 38–126)
Anion gap: 10 (ref 5–15)
BUN: 13 mg/dL (ref 6–20)
CO2: 25 mmol/L (ref 22–32)
Calcium: 9.2 mg/dL (ref 8.9–10.3)
Chloride: 107 mmol/L (ref 98–111)
Creatinine: 0.8 mg/dL (ref 0.44–1.00)
GFR, Estimated: >60 mL/min (ref >60)
Glucose, Bld: 140 mg/dL — ABNORMAL HIGH (ref 70–99)
Potassium: 3.6 mmol/L (ref 3.5–5.1)
Sodium: 142 mmol/L (ref 135–145)
Total Bilirubin: 0.4 mg/dL (ref 0.3–1.2)
Total Protein: 7.1 g/dL (ref 6.5–8.1)

## 2020-11-20 LAB — CBC WITH DIFFERENTIAL (CANCER CENTER ONLY)
Abs Immature Granulocytes: 0.03 10*3/uL (ref 0.00–0.07)
Basophils Absolute: 0 10*3/uL (ref 0.0–0.1)
Basophils Relative: 1 %
Eosinophils Absolute: 0.1 10*3/uL (ref 0.0–0.5)
Eosinophils Relative: 2 %
HCT: 34.2 % — ABNORMAL LOW (ref 36.0–46.0)
Hemoglobin: 11.6 g/dL — ABNORMAL LOW (ref 12.0–15.0)
Immature Granulocytes: 1 %
Lymphocytes Relative: 26 %
Lymphs Abs: 1.3 10*3/uL (ref 0.7–4.0)
MCH: 30.4 pg (ref 26.0–34.0)
MCHC: 33.9 g/dL (ref 30.0–36.0)
MCV: 89.8 fL (ref 80.0–100.0)
Monocytes Absolute: 0.4 10*3/uL (ref 0.1–1.0)
Monocytes Relative: 9 %
Neutro Abs: 3 10*3/uL (ref 1.7–7.7)
Neutrophils Relative %: 61 %
Platelet Count: 299 10*3/uL (ref 150–400)
RBC: 3.81 MIL/uL — ABNORMAL LOW (ref 3.87–5.11)
RDW: 15 % (ref 11.5–15.5)
WBC Count: 4.9 10*3/uL (ref 4.0–10.5)
nRBC: 0 % (ref 0.0–0.2)

## 2020-11-20 MED ORDER — SODIUM CHLORIDE 0.9% FLUSH
10.0000 mL | INTRAVENOUS | Status: DC | PRN
  Administered 2020-11-20: 10 mL

## 2020-11-20 MED ORDER — SODIUM CHLORIDE 0.9% FLUSH
10.0000 mL | Freq: Once | INTRAVENOUS | Status: AC
Start: 2020-11-20 — End: 2020-11-20
  Administered 2020-11-20: 10 mL

## 2020-11-20 MED ORDER — SODIUM CHLORIDE 0.9 % IV SOLN
150.0000 mg | Freq: Once | INTRAVENOUS | Status: AC
  Administered 2020-11-20: 150 mg via INTRAVENOUS
  Filled 2020-11-20: qty 150

## 2020-11-20 MED ORDER — ACETAMINOPHEN 325 MG PO TABS
650.0000 mg | ORAL_TABLET | Freq: Once | ORAL | Status: AC
  Administered 2020-11-20: 650 mg via ORAL
  Filled 2020-11-20: qty 2

## 2020-11-20 MED ORDER — PALONOSETRON HCL INJECTION 0.25 MG/5ML
0.2500 mg | Freq: Once | INTRAVENOUS | Status: AC
  Administered 2020-11-20: 0.25 mg via INTRAVENOUS
  Filled 2020-11-20: qty 5

## 2020-11-20 MED ORDER — FAM-TRASTUZUMAB DERUXTECAN-NXKI CHEMO 100 MG IV SOLR
4.4500 mg/kg | Freq: Once | INTRAVENOUS | Status: AC
  Administered 2020-11-20: 300 mg via INTRAVENOUS
  Filled 2020-11-20: qty 15

## 2020-11-20 MED ORDER — SODIUM CHLORIDE 0.9 % IV SOLN
10.0000 mg | Freq: Once | INTRAVENOUS | Status: AC
  Administered 2020-11-20: 10 mg via INTRAVENOUS
  Filled 2020-11-20: qty 10

## 2020-11-20 MED ORDER — DEXTROSE 5 % IV SOLN: Freq: Once | INTRAVENOUS | Status: AC

## 2020-11-20 MED ORDER — HEPARIN SOD (PORK) LOCK FLUSH 100 UNIT/ML IV SOLN
500.0000 [IU] | Freq: Once | INTRAVENOUS | Status: AC | PRN
  Administered 2020-11-20: 500 [IU]

## 2020-11-20 NOTE — Patient Instructions (Signed)
[facility]  Discharge Instructions: ?Thank you for choosing [facility] to provide your oncology and hematology care.  ? ?If you have a lab appointment with the Cancer Center, please go directly to the Cancer Center and check in at the registration area. ?  ?Wear comfortable clothing and clothing appropriate for easy access to any Portacath or PICC line.  ? ?We strive to give you quality time with your provider. You may need to reschedule your appointment if you arrive late (15 or more minutes).  Arriving late affects you and other patients whose appointments are after yours.  Also, if you miss three or more appointments without notifying the office, you may be dismissed from the clinic at the provider?s discretion.    ?  ?For prescription refill requests, have your pharmacy contact our office and allow 72 hours for refills to be completed.   ? ?Today you received the following chemotherapy and/or immunotherapy agents: Enhertu.    ?  ?To help prevent nausea and vomiting after your treatment, we encourage you to take your nausea medication as directed. ? ?BELOW ARE SYMPTOMS THAT SHOULD BE REPORTED IMMEDIATELY: ?*FEVER GREATER THAN 100.4 F (38 ?C) OR HIGHER ?*CHILLS OR SWEATING ?*NAUSEA AND VOMITING THAT IS NOT CONTROLLED WITH YOUR NAUSEA MEDICATION ?*UNUSUAL SHORTNESS OF BREATH ?*UNUSUAL BRUISING OR BLEEDING ?*URINARY PROBLEMS (pain or burning when urinating, or frequent urination) ?*BOWEL PROBLEMS (unusual diarrhea, constipation, pain near the anus) ?TENDERNESS IN MOUTH AND THROAT WITH OR WITHOUT PRESENCE OF ULCERS (sore throat, sores in mouth, or a toothache) ?UNUSUAL RASH, SWELLING OR PAIN  ?UNUSUAL VAGINAL DISCHARGE OR ITCHING  ? ?Items with * indicate a potential emergency and should be followed up as soon as possible or go to the Emergency Department if any problems should occur. ? ?Please show the CHEMOTHERAPY ALERT CARD or IMMUNOTHERAPY ALERT CARD at check-in to  the Emergency Department and triage nurse. ? ?Should you have questions after your visit or need to cancel or reschedule your appointment, please contact [facility]  Dept: [phone]  and follow the prompts.  Office hours are 8:00 a.m. to 4:30 p.m. Monday - Friday. Please note that voicemails left after 4:00 p.m. may not be returned until the following business day.  We are closed weekends and major holidays. You have access to a nurse at all times for urgent questions. Please call the main number to the clinic Dept: [phone] and follow the prompts. ? ? ?For any non-urgent questions, you may also contact your provider using MyChart. We now offer e-Visits for anyone 18 and older to request care online for non-urgent symptoms. For details visit [email or web address]. ?  ?Also download the MyChart app! Go to the app store, search "MyChart", open the app, select [facility], and log in with your MyChart username and password. ? ?Due to Covid, a mask is required upon entering the hospital/clinic. If you do not have a mask, one will be given to you upon arrival. For doctor visits, patients may have 1 support person aged 18 or older with them. For treatment visits, patients cannot have anyone with them due to current Covid guidelines and our immunocompromised population.  ? ?

## 2020-11-20 NOTE — Assessment & Plan Note (Signed)
Malignant neoplasm of overlapping 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 breast ([place]) Bilateral breast cancers 01/12/2017:Left mastectomy: IDC with DCIS, 2 tumors 3.6 cm ER 90%, PR 95%, Ki-67 5%, HER-2 negative ratio 1.32) and 1.2 cm (ER 95% PR 95% positive Her 2 Positive ratio 3.2, Ki-67 5%) T2 N0 stage Ib Right mastectomy: 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 Adjuvant chemotherapy with TCH x 4 cycles completed on 06/09/2017 (Taxotere held from final 2 cycles)followed by Herceptin maintenance for 1 year 2. Adjuvant radiation therapystarted 08/10/2017-09/22/2017 3. Followed by adjuvant antiestrogen therapyletrozole started 09/24/2017 switched to anastrozole January 2020 4.Recurrent/metastatic disease: PET CT scan 08/23/2020: Widespread FDG avid nodal and bone disease suspicious for metastases.Biopsy: Cervical lymph node: Metastatic breast cancer ER 0%, PR 0%, HER2 positive, Ki-67 15% Caris molecular testing: PD-L1 positive MMR proficient _______________________________________________________________________________________ Treatment Plan: Enhertu q 3 weeks, today cycle 3 Bone Mets: Xgeva every 3 months  Enhertu toxicities:  1. Fatigue 2. nausea has resolved 3. Profound hair loss  CT CAP 11/18/2020: Interval decrease in the pretracheal and subcarinal lymph nodes and the anterior mediastinal soft tissue nodule.  Numerous lytic osseous lesions became sclerotic interlobar septal thickening and radiation fibrosis in the right upper lobe  Radiology counseling: I discussed the patient with the results of the scans appear to show response to treatment. Our plan is to perform 3 more cycles of chemo and then obtain a PET CT scan.

## 2020-11-21 ENCOUNTER — Encounter: Payer: Self-pay | Admitting: Hematology and Oncology

## 2020-11-21 ENCOUNTER — Other Ambulatory Visit: Payer: Self-pay | Admitting: *Deleted

## 2020-11-21 DIAGNOSIS — R062 Wheezing: Secondary | ICD-10-CM

## 2020-11-21 NOTE — Progress Notes (Signed)
Per MD request, RN successfully placed referral to Dr. Gillermina Phy with [facility] pulmonology for evaluation and treatment of shortness of breath and wheezing.  RN sent staff message to HIM to complete referral placement.

## 2020-11-29 ENCOUNTER — Other Ambulatory Visit: Payer: Self-pay | Admitting: Internal Medicine

## 2020-12-10 ENCOUNTER — Telehealth: Payer: Self-pay | Admitting: Hematology and Oncology

## 2020-12-10 NOTE — Telephone Encounter (Signed)
Sch per 8/31 los,[pt aware

## 2020-12-11 ENCOUNTER — Other Ambulatory Visit: Payer: BC Managed Care – PPO

## 2020-12-11 ENCOUNTER — Ambulatory Visit: Payer: BC Managed Care – PPO | Admitting: Hematology and Oncology

## 2020-12-11 ENCOUNTER — Ambulatory Visit: Payer: BC Managed Care – PPO

## 2020-12-11 MED FILL — Dexamethasone Sodium Phosphate Inj 100 MG/10ML: INTRAMUSCULAR | Qty: 1 | Status: AC

## 2020-12-11 MED FILL — Fosaprepitant Dimeglumine For IV Infusion 150 MG (Base Eq): INTRAVENOUS | Qty: 5 | Status: AC

## 2020-12-11 NOTE — Progress Notes (Signed)
Patient Care Team: Panosh, Standley Brooking, MD as PCP - General Delsa Bern, MD (Obstetrics and Gynecology) Excell Seltzer, MD (Inactive) as Consulting Physician (General Surgery) Nicholas Lose, MD as Consulting Physician (Hematology and Oncology) Kyung Rudd, MD as Consulting Physician (Radiation Oncology) Gardenia Phlegm, NP as Nurse Practitioner (Hematology and Oncology)  DIAGNOSIS:    ICD-10-CM   1. Malignant neoplasm of overlapping sites of left breast in female, estrogen receptor negative ([place])  C50.812    Z17.1       SUMMARY OF ONCOLOGIC HISTORY: Oncology History  Malignant neoplasm of overlapping 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 breast ([place])  01/12/2017 Surgery   Left mastectomy: IDC with DCIS, 2 tumors 3.6 cm ER 90%, PR 95%, Ki-67 5%, HER-2 negative ratio 1.32) and 1.2 cm (ER 95% PR 95% positive Her 2 Positive ratio 3.2, Ki-67 5%) T2 N0 stage Ib   02/16/2017 -  Chemotherapy   TCH x6 followed by Herceptin maintenance    10/02/2020 Miscellaneous   Caris molecular test: PD-L1 positive CPS: 10, ER/PR/HER2 negative, MLH1 positive, MSH2 positive, MSH6 positive, MMR proficient, PTEN: Negative   Malignant neoplasm of overlapping sites of right breast in female, estrogen receptor positive ([place])  12/21/2016 Initial Diagnosis   Palpable lumps in the right breast with nipple inversion: Mammogram revealed skin thickening and distortion, ultrasound revealed 4 masses 3.1 cm at 11:00, 2.5 cm at 6:00, 4 cm at 8:00, 2.2 cm at 5:00 with 2 abnormal axillary lymph nodes: MRI revealed 7 cm abnormality on right breast, in addition 3.6 cm abnormality in the left breast and 2 additional masses 1 cm each; right breast: T4 N1 stage III B clinical stage; left breast: T2 N0 stage IB clinical stage   12/21/2016 Pathology Results   Right breast: Grade 1 Invasive lobular cancer, lymph node also positive, ER 70%, PR 90%, Ki-67 10%, HER-2 negative ratio 1.09 Left breast: Grade 1 IDC with DCIS prognostic panel  pending   01/04/2017 Genetic Testing   The patient had genetic testing due to a personal history of bilateral breast caner and a family history of breast cancer.  The Multi-Cancer Panel was ordered. The Multi-Cancer Panel offered by [facility] includes sequencing and/or deletion duplication testing of the following 83 genes: ALK, APC, ATM, AXIN2,BAP1,  BARD1, BLM, BMPR1A, BRCA1, BRCA2, BRIP1, CASR, CDC73, CDH1, CDK4, CDKN1B, CDKN1C, CDKN2A (p14ARF), CDKN2A (p16INK4a), CEBPA, CHEK2, CTNNA1, DICER1, DIS3L2, EGFR (c.2369C>T, p.Thr790Met variant only), EPCAM (Deletion/duplication testing only), FH, FLCN, GATA2, GPC3, GREM1 (Promoter region deletion/duplication testing only), HOXB13 (c.251G>A, p.Gly84Glu), HRAS, KIT, MAX, MEN1, MET, MITF (c.952G>A, p.Glu318Lys variant only), MLH1, MSH2, MSH3, MSH6, MUTYH, NBN, NF1, NF2, NTHL1, PALB2, PDGFRA, PHOX2B, PMS2, POLD1, POLE, POT1, PRKAR1A, PTCH1, PTEN, RAD50, RAD51C, RAD51D, RB1, RECQL4, RET, RUNX1, SDHAF2, SDHA (sequence changes only), SDHB, SDHC, SDHD, SMAD4, SMARCA4, SMARCB1, SMARCE1, STK11, SUFU, TERC, TERT, TMEM127, TP53, TSC1, TSC2, VHL, WRN and WT1.   Results: Negative, no pathogenic variants identified.  The date of this test report is 01/04/2017.     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 the dermis of the skin of the nipple, margins negative, 3/3 lymph nodes positive, grade 1, EF 75%, PR 95%, HER-2 negative ratio 1.19, Ki-67 10%, T3N1A stage IIa   02/16/2017 - 06/09/2017 Chemotherapy   TCH x6 cycles followed by Herceptin maintenance   08/10/2017 - 09/22/2017 Radiation Therapy   Adjuvant radiation   09/24/2017 -  Anti-estrogen oral therapy   Letrozole 2.5 mg daily, changed to Anastrozole over 02/2018 due  to arthralgias   Bilateral breast cancer ([place])  01/12/2017 Initial Diagnosis   Bilateral breast cancer ([place])   09/18/2020 -  Chemotherapy    Patient is on Treatment Plan: BREAST METASTATIC FAM-TRASTUZUMAB  DERUXTECAN-NXKI (ENHERTU) Q21D         CHIEF COMPLIANT: Cycle 5 Enhertu  INTERVAL HISTORY: Amanda Williams is a 55 y.o. with above-mentioned history of recurrent metastatic breast cancer currently on chemotherapy with Enhertu. She reports to the clinic today for cycle 5.   ALLERGIES:  is allergic to penicillins, sulfa antibiotics, sulfonamide derivatives, other, and adhesive [tape].  MEDICATIONS:  Current Outpatient Medications  Medication Sig Dispense Refill   acetaminophen (TYLENOL) 500 MG tablet Take 1,000 mg by mouth as needed for moderate pain (for pain/headaches.).      calcium carbonate (CALTRATE 600) 1500 (600 Ca) MG TABS tablet Take 1 tablet (1,500 mg total) by mouth daily with breakfast.  0   diphenoxylate-atropine (LOMOTIL) 2.5-0.025 MG tablet Take 1 tablet by mouth 4 (four) times daily as needed for diarrhea or loose stools. 30 tablet 3   ibuprofen (ADVIL,MOTRIN) 200 MG tablet Take 400 mg by mouth as needed for moderate pain (for pain/headaches).      lidocaine-prilocaine (EMLA) cream Apply to affected area once 30 g 3   LORazepam (ATIVAN) 0.5 MG tablet Take 1 tablet (0.5 mg total) by mouth every 6 (six) hours as needed (Nausea or vomiting). 60 tablet 3   MULTIPLE VITAMIN PO Take 1 tablet by mouth daily.     omeprazole (PRILOSEC) 20 MG capsule Take 20 mg by mouth daily.     ondansetron (ZOFRAN) 8 MG tablet Take 1 tablet (8 mg total) by mouth 2 (two) times daily as needed for refractory nausea / vomiting. Start on day 3 after chemo. 30 tablet 1   prochlorperazine (COMPAZINE) 10 MG tablet Take 1 tablet (10 mg total) by mouth every 6 (six) hours as needed (Nausea or vomiting). 30 tablet 1   sertraline (ZOLOFT) 100 MG tablet TAKE 1 TABLET BY MOUTH EVERY DAY 90 tablet 0   No current facility-administered medications for this visit.    PHYSICAL EXAMINATION: ECOG PERFORMANCE STATUS: 1 - Symptomatic but completely ambulatory  There were no vitals filed for this visit. There were  no vitals filed for this visit.  LABORATORY DATA:  I have reviewed the data as listed CMP Latest Ref Rng & Units 11/20/2020 10/30/2020 10/09/2020  Glucose 70 - 99 mg/dL 140(H) 105(H) 94  BUN 6 - 20 mg/dL _0 Creatinine 0.44 - 1.00 mg/dL 0.80 0.73 0.72  Sodium 135 - 145 mmol/L 142 140 141  Potassium 3.5 - 5.1 mmol/L 3.6 4.1 4.2  Chloride 98 - 111 mmol/L 107 107 108  CO2 22 - 32 mmol/L _1 Calcium 8.9 - 10.3 mg/dL 9.2 8.9 9.1  Total Protein 6.5 - 8.1 g/dL 7.1 6.9 7.3  Total Bilirubin 0.3 - 1.2 mg/dL 0.4 0.5 0.3  Alkaline Phos 38 - 126 U/L 141(H) 168(H) 187(H)  AST 15 - 41 U/L 34 25 24  ALT 0 - 44 U/L 33 21 20    Lab Results  Component Value Date   WBC 4.9 11/20/2020   HGB 11.6 (L) 11/20/2020   HCT 34.2 (L) 11/20/2020   MCV 89.8 11/20/2020   PLT 299 11/20/2020   NEUTROABS 3.0 11/20/2020    ASSESSMENT & PLAN:  Malignant neoplasm of overlapping 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 breast [facility]) Bilateral breast cancers 01/12/2017: Left mastectomy: IDC with  DCIS, 2 tumors 3.6 cm ER 90%, PR 95%, Ki-67 5%, HER-2 negative ratio 1.32) and 1.2 cm (ER 95% PR 95% positive Her 2 Positive ratio 3.2, Ki-67 5%) T2 N0 stage Ib Right mastectomy: Invasive and in situ lobular carcinoma involves the dermis of the skin of the nipple, margins negative, 3/3 lymph nodes positive, grade 1, ER 75%, PR 95%, HER-2 negative ratio 1.19, Ki-67 10%, T3N1A stage IIa   Treatment plan: 1.  Adjuvant chemotherapy with TCH x 4 cycles completed on 06/09/2017 (Taxotere held from final 2 cycles) followed by Herceptin maintenance for 1 year 2. Adjuvant radiation therapy started 08/10/2017-09/22/2017 3. Followed by adjuvant antiestrogen therapy letrozole started 09/24/2017 switched to anastrozole January 2020 4.  Recurrent/metastatic disease: PET CT scan 08/23/2020: Widespread FDG avid nodal and bone disease suspicious for metastases.  Biopsy: Cervical lymph node: Metastatic breast cancer ER 0%, PR 0%, HER2 positive, Ki-67 15% Caris  molecular testing: PD-L1 positive MMR proficient  _______________________________________________________________________________________ Treatment Plan: Enhertu q 3 weeks, today cycle 5 Bone Mets: Delton See will be given today   Enhertu toxicities:  1. Fatigue 2. nausea requires nausea medication 3. Profound hair loss  Headaches: Constantly bothering her.  We will obtain a brain MRI stat.   CT CAP 11/18/2020: Interval decrease in the pretracheal and subcarinal lymph nodes and the anterior mediastinal soft tissue nodule.  Numerous lytic osseous lesions became sclerotic interlobar septal thickening and radiation fibrosis in the right upper lobe   Lung: Lymphangitic spread versus drug toxicity due to Enhertu.  I will request Dr. West Carbo to evaluate her. Lump in the throat feeling: I will request radiology to look at her CT scan and see if there is anything that can explain the symptoms.  Our plan is to perform 6 cycles of chemo and then obtain a PET CT scan.    No orders of the defined types were placed in this encounter.  The patient has a good understanding of the overall plan. she agrees with it. she will call with any problems that may develop before the next visit here.  Total time spent: 30 mins including face to face time and time spent for planning, charting and coordination of care  Rulon Eisenmenger, MD, MPH 12/12/2020  I, Thana Ates, am acting as scribe for Dr. Nicholas Lose.  I have reviewed the above documentation for accuracy and completeness, and I agree with the above.

## 2020-12-12 ENCOUNTER — Inpatient Hospital Stay (HOSPITAL_BASED_OUTPATIENT_CLINIC_OR_DEPARTMENT_OTHER): Payer: BC Managed Care – PPO | Admitting: Hematology and Oncology

## 2020-12-12 ENCOUNTER — Inpatient Hospital Stay: Payer: BC Managed Care – PPO | Attending: Hematology and Oncology

## 2020-12-12 ENCOUNTER — Other Ambulatory Visit: Payer: Self-pay

## 2020-12-12 ENCOUNTER — Inpatient Hospital Stay: Payer: BC Managed Care – PPO

## 2020-12-12 VITALS — BP 117/73 | HR 83 | Temp 97.2°F | Resp 18 | Ht 66.0 in | Wt 145.9 lb

## 2020-12-12 DIAGNOSIS — Z170 Estrogen receptor positive status [ER+]: Secondary | ICD-10-CM

## 2020-12-12 DIAGNOSIS — C50812 Malignant neoplasm of overlapping 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 breast: Secondary | ICD-10-CM | POA: Insufficient documentation

## 2020-12-12 DIAGNOSIS — Z5111 Encounter for antineoplastic chemotherapy: Secondary | ICD-10-CM | POA: Insufficient documentation

## 2020-12-12 DIAGNOSIS — C7951 Secondary malignant neoplasm of bone: Secondary | ICD-10-CM | POA: Diagnosis not present

## 2020-12-12 DIAGNOSIS — Z923 Personal history of irradiation: Secondary | ICD-10-CM | POA: Insufficient documentation

## 2020-12-12 DIAGNOSIS — R110 Nausea: Secondary | ICD-10-CM | POA: Diagnosis not present

## 2020-12-12 DIAGNOSIS — Z79811 Long term (current) use of aromatase inhibitors: Secondary | ICD-10-CM | POA: Diagnosis not present

## 2020-12-12 DIAGNOSIS — C50011 Malignant neoplasm of nipple and areola, right female breast: Secondary | ICD-10-CM

## 2020-12-12 DIAGNOSIS — Z5112 Encounter for antineoplastic immunotherapy: Secondary | ICD-10-CM | POA: Insufficient documentation

## 2020-12-12 DIAGNOSIS — Z9013 Acquired absence of bilateral breasts and nipples: Secondary | ICD-10-CM | POA: Diagnosis not present

## 2020-12-12 DIAGNOSIS — R5383 Other fatigue: Secondary | ICD-10-CM | POA: Diagnosis not present

## 2020-12-12 DIAGNOSIS — R519 Headache, unspecified: Secondary | ICD-10-CM

## 2020-12-12 DIAGNOSIS — Z171 Estrogen receptor negative status [ER-]: Secondary | ICD-10-CM | POA: Insufficient documentation

## 2020-12-12 DIAGNOSIS — C50919 Malignant neoplasm of unspecified site of unspecified female breast: Secondary | ICD-10-CM | POA: Diagnosis not present

## 2020-12-12 DIAGNOSIS — Z9221 Personal history of antineoplastic chemotherapy: Secondary | ICD-10-CM | POA: Diagnosis not present

## 2020-12-12 LAB — CMP (CANCER CENTER ONLY)
ALT: 25 U/L (ref 0–44)
AST: 30 U/L (ref 15–41)
Albumin: 3.8 g/dL (ref 3.5–5.0)
Alkaline Phosphatase: 121 U/L (ref 38–126)
Anion gap: 9 (ref 5–15)
BUN: 11 mg/dL (ref 6–20)
CO2: 25 mmol/L (ref 22–32)
Calcium: 9.2 mg/dL (ref 8.9–10.3)
Chloride: 106 mmol/L (ref 98–111)
Creatinine: 0.79 mg/dL (ref 0.44–1.00)
GFR, Estimated: >60 mL/min (ref >60)
Glucose, Bld: 100 mg/dL — ABNORMAL HIGH (ref 70–99)
Potassium: 4 mmol/L (ref 3.5–5.1)
Sodium: 140 mmol/L (ref 135–145)
Total Bilirubin: 0.3 mg/dL (ref 0.3–1.2)
Total Protein: 7.2 g/dL (ref 6.5–8.1)

## 2020-12-12 LAB — CBC WITH DIFFERENTIAL (CANCER CENTER ONLY)
Abs Immature Granulocytes: 0.04 10*3/uL (ref 0.00–0.07)
Basophils Absolute: 0.1 10*3/uL (ref 0.0–0.1)
Basophils Relative: 1 %
Eosinophils Absolute: 0.1 10*3/uL (ref 0.0–0.5)
Eosinophils Relative: 2 %
HCT: 35.2 % — ABNORMAL LOW (ref 36.0–46.0)
Hemoglobin: 11.7 g/dL — ABNORMAL LOW (ref 12.0–15.0)
Immature Granulocytes: 1 %
Lymphocytes Relative: 31 %
Lymphs Abs: 1.5 10*3/uL (ref 0.7–4.0)
MCH: 30.5 pg (ref 26.0–34.0)
MCHC: 33.2 g/dL (ref 30.0–36.0)
MCV: 91.9 fL (ref 80.0–100.0)
Monocytes Absolute: 0.5 10*3/uL (ref 0.1–1.0)
Monocytes Relative: 9 %
Neutro Abs: 2.8 10*3/uL (ref 1.7–7.7)
Neutrophils Relative %: 56 %
Platelet Count: 285 10*3/uL (ref 150–400)
RBC: 3.83 MIL/uL — ABNORMAL LOW (ref 3.87–5.11)
RDW: 15 % (ref 11.5–15.5)
WBC Count: 4.9 10*3/uL (ref 4.0–10.5)
nRBC: 0 % (ref 0.0–0.2)

## 2020-12-12 MED ORDER — DEXTROSE 5 % IV SOLN: Freq: Once | INTRAVENOUS | Status: AC

## 2020-12-12 MED ORDER — SODIUM CHLORIDE 0.9% FLUSH
10.0000 mL | INTRAVENOUS | Status: DC | PRN
  Administered 2020-12-12: 10 mL

## 2020-12-12 MED ORDER — DENOSUMAB 120 MG/1.7ML ~~LOC~~ SOLN: 120.0000 mg | Freq: Once | SUBCUTANEOUS | Status: AC

## 2020-12-12 MED ORDER — HEPARIN SOD (PORK) LOCK FLUSH 100 UNIT/ML IV SOLN
500.0000 [IU] | Freq: Once | INTRAVENOUS | Status: AC | PRN
  Administered 2020-12-12: 500 [IU]

## 2020-12-12 MED ORDER — DEXTROSE 5 % IV SOLN
4.4500 mg/kg | Freq: Once | INTRAVENOUS | Status: AC
  Administered 2020-12-12: 300 mg via INTRAVENOUS
  Filled 2020-12-12: qty 15

## 2020-12-12 MED ORDER — DENOSUMAB 120 MG/1.7ML ~~LOC~~ SOLN
SUBCUTANEOUS | Status: AC
  Administered 2020-12-12: 120 mg via SUBCUTANEOUS
  Filled 2020-12-12: qty 1.7

## 2020-12-12 MED ORDER — SODIUM CHLORIDE 0.9 % IV SOLN
150.0000 mg | Freq: Once | INTRAVENOUS | Status: AC
  Administered 2020-12-12: 150 mg via INTRAVENOUS
  Filled 2020-12-12: qty 150

## 2020-12-12 MED ORDER — PALONOSETRON HCL INJECTION 0.25 MG/5ML
0.2500 mg | Freq: Once | INTRAVENOUS | Status: AC
  Administered 2020-12-12: 0.25 mg via INTRAVENOUS
  Filled 2020-12-12: qty 5

## 2020-12-12 MED ORDER — ACETAMINOPHEN 325 MG PO TABS
650.0000 mg | ORAL_TABLET | Freq: Once | ORAL | Status: AC
  Administered 2020-12-12: 650 mg via ORAL
  Filled 2020-12-12: qty 2

## 2020-12-12 MED ORDER — SODIUM CHLORIDE 0.9% FLUSH
10.0000 mL | Freq: Once | INTRAVENOUS | Status: AC
  Administered 2020-12-12: 10 mL

## 2020-12-12 MED ORDER — SODIUM CHLORIDE 0.9 % IV SOLN
10.0000 mg | Freq: Once | INTRAVENOUS | Status: AC
  Administered 2020-12-12: 10 mg via INTRAVENOUS
  Filled 2020-12-12: qty 10

## 2020-12-12 NOTE — Patient Instructions (Signed)
[facility]  Discharge Instructions: Thank you for choosing [facility] to provide your oncology and hematology care.   If you have a lab appointment with the [facility], please go directly to the [facility] and check in at the registration area.   Wear comfortable clothing and clothing appropriate for easy access to any Portacath or PICC line.   We strive to give you quality time with your provider. You may need to reschedule your appointment if you arrive late (15 or more minutes).  Arriving late affects you and other patients whose appointments are after yours.  Also, if you miss three or more appointments without notifying the office, you may be dismissed from the clinic at the provider's discretion.      For prescription refill requests, have your pharmacy contact our office and allow 72 hours for refills to be completed.    Today you received the following chemotherapy and/or immunotherapy agents Enhertu and xgeva       To help prevent nausea and vomiting after your treatment, we encourage you to take your nausea medication as directed.  BELOW ARE SYMPTOMS THAT SHOULD BE REPORTED IMMEDIATELY: *FEVER GREATER THAN 100.4 F (38 C) OR HIGHER *CHILLS OR SWEATING *NAUSEA AND VOMITING THAT IS NOT CONTROLLED WITH YOUR NAUSEA MEDICATION *UNUSUAL SHORTNESS OF BREATH *UNUSUAL BRUISING OR BLEEDING *URINARY PROBLEMS (pain or burning when urinating, or frequent urination) *BOWEL PROBLEMS (unusual diarrhea, constipation, pain near the anus) TENDERNESS IN MOUTH AND THROAT WITH OR WITHOUT PRESENCE OF ULCERS (sore throat, sores in mouth, or a toothache) UNUSUAL RASH, SWELLING OR PAIN  UNUSUAL VAGINAL DISCHARGE OR ITCHING   Items with * indicate a potential emergency and should be followed up as soon as possible or go to the Emergency Department if any problems should occur.  Please show the CHEMOTHERAPY ALERT CARD or IMMUNOTHERAPY ALERT CARD at  check-in to the Emergency Department and triage nurse.  Should you have questions after your visit or need to cancel or reschedule your appointment, please contact [facility]  Dept: [phone]  and follow the prompts.  Office hours are 8:00 a.m. to 4:30 p.m. Monday - Friday. Please note that voicemails left after 4:00 p.m. may not be returned until the following business day.  We are closed weekends and major holidays. You have access to a nurse at all times for urgent questions. Please call the main number to the clinic Dept: [phone] and follow the prompts.   For any non-urgent questions, you may also contact your provider using MyChart. We now offer e-Visits for anyone 77 and older to request care online for non-urgent symptoms. For details visit [email or web address].   Also download the MyChart app! Go to the app store, search "MyChart", open the app, select [facility], and log in with your MyChart username and password.  Due to Covid, a mask is required upon entering the hospital/clinic. If you do not have a mask, one will be given to you upon arrival. For doctor visits, patients may have 1 support person aged 45 or older with them. For treatment visits, patients cannot have anyone with them due to current Covid guidelines and our immunocompromised population.

## 2020-12-12 NOTE — Assessment & Plan Note (Signed)
Bilateral breast cancers 01/12/2017:Left mastectomy: IDC with DCIS, 2 tumors 3.6 cm ER 90%, PR 95%, Ki-67 5%, HER-2 negative ratio 1.32) and 1.2 cm (ER 95% PR 95% positive Her 2 Positive ratio 3.2, Ki-67 5%) T2 N0 stage Ib Right mastectomy: 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 Adjuvant chemotherapy with TCH x 4 cycles completed on 06/09/2017 (Taxotere held from final 2 cycles)followed by Herceptin maintenance for 1 year 2. Adjuvant radiation therapystarted 08/10/2017-09/22/2017 3. Followed by adjuvant antiestrogen therapyletrozole started 09/24/2017 switched to anastrozole January 2020 4.Recurrent/metastatic disease: PET CT scan 08/23/2020: Widespread FDG avid nodal and bone disease suspicious for metastases.Biopsy: Cervical lymph node: Metastatic breast cancer ER 0%, PR 0%, HER2 positive, Ki-67 15% Caris molecular testing: PD-L1 positive MMR proficient _______________________________________________________________________________________ Treatment Plan: Enhertu q 3 weeks, today cycle4 Bone Mets: Xgeva every 3 months  Enhertu toxicities: 1. Fatigue 2. nausea has resolved 3.Profound hair loss  CT CAP 11/18/2020: Interval decrease in the pretracheal and subcarinal lymph nodes and the anterior mediastinal soft tissue nodule.  Numerous lytic osseous lesions became sclerotic interlobar septal thickening and radiation fibrosis in the right upper lobe  Our plan is to perform 6 cycles of chemo and then obtain a PET CT scan.

## 2020-12-12 NOTE — Progress Notes (Signed)
Per Dr. Lindi Adie; pt can have xgeva today.  Last dose 10/09/20.

## 2020-12-13 ENCOUNTER — Ambulatory Visit
Admission: RE | Admit: 2020-12-13 | Discharge: 2020-12-13 | Disposition: A | Discharge status: Home / Self Care | Payer: BC Managed Care – PPO | Source: Ambulatory Visit | Attending: Hematology and Oncology | Admitting: Hematology and Oncology

## 2020-12-13 DIAGNOSIS — Z171 Estrogen receptor negative status [ER-]: Secondary | ICD-10-CM

## 2020-12-13 DIAGNOSIS — R519 Headache, unspecified: Secondary | ICD-10-CM

## 2020-12-13 DIAGNOSIS — C50919 Malignant neoplasm of unspecified site of unspecified female breast: Secondary | ICD-10-CM

## 2020-12-13 DIAGNOSIS — C50812 Malignant neoplasm of overlapping 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 breast: Secondary | ICD-10-CM

## 2020-12-13 MED ORDER — GADOBENATE DIMEGLUMINE 529 MG/ML IV SOLN
14.0000 mL | Freq: Once | INTRAVENOUS | Status: AC | PRN
  Administered 2020-12-13: 14 mL via INTRAVENOUS

## 2020-12-17 ENCOUNTER — Telehealth: Payer: Self-pay | Admitting: Emergency Medicine

## 2020-12-17 ENCOUNTER — Other Ambulatory Visit: Payer: Self-pay | Admitting: *Deleted

## 2020-12-17 ENCOUNTER — Telehealth: Payer: Self-pay | Admitting: Hematology and Oncology

## 2020-12-17 DIAGNOSIS — I671 Cerebral aneurysm, nonruptured: Secondary | ICD-10-CM

## 2020-12-17 NOTE — Telephone Encounter (Signed)
Spoke with Merleen Nicely- calling regarding consult with RB This has been scheduled  Nothing further needed

## 2020-12-17 NOTE — Progress Notes (Signed)
Per MD request RN successfully placed orders for MRA for aneurysm evaluation. RN will schedule appointment once PA is obtained.

## 2020-12-17 NOTE — Telephone Encounter (Signed)
Scheduled per sch msg. Called and left msg  

## 2020-12-19 ENCOUNTER — Ambulatory Visit (INDEPENDENT_AMBULATORY_CARE_PROVIDER_SITE_OTHER): Payer: BC Managed Care – PPO | Admitting: Emergency Medicine

## 2020-12-19 ENCOUNTER — Encounter: Payer: Self-pay | Admitting: Emergency Medicine

## 2020-12-19 ENCOUNTER — Other Ambulatory Visit: Payer: Self-pay

## 2020-12-19 DIAGNOSIS — R9389 Abnormal findings on diagnostic imaging of other specified body structures: Secondary | ICD-10-CM

## 2020-12-19 NOTE — Assessment & Plan Note (Signed)
Reviewed PET scan and CT with her today.  The anterior mediastinal mass and pretracheal lymphadenopathy appears to be slightly smaller.  I do believe that the pretracheal findings near the sternal notch may be causing her upper neck pressure and discomfort. There is some streaky right upper lobe interstitial scar and bronchial wall thickening in her right sided radiation field.  Also more peripheral area of consolidated lung, probably scar.  No clear evidence for evolving infection or metastatic disease although possible.  Would favor following this on her serial imaging, repeat CT is planned for late October after her seventh cycle of chemotherapy.  If the area is progressing in any way then could consider bronchoscopy for biopsy and culture data.  Certainly if there were active infection present this would change our management.  Not clear to me that finding lymphangitic involvement in the right upper lobe would change Dr. Vela Prose chemotherapy plans.  We will follow-up after the scan to review

## 2020-12-19 NOTE — Patient Instructions (Signed)
We reviewed your CT scan of the chest, PET scan today.  There is some evidence of postradiation interstitial change and bronchial wall thickening in the right upper lobe. We will follow your serial imaging to ensure that there is no interval change in the right upper lobe abnormalities.  If so we can talk to Dr. Lindi Adie about the possible utility of biopsy of this area. Follow with Dr. Lamonte Sakai in early November so we can review your next scan.

## 2020-12-19 NOTE — Progress Notes (Signed)
Subjective:    Patient ID: Amanda Williams, female    DOB: 1965/10/17, 55 y.o.   MRN: [id number]  HPI 55 year old never smoker with a history of breast cancer (2018 mastectomy, chemo, XRT on the R, on anastrozole), kidney stones, allergic rhinitis.  She is referred today for evaluation of her chest imaging She was noted to have metastatic disease on imaging that was done after a fall >> widespread bony mets and also soft tissue opacity in the anterior mediastinum Started chemo, has completed 5 cycles. She has the feeling of pressure and discomfort at her sternal notch. No problems w cough or breathing.   CT chest/abd/pelvis 11/18/20 >> slight interval decrease in size of her matted pretracheal and subcarinal lymphadenopathy, anterior mediastinal mass compared with recent PET scan done on 08/22/2020.  There is some mild bronchial wall thickening particularly in the right upper lobe with some subpleural radiation fibrotic change and area of consolidated scar peripherally.  Repeat CT planned for late October.     Review of Systems As per HPI  Past Medical History:  Diagnosis Date   Allergy    Breast cancer ([place])    Difficult intubation    see 01/12/17 anesthesia record   Family history of breast cancer    Hematuria    ? trigonitis   History of kidney stones    Panic attack    echo 2009 normal     Family History  Problem Relation Age of Onset   Breast cancer Maternal Grandmother 87     Social History   Socioeconomic History   Marital status: Married    Spouse name: Not on file   Number of children: Not on file   Years of education: Not on file   Highest education level: Not on file  Occupational History   Not on file  Tobacco Use   Smoking status: Never   Smokeless tobacco: Never  Vaping Use   Vaping Use: Never used  Substance and Sexual Activity   Alcohol use: Yes    Alcohol/week: 4.0 standard drinks    Types: 4 Glasses of wine per week    Comment: ocassionally    Drug use: No   Sexual activity: Not on file    Comment: Husband has had vasectomy  Other Topics Concern   Not on file  Social History Narrative   hhof  4    Pets dog and cat.   Works [employer] 79 .     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    Has smoke detector and wears seat belts.  No firearms. No excess sun exposure. Sees dentist regularly .          Social Determinants of Health   Financial Resource Strain: Not on file  Food Insecurity: Not on file  Transportation Needs: Not on file  Physical Activity: Not on file  Stress: Not on file  Social Connections: Not on file  Intimate Partner Violence: Not on file     Allergies  Allergen Reactions   Penicillins Hives, Other (See Comments) and Rash    Has patient had a PCN reaction causing immediate rash, facial/tongue/throat swelling, SOB or lightheadedness with hypotension: No HAS PT DEVELOPED SEVERE RASH INVOLVING MUCUS MEMBRANES or SKIN NECROSIS: #  #  #  YES  #  #  #   Has patient had a PCN reaction that required hospitalization: No Has patient had  a PCN reaction occurring within the last 10 years: No If all of the above answers are "NO", then may proceed with Cephalosporin use.  Has patient had a PCN reaction causing immediate rash, facial/tongue/throat swelling, SOB or lightheadedness with hypotension: No HAS PT DEVELOPED SEVERE RASH INVOLVING MUCUS MEMBRANES or SKIN NECROSIS: #  #  #  YES  #  #  #   Has patient had a PCN reaction that required hospitalization: No Has patient had a PCN reaction occurring within the last 10 years: No If all of the above answers are "NO", then may proceed with Cephalosporin use. Has patient had a PCN reaction causing immediate rash, facial/tongue/throat swelling, SOB or lightheadedness with hypotension: No HAS PT DEVELOPED SEVERE RASH INVOLVING MUCUS MEMBRANES or SKIN NECROSIS: #  #  #  YES  #  #  #   Has patient had a PCN reaction that required hospitalization: No Has patient  had a PCN reaction occurring within the last 10 years: No If all of the above answers are "NO", then may proceed with Cephalosporin use.   Sulfa Antibiotics Hives   Sulfonamide Derivatives Hives   Other Other (See Comments)    Redness, rash and blister   Adhesive [Tape] Other (See Comments)    Redness      Outpatient Medications Prior to Visit  Medication Sig Dispense Refill   acetaminophen (TYLENOL) 500 MG tablet Take 1,000 mg by mouth as needed for moderate pain (for pain/headaches.).      calcium carbonate (CALTRATE 600) 1500 (600 Ca) MG TABS tablet Take 1 tablet (1,500 mg total) by mouth daily with breakfast.  0   diphenoxylate-atropine (LOMOTIL) 2.5-0.025 MG tablet Take 1 tablet by mouth 4 (four) times daily as needed for diarrhea or loose stools. 30 tablet 3   ibuprofen (ADVIL,MOTRIN) 200 MG tablet Take 400 mg by mouth as needed for moderate pain (for pain/headaches).      lidocaine-prilocaine (EMLA) cream Apply to affected area once 30 g 3   LORazepam (ATIVAN) 0.5 MG tablet Take 1 tablet (0.5 mg total) by mouth every 6 (six) hours as needed (Nausea or vomiting). 60 tablet 3   MULTIPLE VITAMIN PO Take 1 tablet by mouth daily.     omeprazole (PRILOSEC) 20 MG capsule Take 20 mg by mouth daily.     ondansetron (ZOFRAN) 8 MG tablet Take 1 tablet (8 mg total) by mouth 2 (two) times daily as needed for refractory nausea / vomiting. Start on day 3 after chemo. 30 tablet 1   prochlorperazine (COMPAZINE) 10 MG tablet Take 1 tablet (10 mg total) by mouth every 6 (six) hours as needed (Nausea or vomiting). 30 tablet 1   sertraline (ZOLOFT) 100 MG tablet TAKE 1 TABLET BY MOUTH EVERY DAY 90 tablet 0   No facility-administered medications prior to visit.         Objective:   Physical Exam  Vitals:   12/19/20 1525  BP: 110/70  Pulse: (!) 108  Temp: 98.2 F (36.8 C)  TempSrc: Oral  SpO2: 98%  Weight: 142 lb 3.2 oz (64.5 kg)  Height: 5' 6.5" (1.689 m)   Gen: Pleasant,  well-nourished, in no distress,  normal affect  ENT: No lesions,  mouth clear,  oropharynx clear, no postnasal drip  Neck: No JVD, no stridor  Lungs: No use of accessory muscles, no crackles or wheezing on normal respiration, no wheeze on forced expiration  Cardiovascular: RRR, heart sounds normal, no murmur or gallops, no peripheral edema  Musculoskeletal: No deformities, no cyanosis or clubbing  Neuro: alert, awake, non focal  Skin: Warm, no lesions or rash     Assessment & Plan:   Abnormal CT of the chest Reviewed PET scan and CT with her today.  The anterior mediastinal mass and pretracheal lymphadenopathy appears to be slightly smaller.  I do believe that the pretracheal findings near the sternal notch may be causing her upper neck pressure and discomfort. There is some streaky right upper lobe interstitial scar and bronchial wall thickening in her right sided radiation field.  Also more peripheral area of consolidated lung, probably scar.  No clear evidence for evolving infection or metastatic disease although possible.  Would favor following this on her serial imaging, repeat CT is planned for late October after her seventh cycle of chemotherapy.  If the area is progressing in any way then could consider bronchoscopy for biopsy and culture data.  Certainly if there were active infection present this would change our management.  Not clear to me that finding lymphangitic involvement in the right upper lobe would change Dr. Vela Prose chemotherapy plans.  We will follow-up after the scan to review   Baltazar Apo, MD, PhD 12/19/2020, 5:30 PM [facility] [phone] or if no answer before 7:00PM call [phone] For any issues after 7:00PM please call eLink [phone]

## 2020-12-25 ENCOUNTER — Inpatient Hospital Stay: Payer: BC Managed Care – PPO | Attending: Hematology and Oncology

## 2020-12-25 ENCOUNTER — Ambulatory Visit (HOSPITAL_COMMUNITY)
Admission: RE | Admit: 2020-12-25 | Discharge: 2020-12-25 | Disposition: A | Discharge status: Home / Self Care | Payer: BC Managed Care – PPO | Source: Ambulatory Visit | Attending: Hematology and Oncology | Admitting: Hematology and Oncology

## 2020-12-25 ENCOUNTER — Other Ambulatory Visit: Payer: Self-pay

## 2020-12-25 DIAGNOSIS — C50011 Malignant neoplasm of nipple and areola, right female breast: Secondary | ICD-10-CM

## 2020-12-25 DIAGNOSIS — Z171 Estrogen receptor negative status [ER-]: Secondary | ICD-10-CM | POA: Insufficient documentation

## 2020-12-25 DIAGNOSIS — I671 Cerebral aneurysm, nonruptured: Secondary | ICD-10-CM | POA: Insufficient documentation

## 2020-12-25 DIAGNOSIS — Z9013 Acquired absence of bilateral breasts and nipples: Secondary | ICD-10-CM | POA: Insufficient documentation

## 2020-12-25 DIAGNOSIS — R110 Nausea: Secondary | ICD-10-CM | POA: Insufficient documentation

## 2020-12-25 DIAGNOSIS — Z9221 Personal history of antineoplastic chemotherapy: Secondary | ICD-10-CM | POA: Insufficient documentation

## 2020-12-25 DIAGNOSIS — Z8679 Personal history of other diseases of the circulatory system: Secondary | ICD-10-CM | POA: Diagnosis not present

## 2020-12-25 DIAGNOSIS — Q283 Other malformations of cerebral vessels: Secondary | ICD-10-CM | POA: Diagnosis not present

## 2020-12-25 DIAGNOSIS — C7951 Secondary malignant neoplasm of bone: Secondary | ICD-10-CM | POA: Insufficient documentation

## 2020-12-25 DIAGNOSIS — Z923 Personal history of irradiation: Secondary | ICD-10-CM | POA: Insufficient documentation

## 2020-12-25 DIAGNOSIS — Z79811 Long term (current) use of aromatase inhibitors: Secondary | ICD-10-CM | POA: Insufficient documentation

## 2020-12-25 DIAGNOSIS — R519 Headache, unspecified: Secondary | ICD-10-CM | POA: Insufficient documentation

## 2020-12-25 DIAGNOSIS — C50812 Malignant neoplasm of overlapping 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 breast: Secondary | ICD-10-CM | POA: Insufficient documentation

## 2020-12-25 LAB — CMP (CANCER CENTER ONLY)
ALT: 30 U/L (ref 0–44)
AST: 37 U/L (ref 15–41)
Albumin: 3.8 g/dL (ref 3.5–5.0)
Alkaline Phosphatase: 101 U/L (ref 38–126)
Anion gap: 10 (ref 5–15)
BUN: 10 mg/dL (ref 6–20)
CO2: 25 mmol/L (ref 22–32)
Calcium: 8.6 mg/dL — ABNORMAL LOW (ref 8.9–10.3)
Chloride: 107 mmol/L (ref 98–111)
Creatinine: 0.74 mg/dL (ref 0.44–1.00)
GFR, Estimated: >60 mL/min (ref >60)
Glucose, Bld: 135 mg/dL — ABNORMAL HIGH (ref 70–99)
Potassium: 3 mmol/L — ABNORMAL LOW (ref 3.5–5.1)
Sodium: 142 mmol/L (ref 135–145)
Total Bilirubin: 0.3 mg/dL (ref 0.3–1.2)
Total Protein: 6.9 g/dL (ref 6.5–8.1)

## 2020-12-25 LAB — CBC WITH DIFFERENTIAL (CANCER CENTER ONLY)
Abs Immature Granulocytes: 0.04 10*3/uL (ref 0.00–0.07)
Basophils Absolute: 0 10*3/uL (ref 0.0–0.1)
Basophils Relative: 1 %
Eosinophils Absolute: 0 10*3/uL (ref 0.0–0.5)
Eosinophils Relative: 1 %
HCT: 34 % — ABNORMAL LOW (ref 36.0–46.0)
Hemoglobin: 11.6 g/dL — ABNORMAL LOW (ref 12.0–15.0)
Immature Granulocytes: 1 %
Lymphocytes Relative: 25 %
Lymphs Abs: 1.5 10*3/uL (ref 0.7–4.0)
MCH: 30.5 pg (ref 26.0–34.0)
MCHC: 34.1 g/dL (ref 30.0–36.0)
MCV: 89.5 fL (ref 80.0–100.0)
Monocytes Absolute: 0.4 10*3/uL (ref 0.1–1.0)
Monocytes Relative: 7 %
Neutro Abs: 4.1 10*3/uL (ref 1.7–7.7)
Neutrophils Relative %: 65 %
Platelet Count: 265 10*3/uL (ref 150–400)
RBC: 3.8 MIL/uL — ABNORMAL LOW (ref 3.87–5.11)
RDW: 14.6 % (ref 11.5–15.5)
WBC Count: 6.1 10*3/uL (ref 4.0–10.5)
nRBC: 0 % (ref 0.0–0.2)

## 2020-12-25 MED ORDER — HEPARIN SOD (PORK) LOCK FLUSH 100 UNIT/ML IV SOLN: 500.0000 [IU] | Freq: Once | INTRAVENOUS | Status: DC

## 2020-12-25 MED ORDER — SODIUM CHLORIDE 0.9% FLUSH
10.0000 mL | Freq: Once | INTRAVENOUS | Status: AC
  Administered 2020-12-25: 10 mL

## 2020-12-25 NOTE — Progress Notes (Signed)
Pt stated MRI wants her to be accessed for appt. [facility] was used.

## 2020-12-26 ENCOUNTER — Other Ambulatory Visit: Payer: BC Managed Care – PPO

## 2020-12-26 ENCOUNTER — Encounter: Payer: Self-pay | Admitting: Hematology and Oncology

## 2020-12-26 ENCOUNTER — Encounter: Payer: Self-pay | Admitting: *Deleted

## 2020-12-26 ENCOUNTER — Other Ambulatory Visit: Payer: Self-pay | Admitting: Hematology and Oncology

## 2020-12-26 DIAGNOSIS — I671 Cerebral aneurysm, nonruptured: Secondary | ICD-10-CM

## 2020-12-26 MED ORDER — OXYCODONE-ACETAMINOPHEN 5-325 MG PO TABS: 1.0000 | ORAL_TABLET | Freq: Three times a day (TID) | ORAL | 0 refills | Status: DC | PRN

## 2020-12-26 NOTE — Progress Notes (Signed)
Severe headaches: I sent a prescription for Percocets today.  I will refer the patient to neurosurgery to analyze her MRI and discuss whether it is the cause of her headaches and nausea and vomiting.  She also has an appointment to see Dr. Mickeal Skinner with neuro-oncology.

## 2020-12-26 NOTE — Progress Notes (Signed)
Per MD request, RN successfully faxed referral to Dr. Kathyrn Sheriff [phone]).

## 2020-12-31 DIAGNOSIS — C50919 Malignant neoplasm of unspecified site of unspecified female breast: Secondary | ICD-10-CM | POA: Diagnosis not present

## 2020-12-31 DIAGNOSIS — Z01419 Encounter for gynecological examination (general) (routine) without abnormal findings: Secondary | ICD-10-CM | POA: Diagnosis not present

## 2020-12-31 DIAGNOSIS — Z1211 Encounter for screening for malignant neoplasm of colon: Secondary | ICD-10-CM | POA: Diagnosis not present

## 2020-12-31 DIAGNOSIS — Z3040 Encounter for surveillance of contraceptives, unspecified: Secondary | ICD-10-CM | POA: Diagnosis not present

## 2021-01-01 ENCOUNTER — Other Ambulatory Visit: Payer: BC Managed Care – PPO

## 2021-01-01 ENCOUNTER — Ambulatory Visit: Payer: BC Managed Care – PPO | Admitting: Hematology and Oncology

## 2021-01-01 ENCOUNTER — Ambulatory Visit: Payer: BC Managed Care – PPO

## 2021-01-01 DIAGNOSIS — I671 Cerebral aneurysm, nonruptured: Secondary | ICD-10-CM | POA: Diagnosis not present

## 2021-01-01 DIAGNOSIS — C50919 Malignant neoplasm of unspecified site of unspecified female breast: Secondary | ICD-10-CM | POA: Diagnosis not present

## 2021-01-01 MED FILL — Dexamethasone Sodium Phosphate Inj 100 MG/10ML: INTRAMUSCULAR | Qty: 1 | Status: AC

## 2021-01-01 MED FILL — Fosaprepitant Dimeglumine For IV Infusion 150 MG (Base Eq): INTRAVENOUS | Qty: 5 | Status: AC

## 2021-01-02 ENCOUNTER — Inpatient Hospital Stay: Payer: BC Managed Care – PPO

## 2021-01-02 ENCOUNTER — Other Ambulatory Visit: Payer: Self-pay

## 2021-01-02 ENCOUNTER — Inpatient Hospital Stay: Payer: BC Managed Care – PPO | Admitting: Hematology and Oncology

## 2021-01-02 ENCOUNTER — Other Ambulatory Visit: Payer: Self-pay | Admitting: *Deleted

## 2021-01-02 ENCOUNTER — Inpatient Hospital Stay (HOSPITAL_BASED_OUTPATIENT_CLINIC_OR_DEPARTMENT_OTHER): Payer: BC Managed Care – PPO | Admitting: Internal Medicine

## 2021-01-02 VITALS — BP 107/77 | HR 104 | Temp 98.9°F | Resp 18 | Wt 138.9 lb

## 2021-01-02 DIAGNOSIS — Z79811 Long term (current) use of aromatase inhibitors: Secondary | ICD-10-CM | POA: Diagnosis not present

## 2021-01-02 DIAGNOSIS — C7951 Secondary malignant neoplasm of bone: Secondary | ICD-10-CM | POA: Diagnosis not present

## 2021-01-02 DIAGNOSIS — C50812 Malignant neoplasm of overlapping 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 breast: Secondary | ICD-10-CM | POA: Diagnosis not present

## 2021-01-02 DIAGNOSIS — Z9221 Personal history of antineoplastic chemotherapy: Secondary | ICD-10-CM | POA: Diagnosis not present

## 2021-01-02 DIAGNOSIS — R519 Headache, unspecified: Secondary | ICD-10-CM | POA: Diagnosis not present

## 2021-01-02 DIAGNOSIS — Z171 Estrogen receptor negative status [ER-]: Secondary | ICD-10-CM | POA: Diagnosis not present

## 2021-01-02 DIAGNOSIS — Z9013 Acquired absence of bilateral breasts and nipples: Secondary | ICD-10-CM | POA: Diagnosis not present

## 2021-01-02 DIAGNOSIS — R110 Nausea: Secondary | ICD-10-CM | POA: Diagnosis not present

## 2021-01-02 DIAGNOSIS — I671 Cerebral aneurysm, nonruptured: Secondary | ICD-10-CM | POA: Diagnosis not present

## 2021-01-02 DIAGNOSIS — Z923 Personal history of irradiation: Secondary | ICD-10-CM | POA: Diagnosis not present

## 2021-01-02 MED ORDER — ONDANSETRON HCL 4 MG/2ML IJ SOLN: 8.0000 mg | Freq: Once | INTRAMUSCULAR | Status: AC

## 2021-01-02 MED ORDER — SODIUM CHLORIDE 0.9 % IV SOLN: 8.0000 mg | Freq: Once | INTRAVENOUS | Status: DC

## 2021-01-02 MED ORDER — ONDANSETRON HCL 4 MG/2ML IJ SOLN
INTRAMUSCULAR | Status: AC
  Administered 2021-01-02: 8 mg
  Filled 2021-01-02: qty 4

## 2021-01-02 MED ORDER — HEPARIN SOD (PORK) LOCK FLUSH 100 UNIT/ML IV SOLN
500.0000 [IU] | Freq: Once | INTRAVENOUS | Status: AC
Start: 2021-01-02 — End: 2021-01-02
  Administered 2021-01-02: 500 [IU] via INTRAVENOUS

## 2021-01-02 MED ORDER — SODIUM CHLORIDE 0.9% FLUSH: 10.0000 mL | INTRAVENOUS | Status: DC | PRN

## 2021-01-02 MED ORDER — SODIUM CHLORIDE 0.9% FLUSH
10.0000 mL | Freq: Once | INTRAVENOUS | Status: AC
  Administered 2021-01-02: 10 mL

## 2021-01-02 NOTE — Assessment & Plan Note (Deleted)
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 Adjuvant chemotherapy with TCH x 4 cycles completed on 06/09/2017 (Taxotere held from final 2 cycles)followed by Herceptin maintenance for 1 year 2. Adjuvant radiation therapystarted 08/10/2017-09/22/2017 3. Followed by adjuvant antiestrogen therapyletrozole started 09/24/2017 switched to anastrozole January 2020 4.Recurrent/metastatic disease: PET CT scan 08/23/2020: Widespread FDG avid nodal and bone disease suspicious for metastases.Biopsy: Cervical lymph node: Metastatic breast cancer ER 0%, PR 0%, HER2 positive, Ki-67 15% [facility] molecular testing: PD-L1 positive MMR proficient _______________________________________________________________________________________ Treatment Plan: Enhertu q 3 weeks, today cycle6 Bone Mets: Xgeva will be given today  Enhertu toxicities: 1.Fatigue 2.nausea requires nausea medication 3.Profound hair loss  Headaches: Constantly bothering her.  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 Vaslow is seeing her today.  I also referred her for neurosurgery evaluation.  (Dr.Nundkumar)  CT CAP 11/18/2020: Interval decrease in the pretracheal and subcarinal lymph nodes and the anterior mediastinal soft tissue nodule. Numerous lytic osseous lesions became sclerotic interlobar septal thickening and radiation fibrosis in the right upper lobe  Lung: Lymphangitic spread versus drug toxicity due to Enhertu.    Seeing Dr. Byram: Watchful monitoring Lump in the throat feeling: Radiology does  not think there is any obstruction to the esophagus of the throat.  We will get a referral sent to ENT  Our plan is to perform 6 cycles of chemo and then obtain a PET CT scan. 

## 2021-01-02 NOTE — Progress Notes (Signed)
[facility] at [address], [place] [address] [phone]   New Patient Evaluation  Date of Service: 01/02/21 Patient Name: Amanda Williams Patient MRN: [id number] Patient DOB: 01-31-1966 Provider: Ventura Sellers, MD  Identifying Statement:  Amanda Williams is a 55 y.o. 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    Referring Provider: Burnis Medin, MD [address] [place],  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 ([facility])  01/12/2017 Surgery   Left mastectomy: IDC with DCIS, 2 tumors 3.6 cm ER 90%, PR 95%, Ki-67 5%, HER-2 negative ratio 1.32) and 1.2 cm (ER 95% PR 95% positive Her 2 Positive ratio 3.2, Ki-67 5%) T2 N0 stage Ib   02/16/2017 -  Chemotherapy   TCH x6 followed by Herceptin maintenance    10/02/2020 Miscellaneous   [facility] molecular test: PD-L1 positive CPS: 10, ER/PR/HER2 negative, MLH1 positive, MSH2 positive, MSH6 positive, MMR proficient, PTEN: Negative   Malignant neoplasm of overlapping sites of right breast in female, estrogen receptor positive ([facility])  12/21/2016 Initial Diagnosis   Palpable lumps in the right breast with nipple inversion: Mammogram revealed skin thickening and distortion, ultrasound revealed 4 masses 3.1 cm at 11:00, 2.5 cm at 6:00, 4 cm at 8:00, 2.2 cm at 5:00 with 2 abnormal axillary lymph nodes: MRI revealed 7 cm abnormality on right breast, in addition 3.6 cm abnormality in the left breast and 2 additional masses 1 cm each; right breast: T4 N1 stage III B clinical stage; left breast: T2 N0 stage IB clinical stage   12/21/2016 Pathology Results   Right  breast: Grade 1 Invasive lobular cancer, lymph node also positive, ER 70%, PR 90%, Ki-67 10%, HER-2 negative ratio 1.09 Left breast: Grade 1 IDC with DCIS prognostic panel pending   01/04/2017 Genetic Testing   The patient had genetic testing due to a personal history of bilateral breast caner and a family history of breast cancer.  The Multi-Cancer Panel was ordered. The Multi-Cancer Panel offered by [facility] includes sequencing and/or deletion duplication testing of the following 83 genes: ALK, APC, ATM, AXIN2,BAP1,  BARD1, BLM, BMPR1A, BRCA1, BRCA2, BRIP1, CASR, CDC73, CDH1, CDK4, CDKN1B, CDKN1C, CDKN2A (p14ARF), CDKN2A (p16INK4a), CEBPA, CHEK2, CTNNA1, DICER1, DIS3L2, EGFR (c.2369C>T, p.Thr790Met variant only), EPCAM (Deletion/duplication testing only), FH, FLCN, GATA2, GPC3, GREM1 (Promoter region deletion/duplication testing only), HOXB13 (c.251G>A, p.Gly84Glu), HRAS, KIT, MAX, MEN1, MET, MITF (c.952G>A, p.Glu318Lys variant only), MLH1, MSH2, MSH3, MSH6, MUTYH, NBN, NF1, NF2, NTHL1, PALB2, PDGFRA, PHOX2B, PMS2, POLD1, POLE, POT1, PRKAR1A, PTCH1, PTEN, RAD50, RAD51C, RAD51D, RB1, RECQL4, RET, RUNX1, SDHAF2, SDHA (sequence changes only), SDHB, SDHC, SDHD, SMAD4, SMARCA4, SMARCB1, SMARCE1, STK11, SUFU, TERC, TERT, TMEM127, TP53, TSC1, TSC2, VHL, WRN and WT1.   Results: Negative, no pathogenic variants identified.  The date of this test report is 01/04/2017.     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 the dermis of the skin of the nipple, margins negative, 3/3 lymph nodes positive, grade 1, EF 75%, PR 95%, HER-2 negative ratio 1.19, Ki-67 10%, T3N1A stage IIa   02/16/2017 - 06/09/2017 Chemotherapy   TCH x6 cycles followed by Herceptin maintenance   08/10/2017 -  09/22/2017 Radiation Therapy   Adjuvant radiation   09/24/2017 -  Anti-estrogen oral therapy   Letrozole 2.5 mg daily, changed to Anastrozole over 02/2018 due to arthralgias   Bilateral breast  cancer ([facility])  01/12/2017 Initial Diagnosis   Bilateral breast cancer ([facility])   09/18/2020 -  Chemotherapy    Patient is on Treatment Plan: BREAST METASTATIC FAM-TRASTUZUMAB DERUXTECAN-NXKI (ENHERTU) Q21D         History of Present Illness: The patient's records from the referring physician were obtained and reviewed and the patient interviewed to confirm this HPI.  Amanda Williams presents today to review recent headaches.  She describes 4 weeks history of "stabbing pain behind left eye".  It is almost continuous since that time, day and night, not associated with nausea/vomiting, phonophobia.  No other neurologic features associated with it.  Underwent brain MRI for mets evaluation which showed small aneurysm, met with Dr. Kathyrn Sheriff yesterday.  She recently restarted chemotherapy, Enhertu, now s/p 5 infusions every 3 weeks.  This past week she has felt "sick, tired, nauseated", which began after her most recent treatment.  Tylenol, Motrin, Oxycodone have not helped the headache at all.  Sleep has been normal.    Medications: Current Outpatient Medications on File Prior to Visit  Medication Sig Dispense Refill   acetaminophen (TYLENOL) 500 MG tablet Take 1,000 mg by mouth as needed for moderate pain (for pain/headaches.).      calcium carbonate (CALTRATE 600) 1500 (600 Ca) MG TABS tablet Take 1 tablet (1,500 mg total) by mouth daily with breakfast.  0   ibuprofen (ADVIL,MOTRIN) 200 MG tablet Take 400 mg by mouth as needed for moderate pain (for pain/headaches).      lidocaine-prilocaine (EMLA) cream Apply to affected area once 30 g 3   LORazepam (ATIVAN) 0.5 MG tablet Take 1 tablet (0.5 mg total) by mouth every 6 (six) hours as needed (Nausea or vomiting). 60 tablet 3   MULTIPLE VITAMIN PO Take 1 tablet by mouth daily.     omeprazole (PRILOSEC) 20 MG capsule Take 20 mg by mouth daily.     ondansetron (ZOFRAN) 8 MG tablet Take 1 tablet (8 mg total) by mouth 2 (two) times daily as needed for  refractory nausea / vomiting. Start on day 3 after chemo. 30 tablet 1   oxyCODONE-acetaminophen (PERCOCET/ROXICET) 5-325 MG tablet Take 1 tablet by mouth every 8 (eight) hours as needed for severe pain. 30 tablet 0   prochlorperazine (COMPAZINE) 10 MG tablet Take 1 tablet (10 mg total) by mouth every 6 (six) hours as needed (Nausea or vomiting). 30 tablet 1   sertraline (ZOLOFT) 100 MG tablet TAKE 1 TABLET BY MOUTH EVERY DAY 90 tablet 0   diphenoxylate-atropine (LOMOTIL) 2.5-0.025 MG tablet Take 1 tablet by mouth 4 (four) times daily as needed for diarrhea or loose stools. (Patient not taking: Reported on 01/02/2021) 30 tablet 3   Current Facility-Administered Medications on File Prior to Visit  Medication Dose Route Frequency Provider Last Rate Last Admin   ondansetron (ZOFRAN) injection 8 mg  8 mg Intravenous Once Nicholas Lose, MD        Allergies:  Allergies  Allergen Reactions   Penicillins Hives, Other (See Comments) and Rash    Has patient had a PCN reaction causing immediate rash, facial/tongue/throat swelling, SOB or lightheadedness with hypotension: No HAS PT DEVELOPED SEVERE RASH INVOLVING MUCUS MEMBRANES or SKIN NECROSIS: #  #  #  YES  #  #  #   Has patient  had a PCN reaction that required hospitalization: No Has patient had a PCN reaction occurring within the last 10 years: No If all of the above answers are "NO", then may proceed with Cephalosporin use.  Has patient had a PCN reaction causing immediate rash, facial/tongue/throat swelling, SOB or lightheadedness with hypotension: No HAS PT DEVELOPED SEVERE RASH INVOLVING MUCUS MEMBRANES or SKIN NECROSIS: #  #  #  YES  #  #  #   Has patient had a PCN reaction that required hospitalization: No Has patient had a PCN reaction occurring within the last 10 years: No If all of the above answers are "NO", then may proceed with Cephalosporin use. Has patient had a PCN reaction causing immediate rash, facial/tongue/throat swelling, SOB  or lightheadedness with hypotension: No HAS PT DEVELOPED SEVERE RASH INVOLVING MUCUS MEMBRANES or SKIN NECROSIS: #  #  #  YES  #  #  #   Has patient had a PCN reaction that required hospitalization: No Has patient had a PCN reaction occurring within the last 10 years: No If all of the above answers are "NO", then may proceed with Cephalosporin use.   Sulfa Antibiotics Hives   Sulfonamide Derivatives Hives   Other Other (See Comments)    Redness, rash and blister   Adhesive [Tape] Other (See Comments)    Redness    Past Medical History:  Past Medical History:  Diagnosis Date   Allergy    Breast cancer ([facility])    Difficult intubation    see 01/12/17 anesthesia record   Family history of breast cancer    Hematuria    ? trigonitis   History of kidney stones    Panic attack    echo 2009 normal   Past Surgical History:  Past Surgical History:  Procedure Laterality Date   BREAST BIOPSY     left   BREAST RECONSTRUCTION WITH PLACEMENT OF TISSUE EXPANDER AND FLEX HD (ACELLULAR HYDRATED DERMIS) Bilateral 01/12/2017   Procedure: BREAST RECONSTRUCTION WITH PLACEMENT OF TISSUE EXPANDER AND ALLODERM;  Surgeon: Irene Limbo, MD;  Location: [facility];  Service: Plastics;  Laterality: Bilateral;   COLONOSCOPY     IR IMAGING GUIDED PORT INSERTION  09/17/2020   LIPOSUCTION WITH LIPOFILLING Bilateral 11/02/2017   Procedure: lipofilling from abdomen to bilateral chest;  Surgeon: Irene Limbo, MD;  Location: [facility];  Service: Plastics;  Laterality: Bilateral;   MASTECTOMY W/ SENTINEL NODE BIOPSY Bilateral 01/12/2017   Procedure: RIGHT MODIFIED RADICAL MASTECTOMY, LEFT TOTAL MASTECTOMY WITH LEFT SENTINEL LYMPH NODE BIOPSY;  Surgeon: Rolm Bookbinder, MD;  Location: [facility];  Service: General;  Laterality: Bilateral;   PORTACATH PLACEMENT N/A 02/15/2017   Procedure: INSERTION PORT-A-CATH WITH [place];  Surgeon: Rolm Bookbinder, MD;  Location:  [facility];  Service: General;  Laterality: N/A;   REMOVAL OF TISSUE EXPANDER AND PLACEMENT OF IMPLANT Bilateral 07/16/2017   Procedure: BILATERAL REMOVAL OF TISSUE EXPANDER AND PLACEMENT OF SILICONE IMPLANT;  Surgeon: Irene Limbo, MD;  Location: [facility];  Service: Plastics;  Laterality: Bilateral;   Social History:  Social History   Socioeconomic History   Marital status: Married    Spouse name: Not on file   Number of children: Not on file   Years of education: Not on file   Highest education level: Not on file  Occupational History   Not on file  Tobacco Use   Smoking status: Never   Smokeless tobacco: Never  Vaping Use   Vaping Use:  Never used  Substance and Sexual Activity   Alcohol use: Yes    Alcohol/week: 4.0 standard drinks    Types: 4 Glasses of wine per week    Comment: ocassionally   Drug use: No   Sexual activity: Not on file    Comment: Husband has had vasectomy  Other Topics Concern   Not on file  Social History Narrative   hhof  4    Pets dog and cat.   Works [employer]- [employer] .     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    Has smoke detector and wears seat belts.  No firearms. No excess sun exposure. Sees dentist regularly .          Social Determinants of Health   Financial Resource Strain: Not on file  Food Insecurity: Not on file  Transportation Needs: Not on file  Physical Activity: Not on file  Stress: Not on file  Social Connections: Not on file  Intimate Partner Violence: Not on file   Family History:  Family History  Problem Relation Age of Onset   Breast cancer Maternal Grandmother 87    Review of Systems: Constitutional: Doesn't report fevers, chills or abnormal weight loss Eyes: Doesn't report blurriness of vision Ears, nose, mouth, throat, and face: Doesn't report sore throat Respiratory: Doesn't report cough, dyspnea or wheezes Cardiovascular: Doesn't report palpitation, chest discomfort  Gastrointestinal:  Doesn't report  nausea, constipation, diarrhea GU: Doesn't report incontinence Skin: Doesn't report skin rashes Neurological: Per HPI Musculoskeletal: Doesn't report joint pain Behavioral/Psych: Doesn't report anxiety  Physical Exam: Vitals:   01/02/21 1233  BP: 107/77  Pulse: (!) 104  Resp: 18  Temp: 98.9 F (37.2 C)  SpO2: 95%   KPS: 80. General: Alert, cooperative, pleasant, in no acute distress Head: Normal EENT: No conjunctival injection or scleral icterus.  Lungs: Resp effort normal Cardiac: Regular rate Abdomen: Non-distended abdomen Skin: No rashes cyanosis or petechiae. Extremities: No clubbing or edema  Neurologic Exam: Mental Status: Awake, alert, attentive to examiner. Oriented to self and environment. Language is fluent with intact comprehension.  Cranial Nerves: Visual acuity is grossly normal. Visual fields are full. Extra-ocular movements intact. No ptosis. Face is symmetric Motor: Tone and bulk are normal. Power is full in both arms and legs. Reflexes are symmetric, no pathologic reflexes present.  Sensory: Intact to light touch Gait: Normal.   Labs: I have reviewed the data as listed    Component Value Date/Time   NA 142 12/25/2020 1510   NA 140 03/17/2017 1008   K 3.0 (L) 12/25/2020 1510   K 4.0 03/17/2017 1008   CL 107 12/25/2020 1510   CO2 25 12/25/2020 1510   CO2 22 03/17/2017 1008   GLUCOSE 135 (H) 12/25/2020 1510   GLUCOSE 172 (H) 03/17/2017 1008   BUN 10 12/25/2020 1510   BUN 13.0 03/17/2017 1008   CREATININE 0.74 12/25/2020 1510   CREATININE 0.9 03/17/2017 1008   CALCIUM 8.6 (L) 12/25/2020 1510   CALCIUM 8.5 03/17/2017 1008   PROT 6.9 12/25/2020 1510   PROT 6.9 03/17/2017 1008   ALBUMIN 3.8 12/25/2020 1510   ALBUMIN 3.6 03/17/2017 1008   AST 37 12/25/2020 1510   AST 23 03/17/2017 1008   ALT 30 12/25/2020 1510   ALT 20 03/17/2017 1008   ALKPHOS 101 12/25/2020 1510   ALKPHOS 70 03/17/2017 1008   BILITOT 0.3 12/25/2020 1510   BILITOT  0.34  03/17/2017 1008   GFRNONAA >60 12/25/2020 1510   GFRAA >60 02/02/2018 1340   Lab Results  Component Value Date   WBC 6.1 12/25/2020   NEUTROABS 4.1 12/25/2020   HGB 11.6 (L) 12/25/2020   HCT 34.0 (L) 12/25/2020   MCV 89.5 12/25/2020   PLT 265 12/25/2020    Imaging:  MR ANGIO HEAD WO CONTRAST  Result Date: 12/26/2020 CLINICAL DATA:  Cerebral aneurysm follow-up EXAM: MRA HEAD WITHOUT CONTRAST TECHNIQUE: Angiographic images of the Circle of Willis were acquired using MRA technique without intravenous contrast. COMPARISON:  Brain MRI 12/13/2020 FINDINGS: Anterior circulation: Two 2 mm aneurysms separately projecting anteriorly and superiorly from the anterior communicating artery. CTA or catheter angiography would provide better resolution. No branch occlusion, beading, or flow limiting stenosis. Posterior circulation: Vessels are smooth and diffusely patent with no evidence of aneurysm. Anatomic variants: Hypoplastic right A1 segment. Fetal type left PCA. 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 Electronically Signed   By: Jorje Guild M.D.   On: 12/26/2020 10:24   MR Brain W Wo Contrast  Result Date: 12/13/2020 CLINICAL DATA:  Headache, history of metastatic breast cancer EXAM: MRI HEAD WITHOUT AND WITH CONTRAST TECHNIQUE: Multiplanar, multiecho pulse sequences of the brain and surrounding structures were obtained without and with intravenous contrast. CONTRAST:  65m MULTIHANCE GADOBENATE DIMEGLUMINE 529 MG/ML IV SOLN COMPARISON:  None. FINDINGS: Brain: There is no evidence of acute intracranial hemorrhage, extra-axial fluid collection, or acute infarct. There are scattered small foci of FLAIR signal abnormality in the subcortical and periventricular white matter, entirely nonspecific. There is no suspicious parenchymal signal abnormality. The ventricles are normal in size. There is no mass lesion.  There is no abnormal enhancement. Vascular: Normal flow voids.  There is a small outpouching arising from the left aspect of the anterior communicating artery (17-66). Skull and upper cervical spine: There is T1 hypointensity in the C2 and C3 vertebral bodies on the sagittal T1 images (5-16). Sinuses/Orbits: The paranasal sinuses are clear. The globes and orbits are unremarkable. Other: None. IMPRESSION: 1. No acute intracranial pathology or evidence of intracranial metastatic disease. 2. Abnormal marrow signal in the C2 and C3 vertebral bodies. Given the provided history of metastasic breast cancer, recommend dedicated cervical spine MRI with and without contrast for further evaluation. 3. 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 Recommend CTA or MRA of the head for further evaluation. Electronically Signed   By: PValetta MoleM.D.   On: 12/13/2020 16:54     Assessment/Plan Frequent headaches - Plan: heparin lock flush 100 unit/mL, sodium chloride flush (NS) 0.9 % injection 10 mL  Chronic daily headache  Aneurysm of anterior communicating artery  LHelane Rimapresents with clinical syndrome consistent with chronic daily headache.  There is no medication overuse history, no sleep issue, or other lifestyle complication that would underlie such a syndrome.  The headache is not characteristic of migraine, tension, cluster, or other episodic headache syndrome.  There is suspicion the headaches are a side effect of Enhertu infusion, with cumulative effect taking place after most recent treatment.    Dr. GLindi Adiewill hold this week's treatment; we will follow carefully and expectantly for signs of improvement in the headache.  They will call [facility] set up a follow up if the headaches do not improve within 2 weeks.  For small ACOM aneurysm, will continue to follow with serial imaging only with Dr. NKathyrn Sheriff    We spent twenty additional minutes  teaching regarding the natural history, biology, and historical experience in the treatment of neurologic  complications of cancer.   We appreciate the opportunity to participate in the care of Amanda Williams.   All questions were answered. The patient knows to call the clinic with any problems, questions or concerns. No barriers to learning were detected.  The total time spent in the encounter was 40 minutes and more than 50% was on counseling and review of test results   Ventura Sellers, MD Medical Director of Neuro-Oncology [facility] at [place] 01/02/21 2:02 PM

## 2021-01-02 NOTE — Progress Notes (Signed)
Patient was profusely vomiting during nurse assessment.  Patient is to be seen by Dr Lindi Adie following Dr Mickeal Skinner visit and plan to give infusion.  Patient states she took compazine this morning at home.  Per Dr Lindi Adie give 8mg  zofran IVP.  Assessed port and antiemetic given.

## 2021-01-14 ENCOUNTER — Other Ambulatory Visit: Payer: Self-pay | Admitting: Hematology and Oncology

## 2021-01-14 ENCOUNTER — Other Ambulatory Visit: Payer: Self-pay

## 2021-01-14 ENCOUNTER — Other Ambulatory Visit (HOSPITAL_COMMUNITY)
Admission: RE | Admit: 2021-01-14 | Discharge: 2021-01-14 | Disposition: A | Discharge status: Home / Self Care | Payer: BC Managed Care – PPO | Source: Ambulatory Visit | Attending: Hematology and Oncology | Admitting: Hematology and Oncology

## 2021-01-14 ENCOUNTER — Inpatient Hospital Stay (HOSPITAL_BASED_OUTPATIENT_CLINIC_OR_DEPARTMENT_OTHER): Payer: BC Managed Care – PPO | Admitting: Hematology and Oncology

## 2021-01-14 ENCOUNTER — Telehealth: Payer: Self-pay | Admitting: *Deleted

## 2021-01-14 VITALS — BP 113/83 | HR 111 | Temp 98.3°F | Resp 18

## 2021-01-14 DIAGNOSIS — C50011 Malignant neoplasm of nipple and areola, right female breast: Secondary | ICD-10-CM | POA: Diagnosis not present

## 2021-01-14 DIAGNOSIS — Z9221 Personal history of antineoplastic chemotherapy: Secondary | ICD-10-CM | POA: Diagnosis not present

## 2021-01-14 DIAGNOSIS — C7951 Secondary malignant neoplasm of bone: Secondary | ICD-10-CM | POA: Diagnosis not present

## 2021-01-14 DIAGNOSIS — C50812 Malignant neoplasm of overlapping 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 breast: Secondary | ICD-10-CM | POA: Diagnosis not present

## 2021-01-14 DIAGNOSIS — R519 Headache, unspecified: Secondary | ICD-10-CM

## 2021-01-14 DIAGNOSIS — C50012 Malignant neoplasm of nipple and areola, left female breast: Secondary | ICD-10-CM

## 2021-01-14 DIAGNOSIS — Z923 Personal history of irradiation: Secondary | ICD-10-CM | POA: Diagnosis not present

## 2021-01-14 DIAGNOSIS — Z171 Estrogen receptor negative status [ER-]: Secondary | ICD-10-CM | POA: Diagnosis not present

## 2021-01-14 DIAGNOSIS — Z79811 Long term (current) use of aromatase inhibitors: Secondary | ICD-10-CM | POA: Diagnosis not present

## 2021-01-14 DIAGNOSIS — R112 Nausea with vomiting, unspecified: Secondary | ICD-10-CM

## 2021-01-14 DIAGNOSIS — Z9013 Acquired absence of bilateral breasts and nipples: Secondary | ICD-10-CM | POA: Diagnosis not present

## 2021-01-14 DIAGNOSIS — R110 Nausea: Secondary | ICD-10-CM | POA: Diagnosis not present

## 2021-01-14 LAB — CSF CELL COUNT WITH DIFFERENTIAL
Eosinophils, CSF: 0 % (ref 0–1)
Lymphs, CSF: 33 % — ABNORMAL LOW (ref 40–80)
Monocyte-Macrophage-Spinal Fluid: 62 % — ABNORMAL HIGH (ref 15–45)
RBC Count, CSF: 4 /mm3 — ABNORMAL HIGH
Segmented Neutrophils-CSF: 5 % (ref 0–6)
Tube #: 4
WBC, CSF: 6 /mm3 — ABNORMAL HIGH (ref 0–5)

## 2021-01-14 LAB — GLUCOSE, CSF: Glucose, CSF: 41 mg/dL (ref 40–70)

## 2021-01-14 LAB — PROTEIN, CSF: Total  Protein, CSF: 360 mg/dL — ABNORMAL HIGH (ref 15–45)

## 2021-01-14 MED ORDER — HEPARIN SOD (PORK) LOCK FLUSH 100 UNIT/ML IV SOLN
500.0000 [IU] | Freq: Once | INTRAVENOUS | Status: AC
  Administered 2021-01-14: 500 [IU]

## 2021-01-14 MED ORDER — SODIUM CHLORIDE 0.9 % IV SOLN: INTRAVENOUS | Status: DC

## 2021-01-14 MED ORDER — ONDANSETRON HCL 4 MG/2ML IJ SOLN
8.0000 mg | Freq: Once | INTRAMUSCULAR | Status: AC
  Administered 2021-01-14: 8 mg via INTRAVENOUS
  Filled 2021-01-14: qty 4

## 2021-01-14 MED ORDER — SODIUM CHLORIDE 0.9% FLUSH
10.0000 mL | Freq: Once | INTRAVENOUS | Status: AC
  Administered 2021-01-14: 10 mL

## 2021-01-14 MED ORDER — SODIUM CHLORIDE 0.9 % IV SOLN: 8.0000 mg | Freq: Once | INTRAVENOUS | Status: DC

## 2021-01-14 NOTE — Progress Notes (Signed)
No drainage or bleeding noted at lumbar punture site.  Pt denies pain or a headache.

## 2021-01-14 NOTE — Telephone Encounter (Signed)
Receved call from pt with complaint of severe headache x several days as well as blurry vision.  Pt states headaches are so severe that she is not able to keep any food down and is requesting advice from MD.  Per MD pt needing to be seen in infusion today for evaluation and tx with possible lumbar puncture.  Appt scheduled and pt verbalized understanding of date and time.

## 2021-01-14 NOTE — Progress Notes (Signed)
Lumbar puncture successfully preformed by Dr. Nicholas Lose at 1430.  Pt tolerated procedure well.  Pt educated on home instructions post procedure and verbalized understanding.   Verbal orders received by MD for pt to receive 1 L NS over 2 hours and 8 mg IV Zofran for tx of nausea.  Orders placed.

## 2021-01-14 NOTE — Patient Instructions (Signed)

## 2021-01-15 LAB — CYTOLOGY - NON PAP

## 2021-01-16 ENCOUNTER — Other Ambulatory Visit: Payer: Self-pay | Admitting: *Deleted

## 2021-01-16 ENCOUNTER — Institutional Professional Consult (permissible substitution): Payer: BC Managed Care – PPO | Admitting: Emergency Medicine

## 2021-01-16 ENCOUNTER — Other Ambulatory Visit: Payer: Self-pay

## 2021-01-16 ENCOUNTER — Other Ambulatory Visit: Payer: Self-pay | Admitting: Hematology and Oncology

## 2021-01-16 ENCOUNTER — Other Ambulatory Visit: Payer: Self-pay | Admitting: Radiation Therapy

## 2021-01-16 ENCOUNTER — Inpatient Hospital Stay (HOSPITAL_BASED_OUTPATIENT_CLINIC_OR_DEPARTMENT_OTHER): Payer: BC Managed Care – PPO | Admitting: Hematology and Oncology

## 2021-01-16 ENCOUNTER — Inpatient Hospital Stay: Payer: BC Managed Care – PPO

## 2021-01-16 ENCOUNTER — Other Ambulatory Visit: Payer: Self-pay | Admitting: Pharmacist

## 2021-01-16 VITALS — BP 117/81 | HR 81 | Temp 98.1°F | Resp 18

## 2021-01-16 DIAGNOSIS — Z171 Estrogen receptor negative status [ER-]: Secondary | ICD-10-CM | POA: Diagnosis not present

## 2021-01-16 DIAGNOSIS — C50812 Malignant neoplasm of overlapping 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 breast: Secondary | ICD-10-CM | POA: Diagnosis not present

## 2021-01-16 DIAGNOSIS — C50011 Malignant neoplasm of nipple and areola, right female breast: Secondary | ICD-10-CM

## 2021-01-16 DIAGNOSIS — C50012 Malignant neoplasm of nipple and areola, left female breast: Secondary | ICD-10-CM

## 2021-01-16 LAB — CBC WITH DIFFERENTIAL (CANCER CENTER ONLY)
Abs Immature Granulocytes: 0.12 10*3/uL — ABNORMAL HIGH (ref 0.00–0.07)
Basophils Absolute: 0 10*3/uL (ref 0.0–0.1)
Basophils Relative: 0 %
Eosinophils Absolute: 0 10*3/uL (ref 0.0–0.5)
Eosinophils Relative: 0 %
HCT: 39.6 % (ref 36.0–46.0)
Hemoglobin: 13.1 g/dL (ref 12.0–15.0)
Immature Granulocytes: 1 %
Lymphocytes Relative: 7 %
Lymphs Abs: 0.7 10*3/uL (ref 0.7–4.0)
MCH: 29.9 pg (ref 26.0–34.0)
MCHC: 33.1 g/dL (ref 30.0–36.0)
MCV: 90.4 fL (ref 80.0–100.0)
Monocytes Absolute: 0.4 10*3/uL (ref 0.1–1.0)
Monocytes Relative: 4 %
Neutro Abs: 8.1 10*3/uL — ABNORMAL HIGH (ref 1.7–7.7)
Neutrophils Relative %: 88 %
Platelet Count: 354 10*3/uL (ref 150–400)
RBC: 4.38 MIL/uL (ref 3.87–5.11)
RDW: 15 % (ref 11.5–15.5)
WBC Count: 9.3 10*3/uL (ref 4.0–10.5)
nRBC: 0 % (ref 0.0–0.2)

## 2021-01-16 LAB — CMP (CANCER CENTER ONLY)
ALT: 48 U/L — ABNORMAL HIGH (ref 0–44)
AST: 53 U/L — ABNORMAL HIGH (ref 15–41)
Albumin: 3.6 g/dL (ref 3.5–5.0)
Alkaline Phosphatase: 94 U/L (ref 38–126)
Anion gap: 24 — ABNORMAL HIGH (ref 5–15)
BUN: 14 mg/dL (ref 6–20)
CO2: 15 mmol/L — ABNORMAL LOW (ref 22–32)
Calcium: 8.8 mg/dL — ABNORMAL LOW (ref 8.9–10.3)
Chloride: 106 mmol/L (ref 98–111)
Creatinine: 0.72 mg/dL (ref 0.44–1.00)
GFR, Estimated: >60 mL/min (ref >60)
Glucose, Bld: 149 mg/dL — ABNORMAL HIGH (ref 70–99)
Potassium: 3.1 mmol/L — ABNORMAL LOW (ref 3.5–5.1)
Sodium: 145 mmol/L (ref 135–145)
Total Bilirubin: 0.5 mg/dL (ref 0.3–1.2)
Total Protein: 7.3 g/dL (ref 6.5–8.1)

## 2021-01-16 MED ORDER — SODIUM CHLORIDE 0.9 % IV SOLN: Freq: Once | INTRAVENOUS | Status: AC

## 2021-01-16 MED ORDER — SODIUM CHLORIDE (PF) 0.9 % IJ SOLN
Freq: Once | INTRAMUSCULAR | Status: AC
  Filled 2021-01-16: qty 0.48

## 2021-01-16 MED ORDER — ONDANSETRON HCL 4 MG/2ML IJ SOLN
8.0000 mg | Freq: Once | INTRAMUSCULAR | Status: AC
  Administered 2021-01-16: 8 mg via INTRAVENOUS
  Filled 2021-01-16: qty 4

## 2021-01-16 MED ORDER — SODIUM CHLORIDE 0.9 % IV SOLN: 8.0000 mg | Freq: Once | INTRAVENOUS | Status: DC

## 2021-01-16 MED ORDER — PROMETHAZINE HCL 25 MG RE SUPP
25.0000 mg | Freq: Four times a day (QID) | RECTAL | 3 refills | Status: DC | PRN
Start: 2021-01-16 — End: 2021-01-17

## 2021-01-16 MED ORDER — SODIUM CHLORIDE 0.9% FLUSH
10.0000 mL | Freq: Once | INTRAVENOUS | Status: AC
  Administered 2021-01-16: 10 mL

## 2021-01-16 NOTE — Progress Notes (Signed)
Confirmed pt will get IT MTX at [facility] (Dr. Lindi Adie to administer) & no premeds. No Enhertu today.  Kennith Center, Pharm.D., CPP 01/16/2021@11 :05 AM

## 2021-01-16 NOTE — Progress Notes (Signed)
Lumbar puncture procedure note  Date of Procedure: 01/14/21 Indication: Type of Anesthesia: Local 1% lidocaine  Consent has been obtained from the patient explaining the risks and benefits of the procedure including pain, bleeding and infection. Patient was placed in a sitting position leaning forward arching the back. Patient's name and date of birth were verified. The skin was sterilized with Betadine. 1% lidocaine was used to anesthetize the skin and subcutaneous tissues. The spinal needle was used to enter the L4-L5 intervertebral space and advanced to get clear CSF. Fluid was collected in specimen containers and labeled in the room to be sent for testing. 8 cc of CSF was obtained.The CSF was sent for cell count, cytology, glucose and protein and cultures. Patient will return next week to go over the results of the tests or sooner if the cell count suggests infection.  The needle was removed and pressure was placed the site of needle entry. Small Band-Aid was then placed over the site and patient was made to lay on the back or 15 minutes prior to discharge.   Patient was given instructions on avoiding exertional activities. Patient was instructed to call us if there was any signs and symptoms of bleeding or intractable headaches.  Complications: None Blood loss : None Disposition: Tolerated the procedure well and discharged home.  Signed Harriette Ohara, MD

## 2021-01-16 NOTE — Progress Notes (Signed)
Assisted and verified first time intrathecal methotrexate with Dr. Lindi Adie at bedside. Pt laid flat for 15 minutes. Vital signs taken and pt d/c in stable condition.

## 2021-01-16 NOTE — Patient Instructions (Signed)
[facility] ONCOLOGY  Discharge Instructions: Thank you for choosing [facility] to provide your oncology and hematology care.   If you have a lab appointment with the [facility], please go directly to the [facility] and check in at the registration area.   Wear comfortable clothing and clothing appropriate for easy access to any Portacath or PICC line.   We strive to give you quality time with your provider. You may need to reschedule your appointment if you arrive late (15 or more minutes).  Arriving late affects you and other patients whose appointments are after yours.  Also, if you miss three or more appointments without notifying the office, you may be dismissed from the clinic at the provider's discretion.      For prescription refill requests, have your pharmacy contact our office and allow 72 hours for refills to be completed.    Today you received the following chemotherapy and/or immunotherapy agents methotrexate      To help prevent nausea and vomiting after your treatment, we encourage you to take your nausea medication as directed.  BELOW ARE SYMPTOMS THAT SHOULD BE REPORTED IMMEDIATELY: *FEVER GREATER THAN 100.4 F (38 C) OR HIGHER *CHILLS OR SWEATING *NAUSEA AND VOMITING THAT IS NOT CONTROLLED WITH YOUR NAUSEA MEDICATION *UNUSUAL SHORTNESS OF BREATH *UNUSUAL BRUISING OR BLEEDING *URINARY PROBLEMS (pain or burning when urinating, or frequent urination) *BOWEL PROBLEMS (unusual diarrhea, constipation, pain near the anus) TENDERNESS IN MOUTH AND THROAT WITH OR WITHOUT PRESENCE OF ULCERS (sore throat, sores in mouth, or a toothache) UNUSUAL RASH, SWELLING OR PAIN  UNUSUAL VAGINAL DISCHARGE OR ITCHING   Items with * indicate a potential emergency and should be followed up as soon as possible or go to the Emergency Department if any problems should occur.  Please show the CHEMOTHERAPY ALERT CARD or IMMUNOTHERAPY ALERT CARD at check-in  to the Emergency Department and triage nurse.  Should you have questions after your visit or need to cancel or reschedule your appointment, please contact [facility]  Dept: [phone]  and follow the prompts.  Office hours are 8:00 a.m. to 4:30 p.m. Monday - Friday. Please note that voicemails left after 4:00 p.m. may not be returned until the following business day.  We are closed weekends and major holidays. You have access to a nurse at all times for urgent questions. Please call the main number to the clinic Dept: [phone] and follow the prompts.   For any non-urgent questions, you may also contact your provider using MyChart. We now offer e-Visits for anyone 42 and older to request care online for non-urgent symptoms. For details visit [email or web address].   Also download the MyChart app! Go to the app store, search "MyChart", open the app, select [facility], and log in with your MyChart username and password.  Due to Covid, a mask is required upon entering the hospital/clinic. If you do not have a mask, one will be given to you upon arrival. For doctor visits, patients may have 1 support person aged 16 or older with them. For treatment visits, patients cannot have anyone with them due to current Covid guidelines and our immunocompromised population.

## 2021-01-16 NOTE — Progress Notes (Addendum)
Patient Care Team: Panosh, Standley Brooking, MD as PCP - General Delsa Bern, MD (Obstetrics and Gynecology) Excell Seltzer, MD (Inactive) as Consulting Physician (General Surgery) Nicholas Lose, MD as Consulting Physician (Hematology and Oncology) Kyung Rudd, MD as Consulting Physician (Radiation Oncology) Delice Bison Charlestine Massed, NP as Nurse Practitioner (Hematology and Oncology)  DIAGNOSIS:  Encounter Diagnosis  Name Primary?   Malignant neoplasm of overlapping sites of left breast in female, estrogen receptor negative ([place])     SUMMARY OF ONCOLOGIC HISTORY: Oncology History  Malignant neoplasm of overlapping 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 breast ([place])  01/12/2017 Surgery   Left mastectomy: IDC with DCIS, 2 tumors 3.6 cm ER 90%, PR 95%, Ki-67 5%, HER-2 negative ratio 1.32) and 1.2 cm (ER 95% PR 95% positive Her 2 Positive ratio 3.2, Ki-67 5%) T2 N0 stage Ib   02/16/2017 -  Chemotherapy   TCH x6 followed by Herceptin maintenance    10/02/2020 Miscellaneous   [facility] molecular test: PD-L1 positive CPS: 10, ER/PR/HER2 negative, MLH1 positive, MSH2 positive, MSH6 positive, MMR proficient, PTEN: Negative   Malignant neoplasm of overlapping sites of right breast in female, estrogen receptor positive ([place])  12/21/2016 Initial Diagnosis   Palpable lumps in the right breast with nipple inversion: Mammogram revealed skin thickening and distortion, ultrasound revealed 4 masses 3.1 cm at 11:00, 2.5 cm at 6:00, 4 cm at 8:00, 2.2 cm at 5:00 with 2 abnormal axillary lymph nodes: MRI revealed 7 cm abnormality on right breast, in addition 3.6 cm abnormality in the left breast and 2 additional masses 1 cm each; right breast: T4 N1 stage III B clinical stage; left breast: T2 N0 stage IB clinical stage   12/21/2016 Pathology Results   Right breast: Grade 1 Invasive lobular cancer, lymph node also positive, ER 70%, PR 90%, Ki-67 10%, HER-2 negative ratio 1.09 Left breast: Grade 1 IDC with DCIS prognostic panel  pending   01/04/2017 Genetic Testing   The patient had genetic testing due to a personal history of bilateral breast caner and a family history of breast cancer.  The Multi-Cancer Panel was ordered. The Multi-Cancer Panel offered by [facility] includes sequencing and/or deletion duplication testing of the following 83 genes: ALK, APC, ATM, AXIN2,BAP1,  BARD1, BLM, BMPR1A, BRCA1, BRCA2, BRIP1, CASR, CDC73, CDH1, CDK4, CDKN1B, CDKN1C, CDKN2A (p14ARF), CDKN2A (p16INK4a), CEBPA, CHEK2, CTNNA1, DICER1, DIS3L2, EGFR (c.2369C>T, p.Thr790Met variant only), EPCAM (Deletion/duplication testing only), FH, FLCN, GATA2, GPC3, GREM1 (Promoter region deletion/duplication testing only), HOXB13 (c.251G>A, p.Gly84Glu), HRAS, KIT, MAX, MEN1, MET, MITF (c.952G>A, p.Glu318Lys variant only), MLH1, MSH2, MSH3, MSH6, MUTYH, NBN, NF1, NF2, NTHL1, PALB2, PDGFRA, PHOX2B, PMS2, POLD1, POLE, POT1, PRKAR1A, PTCH1, PTEN, RAD50, RAD51C, RAD51D, RB1, RECQL4, RET, RUNX1, SDHAF2, SDHA (sequence changes only), SDHB, SDHC, SDHD, SMAD4, SMARCA4, SMARCB1, SMARCE1, STK11, SUFU, TERC, TERT, TMEM127, TP53, TSC1, TSC2, VHL, WRN and WT1.   Results: Negative, no pathogenic variants identified.  The date of this test report is 01/04/2017.     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 the dermis of the skin of the nipple, margins negative, 3/3 lymph nodes positive, grade 1, EF 75%, PR 95%, HER-2 negative ratio 1.19, Ki-67 10%, T3N1A stage IIa   02/16/2017 - 06/09/2017 Chemotherapy   TCH x6 cycles followed by Herceptin maintenance   08/10/2017 - 09/22/2017 Radiation Therapy   Adjuvant radiation   09/24/2017 -  Anti-estrogen oral therapy   Letrozole 2.5 mg daily, changed to Anastrozole over 02/2018 due to arthralgias   Bilateral breast cancer (  [place])  01/12/2017 Initial Diagnosis   Bilateral breast cancer ([place])   09/18/2020 -  Chemotherapy   Patient is on Treatment Plan : BREAST METASTATIC fam-trastuzumab  deruxtecan-nxki (Enhertu) q21d       CHIEF COMPLIANT: Follow-up to discuss results of lumbar puncture  INTERVAL HISTORY: Amanda Williams is a 55 year old with above-mentioned metastatic breast cancer who has been on Enhertu and over the past 3 weeks.  Intractable headaches and difficulty with eating and everything she eats she throws up.  She is unable to keep fluids or even pills down.  We performed lumbar puncture couple of days ago and is here today to discuss results.  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  She has been getting weaker and weaker over the past several weeks to the point that she has been sleeping all day at home.   ALLERGIES:  is allergic to penicillins, sulfa antibiotics, sulfonamide derivatives, other, and adhesive [tape].  MEDICATIONS:  Current Outpatient Medications  Medication Sig Dispense Refill   acetaminophen (TYLENOL) 500 MG tablet Take 1,000 mg by mouth as needed for moderate pain (for pain/headaches.).      calcium carbonate (CALTRATE 600) 1500 (600 Ca) MG TABS tablet Take 1 tablet (1,500 mg total) by mouth daily with breakfast.  0   diphenoxylate-atropine (LOMOTIL) 2.5-0.025 MG tablet Take 1 tablet by mouth 4 (four) times daily as needed for diarrhea or loose stools. (Patient not taking: Reported on 01/02/2021) 30 tablet 3   ibuprofen (ADVIL,MOTRIN) 200 MG tablet Take 400 mg by mouth as needed for moderate pain (for pain/headaches).      lidocaine-prilocaine (EMLA) cream Apply to affected area once 30 g 3   LORazepam (ATIVAN) 0.5 MG tablet Take 1 tablet (0.5 mg total) by mouth every 6 (six) hours as needed (Nausea or vomiting). 60 tablet 3   MULTIPLE VITAMIN PO Take 1 tablet by mouth daily.     omeprazole (PRILOSEC) 20 MG capsule Take 20 mg by mouth daily.     ondansetron (ZOFRAN) 8 MG tablet Take 1 tablet (8 mg total) by mouth 2 (two) times daily as needed for refractory nausea / vomiting. Start on day 3 after chemo. 30  tablet 1   oxyCODONE-acetaminophen (PERCOCET/ROXICET) 5-325 MG tablet Take 1 tablet by mouth every 8 (eight) hours as needed for severe pain. 30 tablet 0   prochlorperazine (COMPAZINE) 10 MG tablet Take 1 tablet (10 mg total) by mouth every 6 (six) hours as needed (Nausea or vomiting). 30 tablet 1   sertraline (ZOLOFT) 100 MG tablet TAKE 1 TABLET BY MOUTH EVERY DAY 90 tablet 0   No current facility-administered medications for this visit.   Facility-Administered Medications Ordered in Other Visits  Medication Dose Route Frequency Provider Last Rate Last Admin   0.9 %  sodium chloride infusion   Intravenous Once Nicholas Lose, MD 999 mL/hr at 01/16/21 1509 New Bag at 01/16/21 1509   methotrexate (PF) 12 mg in sodium chloride (PF) 0.9 % INTRATHECAL chemo injection   Intrathecal Once Nicholas Lose, MD       ondansetron (ZOFRAN) injection 8 mg  8 mg Intravenous Once Nicholas Lose, MD       ondansetron (ZOFRAN) injection 8 mg  8 mg Intravenous Once Nicholas Lose, MD        PHYSICAL EXAMINATION: ECOG PERFORMANCE STATUS: 4 - Bedbound  Vitals:   01/16/21 1437  BP: 132/75  Pulse: 76  Resp: 18  Temp: (!) 97.2 F (36.2 C)  SpO2: 95%  Filed Weights   01/16/21 1437  Weight: 129 lb 8 oz (58.7 kg)      LABORATORY DATA:  I have reviewed the data as listed CMP Latest Ref Rng & Units 01/16/2021 12/25/2020 12/12/2020  Glucose 70 - 99 mg/dL 149(H) 135(H) 100(H)  BUN 6 - 20 mg/dL _0 Creatinine 0.44 - 1.00 mg/dL 0.72 0.74 0.79  Sodium 135 - 145 mmol/L 145 142 140  Potassium 3.5 - 5.1 mmol/L 3.1(L) 3.0(L) 4.0  Chloride 98 - 111 mmol/L 106 107 106  CO2 22 - 32 mmol/L 15(L) 25 25  Calcium 8.9 - 10.3 mg/dL 8.8(L) 8.6(L) 9.2  Total Protein 6.5 - 8.1 g/dL 7.3 6.9 7.2  Total Bilirubin 0.3 - 1.2 mg/dL 0.5 0.3 0.3  Alkaline Phos 38 - 126 U/L 94 101 121  AST 15 - 41 U/L 53(H) 37 30  ALT 0 - 44 U/L 48(H) 30 25    Lab Results  Component Value Date   WBC 9.3 01/16/2021   HGB 13.1 01/16/2021    HCT 39.6 01/16/2021   MCV 90.4 01/16/2021   PLT 354 01/16/2021   NEUTROABS 8.1 (H) 01/16/2021    ASSESSMENT & PLAN:  Malignant neoplasm of overlapping 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 breast ([place]) 01/12/2017: Left mastectomy: IDC with DCIS, 2 tumors 3.6 cm ER 90%, PR 95%, Ki-67 5%, HER-2 negative ratio 1.32) and 1.2 cm (ER 95% PR 95% positive Her 2 Positive ratio 3.2, Ki-67 5%) T2 N0 stage Ib Right mastectomy: Invasive and in situ lobular carcinoma involves the dermis of the skin of the nipple, margins negative, 3/3 lymph nodes positive, grade 1, ER 75%, PR 95%, HER-2 negative ratio 1.19, Ki-67 10%, T3N1A stage IIa   Treatment plan: 1.  Adjuvant chemotherapy with TCH x 4 cycles completed on 06/09/2017 (Taxotere held from final 2 cycles) followed by Herceptin maintenance for 1 year 2. Adjuvant radiation therapy started 08/10/2017-09/22/2017 3. Followed by adjuvant antiestrogen therapy letrozole started 09/24/2017 switched to anastrozole January 2020 4.  Recurrent/metastatic disease: PET CT scan 08/23/2020: Widespread FDG avid nodal and bone disease suspicious for metastases.  Biopsy: Cervical lymph node: Metastatic breast cancer ER 0%, PR 0%, HER2 positive, Ki-67 15% [facility] molecular testing: PD-L1 positive MMR proficient  _______________________________________________________________________________________ Treatment Plan:  Enhertu q 3 weeks (we tried to see if she could take a Tucatinib but because she cannot keep anything down, I do not think it is a good idea at this time) Intrathecal methotrexate treatments We will request her neurosurgery for Ommaya reservoir placement.     CT CAP 11/18/2020: Interval decrease in the pretracheal and subcarinal lymph nodes and the anterior mediastinal soft tissue nodule.  Numerous lytic osseous lesions became sclerotic interlobar septal thickening and radiation fibrosis in the right upper lobe   Lung: Lymphangitic spread versus drug toxicity due to Enhertu. Dr.Byram  evaluated her and felt that she could be monitored for now.    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  I discussed with her that this indicates a poor prognosis and that we may have to treat her with intrathecal chemotherapy with or without intrathecal Herceptin.  We discussed that CNS can be impermeable to standard treatments.  The other option would be to switch her to Tucatinib.  But because she cannot keep anything down, I do not recommend any oral regimen at this time.    Ommaya reservoir placement will be requested. We will start intrathecal treatments  Return to clinic weekly for these treatments.  No orders of  the defined types were placed in this encounter.  The patient has a good understanding of the overall plan. she agrees with it. she will call with any problems that may develop before the next visit here. Total time spent: 30 mins including face to face time and time spent for planning, charting and co-ordination of care   Harriette Ohara, MD 01/16/21   ADDENDUM Lumbar puncture procedure note  Date of Procedure: 01/16/21 Indication: Type of Anesthesia: Local 1% lidocaine  Chemo was verified before starting the procedure and the call out was performed  Consent has been obtained from the patient explaining the risks and benefits of the procedure including pain, bleeding and infection. Patient was placed in a sitting position leaning forward arching the back. Patient's name and date of birth were verified. The skin was sterilized with Betadine. 1% lidocaine was used to anesthetize the skin and subcutaneous tissues. The spinal needle was used to enter the L4-L5 intervertebral space and advanced to get clear CSF. Fluid was collected in specimen containers and labeled in the room to be sent for testing. 5 cc of CSF was obtained.   Intrathecal methotrexate was infused slowly over 5 minutes.  The needle was removed and pressure was placed the site of  needle entry. Small Band-Aid was then placed over the site and patient was made to lay on the back or 15 minutes prior to discharge.   Patient was given instructions on avoiding exertional activities. Patient was instructed to call us if there was any signs and symptoms of bleeding or intractable headaches.  Complications: None Blood loss : None Disposition: Tolerated the procedure well and discharged home.  Signed Harriette Ohara, MD

## 2021-01-16 NOTE — Assessment & Plan Note (Signed)
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 Adjuvant chemotherapy with TCH x 4 cycles completed on 06/09/2017 (Taxotere held from final 2 cycles)followed by Herceptin maintenance for 1 year 2. Adjuvant radiation therapystarted 08/10/2017-09/22/2017 3. Followed by adjuvant antiestrogen therapyletrozole started 09/24/2017 switched to anastrozole January 2020 4.Recurrent/metastatic disease: PET CT scan 08/23/2020: Widespread FDG avid nodal and bone disease suspicious for metastases.Biopsy: Cervical lymph node: Metastatic breast cancer ER 0%, PR 0%, HER2 positive, Ki-67 15% [facility] molecular testing: PD-L1 positive MMR proficient _______________________________________________________________________________________ Treatment Plan: Enhertu q 3 weeks, today cycle5 Bone Mets: Delton See will be given today  Enhertu toxicities: 1.Fatigue 2.nausea requires nausea medication 3.Profound hair loss  Headaches: Constantly bothering her.  We will obtain a brain MRI stat.  CT CAP 11/18/2020: Interval decrease in the pretracheal and subcarinal lymph nodes and the anterior mediastinal soft tissue nodule. Numerous lytic osseous lesions became sclerotic interlobar septal thickening and radiation fibrosis in the right upper lobe  Lung: Lymphangitic spread versus drug toxicity due to Enhertu. Dr.Byram evaluated her and felt that she could be monitored for now. 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  I discussed with her that this indicates a poor prognosis and  that we may have to treat her with intrathecal chemotherapy with or without intrathecal Herceptin.  We discussed that CNS can be impermeable to standard treatments.  The other option would be to switch her to Tucatinib. We would like to perform a PET CT scan for further evaluation and then make a determination on changing the systemic treatment.  Ommaya reservoir placement will be requested. We will start intrathecal treatments

## 2021-01-17 ENCOUNTER — Other Ambulatory Visit: Payer: Self-pay | Admitting: Neurosurgery

## 2021-01-17 ENCOUNTER — Encounter: Payer: Self-pay | Admitting: Hematology and Oncology

## 2021-01-17 ENCOUNTER — Other Ambulatory Visit: Payer: Self-pay | Admitting: *Deleted

## 2021-01-17 MED ORDER — PROMETHAZINE HCL 25 MG RE SUPP: 25.0000 mg | Freq: Four times a day (QID) | RECTAL | 0 refills | Status: DC | PRN

## 2021-01-18 ENCOUNTER — Inpatient Hospital Stay (HOSPITAL_COMMUNITY)
Admission: EM | Admit: 2021-01-18 | Discharge: 2021-01-20 | DRG: 055 | Disposition: A | Discharge status: Hospice | Payer: BC Managed Care – PPO | Attending: Internal Medicine | Admitting: Internal Medicine

## 2021-01-18 ENCOUNTER — Emergency Department (HOSPITAL_COMMUNITY): Payer: BC Managed Care – PPO

## 2021-01-18 ENCOUNTER — Observation Stay (HOSPITAL_COMMUNITY): Payer: BC Managed Care – PPO

## 2021-01-18 ENCOUNTER — Other Ambulatory Visit: Payer: Self-pay

## 2021-01-18 ENCOUNTER — Encounter (HOSPITAL_COMMUNITY): Payer: Self-pay

## 2021-01-18 DIAGNOSIS — F419 Anxiety disorder, unspecified: Secondary | ICD-10-CM | POA: Diagnosis not present

## 2021-01-18 DIAGNOSIS — Z888 Allergy status to other drugs, medicaments and biological substances status: Secondary | ICD-10-CM

## 2021-01-18 DIAGNOSIS — K219 Gastro-esophageal reflux disease without esophagitis: Secondary | ICD-10-CM | POA: Diagnosis present

## 2021-01-18 DIAGNOSIS — Z170 Estrogen receptor positive status [ER+]: Secondary | ICD-10-CM

## 2021-01-18 DIAGNOSIS — Z882 Allergy status to sulfonamides status: Secondary | ICD-10-CM

## 2021-01-18 DIAGNOSIS — Z79899 Other long term (current) drug therapy: Secondary | ICD-10-CM

## 2021-01-18 DIAGNOSIS — R410 Disorientation, unspecified: Secondary | ICD-10-CM | POA: Diagnosis not present

## 2021-01-18 DIAGNOSIS — M4316 Spondylolisthesis, lumbar region: Secondary | ICD-10-CM | POA: Diagnosis not present

## 2021-01-18 DIAGNOSIS — Z880 Allergy status to penicillin: Secondary | ICD-10-CM

## 2021-01-18 DIAGNOSIS — C7951 Secondary malignant neoplasm of bone: Secondary | ICD-10-CM | POA: Diagnosis not present

## 2021-01-18 DIAGNOSIS — R569 Unspecified convulsions: Secondary | ICD-10-CM | POA: Diagnosis present

## 2021-01-18 DIAGNOSIS — R0902 Hypoxemia: Secondary | ICD-10-CM | POA: Diagnosis not present

## 2021-01-18 DIAGNOSIS — G910 Communicating hydrocephalus: Secondary | ICD-10-CM | POA: Diagnosis present

## 2021-01-18 DIAGNOSIS — G919 Hydrocephalus, unspecified: Secondary | ICD-10-CM | POA: Diagnosis not present

## 2021-01-18 DIAGNOSIS — R112 Nausea with vomiting, unspecified: Secondary | ICD-10-CM | POA: Diagnosis present

## 2021-01-18 DIAGNOSIS — E876 Hypokalemia: Secondary | ICD-10-CM | POA: Diagnosis not present

## 2021-01-18 DIAGNOSIS — M4804 Spinal stenosis, thoracic region: Secondary | ICD-10-CM | POA: Diagnosis not present

## 2021-01-18 DIAGNOSIS — R197 Diarrhea, unspecified: Secondary | ICD-10-CM | POA: Diagnosis present

## 2021-01-18 DIAGNOSIS — C7949 Secondary malignant neoplasm of other parts of nervous system: Principal | ICD-10-CM | POA: Diagnosis present

## 2021-01-18 DIAGNOSIS — Z20822 Contact with and (suspected) exposure to covid-19: Secondary | ICD-10-CM | POA: Diagnosis present

## 2021-01-18 DIAGNOSIS — E162 Hypoglycemia, unspecified: Secondary | ICD-10-CM | POA: Diagnosis present

## 2021-01-18 DIAGNOSIS — R000 Tachycardia, unspecified: Secondary | ICD-10-CM | POA: Diagnosis not present

## 2021-01-18 DIAGNOSIS — E872 Acidosis, unspecified: Secondary | ICD-10-CM | POA: Diagnosis present

## 2021-01-18 DIAGNOSIS — R404 Transient alteration of awareness: Secondary | ICD-10-CM | POA: Diagnosis not present

## 2021-01-18 DIAGNOSIS — M5126 Other intervertebral disc displacement, lumbar region: Secondary | ICD-10-CM | POA: Diagnosis not present

## 2021-01-18 DIAGNOSIS — M5124 Other intervertebral disc displacement, thoracic region: Secondary | ICD-10-CM | POA: Diagnosis not present

## 2021-01-18 DIAGNOSIS — C50919 Malignant neoplasm of unspecified site of unspecified female breast: Secondary | ICD-10-CM | POA: Diagnosis not present

## 2021-01-18 DIAGNOSIS — C50811 Malignant neoplasm of overlapping sites of right female breast: Secondary | ICD-10-CM | POA: Diagnosis present

## 2021-01-18 LAB — CBC WITH DIFFERENTIAL/PLATELET
Abs Immature Granulocytes: 0.11 10*3/uL — ABNORMAL HIGH (ref 0.00–0.07)
Basophils Absolute: 0.1 10*3/uL (ref 0.0–0.1)
Basophils Relative: 1 %
Eosinophils Absolute: 0 10*3/uL (ref 0.0–0.5)
Eosinophils Relative: 0 %
HCT: 38.5 % (ref 36.0–46.0)
Hemoglobin: 12.6 g/dL (ref 12.0–15.0)
Immature Granulocytes: 1 %
Lymphocytes Relative: 6 %
Lymphs Abs: 0.6 10*3/uL — ABNORMAL LOW (ref 0.7–4.0)
MCH: 30.4 pg (ref 26.0–34.0)
MCHC: 32.7 g/dL (ref 30.0–36.0)
MCV: 93 fL (ref 80.0–100.0)
Monocytes Absolute: 0.3 10*3/uL (ref 0.1–1.0)
Monocytes Relative: 3 %
Neutro Abs: 8.4 10*3/uL — ABNORMAL HIGH (ref 1.7–7.7)
Neutrophils Relative %: 89 %
Platelets: 296 10*3/uL (ref 150–400)
RBC: 4.14 MIL/uL (ref 3.87–5.11)
RDW: 15.4 % (ref 11.5–15.5)
WBC: 9.4 10*3/uL (ref 4.0–10.5)
nRBC: 0 % (ref 0.0–0.2)

## 2021-01-18 LAB — CSF CULTURE W GRAM STAIN: Culture: NO GROWTH

## 2021-01-18 LAB — RESP PANEL BY RT-PCR (FLU A&B, COVID) ARPGX2
Influenza A by PCR: NEGATIVE
Influenza B by PCR: NEGATIVE
SARS Coronavirus 2 by RT PCR: NEGATIVE

## 2021-01-18 LAB — COMPREHENSIVE METABOLIC PANEL
ALT: 36 U/L (ref 0–44)
AST: 49 U/L — ABNORMAL HIGH (ref 15–41)
Albumin: 3.2 g/dL — ABNORMAL LOW (ref 3.5–5.0)
Alkaline Phosphatase: 73 U/L (ref 38–126)
Anion gap: 15 (ref 5–15)
BUN: 13 mg/dL (ref 6–20)
CO2: 21 mmol/L — ABNORMAL LOW (ref 22–32)
Calcium: 7.8 mg/dL — ABNORMAL LOW (ref 8.9–10.3)
Chloride: 106 mmol/L (ref 98–111)
Creatinine, Ser: 0.82 mg/dL (ref 0.44–1.00)
GFR, Estimated: >60 mL/min (ref >60)
Glucose, Bld: 159 mg/dL — ABNORMAL HIGH (ref 70–99)
Potassium: 2.5 mmol/L — CL (ref 3.5–5.1)
Sodium: 142 mmol/L (ref 135–145)
Total Bilirubin: 1.2 mg/dL (ref 0.3–1.2)
Total Protein: 6.4 g/dL — ABNORMAL LOW (ref 6.5–8.1)

## 2021-01-18 LAB — MAGNESIUM: Magnesium: 2.3 mg/dL (ref 1.7–2.4)

## 2021-01-18 LAB — PROTIME-INR
INR: 1.2 (ref 0.8–1.2)
Prothrombin Time: 15.5 seconds — ABNORMAL HIGH (ref 11.4–15.2)

## 2021-01-18 LAB — TYPE AND SCREEN
ABO/RH(D): O POS
Antibody Screen: NEGATIVE

## 2021-01-18 LAB — POTASSIUM: Potassium: 3 mmol/L — ABNORMAL LOW (ref 3.5–5.1)

## 2021-01-18 LAB — ABO/RH: ABO/RH(D): O POS

## 2021-01-18 MED ORDER — PANTOPRAZOLE SODIUM 40 MG PO TBEC
40.0000 mg | DELAYED_RELEASE_TABLET | Freq: Every day | ORAL | Status: DC
  Administered 2021-01-19 – 2021-01-20 (×2): 40 mg via ORAL
  Filled 2021-01-18 (×3): qty 1

## 2021-01-18 MED ORDER — ACETAMINOPHEN 650 MG RE SUPP: 650.0000 mg | RECTAL | Status: DC | PRN

## 2021-01-18 MED ORDER — LEVETIRACETAM IN NACL 1000 MG/100ML IV SOLN
1000.0000 mg | Freq: Once | INTRAVENOUS | Status: AC
  Administered 2021-01-18: 1000 mg via INTRAVENOUS
  Filled 2021-01-18: qty 100

## 2021-01-18 MED ORDER — CHLORHEXIDINE GLUCONATE 0.12% ORAL RINSE (MEDLINE KIT)
15.0000 mL | Freq: Two times a day (BID) | OROMUCOSAL | Status: DC
  Administered 2021-01-19: 15 mL via OROMUCOSAL

## 2021-01-18 MED ORDER — SERTRALINE HCL 100 MG PO TABS
100.0000 mg | ORAL_TABLET | Freq: Every day | ORAL | Status: DC
  Administered 2021-01-19 – 2021-01-20 (×2): 100 mg via ORAL
  Filled 2021-01-18 (×2): qty 1

## 2021-01-18 MED ORDER — ACETAMINOPHEN 500 MG PO TABS
1000.0000 mg | ORAL_TABLET | Freq: Once | ORAL | Status: AC
  Administered 2021-01-18: 1000 mg via ORAL
  Filled 2021-01-18: qty 2

## 2021-01-18 MED ORDER — OXYCODONE-ACETAMINOPHEN 5-325 MG PO TABS: 1.0000 | ORAL_TABLET | Freq: Three times a day (TID) | ORAL | Status: DC | PRN

## 2021-01-18 MED ORDER — SODIUM CHLORIDE 0.45 % IV SOLN
Freq: Once | INTRAVENOUS | Status: AC
  Filled 2021-01-18: qty 1000

## 2021-01-18 MED ORDER — POTASSIUM CHLORIDE 10 MEQ/100ML IV SOLN
10.0000 meq | Freq: Once | INTRAVENOUS | Status: AC
  Administered 2021-01-18: 10 meq via INTRAVENOUS
  Filled 2021-01-18: qty 100

## 2021-01-18 MED ORDER — LORAZEPAM 2 MG/ML IJ SOLN
1.0000 mg | INTRAMUSCULAR | Status: DC | PRN
  Administered 2021-01-18: 1 mg via INTRAVENOUS
  Filled 2021-01-18: qty 1

## 2021-01-18 MED ORDER — ACETAMINOPHEN 325 MG PO TABS: 650.0000 mg | ORAL_TABLET | ORAL | Status: DC | PRN

## 2021-01-18 MED ORDER — ENOXAPARIN SODIUM 40 MG/0.4ML IJ SOSY
40.0000 mg | PREFILLED_SYRINGE | INTRAMUSCULAR | Status: DC
  Administered 2021-01-18 – 2021-01-19 (×2): 40 mg via SUBCUTANEOUS
  Filled 2021-01-18 (×3): qty 0.4

## 2021-01-18 MED ORDER — LIDOCAINE-PRILOCAINE 2.5-2.5 % EX CREA: TOPICAL_CREAM | CUTANEOUS | Status: DC | PRN

## 2021-01-18 MED ORDER — ONDANSETRON HCL 4 MG/2ML IJ SOLN
4.0000 mg | Freq: Once | INTRAMUSCULAR | Status: AC
  Administered 2021-01-18: 4 mg via INTRAVENOUS
  Filled 2021-01-18: qty 2

## 2021-01-18 MED ORDER — DIPHENOXYLATE-ATROPINE 2.5-0.025 MG PO TABS: 1.0000 | ORAL_TABLET | Freq: Four times a day (QID) | ORAL | Status: DC | PRN

## 2021-01-18 MED ORDER — ORAL CARE MOUTH RINSE
15.0000 mL | OROMUCOSAL | Status: DC
  Administered 2021-01-19 – 2021-01-20 (×9): 15 mL via OROMUCOSAL

## 2021-01-18 MED ORDER — SODIUM CHLORIDE 0.45 % IV SOLN: INTRAVENOUS | Status: DC

## 2021-01-18 MED ORDER — LEVETIRACETAM 500 MG PO TABS
500.0000 mg | ORAL_TABLET | Freq: Two times a day (BID) | ORAL | Status: DC
  Administered 2021-01-18 – 2021-01-20 (×4): 500 mg via ORAL
  Filled 2021-01-18 (×4): qty 1

## 2021-01-18 MED ORDER — SODIUM CHLORIDE 0.9 % IV SOLN: 75.0000 mL/h | INTRAVENOUS | Status: DC

## 2021-01-18 MED ORDER — LORAZEPAM 0.5 MG PO TABS
0.5000 mg | ORAL_TABLET | Freq: Four times a day (QID) | ORAL | Status: DC | PRN
  Filled 2021-01-18: qty 1

## 2021-01-18 NOTE — ED Triage Notes (Signed)
Husband noted patient making a gurgling sound with arms tense, eyes rolled back of head? Seizure, lasted approximately 1 min, no hx of seizure. Has breast cancer with mets to spine. [place] this am in [place], no injury, not on thinners.

## 2021-01-18 NOTE — ED Notes (Signed)
Patient transported to MRI 

## 2021-01-18 NOTE — ED Provider Notes (Addendum)
[facility] EMERGENCY DEPARTMENT Provider Note   CSN: [id number] Arrival date & time: 01/18/21  3662     History Chief Complaint  Patient presents with   Seizures    Amanda Williams is a 55 y.o. female.  55 year old female with prior medical history as detailed below presents for evaluation.  Patient with known history of metastatic breast cancer.  Patient with recent initiation of intrathecal chemotherapy for treatment of CNS mets.  Patient was asleep in her bed next to her husband this morning.  The husband felt the patient jerking.  Patient had tonic-clonic type movements of both upper and lower extremities.  The seizure-like activity lasted approximately 1 minute.  Afterwards the patient was somewhat confused for the next 15 minutes.  She is now returned to baseline.  Patient without prior history of seizure.  Patient with persistent symptoms of nausea and vomiting over the last several days per the husband.  The history is provided by the patient and a relative.  Seizures Seizure activity on arrival: no   Seizure type:  Tonic Initial focality:  Unable to specify Episode characteristics: abnormal movements   Return to baseline: yes   Severity:  Mild Duration:  60 seconds Timing:  Once Number of seizures this episode:  1 Progression:  Resolved     Past Medical History:  Diagnosis Date   Allergy    Breast cancer ([place])    Difficult intubation    see 01/12/17 anesthesia record   Family history of breast cancer    Hematuria    ? 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   Abnormal CT of the chest 12/19/2020   Bilateral breast cancer ([place]) 01/12/2017   Genetic testing 01/05/2017   Family history of breast cancer    Malignant neoplasm of overlapping sites of right breast  in female, estrogen receptor positive ([place]) 12/23/2016   Malignant neoplasm of overlapping 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 breast ([place]) 12/18/2016   Exposure to communicable disease 08/04/2012   Counseling about travel 08/04/2012   Throat symptom 04/12/2012   Visit for preventive health examination 10/18/2010   Dense breasts 10/18/2010   Pelvic pressure in female 05/20/2010   PANIC ATTACK 03/27/2008   MICROSCOPIC HEMATURIA 03/27/2008   PALPITATIONS 02/02/2007   ALLERGIC RHINITIS 10/07/2006    Past Surgical History:  Procedure Laterality Date   BREAST BIOPSY     left   BREAST RECONSTRUCTION WITH PLACEMENT OF TISSUE EXPANDER AND FLEX HD (ACELLULAR HYDRATED DERMIS) Bilateral 01/12/2017   Procedure: BREAST RECONSTRUCTION WITH PLACEMENT OF TISSUE EXPANDER AND ALLODERM;  Surgeon: Irene Limbo, MD;  Location: [place];  Service: Plastics;  Laterality: Bilateral;   COLONOSCOPY     IR IMAGING GUIDED PORT INSERTION  09/17/2020   LIPOSUCTION WITH LIPOFILLING Bilateral 11/02/2017   Procedure: lipofilling from abdomen to bilateral chest;  Surgeon: Irene Limbo, MD;  Location: [place];  Service: Plastics;  Laterality: Bilateral;   MASTECTOMY W/ SENTINEL NODE BIOPSY Bilateral 01/12/2017   Procedure: RIGHT MODIFIED RADICAL MASTECTOMY, LEFT TOTAL MASTECTOMY WITH LEFT SENTINEL LYMPH NODE BIOPSY;  Surgeon: Rolm Bookbinder, MD;  Location: [place];  Service: General;  Laterality: Bilateral;   PORTACATH PLACEMENT N/A 02/15/2017   Procedure: INSERTION PORT-A-CATH WITH [place];  Surgeon: Donne Hazel,  Rodman Key, MD;  Location: [place];  Service: General;  Laterality: N/A;   REMOVAL OF TISSUE EXPANDER AND PLACEMENT OF IMPLANT Bilateral 07/16/2017   Procedure: BILATERAL REMOVAL OF TISSUE EXPANDER AND PLACEMENT OF SILICONE IMPLANT;  Surgeon: Irene Limbo, MD;  Location: [place];  Service: Plastics;  Laterality: Bilateral;     OB History   No obstetric history on  file.     Family History  Problem Relation Age of Onset   Breast cancer Maternal Grandmother 87    Social History   Tobacco Use   Smoking status: Never   Smokeless tobacco: Never  Vaping Use   Vaping Use: Never used  Substance Use Topics   Alcohol use: Yes    Alcohol/week: 4.0 standard drinks    Types: 4 Glasses of wine per week    Comment: ocassionally   Drug use: No    Home Medications Prior to Admission medications   Medication Sig Start Date End Date Taking? Authorizing Provider  acetaminophen (TYLENOL) 500 MG tablet Take 1,000 mg by mouth as needed for moderate pain (for pain/headaches.).     [provider]  calcium carbonate (CALTRATE 600) 1500 (600 Ca) MG TABS tablet Take 1 tablet (1,500 mg total) by mouth daily with breakfast. 10/27/18   Nicholas Lose, MD  diphenoxylate-atropine (LOMOTIL) 2.5-0.025 MG tablet Take 1 tablet by mouth 4 (four) times daily as needed for diarrhea or loose stools. Patient not taking: Reported on 01/02/2021 10/09/20   Nicholas Lose, MD  ibuprofen (ADVIL,MOTRIN) 200 MG tablet Take 400 mg by mouth as needed for moderate pain (for pain/headaches).     [provider]  lidocaine-prilocaine (EMLA) cream Apply to affected area once 09/09/20   Nicholas Lose, MD  LORazepam (ATIVAN) 0.5 MG tablet Take 1 tablet (0.5 mg total) by mouth every 6 (six) hours as needed (Nausea or vomiting). 10/09/20   Nicholas Lose, MD  MULTIPLE VITAMIN PO Take 1 tablet by mouth daily.    [provider]  omeprazole (PRILOSEC) 20 MG capsule Take 20 mg by mouth daily.    [provider]  ondansetron (ZOFRAN) 8 MG tablet Take 1 tablet (8 mg total) by mouth 2 (two) times daily as needed for refractory nausea / vomiting. Start on day 3 after chemo. 09/09/20   Nicholas Lose, MD  oxyCODONE-acetaminophen (PERCOCET/ROXICET) 5-325 MG tablet Take 1 tablet by mouth every 8 (eight) hours as needed for severe pain. 12/26/20   Nicholas Lose, MD   prochlorperazine (COMPAZINE) 10 MG tablet Take 1 tablet (10 mg total) by mouth every 6 (six) hours as needed (Nausea or vomiting). 09/09/20   Nicholas Lose, MD  promethazine (PHENERGAN) 25 MG suppository Place 1 suppository (25 mg total) rectally every 6 (six) hours as needed for nausea or vomiting. 01/17/21   Nicholas Lose, MD  sertraline (ZOLOFT) 100 MG tablet TAKE 1 TABLET BY MOUTH EVERY DAY 11/29/20   Panosh, Standley Brooking, MD    Allergies    Penicillins, Sulfa antibiotics, Sulfonamide derivatives, Other, and Adhesive [tape]  Review of Systems   Review of Systems  Neurological:  Positive for seizures.  All other systems reviewed and are negative.  Physical Exam Updated Vital Signs BP 107/72   Pulse (!) 104   Temp 98 F (36.7 C)   Resp (!) 22   LMP 03/14/2017 Comment: chemo therapy   SpO2 94%   Physical Exam Vitals and nursing note reviewed.  Constitutional:      General: She is not in acute distress.  Appearance: Normal appearance. She is well-developed.  HENT:     Head: Normocephalic and atraumatic.  Eyes:     Conjunctiva/sclera: Conjunctivae normal.     Pupils: Pupils are equal, round, and reactive to light.  Cardiovascular:     Rate and Rhythm: Normal rate and regular rhythm.     Heart sounds: Normal heart sounds.  Pulmonary:     Effort: Pulmonary effort is normal. No respiratory distress.     Breath sounds: Normal breath sounds.  Abdominal:     General: There is no distension.     Palpations: Abdomen is soft.     Tenderness: There is no abdominal tenderness.  Musculoskeletal:        General: No deformity. Normal range of motion.     Cervical back: Normal range of motion and neck supple.  Skin:    General: Skin is warm and dry.  Neurological:     General: No focal deficit present.     Mental Status: She is alert and oriented to person, place, and time. Mental status is at baseline.     Cranial Nerves: No cranial nerve deficit.     Sensory: No sensory deficit.     ED Results / Procedures / Treatments   Labs (all labs ordered are listed, but only abnormal results are displayed) Labs Reviewed  COMPREHENSIVE METABOLIC PANEL - Abnormal; Notable for the following components:      Result Value   Potassium 2.5 (*)    CO2 21 (*)    Glucose, Bld 159 (*)    Calcium 7.8 (*)    Total Protein 6.4 (*)    Albumin 3.2 (*)    AST 49 (*)    All other components within normal limits  CBC WITH DIFFERENTIAL/PLATELET - Abnormal; Notable for the following components:   Neutro Abs 8.4 (*)    Lymphs Abs 0.6 (*)    Abs Immature Granulocytes 0.11 (*)    All other components within normal limits  PROTIME-INR - Abnormal; Notable for the following components:   Prothrombin Time 15.5 (*)    All other components within normal limits  RESP PANEL BY RT-PCR (FLU A&B, COVID) ARPGX2  URINALYSIS, ROUTINE W REFLEX MICROSCOPIC  TYPE AND SCREEN  ABO/RH    EKG EKG Interpretation  Date/Time:  Saturday January 18 2021 07:51:38 EDT Ventricular Rate:  104 PR Interval:  131 QRS Duration: 84 QT Interval:  343 QTC Calculation: 452 R Axis:   41 Text Interpretation: Sinus tachycardia Anterior infarct, old Confirmed by Dene Gentry [phone]) on 01/18/2021 9:27:05 AM  Radiology CT Head Wo Contrast  Result Date: 01/18/2021 CLINICAL DATA:  Seizure EXAM: CT HEAD WITHOUT CONTRAST TECHNIQUE: Contiguous axial images were obtained from the base of the skull through the vertex without intravenous contrast. COMPARISON:  08/13/2020 FINDINGS: Brain: New communicating hydrocephalus. No visible mass, hemorrhage, or infarct. Diffusely effaced cerebellar folia. 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 No evidence of leptomeningeal disease on recent enhanced brain MRI, question signs of meningitis. Recommend repeat brain MRI with contrast. Electronically Signed   By: Jorje Guild M.D.   On: 01/18/2021 10:02    DG Chest Port 1 View  Result Date: 01/18/2021 CLINICAL DATA:  Seizure EXAM: PORTABLE CHEST 1 VIEW COMPARISON:  08/14/2018 FINDINGS: Port in the anterior chest wall with tip in distal SVC. Normal cardiac silhouette. Mild linear interstitial markings within LEFT and RIGHT lung. No pneumothorax. No focal consolidation No acute osseous abnormality. IMPRESSION: Interstitial  edema pattern Electronically Signed   By: Suzy Bouchard M.D.   On: 01/18/2021 08:44    Procedures Procedures   Medications Ordered in ED Medications  levETIRAcetam (KEPPRA) IVPB 1000 mg/100 mL premix (has no administration in time range)    ED Course  I have reviewed the triage vital signs and the nursing notes.  Pertinent labs & imaging results that were available during my care of the patient were reviewed by me and considered in my medical decision making (see chart for details).    MDM Rules/Calculators/A&P                           MDM  MSE complete  ENJOLI TIDD was evaluated in Emergency Department on 01/18/2021 for the symptoms described in the history of present illness. She was evaluated in the context of the global COVID-19 pandemic, which necessitated consideration that the patient might be at risk for infection with the SARS-CoV-2 virus that causes COVID-19. Institutional protocols and algorithms that pertain to the evaluation of patients at risk for COVID-19 are in a state of rapid change based on information released by regulatory bodies including the CDC and federal and state organizations. These policies and algorithms were followed during the patient's care in the ED.  Patient with hx of metastatic breast cancer with leptomeningeal carcinomatosis with recent initiation of intrathecal methotrexate presents after witnessed seizure-like episode.  Seizure itself lasted approximately 1 minute. Generalized tonic-clonic activity reported by husband. Postictal period lasted approximately 15 minutes.   Patient returned to baseline mental status upon arrival to the ED.  Case discussed with Dr. Mickeal Skinner of neuro oncology.  He agrees with plan for Keppra load. If patient able to be discharged patient should be discharged on outpatient Keppra.  Work-up demonstrates evidence of hypokalemia with potassium 2.5.  Patient complains of chronic persistent nausea.  Patient would likely benefit from admission for repletion of potassium and continued observation.  Dr. Tamala Julian with hospitalist services aware case and will evaluate.  32 Dr. Mickeal Skinner of neuro oncology is updated regarding plan for admission.  He requests MRI of brain and entire spine to help evaluate metastatic CNS involvement.     Final Clinical Impression(s) / ED Diagnoses Final diagnoses:  Seizure-like activity ([place])  Hypokalemia    Rx / DC Orders ED Discharge Orders     None        Valarie Merino, MD 01/18/21 1204    Valarie Merino, MD 01/18/21 1205

## 2021-01-18 NOTE — ED Notes (Signed)
ED Provider at bedside. 

## 2021-01-18 NOTE — ED Notes (Signed)
IV team at bedside for port access.  

## 2021-01-18 NOTE — ED Notes (Signed)
Patient transported to CT 

## 2021-01-18 NOTE — ED Notes (Signed)
Returned from MRI 

## 2021-01-18 NOTE — H&P (Signed)
History and Physical    BRETT SOZA GDJ:[id number] DOB: 08-03-1965 DOA: 01/18/2021  Referring MD/NP/PA: Dene Gentry, MD PCP: Burnis Medin, MD  Consultants:Vaslow, MD)( neurology oncolog) Delsa Bern, MD (Obstetrics and Gynecology) Excell Seltzer, MD (Inactive) as Consulting Physician (General Surgery) Nicholas Lose, MD as Consulting Physician (Hematology and Oncology) Kyung Rudd, MD as Consulting Physician (Radiation Oncology) Delice Bison, Charlestine Massed, NP as Nurse Practitioner (Hematology and Oncology) Patient coming from: home via EMS  Chief Complaint: Seizure   I have personally briefly reviewed patient's old medical records in [place]   HPI: Amanda Williams is a 55 y.o. 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  Patient husband provides much of her history.  He was awoken out of his sleep by hearing the patient making a gurgling sound.  He saw that the patient was rolled to her side with arms and legs tensed making a jerking movement which lasted from 1 to 2 minutes before resolving.  Patient thereafter was noted to be breathing, but was unresponsive.  Patient was noted to be somewhat postictal for 15 minutes or so prior to that.  She has been dealing with headaches persistent headaches for which a lumbar puncture was performed.  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  At home her husband notes that she has been weak and sleeps most of the day away.  She has been dealing with nausea, vomiting, and a little bit of diarrhea.  She was just switched to suppositories which have helped.  She had her first round intrathecal methotrexate on 10/27.  ED Course: Upon admission into the emergency department patient was seen to be afebrile, pulse 94-1 06, respirations 18-23, blood pressures 95/62 to  109/69, and O2 saturation maintained on room air.  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  Labs significant for potassium 2.5.  Patient had been loaded with 1 g of Keppra IV and given potassium chloride 10 mEq IV.  Case had been discussed with Dr. Mickeal Skinner of neuro oncology who recommended starting patient on Keppra 500 mg twice daily and checking MRI of the brain and spine.  TRH called to admit.  Review of Systems  Unable to perform ROS: Other (Patient very lethargic from Ativan given to obtain MRI)  Constitutional:  Positive for malaise/fatigue.  Gastrointestinal:  Positive for diarrhea, nausea and vomiting.  Neurological:  Positive for seizures and headaches.   Past Medical History:  Diagnosis Date   Allergy    Breast cancer ([place])    Difficult intubation    see 01/12/17 anesthesia record   Family history of breast cancer    Hematuria    ? trigonitis   History of kidney stones    Panic attack    echo 2009 normal    Past Surgical History:  Procedure Laterality Date   BREAST BIOPSY     left   BREAST RECONSTRUCTION WITH PLACEMENT OF TISSUE EXPANDER AND FLEX HD (ACELLULAR HYDRATED DERMIS) Bilateral 01/12/2017   Procedure: BREAST RECONSTRUCTION WITH PLACEMENT OF TISSUE EXPANDER AND ALLODERM;  Surgeon: Irene Limbo, MD;  Location: [facility];  Service: Plastics;  Laterality: Bilateral;   COLONOSCOPY     IR IMAGING GUIDED PORT INSERTION  09/17/2020   LIPOSUCTION WITH LIPOFILLING Bilateral 11/02/2017   Procedure: lipofilling from abdomen to bilateral chest;  Surgeon:  Irene Limbo, MD;  Location: [facility];  Service: Plastics;  Laterality: Bilateral;   MASTECTOMY W/ SENTINEL NODE BIOPSY Bilateral 01/12/2017   Procedure: RIGHT MODIFIED RADICAL MASTECTOMY, LEFT TOTAL MASTECTOMY WITH LEFT SENTINEL LYMPH NODE BIOPSY;  Surgeon: Rolm Bookbinder, MD;  Location: [facility];  Service: General;   Laterality: Bilateral;   PORTACATH PLACEMENT N/A 02/15/2017   Procedure: INSERTION PORT-A-CATH WITH [place];  Surgeon: Rolm Bookbinder, MD;  Location: [facility];  Service: General;  Laterality: N/A;   REMOVAL OF TISSUE EXPANDER AND PLACEMENT OF IMPLANT Bilateral 07/16/2017   Procedure: BILATERAL REMOVAL OF TISSUE EXPANDER AND PLACEMENT OF SILICONE IMPLANT;  Surgeon: Irene Limbo, MD;  Location: [facility];  Service: Plastics;  Laterality: Bilateral;     reports that she has never smoked. She has never used smokeless tobacco. She reports current alcohol use of about 4.0 standard drinks per week. She reports that she does not use drugs.  Allergies  Allergen Reactions   Penicillins Hives, Other (See Comments) and Rash    Has patient had a PCN reaction causing immediate rash, facial/tongue/throat swelling, SOB or lightheadedness with hypotension: No HAS PT DEVELOPED SEVERE RASH INVOLVING MUCUS MEMBRANES or SKIN NECROSIS: #  #  #  YES  #  #  #   Has patient had a PCN reaction that required hospitalization: No Has patient had a PCN reaction occurring within the last 10 years: No If all of the above answers are "NO", then may proceed with Cephalosporin use.  Has patient had a PCN reaction causing immediate rash, facial/tongue/throat swelling, SOB or lightheadedness with hypotension: No HAS PT DEVELOPED SEVERE RASH INVOLVING MUCUS MEMBRANES or SKIN NECROSIS: #  #  #  YES  #  #  #   Has patient had a PCN reaction that required hospitalization: No Has patient had a PCN reaction occurring within the last 10 years: No If all of the above answers are "NO", then may proceed with Cephalosporin use. Has patient had a PCN reaction causing immediate rash, facial/tongue/throat swelling, SOB or lightheadedness with hypotension: No HAS PT DEVELOPED SEVERE RASH INVOLVING MUCUS MEMBRANES or SKIN NECROSIS: #  #  #  YES  #  #  #   Has patient had a PCN reaction that required hospitalization: No Has patient had a PCN  reaction occurring within the last 10 years: No If all of the above answers are "NO", then may proceed with Cephalosporin use.   Sulfa Antibiotics Hives   Sulfonamide Derivatives Hives   Other Other (See Comments)    Redness, rash and blister   Adhesive [Tape] Other (See Comments)    Redness     Family History  Problem Relation Age of Onset   Breast cancer Maternal Grandmother 87    Prior to Admission medications   Medication Sig Start Date End Date Taking? Authorizing Provider  acetaminophen (TYLENOL) 500 MG tablet Take 1,000 mg by mouth as needed for moderate pain (for pain/headaches.).    Yes [provider]  calcium carbonate (CALTRATE 600) 1500 (600 Ca) MG TABS tablet Take 1 tablet (1,500 mg total) by mouth daily with breakfast. 10/27/18  Yes Nicholas Lose, MD  diphenoxylate-atropine (LOMOTIL) 2.5-0.025 MG tablet Take 1 tablet by mouth 4 (four) times daily as needed for diarrhea or loose stools. 10/09/20  Yes Nicholas Lose, MD  ibuprofen (ADVIL,MOTRIN) 200 MG tablet Take 400 mg by mouth as needed for moderate pain (for pain/headaches).    Yes [provider]  lidocaine-prilocaine (  EMLA) cream Apply to affected area once 09/09/20  Yes Nicholas Lose, MD  LORazepam (ATIVAN) 0.5 MG tablet Take 1 tablet (0.5 mg total) by mouth every 6 (six) hours as needed (Nausea or vomiting). 10/09/20  Yes Nicholas Lose, MD  MULTIPLE VITAMIN PO Take 1 tablet by mouth daily.   Yes [provider]  omeprazole (PRILOSEC) 20 MG capsule Take 20 mg by mouth daily.   Yes [provider]  ondansetron (ZOFRAN) 8 MG tablet Take 1 tablet (8 mg total) by mouth 2 (two) times daily as needed for refractory nausea / vomiting. Start on day 3 after chemo. 09/09/20  Yes Nicholas Lose, MD  oxyCODONE-acetaminophen (PERCOCET/ROXICET) 5-325 MG tablet Take 1 tablet by mouth every 8 (eight) hours as needed for severe pain. 12/26/20  Yes Nicholas Lose, MD  prochlorperazine (COMPAZINE) 10 MG tablet  Take 1 tablet (10 mg total) by mouth every 6 (six) hours as needed (Nausea or vomiting). 09/09/20  Yes Nicholas Lose, MD  promethazine (PHENERGAN) 25 MG suppository Place 1 suppository (25 mg total) rectally every 6 (six) hours as needed for nausea or vomiting. 01/17/21  Yes Nicholas Lose, MD  sertraline (ZOLOFT) 100 MG tablet TAKE 1 TABLET BY MOUTH EVERY DAY 11/29/20  Yes Panosh, Standley Brooking, MD    Physical Exam:  Constitutional: Chronically ill-appearing female currently resting in no acute Vitals:   01/18/21 0845 01/18/21 0900 01/18/21 1100 01/18/21 1130  BP: 109/69 105/69 95/62 (!) 98/56  Pulse: 97 94 99 94  Resp:  (!) 21 (!) 22 (!) 23  Temp:      SpO2: 94% 93% 96% 94%   Eyes: PERRL, lids and conjunctivae normal ENMT: Mucous membranes are dry. Posterior pharynx clear of any exudate or lesions.  Neck: normal, supple, no masses, no thyromegaly Respiratory: clear to auscultation bilaterally, no wheezing, no crackles. Normal respiratory effort. No accessory muscle use.  Cardiovascular: Regular rate and rhythm, no murmurs / rubs / gallops. No extremity edema. 2+ pedal pulses. No carotid bruits.  Port present on the upper right chest wall Abdomen: no tenderness, no masses palpated. No hepatosplenomegaly. Bowel sounds positive.  Musculoskeletal: no clubbing / cyanosis. No joint deformity upper and lower extremities.  Skin: no rashes, lesions, ulcers. No induration Neurologic: CN 2-12 grossly intact.  Able to move all Psychiatric: Lethargic.    Labs on Admission: I have personally reviewed following labs and imaging studies  CBC: Recent Labs  Lab 01/16/21 1423 01/18/21 0731  WBC 9.3 9.4  NEUTROABS 8.1* 8.4*  HGB 13.1 12.6  HCT 39.6 38.5  MCV 90.4 93.0  PLT 354 203   Basic Metabolic Panel: Recent Labs  Lab 01/16/21 1423 01/18/21 0731  NA 145 142  K 3.1* 2.5*  CL 106 106  CO2 15* 21*  GLUCOSE 149* 159*  BUN 14 13  CREATININE 0.72 0.82  CALCIUM 8.8* 7.8*   GFR: Estimated  Creatinine Clearance: 71.8 mL/min (by C-G formula based on SCr of 0.82 mg/dL). Liver Function Tests: Recent Labs  Lab 01/16/21 1423 01/18/21 0731  AST 53* 49*  ALT 48* 36  ALKPHOS 94 73  BILITOT 0.5 1.2  PROT 7.3 6.4*  ALBUMIN 3.6 3.2*   No results for input(s): LIPASE, AMYLASE in the last 168 hours. No results for input(s): AMMONIA in the last 168 hours. Coagulation Profile: Recent Labs  Lab 01/18/21 0731  INR 1.2   Cardiac Enzymes: No results for input(s): CKTOTAL, CKMB, CKMBINDEX, TROPONINI in the last 168 hours. BNP (last 3 results) No  results for input(s): PROBNP in the last 8760 hours. HbA1C: No results for input(s): HGBA1C in the last 72 hours. CBG: No results for input(s): GLUCAP in the last 168 hours. Lipid Profile: No results for input(s): CHOL, HDL, LDLCALC, TRIG, CHOLHDL, LDLDIRECT in the last 72 hours. Thyroid Function Tests: No results for input(s): TSH, T4TOTAL, FREET4, T3FREE, THYROIDAB in the last 72 hours. Anemia Panel: No results for input(s): VITAMINB12, FOLATE, FERRITIN, TIBC, IRON, RETICCTPCT in the last 72 hours. Urine analysis:    Component Value Date/Time   COLORURINE YELLOW 08/31/2017 1238   APPEARANCEUR CLEAR 08/31/2017 1238   LABSPEC 1.004 (L) 08/31/2017 1238   PHURINE 6.0 08/31/2017 1238   GLUCOSEU NEGATIVE 08/31/2017 1238   HGBUR LARGE (A) 08/31/2017 1238   HGBUR large 03/27/2008 0934   BILIRUBINUR NEGATIVE 08/31/2017 1238   BILIRUBINUR n 10/17/2010 1017   KETONESUR NEGATIVE 08/31/2017 1238   PROTEINUR NEGATIVE 08/31/2017 1238   UROBILINOGEN 0.2 10/17/2010 1017   UROBILINOGEN negative 03/27/2008 0934   NITRITE NEGATIVE 08/31/2017 1238   LEUKOCYTESUR SMALL (A) 08/31/2017 1238   Sepsis Labs: Recent Results (from the past 240 hour(s))  CSF culture w Gram Stain     Status: None   Collection Time: 01/14/21  2:30 PM   Specimen: CSF  Result Value Ref Range Status   Specimen Description   Final    CSF Performed at [facility], [place] [address], [place], [place] [address]    Special Requests   Final    NONE Performed at [facility], [place] [address] [place], [place] [address]    Gram Stain   Final    WBC PRESENT, PREDOMINANTLY MONONUCLEAR NO ORGANISMS SEEN CYTOSPIN SMEAR Gram Stain Report Called to,Read Back By and Verified With: K.DESOTA, RN AT 6195 ON 10.25.22 BY N.THOMPSON Performed at [facility] [address] [place], [place] [address]    Culture   Final    NO GROWTH 3 DAYS Performed at [facility], [place] [address], [place], [place] [address]    Report Status 01/18/2021 FINAL  Final  Resp Panel by RT-PCR (Flu A&B, Covid) Nasopharyngeal Swab     Status: None   Collection Time: 01/18/21  7:34 AM   Specimen: Nasopharyngeal Swab; Nasopharyngeal(NP) swabs in vial transport medium  Result Value Ref Range Status   SARS Coronavirus 2 by RT PCR NEGATIVE NEGATIVE Final    Comment: (NOTE) SARS-CoV-2 target nucleic acids are NOT DETECTED.  The SARS-CoV-2 RNA is generally detectable in upper respiratory specimens during the acute phase of infection. The lowest concentration of SARS-CoV-2 viral copies this assay can detect is 138 copies/mL. A negative result does not preclude SARS-Cov-2 infection and should not be used as the sole basis for treatment or other patient management decisions. A negative result may occur with  improper specimen collection/handling, submission of specimen other than nasopharyngeal swab, presence of viral mutation(s) within the areas targeted by this assay, and inadequate number of viral copies(<138 copies/mL). A negative result must be combined with clinical observations, patient history, and epidemiological information. The expected result is Negative.  Fact Sheet for Patients:  EntrepreneurPulse.com.au  Fact Sheet for Healthcare Providers:  IncredibleEmployment.be  This  test is no t yet approved or cleared by the [place] FDA and  has been authorized for detection and/or diagnosis of SARS-CoV-2 by FDA under an Emergency Use Authorization (EUA). This EUA will remain  in effect (meaning this test can be used) for the duration  of the COVID-19 declaration under Section 564(b)(1) of the Act, 21 U.S.C.section 360bbb-3(b)(1), unless the authorization is terminated  or revoked sooner.       Influenza A by PCR NEGATIVE NEGATIVE Final   Influenza B by PCR NEGATIVE NEGATIVE Final    Comment: (NOTE) The Xpert Xpress SARS-CoV-2/FLU/RSV plus assay is intended as an aid in the diagnosis of influenza from Nasopharyngeal swab specimens and should not be used as a sole basis for treatment. Nasal washings and aspirates are unacceptable for Xpert Xpress SARS-CoV-2/FLU/RSV testing.  Fact Sheet for Patients: EntrepreneurPulse.com.au  Fact Sheet for Healthcare Providers: IncredibleEmployment.be  This test is not yet approved or cleared by the [place] FDA and has been authorized for detection and/or diagnosis of SARS-CoV-2 by FDA under an Emergency Use Authorization (EUA). This EUA will remain in effect (meaning this test can be used) for the duration of the COVID-19 declaration under Section 564(b)(1) of the Act, 21 U.S.C. section 360bbb-3(b)(1), unless the authorization is terminated or revoked.  Performed at [facility], [place] [address], [place], [place] [address]      Radiological Exams on Admission: CT Head Wo Contrast  Result Date: 01/18/2021 CLINICAL DATA:  Seizure EXAM: CT HEAD WITHOUT CONTRAST TECHNIQUE: Contiguous axial images were obtained from the base of the skull through the vertex without intravenous contrast. COMPARISON:  08/13/2020 FINDINGS: Brain: New communicating hydrocephalus. No visible mass, hemorrhage, or infarct. Diffusely effaced cerebellar folia. Vascular: Negative Skull: Negative  Sinuses/Orbits: Negative IMPRESSION: Communicating hydrocephalus since MRI last month. No evidence of leptomeningeal disease on recent enhanced brain MRI, question signs of meningitis. Recommend repeat brain MRI with contrast. Electronically Signed   By: Jorje Guild M.D.   On: 01/18/2021 10:02   DG Chest Port 1 View  Result Date: 01/18/2021 CLINICAL DATA:  Seizure EXAM: PORTABLE CHEST 1 VIEW COMPARISON:  08/14/2018 FINDINGS: Port in the anterior chest wall with tip in distal SVC. Normal cardiac silhouette. Mild linear interstitial markings within LEFT and RIGHT lung. No pneumothorax. No focal consolidation No acute osseous abnormality. IMPRESSION: Interstitial edema pattern Electronically Signed   By: Suzy Bouchard M.D.   On: 01/18/2021 08:44    EKG: Independently reviewed.  Sinus tachycardia at 104 bpm  Assessment/Plan Seizure like activity: Acute.  Patient presents after having what was reported to be at generalized tonic-clonic movements of the upper and lower extremities lasting approximately 1 minute.  Patient thereafter was reported to be confused and not herself for 15 minutes prior to returning to baseline.  CT scan of the brain noted communicating hydrocephalus since MRI last month.  Patient had been given Keppra 1 g IV.  Dr. Mickeal Skinner neuro-oncology was consulted. -Admit to medical telemetry bed -Seizure order set utilized -Seizure precautions -Follow-up MRI of the brain and spine -Ativan as needed for seizure-like activity -Continue Keppra 500 mg twice daily -Appreciate Dr. Mickeal Skinner  Hypokalemia: Acute.  Initial potassium noted to be 2.5.  Patient reported to have persistent nausea and vomiting symptoms which is likely related.  Patient had been ordered potassium chloride 10 mEq IV x1 dose. -0.45% normal saline with 60 mEq of potassium chloride IV for 1 L at 100 mL/h -Recheck potassium after completion of IV fluids  Metastatic breast cancer with leptomeningeal carcinomatosis:  Patient is on Enhertu followed by Dr. Lindi Adie oncology.  She has been dealing with chronic headaches thought to be secondary to CSF involvement of cancer, and recently been started on intrathecal methotrexate treatments with plans for [place] reservoir placement. -Dr.  Gudena notified of patient's admission into the hospital -Continue oxycodone as needed for  Communicated hydrocephalus: New since previous MRI done on 12/13/2020. -Follow-up MRI brain  Nausea, vomiting, and diarrhea: Acute on chronic since patient has been undergoing chemotherapy. -Antiemetics as needed  Anxiety -Continue Zoloft   GERD -Continue omeprazole DVT prophylaxis: Lovenox Code Status: Full Family Communication: Husband updated at bedside Disposition Plan: Hopefully discharge home Consults called: Neuro oncology Admission status: Observation  Norval Morton MD [facility]   If 7PM-7AM, please contact night-coverage   01/18/2021, 12:00 PM

## 2021-01-19 ENCOUNTER — Encounter (HOSPITAL_COMMUNITY): Payer: Self-pay | Admitting: Internal Medicine

## 2021-01-19 DIAGNOSIS — C50811 Malignant neoplasm of overlapping sites of right female breast: Secondary | ICD-10-CM | POA: Diagnosis not present

## 2021-01-19 DIAGNOSIS — C7951 Secondary malignant neoplasm of bone: Secondary | ICD-10-CM | POA: Diagnosis not present

## 2021-01-19 DIAGNOSIS — R197 Diarrhea, unspecified: Secondary | ICD-10-CM | POA: Diagnosis present

## 2021-01-19 DIAGNOSIS — K219 Gastro-esophageal reflux disease without esophagitis: Secondary | ICD-10-CM | POA: Diagnosis not present

## 2021-01-19 DIAGNOSIS — Z79899 Other long term (current) drug therapy: Secondary | ICD-10-CM | POA: Diagnosis not present

## 2021-01-19 DIAGNOSIS — G910 Communicating hydrocephalus: Secondary | ICD-10-CM | POA: Diagnosis not present

## 2021-01-19 DIAGNOSIS — R000 Tachycardia, unspecified: Secondary | ICD-10-CM | POA: Diagnosis not present

## 2021-01-19 DIAGNOSIS — E162 Hypoglycemia, unspecified: Secondary | ICD-10-CM

## 2021-01-19 DIAGNOSIS — C7949 Secondary malignant neoplasm of other parts of nervous system: Secondary | ICD-10-CM | POA: Diagnosis not present

## 2021-01-19 DIAGNOSIS — R569 Unspecified convulsions: Secondary | ICD-10-CM | POA: Diagnosis not present

## 2021-01-19 DIAGNOSIS — Z888 Allergy status to other drugs, medicaments and biological substances status: Secondary | ICD-10-CM | POA: Diagnosis not present

## 2021-01-19 DIAGNOSIS — Z882 Allergy status to sulfonamides status: Secondary | ICD-10-CM | POA: Diagnosis not present

## 2021-01-19 DIAGNOSIS — E876 Hypokalemia: Secondary | ICD-10-CM | POA: Diagnosis not present

## 2021-01-19 DIAGNOSIS — F419 Anxiety disorder, unspecified: Secondary | ICD-10-CM | POA: Diagnosis not present

## 2021-01-19 DIAGNOSIS — Z880 Allergy status to penicillin: Secondary | ICD-10-CM | POA: Diagnosis not present

## 2021-01-19 DIAGNOSIS — R112 Nausea with vomiting, unspecified: Secondary | ICD-10-CM | POA: Diagnosis not present

## 2021-01-19 DIAGNOSIS — E872 Acidosis, unspecified: Secondary | ICD-10-CM | POA: Diagnosis not present

## 2021-01-19 DIAGNOSIS — Z170 Estrogen receptor positive status [ER+]: Secondary | ICD-10-CM | POA: Diagnosis not present

## 2021-01-19 DIAGNOSIS — Z20822 Contact with and (suspected) exposure to covid-19: Secondary | ICD-10-CM | POA: Diagnosis not present

## 2021-01-19 LAB — BASIC METABOLIC PANEL
Anion gap: 15 (ref 5–15)
BUN: 9 mg/dL (ref 6–20)
CO2: 16 mmol/L — ABNORMAL LOW (ref 22–32)
Calcium: 7.2 mg/dL — ABNORMAL LOW (ref 8.9–10.3)
Chloride: 111 mmol/L (ref 98–111)
Creatinine, Ser: 0.58 mg/dL (ref 0.44–1.00)
GFR, Estimated: >60 mL/min (ref >60)
Glucose, Bld: 58 mg/dL — ABNORMAL LOW (ref 70–99)
Potassium: 3.4 mmol/L — ABNORMAL LOW (ref 3.5–5.1)
Sodium: 142 mmol/L (ref 135–145)

## 2021-01-19 LAB — GLUCOSE, CAPILLARY
Glucose-Capillary: 186 mg/dL — ABNORMAL HIGH (ref 70–99)
Glucose-Capillary: 201 mg/dL — ABNORMAL HIGH (ref 70–99)

## 2021-01-19 LAB — CBG MONITORING, ED
Glucose-Capillary: 90 mg/dL (ref 70–99)
Glucose-Capillary: 94 mg/dL (ref 70–99)

## 2021-01-19 MED ORDER — POTASSIUM CHLORIDE 10 MEQ/100ML IV SOLN
10.0000 meq | INTRAVENOUS | Status: AC
Start: 2021-01-19 — End: 2021-01-19
  Administered 2021-01-19 (×4): 10 meq via INTRAVENOUS
  Filled 2021-01-19 (×4): qty 100

## 2021-01-19 MED ORDER — CHLORHEXIDINE GLUCONATE CLOTH 2 % EX PADS
6.0000 | MEDICATED_PAD | Freq: Every day | CUTANEOUS | Status: DC
  Administered 2021-01-19 – 2021-01-20 (×2): 6 via TOPICAL

## 2021-01-19 MED ORDER — DEXTROSE-NACL 5-0.45 % IV SOLN: INTRAVENOUS | Status: DC

## 2021-01-19 MED ORDER — ONDANSETRON HCL 4 MG/2ML IJ SOLN
4.0000 mg | Freq: Four times a day (QID) | INTRAMUSCULAR | Status: DC | PRN
  Administered 2021-01-19 – 2021-01-20 (×2): 4 mg via INTRAVENOUS
  Filled 2021-01-19 (×2): qty 2

## 2021-01-19 MED ORDER — DEXAMETHASONE SODIUM PHOSPHATE 4 MG/ML IJ SOLN
4.0000 mg | Freq: Two times a day (BID) | INTRAMUSCULAR | Status: DC
  Administered 2021-01-20: 4 mg via INTRAVENOUS
  Filled 2021-01-19: qty 1

## 2021-01-19 MED ORDER — DEXAMETHASONE SODIUM PHOSPHATE 10 MG/ML IJ SOLN
8.0000 mg | Freq: Two times a day (BID) | INTRAMUSCULAR | Status: AC
  Administered 2021-01-19 (×2): 8 mg via INTRAVENOUS
  Filled 2021-01-19 (×2): qty 1

## 2021-01-19 MED ORDER — MORPHINE SULFATE (PF) 2 MG/ML IV SOLN: 2.0000 mg | INTRAVENOUS | Status: DC | PRN

## 2021-01-19 NOTE — Progress Notes (Signed)
Amanda Williams  Code Status: FULL Amanda Williams is a 55 y.o. female patient admitted from ED awake, alert - oriented X4 - no acute distress noted. VSS -  no c/o shortness of breath, no c/o chest pain. Call light within reach, patient able to voice, and demonstrate understanding. Skin, clean-dry- intact without evidence of bruising, or skin tears.  No evidence of skin break down noted on exam.  Husband at bedside.  Will cont to eval and treat per MD orders.  Melonie Florida, RN  01/19/2021

## 2021-01-19 NOTE — Progress Notes (Signed)
PROGRESS NOTE    Amanda Williams  PJK:[id number] DOB: January 05, 1966 DOA: 01/18/2021 PCP: Burnis Medin, MD    Chief Complaint  Patient presents with   Seizures    Brief Narrative:  Amanda Williams is a 55 y.o. 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 No pathologic fracture -Present with seizure -Case discussed with your oncology Dr. Mickeal Skinner over the phone  mri findings are consistent with leptomeningeal carcinomatosis .  -Dr Mickeal Skinner recommend continue Keppra, start Decadron IV 8 mg twice daily today, then Decadron 4 mg twice daily IV tomorrow -Dr. Mickeal Skinner will discuss case on tumor board Monday morning, recommend go ahead consult palliative care for goals of care discussion, order placed -We will follow oncology /palliative care recommendation  Nausea vomiting  Husband report patient has very poor oral intake for the last 3 weeks due to nausea and vomiting Supportive therapy  Hypoglycemia/metabolic acidosis Change D5 normal saline to  D5 LR  Hypokalemia Replace K  Poor prognosis, palliative care consulted       Unresulted Labs (From admission, onward)     Start     Ordered   01/20/21 0500  Comprehensive metabolic panel  Tomorrow morning,   R        01/19/21 0954   01/20/21 0500  Magnesium  Tomorrow morning,   STAT        01/19/21 0954   01/18/21 0759  Urinalysis, Routine w reflex microscopic  Once,   STAT         01/18/21 0758              DVT prophylaxis: enoxaparin (LOVENOX) injection 40 mg Start: 01/18/21 1300   Code Status:full Family Communication: Husband at bedside Disposition:    Dispo: The patient is from: Home              Anticipated d/c is to: To be determined              Anticipated d/c date is: To be determined                Consultants:  Neuro oncology Dr. Mickeal Skinner Palliative care  Procedures:  None  Antimicrobials:   None     Objective: Vitals:   01/19/21 0345 01/19/21 0545 01/19/21 0700 01/19/21 0800  BP: 97/73 100/68 125/86 118/74  Pulse: 99 97 86 98  Resp: (!) 22 (!) 25 17 (!) 25  Temp:      SpO2: 96% 90% 95% 95%    Intake/Output Summary (Last 24 hours) at 01/19/2021 0954 Last data filed at 01/19/2021 0314 Gross per 24 hour  Intake 1096.87 ml  Output --  Net 1096.87 ml   There were no vitals filed for this visit.  Examination:  General exam: Very weak, lethargic Respiratory system: diminished, no wheezing, no rales, no rhonchi, respiratory effort normal. Cardiovascular system:  RRR.  Gastrointestinal system: Abdomen is nondistended, soft and nontender.  Normal bowel sounds heard. Central nervous system: Alert and  oriented. No focal neurological deficits. Extremities:  no edema Skin: No rashes, lesions or ulcers Psychiatry: Lethargic.     Data Reviewed: I have personally reviewed following labs and imaging studies  CBC: Recent Labs  Lab 01/16/21 1423 01/18/21 0731  WBC 9.3 9.4  NEUTROABS 8.1* 8.4*  HGB 13.1 12.6  HCT 39.6 38.5  MCV 90.4 93.0  PLT 354 315    Basic Metabolic Panel: Recent Labs  Lab 01/16/21 1423 01/18/21 0731 01/18/21 1650 01/19/21 0300  NA 145 142  --  142  K 3.1* 2.5* 3.0* 3.4*  CL 106 106  --  111  CO2 15* 21*  --  16*  GLUCOSE 149* 159*  --  58*  BUN 14 13  --  9  CREATININE 0.72 0.82  --  0.58  CALCIUM 8.8* 7.8*  --  7.2*  MG  --   --  2.3  --     GFR: Estimated Creatinine Clearance:  73.6 mL/min (by C-G formula based on SCr of 0.58 mg/dL).  Liver Function Tests: Recent Labs  Lab 01/16/21 1423 01/18/21 0731  AST 53* 49*  ALT 48* 36  ALKPHOS 94 73  BILITOT 0.5 1.2  PROT 7.3 6.4*  ALBUMIN 3.6 3.2*    CBG: Recent Labs  Lab 01/19/21 0814  GLUCAP 90     Recent Results (from the past 240 hour(s))  CSF culture w Gram Stain     Status: None   Collection Time: 01/14/21  2:30 PM   Specimen: CSF  Result Value Ref Range Status   Specimen Description   Final    CSF Performed at [facility], [place] [address] [place], [place] [address]    Special Requests   Final    NONE Performed at [facility], [place] [address], [place], [place] [address]    Gram Stain   Final    WBC PRESENT, PREDOMINANTLY MONONUCLEAR NO ORGANISMS SEEN CYTOSPIN SMEAR Gram Stain Report Called to,Read Back By and Verified With: K.DESOTA, RN AT 0626 ON 10.25.22 BY N.THOMPSON Performed at [facility] [address] [place], [place] [address]    Culture   Final    NO GROWTH 3 DAYS Performed at [facility], [place] [address] [place], [place] [address]    Report Status 01/18/2021 FINAL  Final  Resp Panel by RT-PCR (Flu A&B, Covid) Nasopharyngeal Swab     Status: None   Collection Time: 01/18/21  7:34 AM   Specimen: Nasopharyngeal Swab; Nasopharyngeal(NP) swabs in vial transport medium  Result Value Ref Range Status   SARS Coronavirus 2 by RT PCR NEGATIVE NEGATIVE Final    Comment: (NOTE) SARS-CoV-2 target nucleic acids are NOT DETECTED.  The SARS-CoV-2 RNA is generally detectable in upper respiratory specimens during the acute phase of infection. The lowest concentration of SARS-CoV-2 viral copies this assay can detect is 138 copies/mL. A negative result does not preclude SARS-Cov-2 infection and should not be used as the sole basis for treatment or other patient management decisions. A negative result may occur with   improper specimen collection/handling, submission of specimen other than nasopharyngeal swab, presence of viral mutation(s) within the areas targeted by this assay, and inadequate number of viral copies(<138 copies/mL). A negative result must be combined with clinical observations, patient history, and epidemiological information. The expected result is Negative.  Fact Sheet for Patients:  EntrepreneurPulse.com.au  Fact Sheet for Healthcare Providers:  IncredibleEmployment.be  This test is no t yet approved  or cleared by the [place] and  has been authorized for detection and/or diagnosis of SARS-CoV-2 by FDA under an Emergency Use Authorization (EUA). This EUA will remain  in effect (meaning this test can be used) for the duration of the COVID-19 declaration under Section 564(b)(1) of the Act, 21 U.S.C.section 360bbb-3(b)(1), unless the authorization is terminated  or revoked sooner.       Influenza A by PCR NEGATIVE NEGATIVE Final   Influenza B by PCR NEGATIVE NEGATIVE Final    Comment: (NOTE) The Xpert Xpress SARS-CoV-2/FLU/RSV plus assay is intended as an aid in the diagnosis of influenza from Nasopharyngeal swab specimens and should not be used as a sole basis for treatment. Nasal washings and aspirates are unacceptable for Xpert Xpress SARS-CoV-2/FLU/RSV testing.  Fact Sheet for Patients: EntrepreneurPulse.com.au  Fact Sheet for Healthcare Providers: IncredibleEmployment.be  This test is not yet approved or cleared by the [place] FDA and has been authorized for detection and/or diagnosis of SARS-CoV-2 by FDA under an Emergency Use Authorization (EUA). This EUA will remain in effect (meaning this test can be used) for the duration of the COVID-19 declaration under Section 564(b)(1) of the Act, 21 U.S.C. section 360bbb-3(b)(1), unless the authorization is terminated  or revoked.  Performed at [facility], [place] [address], [place], [place] [address]          Radiology Studies: CT Head Wo Contrast  Result Date: 01/18/2021 CLINICAL DATA:  Seizure EXAM: CT HEAD WITHOUT CONTRAST TECHNIQUE: Contiguous axial images were obtained from the base of the skull through the vertex without intravenous contrast. COMPARISON:  08/13/2020 FINDINGS: Brain: New communicating hydrocephalus. No visible mass, hemorrhage, or infarct. Diffusely effaced cerebellar folia. 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 No evidence of leptomeningeal disease on recent enhanced brain MRI, question signs of meningitis. Recommend repeat brain MRI with contrast. Electronically Signed   By: Jorje Guild M.D.   On: 01/18/2021 10:02   MR BRAIN WO CONTRAST  Result Date: 01/18/2021 CLINICAL DATA:  Seizure.  Metastatic breast cancer. EXAM: MRI HEAD WITHOUT CONTRAST TECHNIQUE: Multiplanar, multiecho pulse sequences of the brain and surrounding structures were obtained without intravenous contrast. COMPARISON:  CT head without contrast 01/18/2021. MR head without contrast 12/13/2020 FINDINGS: Brain: Communicating hydrocephalus is now present. Ventricles are diffusely dilated. Periventricular T2 hyperintensities are now present. No acute cortical infarct or hemorrhage is present. No focal mass lesion is evident. The internal auditory canals are within normal limits. The brainstem and cerebellum are within normal limits. Vascular: Flow is present in the major intracranial arteries. Skull and upper cervical spine: Signal loss at C2 and C3 are consistent with metastatic disease. The craniocervical junction is within normal limits. Sinuses/Orbits: The paranasal sinuses and mastoid air cells are clear. The globes and orbits are within normal limits. IMPRESSION: 1. New communicating hydrocephalus. 2. Periventricular T2  hyperintensities are now present. This likely reflects the sequela of chronic microvascular ischemia. 3. No acute cortical infarct. 4. No evidence for metastatic disease.  Contrast was not given. 5. Signal loss at C2 and C3 consistent with metastatic disease to the bone. Electronically Signed   By: San Morelle M.D.   On: 01/18/2021 15:16   MR CERVICAL SPINE WO CONTRAST  Result Date: 01/18/2021 CLINICAL DATA:  Metastatic breast cancer. EXAM: MRI CERVICAL SPINE WITHOUT CONTRAST TECHNIQUE: Multiplanar, multisequence MR imaging of the cervical spine was performed. No intravenous contrast was administered. COMPARISON:  PET-CT 08/22/2020 FINDINGS: Alignment: Normal  Vertebrae: Diffuse metastatic disease throughout the cervical spine as demonstrated on prior PET-CT. Innumerable lesions involving all of the cervical and upper thoracic vertebral bodies. I do not see any MR findings suspicious for pathologic fracture in the cervical spine. No epidural tumor. No obvious spinal cord involvement. Cord: Normal cord signal intensity.  No cord lesions or syrinx. Posterior Fossa, vertebral arteries, paraspinal tissues: No significant findings. Disc levels: No disc protrusions, significant spinal or foraminal stenosis in the cervical spine. IMPRESSION: 1. Diffuse metastatic disease throughout the cervical spine. No pathologic fracture. 2. No epidural tumor or spinal cord involvement. 3. No disc protrusions, significant spinal or foraminal stenosis in the cervical spine. Electronically Signed   By: Marijo Sanes M.D.   On: 01/18/2021 16:15   MR THORACIC SPINE WO CONTRAST  Result Date: 01/18/2021 CLINICAL DATA:  Seizure.  Metastatic breast cancer. EXAM: MRI THORACIC SPINE WITHOUT CONTRAST TECHNIQUE: Multiplanar, multisequence MR imaging of the thoracic spine was performed. No intravenous contrast was administered. COMPARISON:  CT of the chest and abdomen 11/18/2020 FINDINGS: Alignment: No significant listhesis is  present. Thoracic kyphosis is preserved. Vertebrae: Diffuse marrow signal changes are present with metastatic lesions I would every site. No pathologic fractures are present. No significant extraosseous tumor is present. Cord:  Normal signal and morphology. Paraspinal and other soft tissues: A right pleural effusion is present. Paraspinous soft tissues are unremarkable. Visualized upper abdomen is within normal limits. Disc levels: No significant disc disease or stenosis is present at T6-7 or above. T7-8: Left greater than right disc protrusions are noted. Moderate left foraminal narrowing is present. T8-9: A left paramedian disc extrusion extends slightly superiorly. Partial effacement of ventral CSF noted without significant stenosis. T9-10: Negative. T10-11: Negative. T11-12: Negative. T12-L1: Negative. IMPRESSION: 1. Diffuse marrow signal changes compatible with widespread metastatic disease. 2. No pathologic fractures or significant extraosseous tumor present. 3. Moderate left foraminal stenosis at T7-8 secondary to left greater than right disc protrusions. 4. Left paramedian disc extrusion at T8-9 without significant stenosis. Electronically Signed   By: San Morelle M.D.   On: 01/18/2021 15:41   MR LUMBAR SPINE WO CONTRAST  Result Date: 01/18/2021 CLINICAL DATA:  Metastatic breast cancer. The examination had to be discontinued prior to completion due to patient refusal for further imaging. Patient motion. EXAM: MRI LUMBAR SPINE WITHOUT CONTRAST TECHNIQUE: Multiplanar, multisequence MR imaging of the lumbar spine was performed. No intravenous contrast was administered. COMPARISON:  CT of the abdomen pelvis 11/18/2020. PET scan 08/22/2020. FINDINGS: Segmentation: 5 non rib-bearing lumbar type vertebral bodies are present. The lowest fully formed vertebral body is L5. Alignment: Slight retrolisthesis present at L2-3 and L3-4. No other significant listhesis is present. Straightening of the normal  lumbar lordosis noted. Vertebrae: Diffuse heterogeneous marrow signal is consistent with extensive osseous metastases. No pathologic fracture or extraosseous tumor is present. Conus medullaris and cauda equina: Conus extends to the L1 level. Conus and cauda equina appear normal. Paraspinal and other soft tissues: Paraspinous soft tissues are within normal limits on sagittal images. Disc levels: L1-2: Negative. L2-3: A mild broad-based disc bulge is present without focal stenosis. L3-4: A broad-based disc protrusion is present. Disc material extends into the foramina bilaterally. Mild bilateral foraminal narrowing is worse on the right. L4-5: A broad-based disc protrusion is present. Disc material extends into the foramina bilaterally. Mild left foraminal narrowing is present. L5-S1: Negative. IMPRESSION: 1. Diffuse osseous metastases. 2. No evidence of pathologic fracture or extraosseous tumor. 3. Mild bilateral foraminal narrowing at L3-4  is worse on the right. 4. Mild left foraminal narrowing at L4-5. Electronically Signed   By: San Morelle M.D.   On: 01/18/2021 16:37   DG Chest Port 1 View  Result Date: 01/18/2021 CLINICAL DATA:  Seizure EXAM: PORTABLE CHEST 1 VIEW COMPARISON:  08/14/2018 FINDINGS: Port in the anterior chest wall with tip in distal SVC. Normal cardiac silhouette. Mild linear interstitial markings within LEFT and RIGHT lung. No pneumothorax. No focal consolidation No acute osseous abnormality. IMPRESSION: Interstitial edema pattern Electronically Signed   By: Suzy Bouchard M.D.   On: 01/18/2021 08:44        Scheduled Meds:  chlorhexidine gluconate (MEDLINE KIT)  15 mL Mouth Rinse BID   [START ON 01/20/2021] dexamethasone (DECADRON) injection  4 mg Intravenous Q12H   dexamethasone (DECADRON) injection  8 mg Intravenous Q12H   enoxaparin (LOVENOX) injection  40 mg Subcutaneous Q24H   levETIRAcetam  500 mg Oral BID   mouth rinse  15 mL Mouth Rinse 10 times per day    pantoprazole  40 mg Oral Daily   sertraline  100 mg Oral Daily   Continuous Infusions:  dextrose 5 % and 0.45% NaCl 75 mL/hr at 01/19/21 0619     LOS: 0 days   Time spent: 87mns Greater than 50% of this time was spent in counseling, explanation of diagnosis, planning of further management, and coordination of care.   Voice Recognition /Viviann Sparedictation system was used to create this note, attempts have been made to correct errors. Please contact the author with questions and/or clarifications.   FFlorencia Reasons MD PhD FACP [facility]  Available via Epic secure chat 7am-7pm for nonurgent issues Please page for urgent issues To page the attending provider between 7A-7P or the covering provider during after hours 7P-7A, please log into the web site [email or web address] and access using universal [place] password for that web site. If you do not have the password, please call the hospital operator.    01/19/2021, 9:54 AM

## 2021-01-19 NOTE — Consult Note (Signed)
Consultation Note Date: 01/19/2021   Patient Name: Amanda Williams  DOB: 1965-06-07  MRN: [id number]  Age / Sex: 55 y.o., female  PCP: Amanda Bill Standley Brooking, MD Referring Physician: Florencia Reasons, MD  Reason for Consultation: Establishing goals of care  HPI/Patient Profile: 55 y.o. female  with past medical history of metastatic breast cancer who presented to the emergency department on 01/18/2021 after having seizure-like activity this morning. Patient's husband was awoken from sleep by hearing her making a gurgling sound. He saw that the patient was rolled to her side with arms and legs tensed making a jerking movement which lasted from 1 to 2 minutes before resolving.  Patient thereafter was noted to be breathing, but was unresponsive. In the ED, CT head showed communicating hydrocephalus since last MRI last month. Case was discussed with Dr. Mickeal Williams of neuro oncology who recommended starting IV keppra and obtaining MRI. He also recommended palliative care consult.   Clinical Assessment and Goals of Care: Chart reviewed. Patient was initially diagnosed with breast cancer in 2018. Received chemotherapy November 2018 through March 2019, followed by radiation therapy May 2019 through July 2019. Recently started on Enhertu.  Patient was seen by Amanda Williams in the office on 01/16/21. Per his note, she had recently developed intractable headaches as well as intractable nausea and vomiting for the past few weeks. She has been unable to even keep pills down. 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   I was notified by Amanda Williams that Dr. Lindi Williams has spoken with patient and husband regarding poor prognosis and they have decided to go home with hospice.   I went to bedside to see patient and her husband Event organiser. I confirmed that they wish to go home with  hospice. Provided education and counseling on the philosophy and benefits of hospice care. Discussed that it offers a holistic approach to care in the setting of end-stage illness/disease, and is about supporting the patient where they are allowing the natural course to occur.   After brief discussion on choice of hospice agencies, Amanda Williams states their preference for [facility]. They will need DME including a hospital bed, but do not want to wait on this to be delivered. They wish to discharge today, as soon as possible.   We discussed symptom management. Amanda Williams states they have recently have needed to use phenergan suppositories to manage Amanda Williams's nausea/vomiting as she has been mostly unable to tolerate pills. He reports they have a few suppositories left but is unsure if there are refills. I offer to send an RX for a refill just in case. Discussed use of dexamethasone to reduce inflammation and hopefully help reduce nausea and vomiting. Also discussed use of morphine concentrate solution to relieve pain. Provided education that this can be administered sublingually (doesn't have to be swallowed) so should be tolerated more easily than pills. Amanda Williams expresses agreement with plan for symptom management.   Briefly discussed the issue of code status. Discussed that in the  event of cardiopulmonary arrest, aggressive resuscitation efforts would likely be unsuccessful and were likely to cause additional pain. I expressed recommendation for having a DNR order in place, to which Amanda Williams agrees stating this is consistent with Amanda Williams's wishes.   Additional time spent coordinating care with Dr. Broadus Williams, [facility] team, hospice liaison from Amanda Williams, and bedside RN. Hospice will be out in the morning to admit patient. Discussed leaving port accessed in case patient needs infusions at home via CAD pump.   SUMMARY OF RECOMMENDATIONS   Gold DNR form signed and placed on chart Referral made to Amanda Williams port accessed in case  hospice needs to use it for administration of IV meds/infusions at home Pending discharge home today  Rx for symptom management (sent to pharmacy of choice): Phenergan suppository 25 mg every 6 hours as needed  Dexamethasone 2 mg every 8 hours as needed for nausea/vomiting Morphine concentrate solution 5-10 mg every 4 hours as needed for pain  Code Status/Advance Care Planning: DNR  Prognosis:  weeks  Discharge Planning: Home with Hospice      Primary Diagnoses: Present on Admission:  Hypokalemia  Communicating hydrocephalus (HCC)  Anxiety  Nausea, vomiting, and diarrhea   I have reviewed the medical record, interviewed the patient and family, and examined the patient. The following aspects are pertinent.  Past Medical History:  Diagnosis Date   Allergy    Breast cancer ([place])    Difficult intubation    see 01/12/17 anesthesia record   Family history of breast cancer    Hematuria    ? trigonitis   History of kidney stones    Panic attack    echo 2009 normal     Family History  Problem Relation Age of Onset   Breast cancer Maternal Grandmother 87   Scheduled Meds:  chlorhexidine gluconate (MEDLINE KIT)  15 mL Mouth Rinse BID   [START ON 01/20/2021] dexamethasone (DECADRON) injection  4 mg Intravenous Q12H   dexamethasone (DECADRON) injection  8 mg Intravenous Q12H   enoxaparin (LOVENOX) injection  40 mg Subcutaneous Q24H   levETIRAcetam  500 mg Oral BID   mouth rinse  15 mL Mouth Rinse 10 times per day   pantoprazole  40 mg Oral Daily   sertraline  100 mg Oral Daily   Continuous Infusions:  dextrose 5 % and 0.45% NaCl 75 mL/hr at 01/19/21 6834   potassium chloride 10 mEq (01/19/21 1543)   PRN Meds:.acetaminophen **OR** acetaminophen, diphenoxylate-atropine, lidocaine-prilocaine, LORazepam, LORazepam, morphine injection, ondansetron (ZOFRAN) IV, oxyCODONE-acetaminophen   Allergies  Allergen Reactions   Penicillins Hives, Other (See Comments) and Rash     Has patient had a PCN reaction causing immediate rash, facial/tongue/throat swelling, SOB or lightheadedness with hypotension: No HAS PT DEVELOPED SEVERE RASH INVOLVING MUCUS MEMBRANES or SKIN NECROSIS: #  #  #  YES  #  #  #   Has patient had a PCN reaction that required hospitalization: No Has patient had a PCN reaction occurring within the last 10 years: No If all of the above answers are "NO", then may proceed with Cephalosporin use.  Has patient had a PCN reaction causing immediate rash, facial/tongue/throat swelling, SOB or lightheadedness with hypotension: No HAS PT DEVELOPED SEVERE RASH INVOLVING MUCUS MEMBRANES or SKIN NECROSIS: #  #  #  YES  #  #  #   Has patient had a PCN reaction that required hospitalization: No Has patient had a PCN reaction occurring within the last 10 years: No If all  of the above answers are "NO", then may proceed with Cephalosporin use. Has patient had a PCN reaction causing immediate rash, facial/tongue/throat swelling, SOB or lightheadedness with hypotension: No HAS PT DEVELOPED SEVERE RASH INVOLVING MUCUS MEMBRANES or SKIN NECROSIS: #  #  #  YES  #  #  #   Has patient had a PCN reaction that required hospitalization: No Has patient had a PCN reaction occurring within the last 10 years: No If all of the above answers are "NO", then may proceed with Cephalosporin use.   Sulfa Antibiotics Hives   Sulfonamide Derivatives Hives   Other Other (See Comments)    Redness, rash and blister   Adhesive [Tape] Other (See Comments)    Redness    Review of Systems  Constitutional:  Positive for fatigue.  Gastrointestinal:  Positive for nausea and vomiting.   Physical Exam Constitutional:      General: She is not in acute distress.    Appearance: She is ill-appearing.  Pulmonary:     Effort: Pulmonary effort is normal.  Neurological:     Mental Status: She is oriented to person, place, and time. She is lethargic.     Motor: Weakness present.    Vital Signs:  BP 103/76 (BP Location: Left Arm)   Pulse 100   Temp 98 F (36.7 C) (Axillary)   Resp 20   LMP 03/14/2017 Comment: chemo therapy   SpO2 92%  Pain Scale: 0-10   Pain Score: 0-No pain   SpO2: SpO2: 92 % O2 Device:SpO2: 92 % O2 Flow Rate: .   IO: Intake/output summary:  Intake/Output Summary (Last 24 hours) at 01/19/2021 1616 Last data filed at 01/19/2021 0314 Gross per 24 hour  Intake 1000 ml  Output --  Net 1000 ml       Palliative Assessment/Data: PPS 20%     Time In: 1400 Time Out: 1520 Time Total: 80 minutes Greater than 50%  of this time was spent counseling and coordinating care related to the above assessment and plan.  Signed by: Lavena Bullion, NP   Please contact Palliative Medicine Team phone at [phone] for questions and concerns.  For individual provider: See Shea Evans

## 2021-01-19 NOTE — Progress Notes (Signed)
Patient ID: TABATHA RAZZANO, female   DOB: 03-22-1966, 55 y.o.   MRN: [id number]       Consult received. Chart reviewed.  I attempted to meet at bedside with patient and her husband Nicki Reaper to discuss diagnosis, prognosis, and GOC.  I introduced Palliative Medicine as specialized medical care for people living with serious illness. It focuses on providing relief from the symptoms and stress of a serious illness.   Nicki Reaper states that this may not be the best time to talk as Honey has recently received medication that is sedating. He kindly asks if I can come back tomorrow.  Per request, I will follow-up with patient and husband tomorrow.     Elie Confer, NP-C Palliative Medicine   Please call Palliative Medicine team phone with any questions [phone]. For individual providers please see AMION.   No charge

## 2021-01-20 ENCOUNTER — Telehealth: Payer: Self-pay | Admitting: Internal Medicine

## 2021-01-20 DIAGNOSIS — Z7189 Other specified counseling: Secondary | ICD-10-CM

## 2021-01-20 DIAGNOSIS — Z515 Encounter for palliative care: Secondary | ICD-10-CM

## 2021-01-20 DIAGNOSIS — R569 Unspecified convulsions: Secondary | ICD-10-CM | POA: Diagnosis not present

## 2021-01-20 DIAGNOSIS — Z66 Do not resuscitate: Secondary | ICD-10-CM

## 2021-01-20 LAB — GLUCOSE, CAPILLARY
Glucose-Capillary: 118 mg/dL — ABNORMAL HIGH (ref 70–99)
Glucose-Capillary: 122 mg/dL — ABNORMAL HIGH (ref 70–99)
Glucose-Capillary: 131 mg/dL — ABNORMAL HIGH (ref 70–99)
Glucose-Capillary: 134 mg/dL — ABNORMAL HIGH (ref 70–99)
Glucose-Capillary: 139 mg/dL — ABNORMAL HIGH (ref 70–99)

## 2021-01-20 MED ORDER — MORPHINE SULFATE (CONCENTRATE) 10 MG/0.5ML PO SOLN: 5.0000 mg | ORAL | 0 refills | Status: AC | PRN

## 2021-01-20 MED ORDER — ONDANSETRON HCL 4 MG/2ML IJ SOLN
4.0000 mg | Freq: Three times a day (TID) | INTRAMUSCULAR | Status: DC
  Administered 2021-01-20 (×2): 4 mg via INTRAVENOUS
  Filled 2021-01-20 (×3): qty 2

## 2021-01-20 MED ORDER — PROMETHAZINE HCL 25 MG RE SUPP: 25.0000 mg | Freq: Four times a day (QID) | RECTAL | 0 refills | Status: AC | PRN

## 2021-01-20 MED ORDER — DEXAMETHASONE 2 MG PO TABS: 2.0000 mg | ORAL_TABLET | Freq: Three times a day (TID) | ORAL | 0 refills | Status: AC | PRN

## 2021-01-20 MED ORDER — HEPARIN SOD (PORK) LOCK FLUSH 100 UNIT/ML IV SOLN
500.0000 [IU] | INTRAVENOUS | Status: AC | PRN
Start: 2021-01-20 — End: 2021-01-20
  Administered 2021-01-20: 500 [IU]
  Filled 2021-01-20: qty 5

## 2021-01-20 MED ORDER — MORPHINE SULFATE (CONCENTRATE) 10 MG/0.5ML PO SOLN: 5.0000 mg | ORAL | Status: DC | PRN

## 2021-01-20 MED ORDER — ONDANSETRON HCL 4 MG/2ML IJ SOLN: 4.0000 mg | Freq: Four times a day (QID) | INTRAMUSCULAR | Status: DC | PRN

## 2021-01-20 NOTE — Progress Notes (Signed)
Per request of hospice patient will leave with port-a-cath access. They are scheduled to assess her 01/21/2021 and will manage the access from there. Dr. Domenic Polite was consulted and approved the request. IV team has been paged to manage capping the access.

## 2021-01-20 NOTE — Progress Notes (Signed)
Patient motion sick with vomiting from sliding patient up in bed too fast.

## 2021-01-20 NOTE — Progress Notes (Signed)
PROGRESS NOTE    Amanda Williams  YFV:[id number] DOB: [birth year] DOA: 01/18/2021 PCP: Burnis Medin, MD    Chief Complaint  Patient presents with   Seizures    Brief Narrative:  Amanda Williams is a 55 y.o. female with medical history significant of metastatic breast cancer with bone and CSF/leptomeningeal involvement on Enhertu who presented with  seizures  Subjective:  -Continues to be bothered by headache and frequent nausea -Minimal to no p.o. intake, does not like the flavors of boost or Ensure  Assessment & Plan:  Leptomeningeal carcinomatosis/communicating hydrocephalus  Metastatic breast cancer.  -Presented with seizures, headache, severe nausea -Dr.Xu discussed with neuro-oncology Dr. Mickeal Skinner over the weekend, he recommended to continue Keppra and start Decadron, MRI findings were consistent with leptomeningeal carcinomatosis .  -Oncology to follow-up, prognosis is poor -Palliative care consulted  Nausea vomiting  Husband report patient has very poor oral intake for the last 3 weeks due to nausea and vomiting -change Zofran to scheduled  Hypoglycemia/metabolic acidosis -Was started on D5 half NS earlier this admission  Hypokalemia Replaced  DVT prophylaxis: enoxaparin (LOVENOX) injection 40 mg Start: 01/18/21 1300  Code Status:full Family Communication: Husband at bedside Disposition: To be determined, pending oncology and palliative care discussions                Consultants:  Neuro oncology Dr. Mickeal Skinner Oncology Dr. Lindi Adie Palliative care  Procedures:  None  Antimicrobials:   None  Objective: Vitals:   01/19/21 2049 01/20/21 0029 01/20/21 0448 01/20/21 0748  BP: 116/71 101/68 113/79 117/71  Pulse: (!) 103 (!) 102 98 94  Resp: 18 18 16 17   Temp: 98.1 F (36.7 C) 97.7 F (36.5 C) 98 F (36.7 C) 98 F (36.7 C)  TempSrc: Oral Oral Oral Oral  SpO2: 95% 90% 93% 92%    Intake/Output Summary (Last 24 hours) at 01/20/2021 1039 Last data filed at  01/19/2021 1830 Gross per 24 hour  Intake 1161.48 ml  Output --  Net 1161.48 ml   There were no vitals filed for this visit.  Examination:  Gen: Chronically ill frail female, laying in bed, awake alert, oriented to self and place HEENT: no JVD Lungs: Poor air movement otherwise clear  CVS: S1S2/RRR Abd: soft, Non tender, non distended, BS present Extremities: No edema Skin: no new rashes on exposed skin   Data Reviewed: I have personally reviewed following labs and imaging studies  CBC: Recent Labs  Lab 01/16/21 1423 01/18/21 0731  WBC 9.3 9.4  NEUTROABS 8.1* 8.4*  HGB 13.1 12.6  HCT 39.6 38.5  MCV 90.4 93.0  PLT 354 163    Basic Metabolic Panel: Recent Labs  Lab 01/16/21 1423 01/18/21 0731 01/18/21 1650 01/19/21 0300  NA 145 142  --  142  K 3.1* 2.5* 3.0* 3.4*  CL 106 106  --  111  CO2 15* 21*  --  16*  GLUCOSE 149* 159*  --  58*  BUN 14 13  --  9  CREATININE 0.72 0.82  --  0.58  CALCIUM 8.8* 7.8*  --  7.2*  MG  --   --  2.3  --     GFR: Estimated Creatinine Clearance: 73.6 mL/min (by C-G formula based on SCr of 0.58 mg/dL).  Liver Function Tests: Recent Labs  Lab 01/16/21 1423 01/18/21 0731  AST 53* 49*  ALT 48* 36  ALKPHOS 94 73  BILITOT 0.5 1.2  PROT 7.3 6.4*  ALBUMIN 3.6 3.2*  CBG: Recent Labs  Lab 01/19/21 1708 01/19/21 1919 01/20/21 0010 01/20/21 0337 01/20/21 0753  GLUCAP 186* 201* 131* 139* 134*     Recent Results (from the past 240 hour(s))  CSF culture w Gram Stain     Status: None   Collection Time: 01/14/21  2:30 PM   Specimen: CSF  Result Value Ref Range Status   Specimen Description   Final    CSF Performed at [facility], [place] [address] [place], [place] [address]    Special Requests   Final    NONE Performed at [facility], [place] [address] [place], [place] [address]    Gram Stain   Final    WBC PRESENT, PREDOMINANTLY MONONUCLEAR NO ORGANISMS SEEN CYTOSPIN  SMEAR Gram Stain Report Called to,Read Back By and Verified With: K.DESOTA, RN AT 5361 ON 10.25.22 BY N.THOMPSON Performed at [place] [address] [place], [place] [address]    Culture   Final    NO GROWTH 3 DAYS Performed at [facility], [place] [address] [place], [place] [address]    Report Status 01/18/2021 FINAL  Final  Resp Panel by RT-PCR (Flu A&B, Covid) Nasopharyngeal Swab     Status: None   Collection Time: 01/18/21  7:34 AM   Specimen: Nasopharyngeal Swab; Nasopharyngeal(NP) swabs in vial transport medium  Result Value Ref Range Status   SARS Coronavirus 2 by RT PCR NEGATIVE NEGATIVE Final    Comment: (NOTE) SARS-CoV-2 target nucleic acids are NOT DETECTED.  The SARS-CoV-2 RNA is generally detectable in upper respiratory specimens during the acute phase of infection. The lowest concentration of SARS-CoV-2 viral copies this assay can detect is 138 copies/mL. A negative result does not preclude SARS-Cov-2 infection and should not be used as the sole basis for treatment or other patient management decisions. A negative result may occur with  improper specimen collection/handling, submission of specimen other than nasopharyngeal swab, presence of viral mutation(s) within the areas targeted by this assay, and inadequate number of viral copies(<138 copies/mL). A negative result must be combined with clinical observations, patient history, and epidemiological information. The expected result is Negative.  Fact Sheet for Patients:  EntrepreneurPulse.com.au  Fact Sheet for Healthcare Providers:  IncredibleEmployment.be  This test is no t yet approved or cleared by the [place] FDA and  has been authorized for detection and/or diagnosis of SARS-CoV-2 by FDA under an Emergency Use Authorization (EUA). This EUA will remain  in effect (meaning this test can be used) for the duration of the COVID-19  declaration under Section 564(b)(1) of the Act, 21 U.S.C.section 360bbb-3(b)(1), unless the authorization is terminated  or revoked sooner.       Influenza A by PCR NEGATIVE NEGATIVE Final   Influenza B by PCR NEGATIVE NEGATIVE Final    Comment: (NOTE) The Xpert Xpress SARS-CoV-2/FLU/RSV plus assay is intended as an aid in the diagnosis of influenza from Nasopharyngeal swab specimens and should not be used as a sole basis for treatment. Nasal washings and aspirates are unacceptable for Xpert Xpress SARS-CoV-2/FLU/RSV testing.  Fact Sheet for Patients: EntrepreneurPulse.com.au  Fact Sheet for Healthcare Providers: IncredibleEmployment.be  This test is not yet approved or cleared by the [place] FDA and has been authorized for detection and/or diagnosis of SARS-CoV-2 by FDA under an Emergency Use Authorization (EUA). This EUA will remain in effect (meaning this test can be used) for the duration of the COVID-19 declaration under Section 564(b)(1) of the Act,  21 U.S.C. section 360bbb-3(b)(1), unless the authorization is terminated or revoked.  Performed at [facility], [place] [address] [place], [place] [address]          Radiology Studies: MR BRAIN WO CONTRAST  Result Date: 01/18/2021 CLINICAL DATA:  Seizure.  Metastatic breast cancer. EXAM: MRI HEAD WITHOUT CONTRAST TECHNIQUE: Multiplanar, multiecho pulse sequences of the brain and surrounding structures were obtained without intravenous contrast. COMPARISON:  CT head without contrast 01/18/2021. MR head without contrast 12/13/2020 FINDINGS: Brain: Communicating hydrocephalus is now present. Ventricles are diffusely dilated. Periventricular T2 hyperintensities are now present. No acute cortical infarct or hemorrhage is present. No focal mass lesion is evident. The internal auditory canals are within normal limits. The brainstem and cerebellum are within normal limits. Vascular:  Flow is present in the major intracranial arteries. Skull and upper cervical spine: Signal loss at C2 and C3 are consistent with metastatic disease. The craniocervical junction is within normal limits. Sinuses/Orbits: The paranasal sinuses and mastoid air cells are clear. The globes and orbits are within normal limits. IMPRESSION: 1. New communicating hydrocephalus. 2. Periventricular T2 hyperintensities are now present. This likely reflects the sequela of chronic microvascular ischemia. 3. No acute cortical infarct. 4. No evidence for metastatic disease.  Contrast was not given. 5. Signal loss at C2 and C3 consistent with metastatic disease to the bone. Electronically Signed   By: San Morelle M.D.   On: 01/18/2021 15:16   MR CERVICAL SPINE WO CONTRAST  Result Date: 01/18/2021 CLINICAL DATA:  Metastatic breast cancer. EXAM: MRI CERVICAL SPINE WITHOUT CONTRAST TECHNIQUE: Multiplanar, multisequence MR imaging of the cervical spine was performed. No intravenous contrast was administered. COMPARISON:  PET-CT 08/22/2020 FINDINGS: Alignment: Normal Vertebrae: Diffuse metastatic disease throughout the cervical spine as demonstrated on prior PET-CT. Innumerable lesions involving all of the cervical and upper thoracic vertebral bodies. I do not see any MR findings suspicious for pathologic fracture in the cervical spine. No epidural tumor. No obvious spinal cord involvement. Cord: Normal cord signal intensity.  No cord lesions or syrinx. Posterior Fossa, vertebral arteries, paraspinal tissues: No significant findings. Disc levels: No disc protrusions, significant spinal or foraminal stenosis in the cervical spine. IMPRESSION: 1. Diffuse metastatic disease throughout the cervical spine. No pathologic fracture. 2. No epidural tumor or spinal cord involvement. 3. No disc protrusions, significant spinal or foraminal stenosis in the cervical spine. Electronically Signed   By: Marijo Sanes M.D.   On: 01/18/2021  16:15   MR THORACIC SPINE WO CONTRAST  Result Date: 01/18/2021 CLINICAL DATA:  Seizure.  Metastatic breast cancer. EXAM: MRI THORACIC SPINE WITHOUT CONTRAST TECHNIQUE: Multiplanar, multisequence MR imaging of the thoracic spine was performed. No intravenous contrast was administered. COMPARISON:  CT of the chest and abdomen 11/18/2020 FINDINGS: Alignment: No significant listhesis is present. Thoracic kyphosis is preserved. Vertebrae: Diffuse marrow signal changes are present with metastatic lesions I would every site. No pathologic fractures are present. No significant extraosseous tumor is present. Cord:  Normal signal and morphology. Paraspinal and other soft tissues: A right pleural effusion is present. Paraspinous soft tissues are unremarkable. Visualized upper abdomen is within normal limits. Disc levels: No significant disc disease or stenosis is present at T6-7 or above. T7-8: Left greater than right disc protrusions are noted. Moderate left foraminal narrowing is present. T8-9: A left paramedian disc extrusion extends slightly superiorly. Partial effacement of ventral CSF noted without significant stenosis. T9-10: Negative. T10-11: Negative. T11-12: Negative. T12-L1: Negative. IMPRESSION: 1. Diffuse marrow signal changes  compatible with widespread metastatic disease. 2. No pathologic fractures or significant extraosseous tumor present. 3. Moderate left foraminal stenosis at T7-8 secondary to left greater than right disc protrusions. 4. Left paramedian disc extrusion at T8-9 without significant stenosis. Electronically Signed   By: San Morelle M.D.   On: 01/18/2021 15:41   MR LUMBAR SPINE WO CONTRAST  Result Date: 01/18/2021 CLINICAL DATA:  Metastatic breast cancer. The examination had to be discontinued prior to completion due to patient refusal for further imaging. Patient motion. EXAM: MRI LUMBAR SPINE WITHOUT CONTRAST TECHNIQUE: Multiplanar, multisequence MR imaging of the lumbar spine  was performed. No intravenous contrast was administered. COMPARISON:  CT of the abdomen pelvis 11/18/2020. PET scan 08/22/2020. FINDINGS: Segmentation: 5 non rib-bearing lumbar type vertebral bodies are present. The lowest fully formed vertebral body is L5. Alignment: Slight retrolisthesis present at L2-3 and L3-4. No other significant listhesis is present. Straightening of the normal lumbar lordosis noted. Vertebrae: Diffuse heterogeneous marrow signal is consistent with extensive osseous metastases. No pathologic fracture or extraosseous tumor is present. Conus medullaris and cauda equina: Conus extends to the L1 level. Conus and cauda equina appear normal. Paraspinal and other soft tissues: Paraspinous soft tissues are within normal limits on sagittal images. Disc levels: L1-2: Negative. L2-3: A mild broad-based disc bulge is present without focal stenosis. L3-4: A broad-based disc protrusion is present. Disc material extends into the foramina bilaterally. Mild bilateral foraminal narrowing is worse on the right. L4-5: A broad-based disc protrusion is present. Disc material extends into the foramina bilaterally. Mild left foraminal narrowing is present. L5-S1: Negative. IMPRESSION: 1. Diffuse osseous metastases. 2. No evidence of pathologic fracture or extraosseous tumor. 3. Mild bilateral foraminal narrowing at L3-4 is worse on the right. 4. Mild left foraminal narrowing at L4-5. Electronically Signed   By: San Morelle M.D.   On: 01/18/2021 16:37    Scheduled Meds:  chlorhexidine gluconate (MEDLINE KIT)  15 mL Mouth Rinse BID   Chlorhexidine Gluconate Cloth  6 each Topical Daily   dexamethasone (DECADRON) injection  4 mg Intravenous Q12H   enoxaparin (LOVENOX) injection  40 mg Subcutaneous Q24H   levETIRAcetam  500 mg Oral BID   mouth rinse  15 mL Mouth Rinse 10 times per day   ondansetron (ZOFRAN) IV  4 mg Intravenous TID AC & HS   pantoprazole  40 mg Oral Daily   sertraline  100 mg Oral  Daily   Continuous Infusions:  dextrose 5 % and 0.45% NaCl 75 mL/hr at 01/19/21 0619     LOS: 1 day   Time spent: 73mn  Voice Recognition /Dragon dictation system was used to create this note, attempts have been made to correct errors. Please contact the author with questions and/or clarifications.   PDomenic Polite MD [facility]   01/20/2021, 10:39 AM

## 2021-01-20 NOTE — TOC Transition Note (Signed)
Transition of Care [facility]) - CM/SW Discharge Note   Patient Details  Name: LESHAE MCCLAY MRN: [id number] Date of Birth: 1965/12/08  Transition of Care [facility]) CM/SW Contact:  Geralynn Ochs, LCSW Phone Number: 01/20/2021, 4:16 PM   Clinical Narrative:   CSW notified by palliative NP that patient and spouse would like to transition home with hospice as soon as possible. CSW confirmed with hospice that they can admit patient tomorrow, and family will provide transportation home. Hospice to arrange for DME, but patient and spouse do not want to wait on DME to be delivered prior to discharge home. No other TOC needs at this time.     Final next level of care: Home w Hospice Care Barriers to Discharge: Barriers Resolved   Patient Goals and CMS Choice Patient states their goals for this hospitalization and ongoing recovery are:: to get home asap CMS Medicare.gov Compare Post Acute Care list provided to:: Patient Choice offered to / list presented to : Patient  Discharge Placement                Patient to be transferred to facility by: family car Name of family member notified: Self Patient and family notified of of transfer: 01/20/21  Discharge Plan and Services                                     Social Determinants of Health (SDOH) Interventions     Readmission Risk Interventions No flowsheet data found.

## 2021-01-20 NOTE — Telephone Encounter (Signed)
Amanda Williams called from [facility] to see if Dr.Panosh agrees with the hospice referral and if she would agree to continue being pcp during this time. Patient is currently admitted to hospital and they are anticipating her to be short stay.      Good callback number is [phone]      Please advise

## 2021-01-20 NOTE — Progress Notes (Signed)
Manufacturing engineer [facility]) Hospital Liaison: RN note    Notified by Transition of Care Manger of patient/family request for [facility] services at home after discharge. Chart and patient information under review by [facility] physician. Hospice eligibility confirmed.     Writer spoke with husband  to initiate education related to hospice philosophy, services and team approach to care. Husband verbalized understanding of information given. Per discussion, plan is for discharge to home by private car.   Please send signed and completed DNR form home with patient/family. Patient will need prescriptions for discharge comfort medications.     [facility] Referral Center aware of the above. Please notify [facility] when patient is ready to leave the unit at discharge. (Call [phone] or [phone] after 5pm.) [facility] information and contact numbers given to  husband.      A Please do not hesitate to call with questions.    Thank you,   Farrel Gordon, RN, [place] [facility] Liaison   [phone]

## 2021-01-20 NOTE — Progress Notes (Signed)
Oncology: Since this morning, has been conversing with patient's husband regarding the events over the weekend. 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  The discussion was that radiation or intrathecal methotrexate will not have any benefit in her current situation.  I shared these results with the patient's husband and  with the patient and recommended no further systemic treatment for her metastatic breast cancer with leptomeningeal carcinomatosis. We briefly discussed that the progression of disease over the past 3 weeks has been dramatic and if the pace of her disease progresses in the same way she may not have more than a few weeks of life expectancy.  Patient understood the discussion and they were both willing to proceed with hospice care. They would like to have a hospital bed and other community so that they can take care of her at home. I offered my assistance with any of these arrangements as necessary. It is with a heavy heart and sadness that we agreed to proceed with hospice care and stop all systemic treatments  Thank you for your assistance with hospice arrangements

## 2021-01-21 NOTE — Discharge Summary (Signed)
Physician Discharge Summary  Amanda Williams EUM:[id number] DOB: 07/13/1965 DOA: 01/18/2021  PCP: Burnis Medin, MD  Admit date: 01/18/2021 Discharge date: 01/20/2021  Time spent: 35 minutes  Recommendations for Outpatient Follow-up:  Discharge home with hospice services   Discharge Diagnoses:   Leptomeningeal carcinomatosis Communicating hydrocephalus Metastatic breast cancer   Malignant neoplasm of overlapping sites of right breast in female, estrogen receptor positive ([place])   Seizure ([place])   Hypokalemia   Communicating hydrocephalus (HCC)   Anxiety   Nausea, vomiting, and diarrhea   Discharge Condition: Poor  Diet recommendation: Comfort feeds  There were no vitals filed for this visit.  History of present illness:  Amanda Williams is a 55 y.o. 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  -Presented with seizures, headache, severe nausea -Dr.Xu discussed with neuro-oncology Dr. Mickeal Skinner over the weekend, he recommended to continue Keppra and start Decadron, MRI findings were consistent with leptomeningeal carcinomatosis .  -Seen by oncology in consultation, prognosis is felt to be very poor, less life expectancy was suspected to be a few weeks, Dr. Lindi Adie recommended hospice services, patient was discharged home with home hospice care   Nausea vomiting  Husband report patient has very poor oral intake for the last 3 weeks due to nausea and vomiting -change Zofran to scheduled   Hypoglycemia/metabolic acidosis -Resolved   Hypokalemia Replaced  Consultations: Oncology, palliative care, neuro-oncology Discharge Exam: Vitals:   01/20/21 1255 01/20/21 1654  BP: 106/69 122/70  Pulse: 99 88  Resp: 20 17  Temp: (!) 97.4 F (36.3 C) 97.9 F (36.6 C)  SpO2:  95% 94%     Discharge Instructions   Discharge Instructions     Diet general   Complete by: As directed       Allergies as of 01/20/2021       Reactions   Penicillins Hives, Other (See Comments), Rash   Has patient had a PCN reaction causing immediate rash, facial/tongue/throat swelling, SOB or lightheadedness with hypotension: No HAS PT DEVELOPED SEVERE RASH INVOLVING MUCUS MEMBRANES or SKIN NECROSIS: #  #  #  YES  #  #  #   Has patient had a PCN reaction that required hospitalization: No Has patient had a PCN reaction occurring within the last 10 years: No If all of the above answers are "NO", then may proceed with Cephalosporin use. Has patient had a PCN reaction causing immediate rash, facial/tongue/throat swelling, SOB or lightheadedness with hypotension: No HAS PT DEVELOPED SEVERE RASH INVOLVING MUCUS MEMBRANES or SKIN NECROSIS: #  #  #  YES  #  #  #   Has patient had a PCN reaction that required hospitalization: No Has patient had a PCN reaction occurring within the last 10 years: No If all of the above answers are "NO", then may proceed with Cephalosporin use. Has patient had a PCN reaction causing immediate rash, facial/tongue/throat swelling, SOB or lightheadedness with hypotension: No HAS PT DEVELOPED SEVERE RASH INVOLVING MUCUS MEMBRANES or SKIN NECROSIS: #  #  #  YES  #  #  #   Has patient had a PCN reaction that required hospitalization: No Has patient had a PCN reaction occurring within the last 10 years: No If all of the above answers are "NO", then may proceed with Cephalosporin use.   Sulfa Antibiotics Hives   Sulfonamide Derivatives  Hives   Other Other (See Comments)   Redness, rash and blister   Adhesive [tape] Other (See Comments)   Redness        Medication List     STOP taking these medications    calcium carbonate 1500 (600 Ca) MG Tabs tablet Commonly known as: Caltrate 600   lidocaine-prilocaine cream Commonly known as: EMLA   MULTIPLE  VITAMIN PO   oxyCODONE-acetaminophen 5-325 MG tablet Commonly known as: PERCOCET/ROXICET       TAKE these medications    acetaminophen 500 MG tablet Commonly known as: TYLENOL Take 1,000 mg by mouth as needed for moderate pain (for pain/headaches.).   dexamethasone 2 MG tablet Commonly known as: DECADRON Take 1 tablet (2 mg total) by mouth every 8 (eight) hours as needed (as needed for nausea).   diphenoxylate-atropine 2.5-0.025 MG tablet Commonly known as: LOMOTIL Take 1 tablet by mouth 4 (four) times daily as needed for diarrhea or loose stools.   ibuprofen 200 MG tablet Commonly known as: ADVIL Take 400 mg by mouth as needed for moderate pain (for pain/headaches).   LORazepam 0.5 MG tablet Commonly known as: Ativan Take 1 tablet (0.5 mg total) by mouth every 6 (six) hours as needed (Nausea or vomiting).   morphine CONCENTRATE 10 MG/0.5ML Soln concentrated solution Place 0.25-0.5 mLs (5-10 mg total) under the tongue every 4 (four) hours as needed for severe pain or moderate pain.   omeprazole 20 MG capsule Commonly known as: PRILOSEC Take 20 mg by mouth daily.   ondansetron 8 MG tablet Commonly known as: Zofran Take 1 tablet (8 mg total) by mouth 2 (two) times daily as needed for refractory nausea / vomiting. Start on day 3 after chemo.   prochlorperazine 10 MG tablet Commonly known as: COMPAZINE Take 1 tablet (10 mg total) by mouth every 6 (six) hours as needed (Nausea or vomiting).   promethazine 25 MG suppository Commonly known as: PHENERGAN Place 1 suppository (25 mg total) rectally every 6 (six) hours as needed for nausea or vomiting.   sertraline 100 MG tablet Commonly known as: ZOLOFT TAKE 1 TABLET BY MOUTH EVERY DAY       Allergies  Allergen Reactions   Penicillins Hives, Other (See Comments) and Rash    Has patient had a PCN reaction causing immediate rash, facial/tongue/throat swelling, SOB or lightheadedness with hypotension: No HAS PT  DEVELOPED SEVERE RASH INVOLVING MUCUS MEMBRANES or SKIN NECROSIS: #  #  #  YES  #  #  #   Has patient had a PCN reaction that required hospitalization: No Has patient had a PCN reaction occurring within the last 10 years: No If all of the above answers are "NO", then may proceed with Cephalosporin use.  Has patient had a PCN reaction causing immediate rash, facial/tongue/throat swelling, SOB or lightheadedness with hypotension: No HAS PT DEVELOPED SEVERE RASH INVOLVING MUCUS MEMBRANES or SKIN NECROSIS: #  #  #  YES  #  #  #   Has patient had a PCN reaction that required hospitalization: No Has patient had a PCN reaction occurring within the last 10 years: No If all of the above answers are "NO", then may proceed with Cephalosporin use. Has patient had a PCN reaction causing immediate rash, facial/tongue/throat swelling, SOB or lightheadedness with hypotension: No HAS PT DEVELOPED SEVERE RASH INVOLVING MUCUS MEMBRANES or SKIN NECROSIS: #  #  #  YES  #  #  #   Has patient had a PCN reaction  that required hospitalization: No Has patient had a PCN reaction occurring within the last 10 years: No If all of the above answers are "NO", then may proceed with Cephalosporin use.   Sulfa Antibiotics Hives   Sulfonamide Derivatives Hives   Other Other (See Comments)    Redness, rash and blister   Adhesive [Tape] Other (See Comments)    Redness       The results of significant diagnostics from this hospitalization (including imaging, microbiology, ancillary and laboratory) are listed below for reference.    Significant Diagnostic Studies: CT Head Wo Contrast  Result Date: 01/18/2021 CLINICAL DATA:  Seizure EXAM: CT HEAD WITHOUT CONTRAST TECHNIQUE: Contiguous axial images were obtained from the base of the skull through the vertex without intravenous contrast. COMPARISON:  08/13/2020 FINDINGS: Brain: New communicating hydrocephalus. No visible mass, hemorrhage, or infarct. Diffusely effaced  cerebellar folia. 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 No evidence of leptomeningeal disease on recent enhanced brain MRI, question signs of meningitis. Recommend repeat brain MRI with contrast. Electronically Signed   By: Jorje Guild M.D.   On: 01/18/2021 10:02   MR ANGIO HEAD WO CONTRAST  Result Date: 12/26/2020 CLINICAL DATA:  Cerebral aneurysm follow-up EXAM: MRA HEAD WITHOUT CONTRAST TECHNIQUE: Angiographic images of the Circle of Willis were acquired using MRA technique without intravenous contrast. COMPARISON:  Brain MRI 12/13/2020 FINDINGS: Anterior circulation: Two 2 mm aneurysms separately projecting anteriorly and superiorly from the anterior communicating artery. CTA or catheter angiography would provide better resolution. No branch occlusion, beading, or flow limiting stenosis. Posterior circulation: Vessels are smooth and diffusely patent with no evidence of aneurysm. Anatomic variants: Hypoplastic right A1 segment. Fetal type left PCA. 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 Electronically Signed   By: Jorje Guild M.D.   On: 12/26/2020 10:24   MR BRAIN WO CONTRAST  Result Date: 01/18/2021 CLINICAL DATA:  Seizure.  Metastatic breast cancer. EXAM: MRI HEAD WITHOUT CONTRAST TECHNIQUE: Multiplanar, multiecho pulse sequences of the brain and surrounding structures were obtained without intravenous contrast. COMPARISON:  CT head without contrast 01/18/2021. MR head without contrast 12/13/2020 FINDINGS: Brain: Communicating hydrocephalus is now present. Ventricles are diffusely dilated. Periventricular T2 hyperintensities are now present. No acute cortical infarct or hemorrhage is present. No focal mass lesion is evident. The internal auditory canals are within normal limits. The brainstem and cerebellum are within normal limits. Vascular: Flow is present in the major  intracranial arteries. Skull and upper cervical spine: Signal loss at C2 and C3 are consistent with metastatic disease. The craniocervical junction is within normal limits. Sinuses/Orbits: The paranasal sinuses and mastoid air cells are clear. The globes and orbits are within normal limits. IMPRESSION: 1. New communicating hydrocephalus. 2. Periventricular T2 hyperintensities are now present. This likely reflects the sequela of chronic microvascular ischemia. 3. No acute cortical infarct. 4. No evidence for metastatic disease.  Contrast was not given. 5. Signal loss at C2 and C3 consistent with metastatic disease to the bone. Electronically Signed   By: San Morelle M.D.   On: 01/18/2021 15:16   MR CERVICAL SPINE WO CONTRAST  Result Date: 01/18/2021 CLINICAL DATA:  Metastatic breast cancer. EXAM: MRI CERVICAL SPINE WITHOUT CONTRAST TECHNIQUE: Multiplanar, multisequence MR imaging of the cervical spine was performed. No intravenous contrast was administered. COMPARISON:  PET-CT 08/22/2020 FINDINGS: Alignment: Normal Vertebrae: Diffuse metastatic disease throughout the cervical spine as demonstrated on prior PET-CT. Innumerable lesions involving all of the cervical  and upper thoracic vertebral bodies. I do not see any MR findings suspicious for pathologic fracture in the cervical spine. No epidural tumor. No obvious spinal cord involvement. Cord: Normal cord signal intensity.  No cord lesions or syrinx. Posterior Fossa, vertebral arteries, paraspinal tissues: No significant findings. Disc levels: No disc protrusions, significant spinal or foraminal stenosis in the cervical spine. IMPRESSION: 1. Diffuse metastatic disease throughout the cervical spine. No pathologic fracture. 2. No epidural tumor or spinal cord involvement. 3. No disc protrusions, significant spinal or foraminal stenosis in the cervical spine. Electronically Signed   By: Marijo Sanes M.D.   On: 01/18/2021 16:15   MR THORACIC SPINE WO  CONTRAST  Result Date: 01/18/2021 CLINICAL DATA:  Seizure.  Metastatic breast cancer. EXAM: MRI THORACIC SPINE WITHOUT CONTRAST TECHNIQUE: Multiplanar, multisequence MR imaging of the thoracic spine was performed. No intravenous contrast was administered. COMPARISON:  CT of the chest and abdomen 11/18/2020 FINDINGS: Alignment: No significant listhesis is present. Thoracic kyphosis is preserved. Vertebrae: Diffuse marrow signal changes are present with metastatic lesions I would every site. No pathologic fractures are present. No significant extraosseous tumor is present. Cord:  Normal signal and morphology. Paraspinal and other soft tissues: A right pleural effusion is present. Paraspinous soft tissues are unremarkable. Visualized upper abdomen is within normal limits. Disc levels: No significant disc disease or stenosis is present at T6-7 or above. T7-8: Left greater than right disc protrusions are noted. Moderate left foraminal narrowing is present. T8-9: A left paramedian disc extrusion extends slightly superiorly. Partial effacement of ventral CSF noted without significant stenosis. T9-10: Negative. T10-11: Negative. T11-12: Negative. T12-L1: Negative. IMPRESSION: 1. Diffuse marrow signal changes compatible with widespread metastatic disease. 2. No pathologic fractures or significant extraosseous tumor present. 3. Moderate left foraminal stenosis at T7-8 secondary to left greater than right disc protrusions. 4. Left paramedian disc extrusion at T8-9 without significant stenosis. Electronically Signed   By: San Morelle M.D.   On: 01/18/2021 15:41   MR LUMBAR SPINE WO CONTRAST  Result Date: 01/18/2021 CLINICAL DATA:  Metastatic breast cancer. The examination had to be discontinued prior to completion due to patient refusal for further imaging. Patient motion. EXAM: MRI LUMBAR SPINE WITHOUT CONTRAST TECHNIQUE: Multiplanar, multisequence MR imaging of the lumbar spine was performed. No intravenous  contrast was administered. COMPARISON:  CT of the abdomen pelvis 11/18/2020. PET scan 08/22/2020. FINDINGS: Segmentation: 5 non rib-bearing lumbar type vertebral bodies are present. The lowest fully formed vertebral body is L5. Alignment: Slight retrolisthesis present at L2-3 and L3-4. No other significant listhesis is present. Straightening of the normal lumbar lordosis noted. Vertebrae: Diffuse heterogeneous marrow signal is consistent with extensive osseous metastases. No pathologic fracture or extraosseous tumor is present. Conus medullaris and cauda equina: Conus extends to the L1 level. Conus and cauda equina appear normal. Paraspinal and other soft tissues: Paraspinous soft tissues are within normal limits on sagittal images. Disc levels: L1-2: Negative. L2-3: A mild broad-based disc bulge is present without focal stenosis. L3-4: A broad-based disc protrusion is present. Disc material extends into the foramina bilaterally. Mild bilateral foraminal narrowing is worse on the right. L4-5: A broad-based disc protrusion is present. Disc material extends into the foramina bilaterally. Mild left foraminal narrowing is present. L5-S1: Negative. IMPRESSION: 1. Diffuse osseous metastases. 2. No evidence of pathologic fracture or extraosseous tumor. 3. Mild bilateral foraminal narrowing at L3-4 is worse on the right. 4. Mild left foraminal narrowing at L4-5. Electronically Signed   By: San Morelle  M.D.   On: 01/18/2021 16:37   DG Chest Port 1 View  Result Date: 01/18/2021 CLINICAL DATA:  Seizure EXAM: PORTABLE CHEST 1 VIEW COMPARISON:  08/14/2018 FINDINGS: Port in the anterior chest wall with tip in distal SVC. Normal cardiac silhouette. Mild linear interstitial markings within LEFT and RIGHT lung. No pneumothorax. No focal consolidation No acute osseous abnormality. IMPRESSION: Interstitial edema pattern Electronically Signed   By: Suzy Bouchard M.D.   On: 01/18/2021 08:44    Microbiology: Recent  Results (from the past 240 hour(s))  CSF culture w Gram Stain     Status: None   Collection Time: 01/14/21  2:30 PM   Specimen: CSF  Result Value Ref Range Status   Specimen Description   Final    CSF Performed at [facility], [place] [address], [place], [place] [address]    Special Requests   Final    NONE Performed at [facility], [place] [address], [place], [place] [address]    Gram Stain   Final    WBC PRESENT, PREDOMINANTLY MONONUCLEAR NO ORGANISMS SEEN CYTOSPIN SMEAR Gram Stain Report Called to,Read Back By and Verified With: K.DESOTA, RN AT 4081 ON 10.25.22 BY N.THOMPSON Performed at [facility] [address] [place], [place] [address]    Culture   Final    NO GROWTH 3 DAYS Performed at [facility], [place] [address], [place], [place] [address]    Report Status 01/18/2021 FINAL  Final  Resp Panel by RT-PCR (Flu A&B, Covid) Nasopharyngeal Swab     Status: None   Collection Time: 01/18/21  7:34 AM   Specimen: Nasopharyngeal Swab; Nasopharyngeal(NP) swabs in vial transport medium  Result Value Ref Range Status   SARS Coronavirus 2 by RT PCR NEGATIVE NEGATIVE Final    Comment: (NOTE) SARS-CoV-2 target nucleic acids are NOT DETECTED.  The SARS-CoV-2 RNA is generally detectable in upper respiratory specimens during the acute phase of infection. The lowest concentration of SARS-CoV-2 viral copies this assay can detect is 138 copies/mL. A negative result does not preclude SARS-Cov-2 infection and should not be used as the sole basis for treatment or other patient management decisions. A negative result may occur with  improper specimen collection/handling, submission of specimen other than nasopharyngeal swab, presence of viral mutation(s) within the areas targeted by this assay, and inadequate number of viral copies(<138 copies/mL). A negative result must be combined with clinical observations, patient history,  and epidemiological information. The expected result is Negative.  Fact Sheet for Patients:  EntrepreneurPulse.com.au  Fact Sheet for Healthcare Providers:  IncredibleEmployment.be  This test is no t yet approved or cleared by the [place] FDA and  has been authorized for detection and/or diagnosis of SARS-CoV-2 by FDA under an Emergency Use Authorization (EUA). This EUA will remain  in effect (meaning this test can be used) for the duration of the COVID-19 declaration under Section 564(b)(1) of the Act, 21 U.S.C.section 360bbb-3(b)(1), unless the authorization is terminated  or revoked sooner.       Influenza A by PCR NEGATIVE NEGATIVE Final   Influenza B by PCR NEGATIVE NEGATIVE Final    Comment: (NOTE) The Xpert Xpress SARS-CoV-2/FLU/RSV plus assay is intended as an aid in the diagnosis of influenza from Nasopharyngeal swab specimens and should not be used as a sole basis for treatment. Nasal washings and aspirates are unacceptable for Xpert Xpress SARS-CoV-2/FLU/RSV testing.  Fact Sheet for Patients: EntrepreneurPulse.com.au  Fact Sheet for Healthcare Providers:  IncredibleEmployment.be  This test is not yet approved or cleared by the [place] and has been authorized for detection and/or diagnosis of SARS-CoV-2 by FDA under an Emergency Use Authorization (EUA). This EUA will remain in effect (meaning this test can be used) for the duration of the COVID-19 declaration under Section 564(b)(1) of the Act, 21 U.S.C. section 360bbb-3(b)(1), unless the authorization is terminated or revoked.  Performed at [facility], [place] [address] [place], [place] [address]      Labs: Basic Metabolic Panel: Recent Labs  Lab 01/16/21 1423 01/18/21 0731 01/18/21 1650 01/19/21 0300  NA 145 142  --  142  K 3.1* 2.5* 3.0* 3.4*  CL 106 106  --  111  CO2 15* 21*  --  16*  GLUCOSE 149* 159*   --  58*  BUN 14 13  --  9  CREATININE 0.72 0.82  --  0.58  CALCIUM 8.8* 7.8*  --  7.2*  MG  --   --  2.3  --    Liver Function Tests: Recent Labs  Lab 01/16/21 1423 01/18/21 0731  AST 53* 49*  ALT 48* 36  ALKPHOS 94 73  BILITOT 0.5 1.2  PROT 7.3 6.4*  ALBUMIN 3.6 3.2*   No results for input(s): LIPASE, AMYLASE in the last 168 hours. No results for input(s): AMMONIA in the last 168 hours. CBC: Recent Labs  Lab 01/16/21 1423 01/18/21 0731  WBC 9.3 9.4  NEUTROABS 8.1* 8.4*  HGB 13.1 12.6  HCT 39.6 38.5  MCV 90.4 93.0  PLT 354 296   Cardiac Enzymes: No results for input(s): CKTOTAL, CKMB, CKMBINDEX, TROPONINI in the last 168 hours. BNP: BNP (last 3 results) No results for input(s): BNP in the last 8760 hours.  ProBNP (last 3 results) No results for input(s): PROBNP in the last 8760 hours.  CBG: Recent Labs  Lab 01/20/21 0010 01/20/21 0337 01/20/21 0753 01/20/21 1258 01/20/21 1652  GLUCAP 131* 139* 134* 118* 122*       Signed:  Domenic Polite MD.  [facility] 01/21/2021, 3:04 PM

## 2021-01-21 NOTE — Telephone Encounter (Signed)
OK 

## 2021-01-22 ENCOUNTER — Telehealth: Payer: Self-pay

## 2021-01-22 ENCOUNTER — Encounter: Payer: Self-pay | Admitting: Internal Medicine

## 2021-01-22 ENCOUNTER — Encounter (HOSPITAL_COMMUNITY): Payer: Self-pay | Admitting: Physician Assistant

## 2021-01-22 ENCOUNTER — Other Ambulatory Visit: Payer: BC Managed Care – PPO

## 2021-01-22 ENCOUNTER — Ambulatory Visit: Payer: BC Managed Care – PPO | Admitting: Hematology and Oncology

## 2021-01-22 ENCOUNTER — Ambulatory Visit: Payer: BC Managed Care – PPO

## 2021-01-22 NOTE — Telephone Encounter (Signed)
Transition Care Management Unsuccessful Follow-up Telephone Call  Date of discharge and from where:  01/20/21 from [facility]  Attempts:  2nd Attempt  Reason for unsuccessful TCM follow-up call:  Left voice message

## 2021-01-22 NOTE — Telephone Encounter (Signed)
Transition Care Management Unsuccessful Follow-up Telephone Call  Date of discharge and from where:  01/20/21 from [facility]  Attempts:  1st Attempt  Reason for unsuccessful TCM follow-up call:  Left voice message

## 2021-01-22 NOTE — Telephone Encounter (Signed)
I spoke with Horris Latino at [facility] and informed her of the message below.

## 2021-01-23 ENCOUNTER — Inpatient Hospital Stay: Admit: 2021-01-23 | Payer: BC Managed Care – PPO | Admitting: Neurosurgery

## 2021-01-23 SURGERY — CHEMO RESERVOIR INSERTION
Anesthesia: General

## 2021-01-29 ENCOUNTER — Telehealth: Payer: Self-pay | Admitting: Internal Medicine

## 2021-01-29 NOTE — Telephone Encounter (Signed)
Jarrett Soho from [employer] called to see if Dr.Panosh would sign patient's death certificate or if it needed to be sign by the oncologist. If Dr.Panosh cannot sign it, she will need to relinquish herself so it can be reassigned to oncologist.     Good callback number [phone]   Please advise

## 2021-01-29 NOTE — Telephone Encounter (Signed)
I signed the death certificate on  02/13/23 on the [facility] website . Please advise   if it did not go through. And certainly will sign.

## 2021-01-30 NOTE — Telephone Encounter (Signed)
Jarrett Soho informed that death certificate has been signed by PCP

## 2021-02-04 ENCOUNTER — Encounter (HOSPITAL_COMMUNITY): Payer: BC Managed Care – PPO | Admitting: Internal Medicine

## 2021-02-04 ENCOUNTER — Ambulatory Visit (HOSPITAL_COMMUNITY): Payer: BC Managed Care – PPO

## 2021-02-20 DEATH — deceased

## 2021-03-01 ENCOUNTER — Other Ambulatory Visit: Payer: Self-pay | Admitting: Internal Medicine

## 2022-09-03 IMAGING — PT NM PET TUM IMG INITIAL (PI) SKULL BASE T - THIGH
7 series · 25 of 25 positions shown · non-contrast
Comparison: Outside imaging of the head and C-spine. Prior PET exam
from 6375

CLINICAL DATA: Subsequent treatment strategy for breast cancer,
recent fall. History of LEFT mastectomy in 6375 subsequent diagnosis
of RIGHT breast cancer and RIGHT mastectomy with systemic therapy
and adjuvant radiation with recent concern for metastatic disease.

EXAM:
NUCLEAR MEDICINE PET SKULL BASE TO THIGH
TECHNIQUE: 7.47 mCi F-18 FDG was injected intravenously. Full-ring PET imaging
was performed from the skull base to thigh after the radiotracer. CT
data was obtained and used for attenuation correction and anatomic
localization.
Fasting blood glucose: 108 mg/dl

[Series 3: pet sk_thigh ac · axial · 5.0mm · 4.07mm/px · z∈[-1232,-276]mm · 6 of 240 slices shown]
[im 1/240]
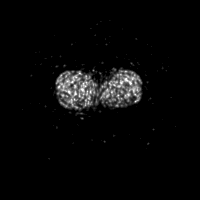
[im 48/240]
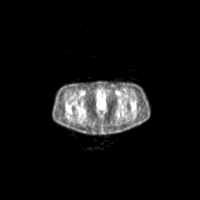
[im 96/240]
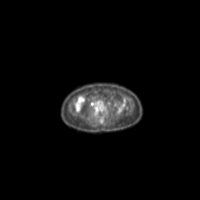
[im 144/240]
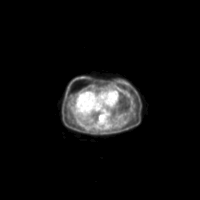
[im 192/240]
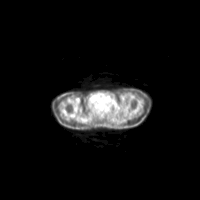
[im 240/240]
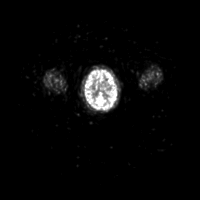

[Series 4: ct sk_thigh 5.0 bf37 · axial · 5.0mm · 0.98mm/px · z∈[-1232,-276]mm · 6 of 240 slices shown]
[im 1/240]
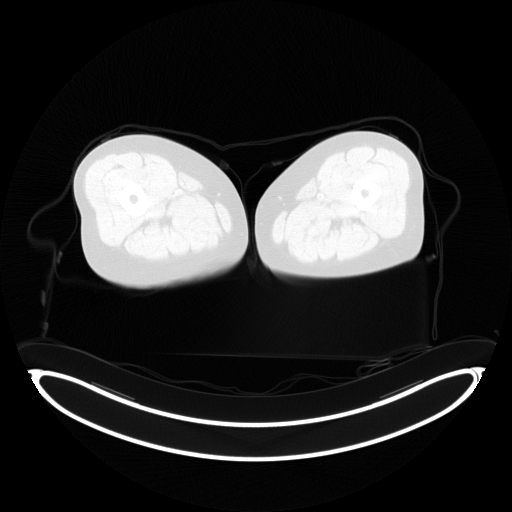
[im 48/240]
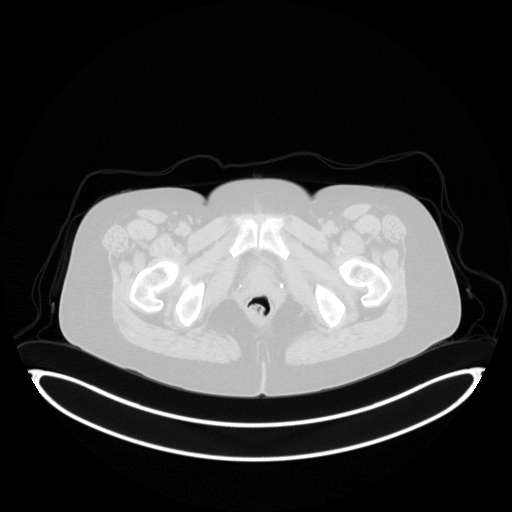
[im 96/240]
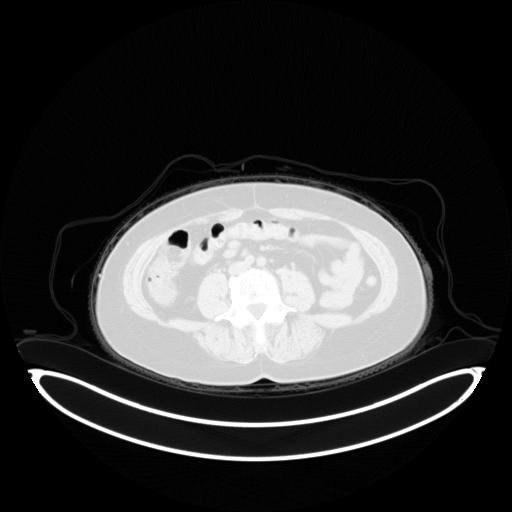
[im 144/240]
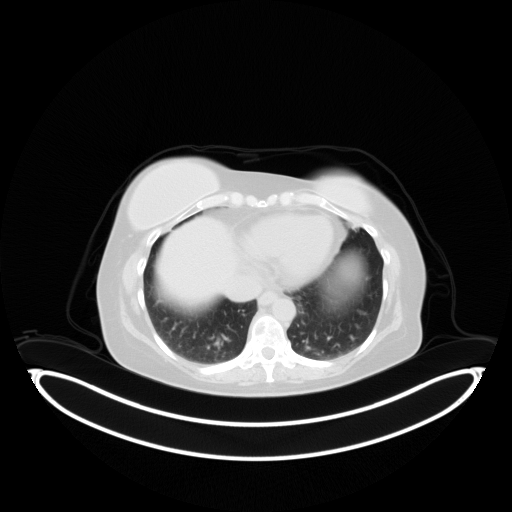
[im 192/240]
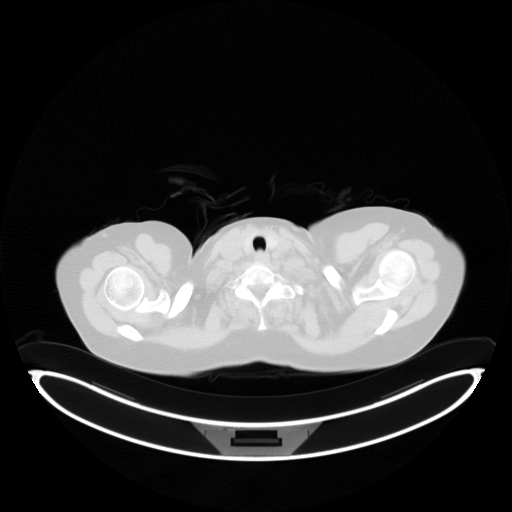
[im 240/240  brain]
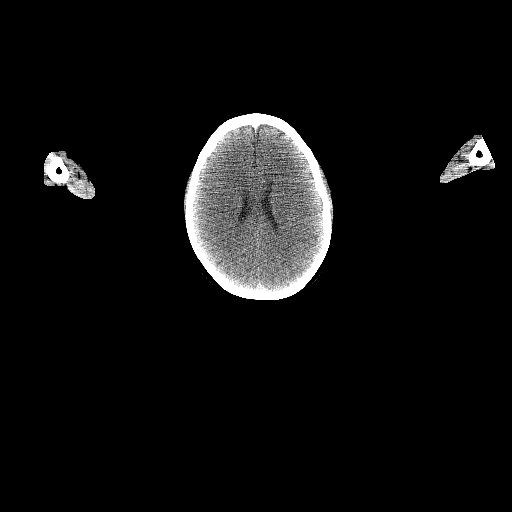

[Series 5: pet sk_thigh nac · axial · 5.0mm · 4.07mm/px · z∈[-1232,-276]mm · 5 of 240 slices shown]
[im 1/240]
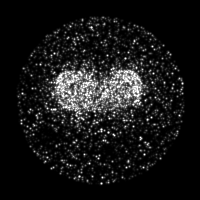
[im 60/240]
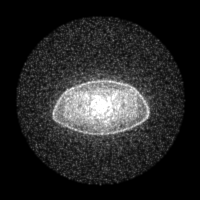
[im 120/240]
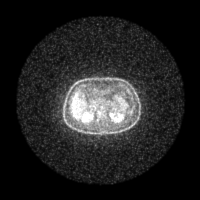
[im 180/240]
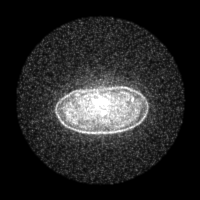
[im 240/240]
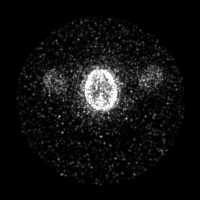

[Series 8: ct sk_thigh 5.0 br59 lung_bone · axial · 5.0mm · 0.69mm/px · 1 of 62 slices shown]
[im 1/62]
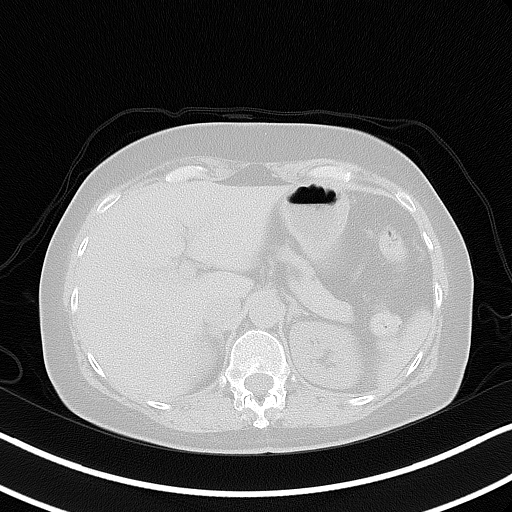

[Series 603: <mip collection> · coronal · 1.98mm/px · 1 of 32 slices shown]
[im 1/32]
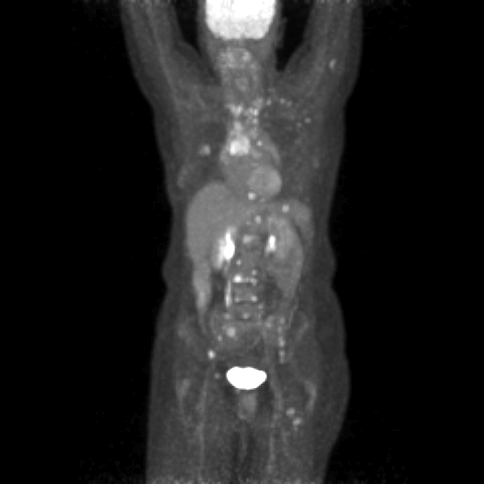

[Series 604: fused cor · 1 of 50 slices shown]
[im 1/50]
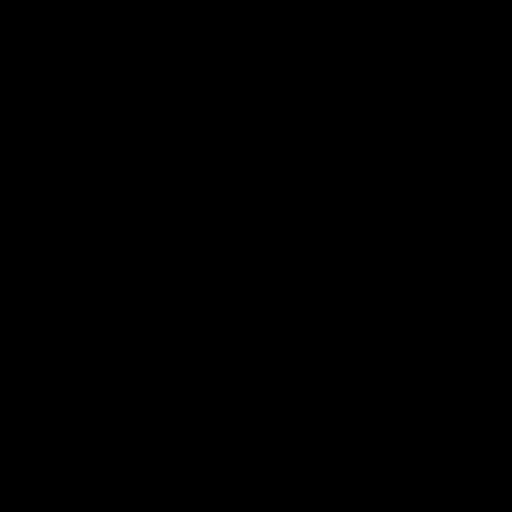

[Series 605: range-ct sk_thigh 5.0 bf37-tra-<alpha range> · 5 of 233 slices shown]
[im 1/233]
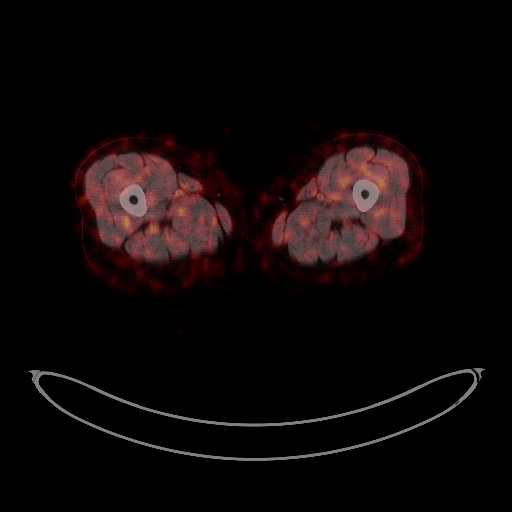
[im 59/233]
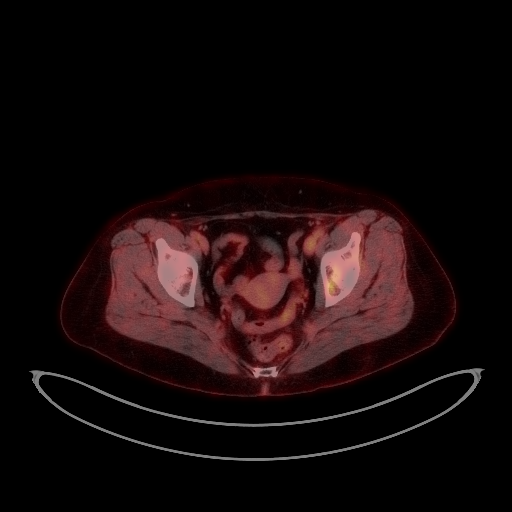
[im 117/233]
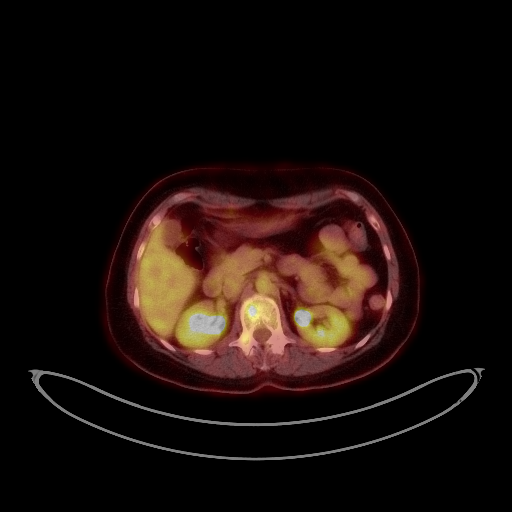
[im 175/233]
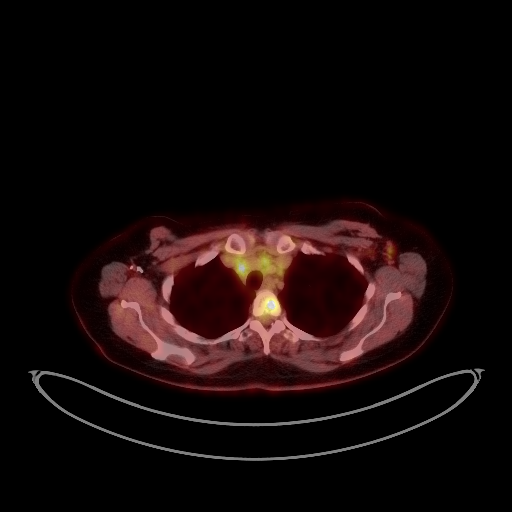
[im 233/233]
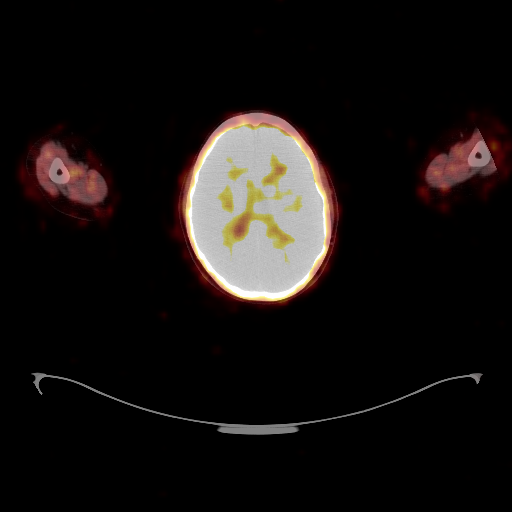

[25 of 25 positions shown; findings below may reference images not displayed]

FINDINGS: Mediastinal blood pool activity: SUV max

Liver activity: SUV max NA

NECK: RIGHT and LEFT level IV and VB lymph nodes with increased
metabolic activity. Also with small lymph nodes tracking along
cervical nodal chains greater on the RIGHT than the LEFT with a
small RIGHT level II/III lymph node (image 33/4) with maximum SUV of
4.1 measuring 8 mm.

Thoracic inlet nodes on the RIGHT at IV (image 54/4) 2 adjacent
lymph nodes measuring as much as 1.5 cm with a maximum SUV of
approximately 0.6.

[facility] numeral level for/thoracic inlet lymph nodes (image 51/4)
a cluster of small nodes largest approximately a cm with a maximum
SUV of

Small supraclavicular nodes on the LEFT and high axillary nodes as
well less than a cm with mild increased FDG uptake.

Incidental CT findings: None

CHEST: Bulky area in the anterior mediastinum measuring 1.9 x 4.3 cm
with a maximum SUV of 13 (image 74/4)

Subcarinal nodal tissue (image 73/4) approximately 1.6 cm short axis
with a maximum SUV of 7.4 tracking into the adjacent precarinal and
RIGHT paratracheal chains with increased metabolic activity tracking
along the RIGHT paratracheal chain.

Signs of increased metabolic activity about the RIGHT hilum

Incidental CT findings:

Heart great vessels unremarkable on noncontrast CT. Esophagus mildly
patulous otherwise normal.

Signs of bilateral mastectomy and RIGHT axillary dissection.

Subpleural area of consolidative change in the anterior RIGHT chest
deep to the chest wall appears very similar to previous imaging
displaying mild FDG uptake with a maximum SUV of 4.6. Other areas of
subpleural ground-glass and a focal area of nodularity are unchanged
since 8499 likely related to prior therapy. Small fissural nodule in
the lower LEFT chest (image [DATE]) not associated with increased FDG
uptake and likely present on previous imaging may represent a small
subpleural lymph node. Airways are patent. Mild air trapping at the
lung bases versus atelectasis.

ABDOMEN/PELVIS: No abnormal hypermetabolic activity within the
liver, pancreas, adrenal glands, or spleen. No hypermetabolic lymph
nodes in the abdomen or pelvis.

Incidental CT findings: none

SKELETON: Sites of multifocal bony metastatic disease.

(Image [DATE]) lucent lesion in C2 with associated increased metabolic
activity measures approximately 4 mm to 5 mm size with a maximum SUV
of 6.0.

(Image 55/4) lytic lesion in the posterior aspect of the T2
vertebral body measuring approximately 8 mm with a maximum SUV of
8.8. Smaller lesion in the contralateral T2 vertebral body
compatible with metastasis as well.

Subjacent T3 vertebral body (image 61/4) 9 mm lesion with similar
degree of metabolic activity.

Scattered additional lesions in the thoracic spine.

(Image 98/4) T10 lesion measuring 1.4 cm with a maximum SUV of 8.5.

Smaller T12 lesion also with some metabolic activity on image 106 of
series 4.

(Image 117/4) 15 mm L1 lesion also hypermetabolic with a maximum SUV
of 6.7. Numerous additional foci of lytic change, smaller areas in
the spine. Posterior element involvement at T11.

Bilateral pelvic involvement and involvement of the LEFT proximal
femur.

RIGHT hemi sacrum, sacral ala with lytic lesion measuring 1.5 cm
showing a maximum SUV of 6.6. Smaller bilateral iliac lesions in
posterior iliac and along the inferior iliac just above the
acetabulum bilaterally. LEFT femoral lesions involving lesser
trochanter and proximal LEFT femoral medullary cavity.

Incidental CT findings: Fracture of LEFT anterior sixth rib may be
posttraumatic or pathologic.
IMPRESSION: Widespread FDG avid nodal and bony disease suspicious for
metastasis. The possibility of lymphoproliferative disorder is
considered given the more confluent masslike appearance of anterior
mediastinal soft tissue. Biopsy may be helpful. Thoracic inlet lymph
nodes may be readily accessible for tissue sampling as warranted.

Consolidative changes and parenchymal distortion in the anterior
RIGHT chest related to prior radiotherapy.

Postoperative changes of bilateral mastectomy and axillary
dissection.

## 2022-09-14 IMAGING — US US BIOPSY FNA W/ IMAGING
1 series · 15 of 17 positions shown · non-contrast
Comparison: none

INDICATION: 55-year-old woman with history of breast cancer presents to
interventional radiology for biopsy of enlarged neck lymph nodes.

[Series 1: us fna 1st lesion mc & wl · 15 of 17 slices shown]
[im 1/17]
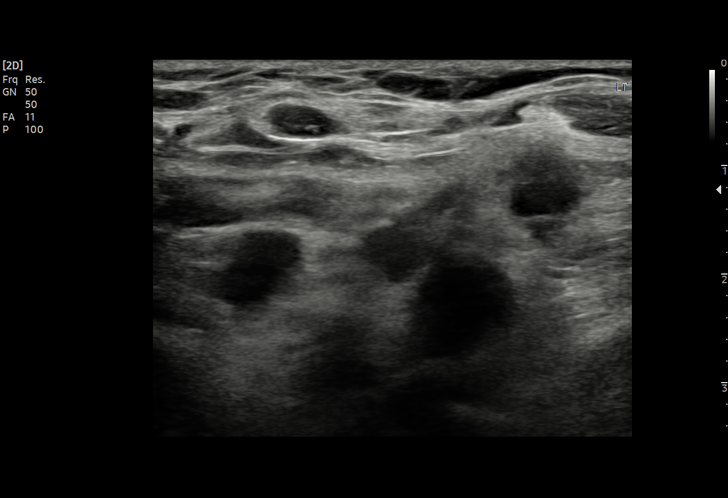
[im 2/17]
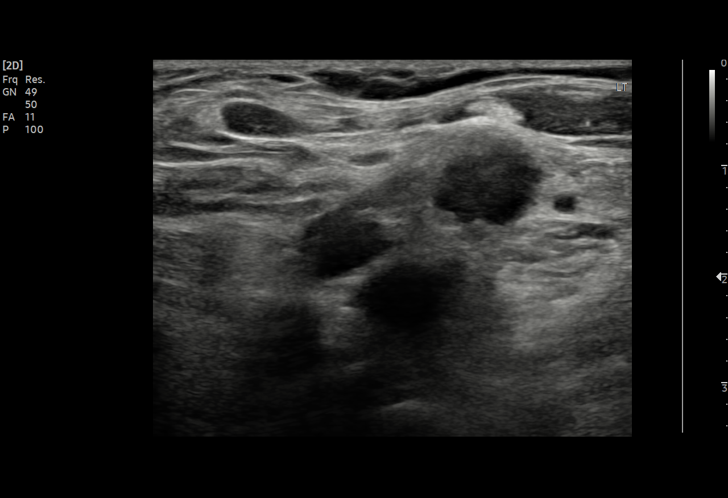
[im 3/17]
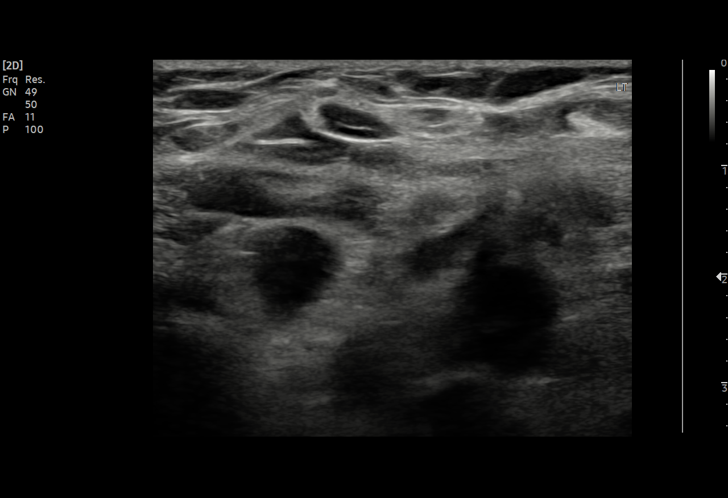
[im 4/17]
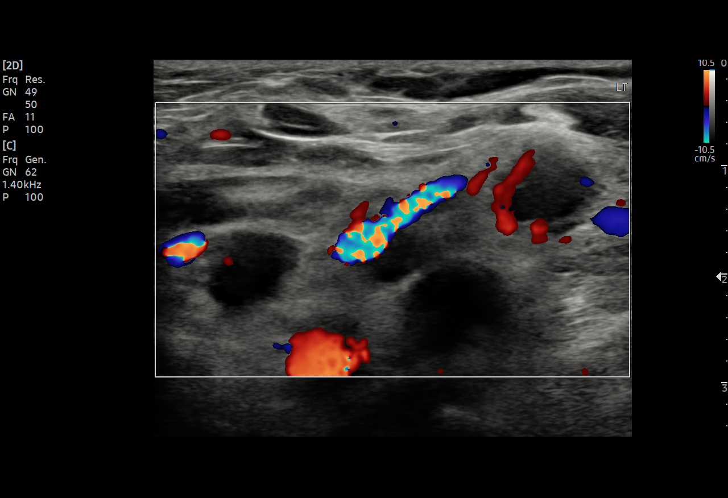
[im 6/17]
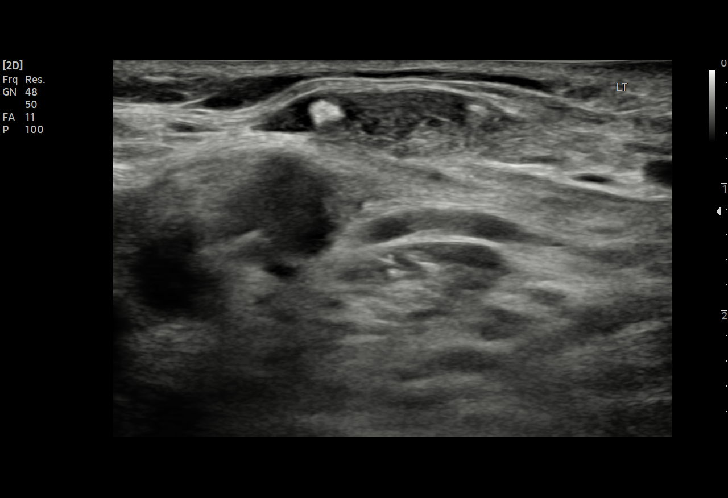
[im 7/17]
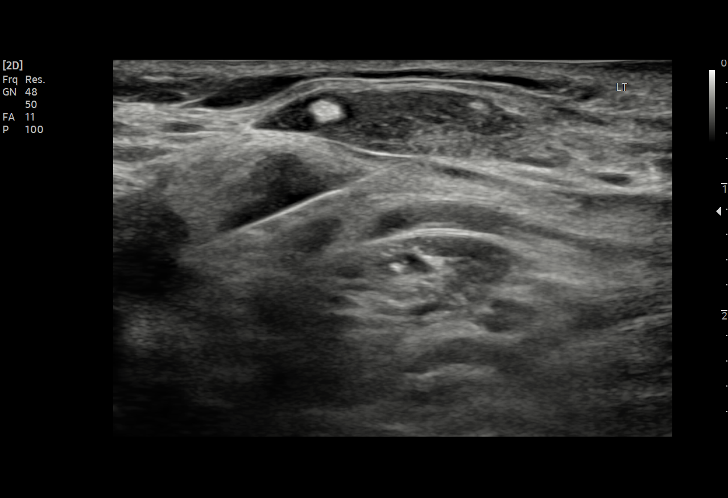
[im 8/17]
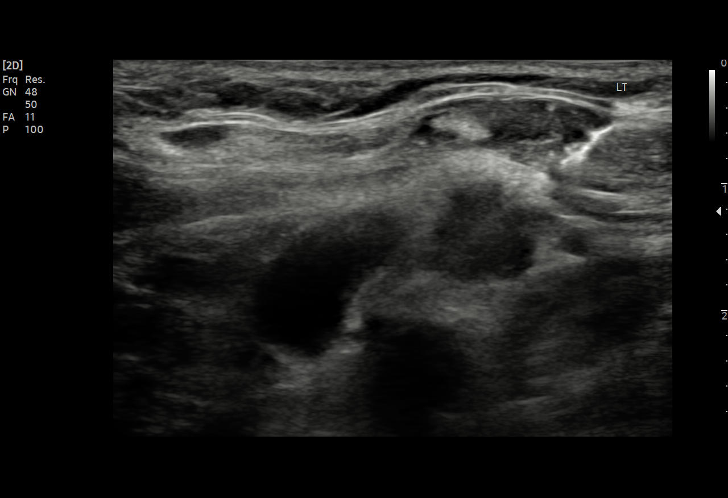
[im 9/17]
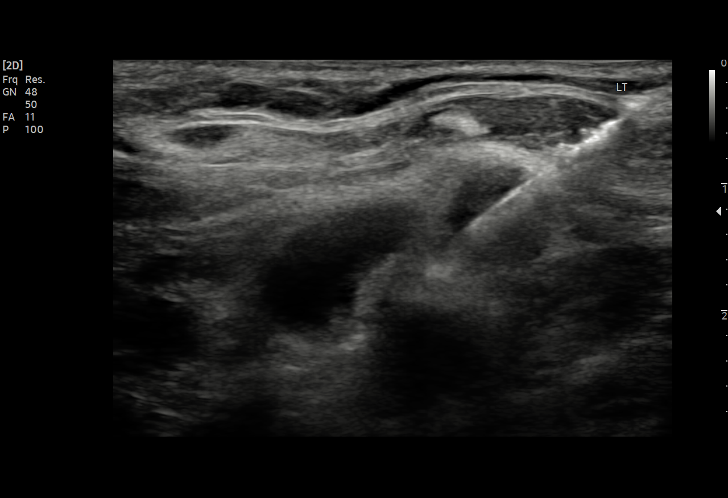
[im 10/17]
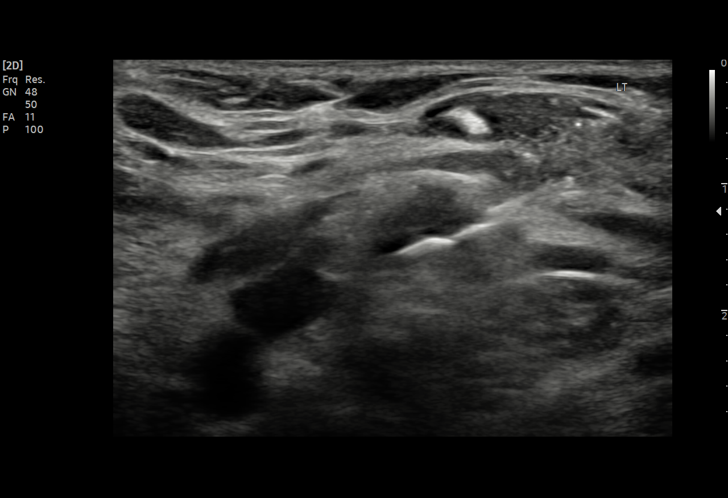
[im 11/17]
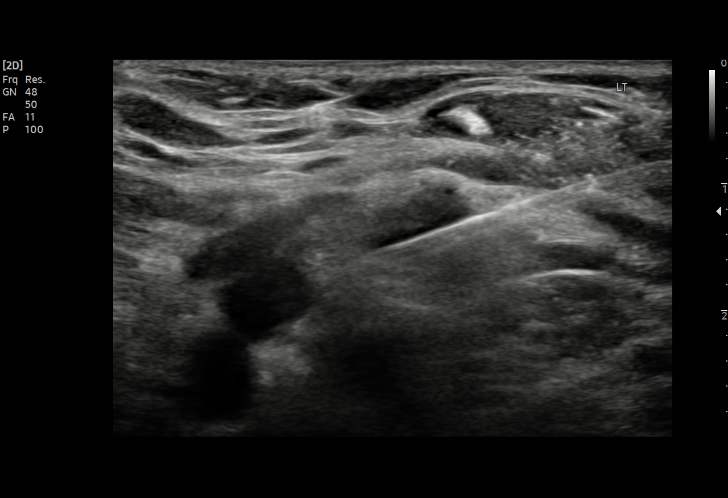
[im 12/17]
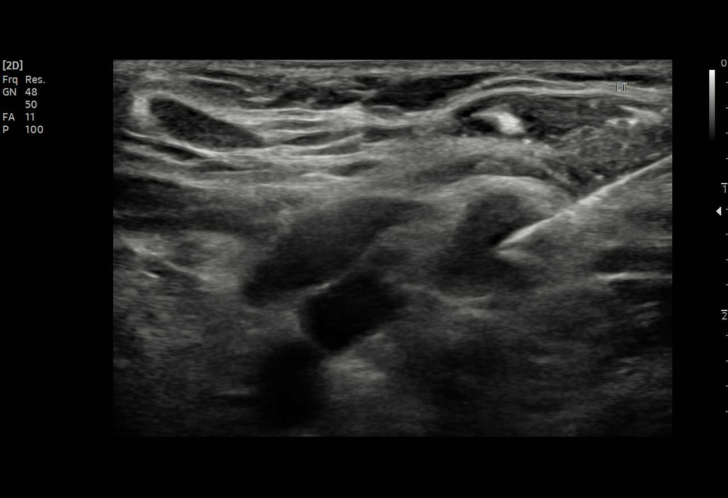
[im 14/17]
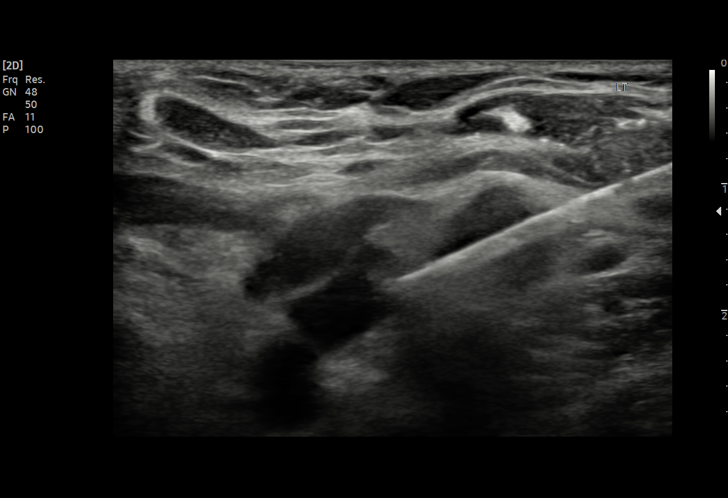
[im 15/17]
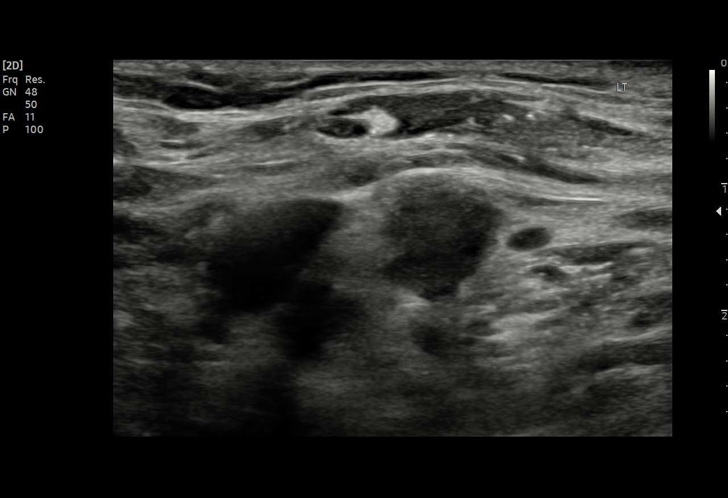
[im 16/17]
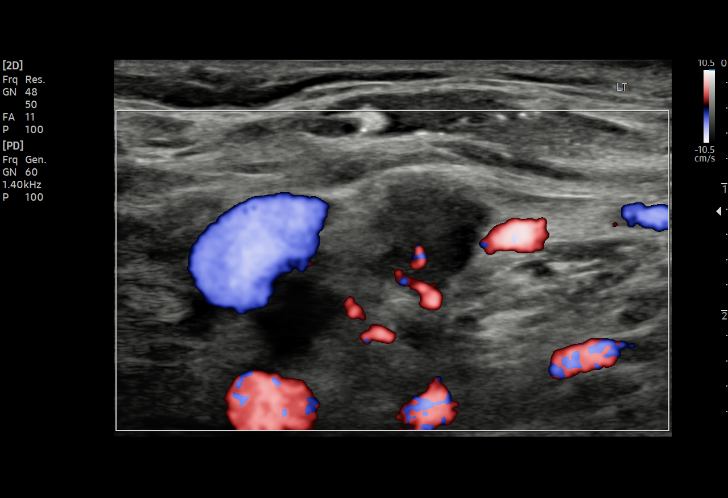
[im 17/17]
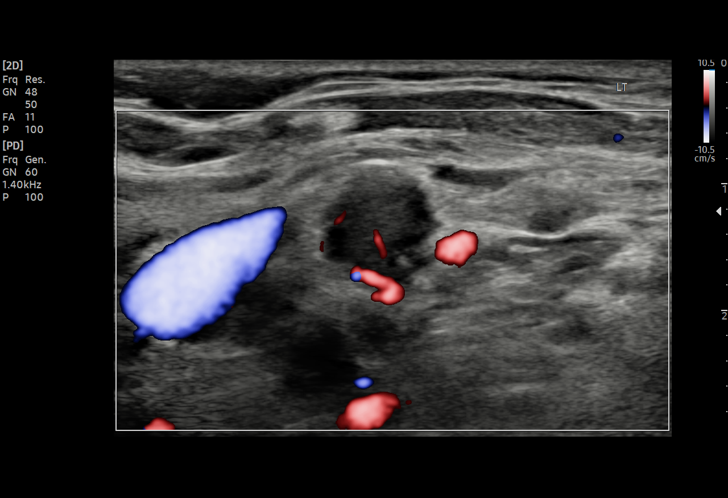

[15 of 17 positions shown; findings below may reference images not displayed]

EXAM:
Ultrasound-guided biopsy of enlarged left neck lymph node

MEDICATIONS:
None.

ANESTHESIA/SEDATION:
Moderate (conscious) sedation was employed during this procedure. A
total of Versed 1 mg and Fentanyl 50 mcg was administered
intravenously.

Moderate Sedation Time: 10 minutes. The patient's level of
consciousness and vital signs were monitored continuously by
radiology nursing throughout the procedure under my direct
supervision.

COMPLICATIONS:
None immediate.

PROCEDURE:
Informed written consent was obtained from the patient after a
thorough discussion of the procedural risks, benefits and
alternatives. All questions were addressed. Maximal Sterile Barrier
Technique was utilized including caps, mask, sterile gowns, sterile
gloves, sterile drape, hand hygiene and skin antiseptic. A timeout
was performed prior to the initiation of the procedure.

Patient position supine on the ultrasound table.

Evaluation of the neck demonstrated more easily accessible enlarged
left neck lymph nodes.

Left neck skin prepped and draped in usual sterile fashion.

Following local lidocaine administration, four 18 gauge cores were
obtained from the enlarged left neck utilizing continuous ultrasound
guidance.

Samples were sent to pathology in sterile saline.

Needle removed and hemostasis achieved with 2 minutes of manual
compression.

Post procedure ultrasound images showed no evidence of significant
hemorrhage.
IMPRESSION: Ultrasound-guided biopsy of enlarged left neck lymph node as above.
